# Patient Record
Sex: Female | Born: 1950 | Race: White | Hispanic: No | Smoking: Former smoker
Health system: Southern US, Community
[De-identification: ages and names within clinical notes are randomized; demographics above are authoritative.]

## PROBLEM LIST (undated history)

## (undated) DIAGNOSIS — R51 Headache: Secondary | ICD-10-CM

## (undated) DIAGNOSIS — I10 Essential (primary) hypertension: Secondary | ICD-10-CM

## (undated) DIAGNOSIS — B958 Unspecified staphylococcus as the cause of diseases classified elsewhere: Secondary | ICD-10-CM

## (undated) DIAGNOSIS — G2581 Restless legs syndrome: Secondary | ICD-10-CM

## (undated) DIAGNOSIS — R011 Cardiac murmur, unspecified: Secondary | ICD-10-CM

## (undated) DIAGNOSIS — E1142 Type 2 diabetes mellitus with diabetic polyneuropathy: Secondary | ICD-10-CM

## (undated) DIAGNOSIS — E119 Type 2 diabetes mellitus without complications: Secondary | ICD-10-CM

## (undated) DIAGNOSIS — J45909 Unspecified asthma, uncomplicated: Secondary | ICD-10-CM

## (undated) DIAGNOSIS — E785 Hyperlipidemia, unspecified: Secondary | ICD-10-CM

## (undated) DIAGNOSIS — R519 Headache, unspecified: Secondary | ICD-10-CM

## (undated) DIAGNOSIS — I2510 Atherosclerotic heart disease of native coronary artery without angina pectoris: Secondary | ICD-10-CM

## (undated) DIAGNOSIS — M545 Low back pain, unspecified: Secondary | ICD-10-CM

## (undated) DIAGNOSIS — M1990 Unspecified osteoarthritis, unspecified site: Secondary | ICD-10-CM

## (undated) DIAGNOSIS — D649 Anemia, unspecified: Secondary | ICD-10-CM

## (undated) DIAGNOSIS — G8929 Other chronic pain: Secondary | ICD-10-CM

## (undated) DIAGNOSIS — K219 Gastro-esophageal reflux disease without esophagitis: Secondary | ICD-10-CM

## (undated) DIAGNOSIS — K551 Chronic vascular disorders of intestine: Secondary | ICD-10-CM

## (undated) HISTORY — DX: Essential (primary) hypertension: I10

## (undated) HISTORY — PX: LAPAROSCOPIC CHOLECYSTECTOMY: SUR755

## (undated) HISTORY — DX: Hyperlipidemia, unspecified: E78.5

## (undated) HISTORY — PX: TONSILLECTOMY: SUR1361

## (undated) HISTORY — PX: CORONARY ANGIOPLASTY: SHX604

## (undated) HISTORY — PX: FRACTURE SURGERY: SHX138

## (undated) HISTORY — PX: ABDOMINAL HYSTERECTOMY: SHX81

---

## 2003-11-08 ENCOUNTER — Emergency Department (HOSPITAL_COMMUNITY): Admission: EM | Admit: 2003-11-08 | Discharge: 2003-11-08 | Payer: Self-pay

## 2004-11-04 ENCOUNTER — Emergency Department (HOSPITAL_COMMUNITY): Admission: EM | Admit: 2004-11-04 | Discharge: 2004-11-04 | Payer: Self-pay | Admitting: Emergency Medicine

## 2006-12-06 ENCOUNTER — Emergency Department (HOSPITAL_COMMUNITY): Admission: EM | Admit: 2006-12-06 | Discharge: 2006-12-06 | Payer: Self-pay | Admitting: Emergency Medicine

## 2008-01-24 ENCOUNTER — Ambulatory Visit: Payer: Self-pay | Admitting: *Deleted

## 2008-01-24 ENCOUNTER — Ambulatory Visit: Payer: Self-pay | Admitting: Internal Medicine

## 2008-01-30 ENCOUNTER — Ambulatory Visit (HOSPITAL_COMMUNITY): Admission: RE | Admit: 2008-01-30 | Discharge: 2008-01-30 | Payer: Self-pay | Admitting: Family Medicine

## 2008-04-25 ENCOUNTER — Ambulatory Visit: Payer: Self-pay | Admitting: Internal Medicine

## 2008-04-25 LAB — CONVERTED CEMR LAB
AST: 19 units/L (ref 0–37)
Albumin: 4.4 g/dL (ref 3.5–5.2)
Alkaline Phosphatase: 99 units/L (ref 39–117)
BUN: 13 mg/dL (ref 6–23)
Calcium: 9.2 mg/dL (ref 8.4–10.5)
Chloride: 106 meq/L (ref 96–112)
Glucose, Bld: 60 mg/dL — ABNORMAL LOW (ref 70–99)
HDL: 38 mg/dL — ABNORMAL LOW (ref >39)
LDL Cholesterol: 91 mg/dL (ref 0–99)
Potassium: 4.1 meq/L (ref 3.5–5.3)
Sodium: 141 meq/L (ref 135–145)
Total Protein: 6.5 g/dL (ref 6.0–8.3)
Triglycerides: 96 mg/dL (ref <150)

## 2008-05-03 ENCOUNTER — Ambulatory Visit: Payer: Self-pay | Admitting: Internal Medicine

## 2008-07-25 ENCOUNTER — Ambulatory Visit: Payer: Self-pay | Admitting: Internal Medicine

## 2008-09-12 ENCOUNTER — Ambulatory Visit: Payer: Self-pay | Admitting: Internal Medicine

## 2008-10-16 ENCOUNTER — Ambulatory Visit: Payer: Self-pay | Admitting: Internal Medicine

## 2009-01-15 ENCOUNTER — Ambulatory Visit: Payer: Self-pay | Admitting: Internal Medicine

## 2009-02-03 ENCOUNTER — Ambulatory Visit (HOSPITAL_COMMUNITY): Admission: RE | Admit: 2009-02-03 | Discharge: 2009-02-03 | Payer: Self-pay | Admitting: Internal Medicine

## 2009-02-13 ENCOUNTER — Ambulatory Visit: Payer: Self-pay | Admitting: Internal Medicine

## 2009-02-13 LAB — CONVERTED CEMR LAB
AST: 13 units/L (ref 0–37)
Albumin: 4.4 g/dL (ref 3.5–5.2)
Alkaline Phosphatase: 103 units/L (ref 39–117)
Indirect Bilirubin: 0.4 mg/dL (ref 0.0–0.9)
Total Bilirubin: 0.5 mg/dL (ref 0.3–1.2)

## 2009-02-20 ENCOUNTER — Ambulatory Visit (HOSPITAL_COMMUNITY): Admission: RE | Admit: 2009-02-20 | Discharge: 2009-02-20 | Payer: Self-pay | Admitting: Internal Medicine

## 2009-02-20 ENCOUNTER — Encounter: Payer: Self-pay | Admitting: Internal Medicine

## 2009-04-03 ENCOUNTER — Ambulatory Visit: Payer: Self-pay | Admitting: Internal Medicine

## 2009-04-04 ENCOUNTER — Emergency Department (HOSPITAL_COMMUNITY): Admission: EM | Admit: 2009-04-04 | Discharge: 2009-04-04 | Payer: Self-pay | Admitting: Emergency Medicine

## 2009-04-15 ENCOUNTER — Ambulatory Visit: Payer: Self-pay | Admitting: Internal Medicine

## 2009-04-29 ENCOUNTER — Ambulatory Visit: Payer: Self-pay | Admitting: Internal Medicine

## 2009-08-07 ENCOUNTER — Ambulatory Visit: Payer: Self-pay | Admitting: Internal Medicine

## 2009-08-07 LAB — CONVERTED CEMR LAB
BUN: 12 mg/dL (ref 6–23)
CO2: 21 meq/L (ref 19–32)
Chloride: 100 meq/L (ref 96–112)
Creatinine, Ser: 0.67 mg/dL (ref 0.40–1.20)
Glucose, Bld: 472 mg/dL — ABNORMAL HIGH (ref 70–99)
LDL Cholesterol: 102 mg/dL — ABNORMAL HIGH (ref 0–99)
Microalb, Ur: 0.5 mg/dL (ref 0.00–1.89)
Potassium: 4.1 meq/L (ref 3.5–5.3)
VLDL: 24 mg/dL (ref 0–40)

## 2009-08-11 ENCOUNTER — Ambulatory Visit: Payer: Self-pay | Admitting: Internal Medicine

## 2009-10-28 ENCOUNTER — Ambulatory Visit: Payer: Self-pay | Admitting: Internal Medicine

## 2009-11-20 ENCOUNTER — Inpatient Hospital Stay (HOSPITAL_COMMUNITY): Admission: EM | Admit: 2009-11-20 | Discharge: 2009-11-24 | Payer: Self-pay | Admitting: Emergency Medicine

## 2009-11-20 ENCOUNTER — Ambulatory Visit: Payer: Self-pay | Admitting: Pulmonary Disease

## 2009-12-04 ENCOUNTER — Ambulatory Visit: Payer: Self-pay | Admitting: Internal Medicine

## 2009-12-17 ENCOUNTER — Ambulatory Visit: Payer: Self-pay | Admitting: Internal Medicine

## 2010-01-27 ENCOUNTER — Ambulatory Visit: Payer: Self-pay | Admitting: Internal Medicine

## 2010-05-04 ENCOUNTER — Ambulatory Visit (HOSPITAL_COMMUNITY): Admission: RE | Admit: 2010-05-04 | Discharge: 2010-05-04 | Payer: Self-pay | Admitting: Internal Medicine

## 2010-05-04 ENCOUNTER — Ambulatory Visit: Payer: Self-pay | Admitting: Internal Medicine

## 2010-09-28 LAB — CBC
MCHC: 33.8 g/dL (ref 30.0–36.0)
MCV: 97.6 fL (ref 78.0–100.0)
Platelets: 221 10*3/uL (ref 150–400)
RBC: 3.67 MIL/uL — ABNORMAL LOW (ref 3.87–5.11)
RDW: 12.6 % (ref 11.5–15.5)

## 2010-09-28 LAB — GLUCOSE, CAPILLARY
Glucose-Capillary: 160 mg/dL — ABNORMAL HIGH (ref 70–99)
Glucose-Capillary: 172 mg/dL — ABNORMAL HIGH (ref 70–99)
Glucose-Capillary: 189 mg/dL — ABNORMAL HIGH (ref 70–99)
Glucose-Capillary: 265 mg/dL — ABNORMAL HIGH (ref 70–99)
Glucose-Capillary: 77 mg/dL (ref 70–99)

## 2010-09-29 LAB — POCT I-STAT 3, ART BLOOD GAS (G3+)
Acid-base deficit: 5 mmol/L — ABNORMAL HIGH (ref 0.0–2.0)
O2 Saturation: 93 %
TCO2: 20 mmol/L (ref 0–100)
pCO2 arterial: 32.8 mmHg — ABNORMAL LOW (ref 35.0–45.0)

## 2010-09-29 LAB — URINALYSIS, ROUTINE W REFLEX MICROSCOPIC
Glucose, UA: >1000 mg/dL — AB
Ketones, ur: 40 mg/dL — AB
Protein, ur: NEGATIVE mg/dL
pH: 5 (ref 5.0–8.0)

## 2010-09-29 LAB — LACTIC ACID, PLASMA: Lactic Acid, Venous: 1.7 mmol/L (ref 0.5–2.2)

## 2010-09-29 LAB — GLUCOSE, CAPILLARY
Glucose-Capillary: 128 mg/dL — ABNORMAL HIGH (ref 70–99)
Glucose-Capillary: 143 mg/dL — ABNORMAL HIGH (ref 70–99)
Glucose-Capillary: 228 mg/dL — ABNORMAL HIGH (ref 70–99)
Glucose-Capillary: 262 mg/dL — ABNORMAL HIGH (ref 70–99)
Glucose-Capillary: 279 mg/dL — ABNORMAL HIGH (ref 70–99)
Glucose-Capillary: 280 mg/dL — ABNORMAL HIGH (ref 70–99)
Glucose-Capillary: 302 mg/dL — ABNORMAL HIGH (ref 70–99)
Glucose-Capillary: 303 mg/dL — ABNORMAL HIGH (ref 70–99)
Glucose-Capillary: 340 mg/dL — ABNORMAL HIGH (ref 70–99)
Glucose-Capillary: 387 mg/dL — ABNORMAL HIGH (ref 70–99)
Glucose-Capillary: 464 mg/dL — ABNORMAL HIGH (ref 70–99)

## 2010-09-29 LAB — DIFFERENTIAL
Basophils Absolute: 0 10*3/uL (ref 0.0–0.1)
Basophils Absolute: 0 10*3/uL (ref 0.0–0.1)
Basophils Relative: 0 % (ref 0–1)
Eosinophils Absolute: 0 10*3/uL (ref 0.0–0.7)
Eosinophils Absolute: 0.1 10*3/uL (ref 0.0–0.7)
Eosinophils Relative: 1 % (ref 0–5)
Lymphocytes Relative: 13 % (ref 12–46)
Lymphocytes Relative: 6 % — ABNORMAL LOW (ref 12–46)
Lymphocytes Relative: 9 % — ABNORMAL LOW (ref 12–46)
Lymphs Abs: 1 10*3/uL (ref 0.7–4.0)
Lymphs Abs: 1.2 10*3/uL (ref 0.7–4.0)
Monocytes Absolute: 0.7 10*3/uL (ref 0.1–1.0)
Monocytes Relative: 4 % (ref 3–12)
Neutro Abs: 10.3 10*3/uL — ABNORMAL HIGH (ref 1.7–7.7)
Neutro Abs: 18.7 10*3/uL — ABNORMAL HIGH (ref 1.7–7.7)
Neutrophils Relative %: 88 % — ABNORMAL HIGH (ref 43–77)

## 2010-09-29 LAB — BASIC METABOLIC PANEL
BUN: 10 mg/dL (ref 6–23)
BUN: 14 mg/dL (ref 6–23)
BUN: 9 mg/dL (ref 6–23)
CO2: 18 mEq/L — ABNORMAL LOW (ref 19–32)
CO2: 22 mEq/L (ref 19–32)
Calcium: 8.4 mg/dL (ref 8.4–10.5)
Calcium: 8.8 mg/dL (ref 8.4–10.5)
Chloride: 110 mEq/L (ref 96–112)
Chloride: 111 mEq/L (ref 96–112)
Creatinine, Ser: 0.43 mg/dL (ref 0.4–1.2)
Creatinine, Ser: 0.59 mg/dL (ref 0.4–1.2)
GFR calc Af Amer: >60 mL/min (ref >60)
GFR calc Af Amer: >60 mL/min (ref >60)
GFR calc non Af Amer: >60 mL/min (ref >60)
GFR calc non Af Amer: >60 mL/min (ref >60)
Glucose, Bld: 105 mg/dL — ABNORMAL HIGH (ref 70–99)
Glucose, Bld: 440 mg/dL — ABNORMAL HIGH (ref 70–99)
Potassium: 3.8 mEq/L (ref 3.5–5.1)
Sodium: 128 mEq/L — ABNORMAL LOW (ref 135–145)
Sodium: 136 mEq/L (ref 135–145)
Sodium: 136 mEq/L (ref 135–145)

## 2010-09-29 LAB — CBC
HCT: 33.8 % — ABNORMAL LOW (ref 36.0–46.0)
Hemoglobin: 11.8 g/dL — ABNORMAL LOW (ref 12.0–15.0)
Hemoglobin: 14.3 g/dL (ref 12.0–15.0)
MCHC: 34.3 g/dL (ref 30.0–36.0)
MCHC: 35 g/dL (ref 30.0–36.0)
MCV: 96.2 fL (ref 78.0–100.0)
Platelets: 160 10*3/uL (ref 150–400)
Platelets: 202 10*3/uL (ref 150–400)
RBC: 3.81 MIL/uL — ABNORMAL LOW (ref 3.87–5.11)
RDW: 12.5 % (ref 11.5–15.5)
RDW: 12.6 % (ref 11.5–15.5)
WBC: 11.8 10*3/uL — ABNORMAL HIGH (ref 4.0–10.5)

## 2010-09-29 LAB — URINE CULTURE

## 2010-09-29 LAB — URINE MICROSCOPIC-ADD ON

## 2010-09-29 LAB — KETONES, QUALITATIVE

## 2010-09-29 LAB — POCT I-STAT 3, VENOUS BLOOD GAS (G3P V)
Acid-base deficit: 6 mmol/L — ABNORMAL HIGH (ref 0.0–2.0)
Bicarbonate: 21 mEq/L (ref 20.0–24.0)
pH, Ven: 7.282 (ref 7.250–7.300)
pO2, Ven: 20 mmHg — CL (ref 30.0–45.0)

## 2010-09-29 LAB — CULTURE, BLOOD (ROUTINE X 2)
Culture: NO GROWTH
Culture: NO GROWTH

## 2010-09-29 LAB — HEMOGLOBIN A1C: Hgb A1c MFr Bld: 13.9 % — ABNORMAL HIGH (ref <5.7)

## 2010-09-29 LAB — MRSA PCR SCREENING: MRSA by PCR: POSITIVE — AB

## 2010-09-29 LAB — CULTURE, ROUTINE-ABSCESS

## 2010-10-16 LAB — DIFFERENTIAL
Basophils Absolute: 0 10*3/uL (ref 0.0–0.1)
Eosinophils Relative: 1 % (ref 0–5)
Lymphocytes Relative: 24 % (ref 12–46)
Monocytes Absolute: 0.3 10*3/uL (ref 0.1–1.0)
Monocytes Relative: 6 % (ref 3–12)

## 2010-10-16 LAB — POCT I-STAT, CHEM 8
BUN: 11 mg/dL (ref 6–23)
Calcium, Ion: 1.19 mmol/L (ref 1.12–1.32)
Chloride: 98 mEq/L (ref 96–112)
Creatinine, Ser: 0.5 mg/dL (ref 0.4–1.2)
Glucose, Bld: 494 mg/dL — ABNORMAL HIGH (ref 70–99)
TCO2: 23 mmol/L (ref 0–100)

## 2010-10-16 LAB — CBC
HCT: 45.3 % (ref 36.0–46.0)
Hemoglobin: 15.8 g/dL — ABNORMAL HIGH (ref 12.0–15.0)
RBC: 4.78 MIL/uL (ref 3.87–5.11)
RDW: 12.2 % (ref 11.5–15.5)

## 2010-10-16 LAB — GLUCOSE, CAPILLARY: Glucose-Capillary: 299 mg/dL — ABNORMAL HIGH (ref 70–99)

## 2011-11-01 ENCOUNTER — Emergency Department (HOSPITAL_COMMUNITY): Payer: Self-pay

## 2011-11-01 ENCOUNTER — Emergency Department (HOSPITAL_COMMUNITY)
Admission: EM | Admit: 2011-11-01 | Discharge: 2011-11-01 | Disposition: A | Discharge status: Home / Self Care | Payer: Self-pay | Attending: Emergency Medicine | Admitting: Emergency Medicine

## 2011-11-01 ENCOUNTER — Encounter (HOSPITAL_COMMUNITY): Payer: Self-pay

## 2011-11-01 DIAGNOSIS — Z794 Long term (current) use of insulin: Secondary | ICD-10-CM | POA: Insufficient documentation

## 2011-11-01 DIAGNOSIS — E119 Type 2 diabetes mellitus without complications: Secondary | ICD-10-CM | POA: Insufficient documentation

## 2011-11-01 DIAGNOSIS — M7989 Other specified soft tissue disorders: Secondary | ICD-10-CM | POA: Insufficient documentation

## 2011-11-01 DIAGNOSIS — M25476 Effusion, unspecified foot: Secondary | ICD-10-CM | POA: Insufficient documentation

## 2011-11-01 DIAGNOSIS — M109 Gout, unspecified: Secondary | ICD-10-CM | POA: Insufficient documentation

## 2011-11-01 DIAGNOSIS — M25473 Effusion, unspecified ankle: Secondary | ICD-10-CM | POA: Insufficient documentation

## 2011-11-01 DIAGNOSIS — M79609 Pain in unspecified limb: Secondary | ICD-10-CM | POA: Insufficient documentation

## 2011-11-01 DIAGNOSIS — M25579 Pain in unspecified ankle and joints of unspecified foot: Secondary | ICD-10-CM | POA: Insufficient documentation

## 2011-11-01 DIAGNOSIS — Z79899 Other long term (current) drug therapy: Secondary | ICD-10-CM | POA: Insufficient documentation

## 2011-11-01 DIAGNOSIS — F17200 Nicotine dependence, unspecified, uncomplicated: Secondary | ICD-10-CM | POA: Insufficient documentation

## 2011-11-01 HISTORY — DX: Unspecified staphylococcus as the cause of diseases classified elsewhere: B95.8

## 2011-11-01 MED ORDER — COLCHICINE 0.6 MG PO TABS: ORAL_TABLET | ORAL | Status: DC

## 2011-11-01 MED ORDER — OXYCODONE-ACETAMINOPHEN 5-325 MG PO TABS: 1.0000 | ORAL_TABLET | ORAL | Status: AC | PRN

## 2011-11-01 NOTE — ED Provider Notes (Signed)
Medical screening examination/treatment/procedure(s) were performed by non-physician practitioner and as supervising physician I was immediately available for consultation/collaboration.   Celene Kras, MD 11/01/11 [phone]

## 2011-11-01 NOTE — Discharge Instructions (Signed)
Your x-ray is normal. I think your pain is due to a gout. Make sure to read and follow the information below about gout.Take colchicine as prescribed for pain. Take percocet as prescribed as needed for severe pain. Follow up with your doctor.   Gout Gout is an inflammatory condition (arthritis) caused by a buildup of uric acid crystals in the joints. Uric acid is a chemical that is normally present in the blood. Under some circumstances, uric acid can form into crystals in your joints. This causes joint redness, soreness, and swelling (inflammation). Repeat attacks are common. Over time, uric acid crystals can form into masses (tophi) near a joint, causing disfigurement. Gout is treatable and often preventable. CAUSES  The disease begins with elevated levels of uric acid in the blood. Uric acid is produced by your body when it breaks down a naturally found substance called purines. This also happens when you eat certain foods such as meats and fish. Causes of an elevated uric acid level include:  Being passed down from parent to child (heredity).   Diseases that cause increased uric acid production (obesity, psoriasis, some cancers).   Excessive alcohol use.   Diet, especially diets rich in meat and seafood.   Medicines, including certain cancer-fighting drugs (chemotherapy), diuretics, and aspirin.   Chronic kidney disease. The kidneys are no longer able to remove uric acid well.   Problems with metabolism.  Conditions strongly associated with gout include:  Obesity.   High blood pressure.   High cholesterol.   Diabetes.  Not everyone with elevated uric acid levels gets gout. It is not understood why some people get gout and others do not. Surgery, joint injury, and eating too much of certain foods are some of the factors that can lead to gout. SYMPTOMS   An attack of gout comes on quickly. It causes intense pain with redness, swelling, and warmth in a joint.   Fever can occur.    Often, only one joint is involved. Certain joints are more commonly involved:   Base of the big toe.   Knee.   Ankle.   Wrist.   Finger.  Without treatment, an attack usually goes away in a few days to weeks. Between attacks, you usually will not have symptoms, which is different from many other forms of arthritis. DIAGNOSIS  Your caregiver will suspect gout based on your symptoms and exam. Removal of fluid from the joint (arthrocentesis) is done to check for uric acid crystals. Your caregiver will give you a medicine that numbs the area (local anesthetic) and use a needle to remove joint fluid for exam. Gout is confirmed when uric acid crystals are seen in joint fluid, using a special microscope. Sometimes, blood, urine, and X-ray tests are also used. TREATMENT  There are 2 phases to gout treatment: treating the sudden onset (acute) attack and preventing attacks (prophylaxis). Treatment of an Acute Attack  Medicines are used. These include anti-inflammatory medicines or steroid medicines.   An injection of steroid medicine into the affected joint is sometimes necessary.   The painful joint is rested. Movement can worsen the arthritis.   You may use warm or cold treatments on painful joints, depending which works best for you.   Discuss the use of coffee, vitamin C, or cherries with your caregiver. These may be helpful treatment options.  Treatment to Prevent Attacks After the acute attack subsides, your caregiver may advise prophylactic medicine. These medicines either help your kidneys eliminate uric acid from your body  or decrease your uric acid production. You may need to stay on these medicines for a very long time. The early phase of treatment with prophylactic medicine can be associated with an increase in acute gout attacks. For this reason, during the first few months of treatment, your caregiver may also advise you to take medicines usually used for acute gout treatment. Be  sure you understand your caregiver's directions. You should also discuss dietary treatment with your caregiver. Certain foods such as meats and fish can increase uric acid levels. Other foods such as dairy can decrease levels. Your caregiver can give you a list of foods to avoid. HOME CARE INSTRUCTIONS   Do not take aspirin to relieve pain. This raises uric acid levels.   Only take over-the-counter or prescription medicines for pain, discomfort, or fever as directed by your caregiver.   Rest the joint as much as possible. When in bed, keep sheets and blankets off painful areas.   Keep the affected joint raised (elevated).   Use crutches if the painful joint is in your leg.   Drink enough water and fluids to keep your urine clear or pale yellow. This helps your body get rid of uric acid. Do not drink alcoholic beverages. They slow the passage of uric acid.   Follow your caregiver's dietary instructions. Pay careful attention to the amount of protein you eat. Your daily diet should emphasize fruits, vegetables, whole grains, and fat-free or low-fat milk products.   Maintain a healthy body weight.  SEEK MEDICAL CARE IF:   You have an oral temperature above 102 F (38.9 C).   You develop diarrhea, vomiting, or any side effects from medicines.   You do not feel better in 24 hours, or you are getting worse.  SEEK IMMEDIATE MEDICAL CARE IF:   Your joint becomes suddenly more tender and you have:   Chills.   An oral temperature above 102 F (38.9 C), not controlled by medicine.  MAKE SURE YOU:   Understand these instructions.   Will watch your condition.   Will get help right away if you are not doing well or get worse.  Document Released: 06/25/2000 Document Revised: 06/17/2011 Document Reviewed: 10/06/2009 [facility] Patient Information 2012 [place], [place].

## 2011-11-01 NOTE — ED Notes (Signed)
Patient reports that she has right toe pain, redness, and swelling.  Patient denies any injury.

## 2011-11-01 NOTE — Progress Notes (Signed)
ED Cm spoke with pt about low cost insulin and [email or web address] for [employer] to assist with lantus cost. Pt on lantus and novolog Seen by [facility] community liaison and accepted [facility] package [facility]11 at 3 or 4 pm and will be attending the appt.  Pt reports lost of weight to 160-70 lbs with diet and exercise PMH Dm Pt voiced understanding and appreciation of services and resources rendered

## 2011-11-01 NOTE — ED Provider Notes (Signed)
History     CSN: [id number]  Arrival date & time 11/01/11  1155   First MD Initiated Contact with Patient 11/01/11 1346      Chief Complaint  Patient presents with  . Toe Pain    (Consider location/radiation/quality/duration/timing/severity/associated sxs/prior treatment) Patient is a 61 y.o. female presenting with toe pain. The history is provided by the patient.  Toe Pain This is a new problem. The current episode started yesterday. The problem occurs constantly. The problem has been unchanged. Associated symptoms include arthralgias and joint swelling. Pertinent negatives include no chills, fever, nausea, numbness, rash, vomiting or weakness. The symptoms are aggravated by bending, twisting and walking. She has tried ice for the symptoms. The treatment provided no relief.  Pt noted that her right great toe got swollen yesterday, it is red, tender to the touch, and pain with movement. No injuries. No fever, chills, malaise. No hx of the same. Did not take any medications.   Past Medical History  Diagnosis Date  . Diabetes mellitus   . Staph infection     History reviewed. No pertinent past surgical history.  Family History  Problem Relation Age of Onset  . Diabetes Mother   . Cancer Father   . Diabetes Sister   . Hypertension Sister   . Cancer Brother     History  Substance Use Topics  . Smoking status: Current Everyday Smoker -- 1.0 packs/day  . Smokeless tobacco: Never Used  . Alcohol Use: No    OB History    Grav Para Term Preterm Abortions TAB SAB Ect Mult Living                  Review of Systems  Constitutional: Negative for fever and chills.  HENT: Negative.   Respiratory: Negative.   Cardiovascular: Negative.   Gastrointestinal: Negative for nausea and vomiting.  Musculoskeletal: Positive for joint swelling and arthralgias.  Skin: Negative.  Negative for rash.  Neurological: Negative for weakness and numbness.  Psychiatric/Behavioral: Negative.      Allergies  Review of patient's allergies indicates no known allergies.  Home Medications   Current Outpatient Rx  Name Route Sig Dispense Refill  . ACETAMINOPHEN 500 MG PO TABS Oral Take 500 mg by mouth every 6 (six) hours as needed. pain    . ATORVASTATIN CALCIUM 20 MG PO TABS Oral Take 20 mg by mouth daily.    . INSULIN ASPART 100 UNIT/ML [place] SOLN Subcutaneous Inject 5 Units into the skin 3 (three) times daily before meals.    . INSULIN GLARGINE 100 UNIT/ML [place] SOLN Subcutaneous Inject 40 Units into the skin 2 (two) times daily.    Marland Kitchen METFORMIN HCL 500 MG PO TABS Oral Take 500 mg by mouth 2 (two) times daily with a meal.    . OVER THE COUNTER MEDICATION Oral Take 1 tablet by mouth at bedtime.      BP 103/68  Pulse 107  Temp(Src) 99 F (37.2 C) (Oral)  Resp 18  SpO2 97%  Physical Exam  Nursing note and vitals reviewed. Constitutional: She is oriented to person, place, and time. She appears well-developed and well-nourished. No distress.  HENT:  Head: Normocephalic and atraumatic.  Eyes: Conjunctivae are normal.  Neck: Neck supple.  Cardiovascular: Normal rate, regular rhythm and normal heart sounds.   Pulmonary/Chest: Effort normal and breath sounds normal. No respiratory distress. She has no wheezes.  Musculoskeletal: Normal range of motion.       Swelling over the MTP joint  of the right foot. Tender to palpation over that joint. Pain with flexion, extension of the joint. Tender with even light touch.   Neurological: She is alert and oriented to person, place, and time.  Skin: Skin is warm and dry. There is erythema.  Psychiatric: She has a normal mood and affect.    ED Course  Procedures (including critical care time)  Pt with right great toe pain, acute onset, yesterday. No injuries. Will get an x-ray.   Dg Foot Complete Right  11/01/2011  *RADIOLOGY REPORT*  Clinical Data: Pain and swelling of the foot.  RIGHT FOOT COMPLETE - 3+ VIEW  Comparison: None.  Findings:  No evidence for an acute fracture.  No subluxation or dislocation.  No worrisome lytic or sclerotic osseous abnormality. Hallux valgus deformity is noted and there are degenerative changes at the first MTP joint.  IMPRESSION: No acute bony findings.  Original Report Authenticated By: ERIC A. MANSELL, M.D.   X-ray negative. Pt afebrile, no signs of infection, pain with even light touch, and movement, over right MTP joint of great toe. Suspect possible gout. Will treat. Will follow up with pcp for further evaluation and treatment. Return if worsening or develop fever.   1. Gout of big toe       MDM          Lottie Mussel, PA 11/01/11 1551

## 2012-06-05 ENCOUNTER — Encounter (HOSPITAL_COMMUNITY): Payer: Self-pay

## 2012-06-05 ENCOUNTER — Emergency Department (HOSPITAL_COMMUNITY)
Admission: EM | Admit: 2012-06-05 | Discharge: 2012-06-05 | Disposition: A | Discharge status: Home / Self Care | Payer: Self-pay | Source: Home / Self Care

## 2012-06-05 DIAGNOSIS — L02439 Carbuncle of limb, unspecified: Secondary | ICD-10-CM

## 2012-06-05 DIAGNOSIS — L02429 Furuncle of limb, unspecified: Secondary | ICD-10-CM

## 2012-06-05 DIAGNOSIS — E119 Type 2 diabetes mellitus without complications: Secondary | ICD-10-CM

## 2012-06-05 DIAGNOSIS — I10 Essential (primary) hypertension: Secondary | ICD-10-CM

## 2012-06-05 DIAGNOSIS — Z23 Encounter for immunization: Secondary | ICD-10-CM

## 2012-06-05 DIAGNOSIS — E785 Hyperlipidemia, unspecified: Secondary | ICD-10-CM

## 2012-06-05 MED ORDER — INSULIN ASPART 100 UNIT/ML ~~LOC~~ SOLN
SUBCUTANEOUS | Status: AC
  Filled 2012-06-05: qty 1

## 2012-06-05 MED ORDER — INFLUENZA VIRUS VACC SPLIT PF IM SUSP
0.5000 mL | Freq: Once | INTRAMUSCULAR | Status: AC
  Administered 2012-06-05: 0.5 mL via INTRAMUSCULAR

## 2012-06-05 MED ORDER — BENAZEPRIL HCL 10 MG PO TABS: 10.0000 mg | ORAL_TABLET | Freq: Every day | ORAL | Status: DC

## 2012-06-05 MED ORDER — QUETIAPINE FUMARATE 50 MG PO TABS: 50.0000 mg | ORAL_TABLET | Freq: Every day | ORAL | Status: DC

## 2012-06-05 MED ORDER — METFORMIN HCL ER 500 MG PO TB24: 500.0000 mg | ORAL_TABLET | Freq: Two times a day (BID) | ORAL | Status: DC

## 2012-06-05 MED ORDER — INSULIN ASPART 100 UNIT/ML ~~LOC~~ SOLN
12.0000 [IU] | Freq: Once | SUBCUTANEOUS | Status: AC
  Administered 2012-06-05: 12 [IU] via SUBCUTANEOUS

## 2012-06-05 MED ORDER — INSULIN ASPART 100 UNIT/ML ~~LOC~~ SOLN: 5.0000 [IU] | Freq: Three times a day (TID) | SUBCUTANEOUS | Status: DC

## 2012-06-05 MED ORDER — ATORVASTATIN CALCIUM 20 MG PO TABS: 20.0000 mg | ORAL_TABLET | Freq: Every day | ORAL | Status: DC

## 2012-06-05 MED ORDER — SULFAMETHOXAZOLE-TMP DS 800-160 MG PO TABS: 1.0000 | ORAL_TABLET | Freq: Two times a day (BID) | ORAL | Status: DC

## 2012-06-05 MED ORDER — INSULIN GLARGINE 100 UNIT/ML ~~LOC~~ SOLN: 60.0000 [IU] | Freq: Two times a day (BID) | SUBCUTANEOUS | Status: DC

## 2012-06-05 NOTE — ED Notes (Signed)
Patient states has not taken any of her medications except insulin since health care closed.

## 2012-06-05 NOTE — ED Provider Notes (Signed)
History     CSN: [id number]  Arrival date & time 06/05/12  1547    Chief Complaint  Patient presents with  . Abdominal Pain    pain for 3 months  . Recurrent Skin Infections    bilateral armpits  . Weight Loss    was 230 lbs now down to 154 in the last 6 months    HPI Pt says she is out of all of her medications.  She is almost out of insulin and needs refills of everything.  She is reporting an abscess under her left axilla that is swollen and tender.  She denies fever and chills. She has a history of MRSA infection that was severe and had to have surgery and ICU hospitalization.  She is trying to get medicaid benefits.   She has not been able to test her BS because of no insurance and no money for test strips.   Past Medical History  Diagnosis Date  . Diabetes mellitus   . Staph infection     History reviewed. No pertinent past surgical history.  Family History  Problem Relation Age of Onset  . Diabetes Mother   . Cancer Father   . Diabetes Sister   . Hypertension Sister   . Cancer Brother     History  Substance Use Topics  . Smoking status: Current Every Day Smoker -- 1.0 packs/day  . Smokeless tobacco: Never Used  . Alcohol Use: No    OB History    Grav Para Term Preterm Abortions TAB SAB Ect Mult Living                  Review of Systems  Constitutional: Negative.   HENT: Negative.   Eyes: Negative.   Respiratory: Negative.   Cardiovascular: Negative.   Gastrointestinal: Negative.   Genitourinary: Positive for urgency and frequency.  Musculoskeletal:       Axillary abscesses bilateral  Neurological: Negative.   Psychiatric/Behavioral: Negative.     Allergies  Review of patient's allergies indicates no known allergies.  Home Medications   Current Outpatient Rx  Name  Route  Sig  Dispense  Refill  . ACETAMINOPHEN 500 MG PO TABS   Oral   Take 500 mg by mouth every 6 (six) hours as needed. pain         . INSULIN ASPART 100 UNIT/ML [place]  SOLN   Subcutaneous   Inject 5 Units into the skin 3 (three) times daily before meals.         . INSULIN GLARGINE 100 UNIT/ML [place] SOLN   Subcutaneous   Inject 40 Units into the skin 2 (two) times daily.         . ATORVASTATIN CALCIUM 20 MG PO TABS   Oral   Take 20 mg by mouth daily.         Marland Kitchen BENAZEPRIL HCL 10 MG PO TABS   Oral   Take 10 mg by mouth daily.         . COLCHICINE 0.6 MG PO TABS      Take two tab PO, then take 1 tab PO 1 hour later   6 tablet   0   . METFORMIN HCL 500 MG PO TABS   Oral   Take 500 mg by mouth 2 (two) times daily with a meal.         . OVER THE COUNTER MEDICATION   Oral   Take 1 tablet by mouth at bedtime.         Marland Kitchen  QUETIAPINE FUMARATE 50 MG PO TABS   Oral   Take 50 mg by mouth at bedtime. XR           BP 143/82  Pulse 82  Temp 98.1 F (36.7 C) (Oral)  Resp 20  SpO2 96%  Physical Exam  Constitutional: She is oriented to person, place, and time. She appears well-developed and well-nourished. She appears distressed.  HENT:  Head: Normocephalic and atraumatic.  Nose: Nose normal.  Mouth/Throat: No oropharyngeal exudate.  Eyes: Pupils are equal, round, and reactive to light. Right eye exhibits no discharge.  Neck: Normal range of motion. Neck supple. No JVD present. No thyromegaly present.  Cardiovascular: Normal rate, regular rhythm and normal heart sounds.   Pulmonary/Chest: Effort normal and breath sounds normal.  Abdominal: Soft. Bowel sounds are normal.  Musculoskeletal: Normal range of motion.       Large left axillary abscess fluctuant, multiple small abscesses under left and right axilla   Lymphadenopathy:    She has no cervical adenopathy.  Neurological: She is alert and oriented to person, place, and time. No cranial nerve deficit.  Skin: Skin is warm and dry.  Psychiatric: She has a normal mood and affect. Her behavior is normal. Judgment and thought content normal.    ED Course  INCISION AND  DRAINAGE Date/Time: 06/05/2012 6:12 PM Performed by: Cleora Fleet Authorized by: Cleora Fleet Consent: Verbal consent obtained. Risks and benefits: risks, benefits and alternatives were discussed Consent given by: patient Patient understanding: patient states understanding of the procedure being performed Patient consent: the patient's understanding of the procedure matches consent given Relevant documents: relevant documents present and verified Site marked: the operative site was marked Required items: required blood products, implants, devices, and special equipment available Patient identity confirmed: verbally with patient Type: abscess Location: left axilla. Anesthesia: local infiltration Local anesthetic: lidocaine 2% without epinephrine Anesthetic total: 3 ml Patient sedated: no Scalpel size: 10 Needle gauge: 22 Incision type: single straight Complexity: simple Drainage: purulent Drainage amount: copious Wound treatment: drain placed Packing material: 1/4 in iodoform gauze Patient tolerance: Patient tolerated the procedure well with no immediate complications.   (including critical care time)  Labs Reviewed - No data to display No results found.   No diagnosis found.    MDM  IMPRESSION  Type 2 dm  MRSA abscesses  Left axillary abscess  Hypertension  RECOMMENDATIONS / PLAN  I&D of abscess as documented Novolog 12 units sub cut Refilled diabetes medications and all regular medications Influenza Vaccine  FOLLOW UP 1 day for wound check  The patient was given clear instructions to go to ER or return to medical center if symptoms don't improve, worsen or new problems develop.  The patient verbalized understanding.  The patient was told to call to get lab results if they haven't heard anything in the next week.            Cleora Fleet, MD 06/05/12 2117

## 2012-06-05 NOTE — ED Notes (Signed)
Patient states here for abd pain times 3months located under navel area. C/o Boils under bilateral armpits.Patient has also complained of large weight loss in the past 6 months.  States 6 months ago weighed 230 pounds now down to 154 pounds as of this am.  Boil under L arm is actively draining at this time.Patient states pain scale today is #6 on NS scale

## 2012-06-06 ENCOUNTER — Emergency Department (INDEPENDENT_AMBULATORY_CARE_PROVIDER_SITE_OTHER)
Admission: EM | Admit: 2012-06-06 | Discharge: 2012-06-06 | Disposition: A | Discharge status: Home / Self Care | Payer: Self-pay | Source: Home / Self Care

## 2012-06-06 DIAGNOSIS — Z22322 Carrier or suspected carrier of Methicillin resistant Staphylococcus aureus: Secondary | ICD-10-CM

## 2012-06-06 DIAGNOSIS — L0291 Cutaneous abscess, unspecified: Secondary | ICD-10-CM

## 2012-06-06 DIAGNOSIS — Z4800 Encounter for change or removal of nonsurgical wound dressing: Secondary | ICD-10-CM

## 2012-06-06 NOTE — ED Notes (Signed)
Patient here for follow up-  Boil under left arm

## 2012-06-06 NOTE — ED Provider Notes (Signed)
History     CSN: [id number]  Arrival date & time 06/06/12  1520   None     Chief Complaint  Patient presents with  . Follow-up    remove packing from boil under left arm    HPI The patient reports that she is doing much better.  She has some tenderness in the area and some mild drainage from the area where the wound is packed but otherwise has been doing good.  She started her antibiotics and she reports that she's tolerating them very well.  The patient also reports she's having chronic abdominal pain associated with eating.  She reports that she's not having reflux symptoms.  She denies constipation diarrhea.  She reports that she has never had a colonoscopy test.  She's never had further GI workup at this point.   Past Medical History  Diagnosis Date  . Diabetes mellitus   . Staph infection     No past surgical history on file.  Family History  Problem Relation Age of Onset  . Diabetes Mother   . Cancer Father   . Diabetes Sister   . Hypertension Sister   . Cancer Brother     History  Substance Use Topics  . Smoking status: Current Every Day Smoker -- 1.0 packs/day  . Smokeless tobacco: Never Used  . Alcohol Use: No    OB History    Grav Para Term Preterm Abortions TAB SAB Ect Mult Living                  Review of Systems  Constitutional: Negative.   HENT: Negative.   Eyes: Negative.   Respiratory: Negative.   Cardiovascular: Negative.   Gastrointestinal: Negative for abdominal distention.       Abdominal pain associated with eating  Genitourinary: Negative.   Musculoskeletal: Negative.   Neurological: Negative.   Psychiatric/Behavioral: Negative.     Allergies  Review of patient's allergies indicates no known allergies.  Home Medications   Current Outpatient Rx  Name  Route  Sig  Dispense  Refill  . ATORVASTATIN CALCIUM 20 MG PO TABS   Oral   Take 1 tablet (20 mg total) by mouth daily.   30 tablet   5   . BENAZEPRIL HCL 10 MG PO TABS  Oral   Take 1 tablet (10 mg total) by mouth daily.   30 tablet   5   . INSULIN ASPART 100 UNIT/ML [place] SOLN   Subcutaneous   Inject 5 Units into the skin 3 (three) times daily before meals.   1 vial   5   . INSULIN GLARGINE 100 UNIT/ML  SOLN   Subcutaneous   Inject 60 Units into the skin 2 (two) times daily.   10 mL   5   . METFORMIN HCL ER 500 MG PO TB24   Oral   Take 1 tablet (500 mg total) by mouth 2 (two) times daily with a meal.   60 tablet   5   . QUETIAPINE FUMARATE 50 MG PO TABS   Oral   Take 1 tablet (50 mg total) by mouth at bedtime. XR   30 tablet   5   . SULFAMETHOXAZOLE-TMP DS 800-160 MG PO TABS   Oral   Take 1 tablet by mouth 2 (two) times daily.   20 tablet   0     BP 117/90  Pulse 99  Temp 98.6 F (37 C) (Oral)  Resp 19  SpO2 99%  Physical  Exam  Constitutional: She is oriented to person, place, and time. She appears well-developed and well-nourished. No distress.  Pulmonary/Chest: Effort normal and breath sounds normal.  Abdominal: Soft. Bowel sounds are normal. She exhibits no distension and no mass. There is no tenderness. There is no rebound and no guarding.  Musculoskeletal:       Well-healing incision and drainage area under the left axilla, much less redness and irritation, the wound was repacked today  Neurological: She is alert and oriented to person, place, and time.  Skin: Skin is warm.  Psychiatric: She has a normal mood and affect. Her behavior is normal. Judgment and thought content normal.    ED Course  Procedures (including critical care time)  Labs Reviewed - No data to display No results found.  No diagnosis found.  MDM  MRSA status post I&D of left axillary abscess on 11/25 Abdominal Pain-chronic GI consult and Colonoscopy recommended  MRSA Abscess - left axilla Healing well, packed again today Continue Bactrim double strength by mouth twice a day until completed The patient was given instructions to remove the  packing tomorrow but no further repacking after that The patient's never had a colonoscopy your GI workup ensued she is continuing to have this abdominal pain I am going to request a GI consult for the patient. Followup in one month for diabetes and hypertension followup  The patient was given clear instructions to go to ER or return to medical center if symptoms don't improve, worsen or new problems develop.  The patient verbalized understanding.  The patient was told to call to get lab results if they haven't heard anything in the next week.        Cleora Fleet, MD 06/06/12 1620

## 2012-06-08 LAB — CULTURE, ROUTINE-ABSCESS

## 2012-06-12 ENCOUNTER — Telehealth (HOSPITAL_COMMUNITY): Payer: Self-pay

## 2012-06-12 NOTE — Telephone Encounter (Signed)
Message copied by Lestine Mount on Mon Jun 12, 2012 12:26 PM ------      Message from: Cleora Fleet      Created: Thu Jun 08, 2012 12:28 PM       Please notify.  Results consistent with Staph infection but no MRSA was found.  Please finish meds as prescribed.              Rodney Langton, MD, CDE, FAAFP      [facility]      [facility]      [place], [place]

## 2012-07-27 NOTE — ED Notes (Signed)
Referral to [facility] gi referral-no specialist

## 2013-06-08 ENCOUNTER — Emergency Department (HOSPITAL_COMMUNITY)
Admission: EM | Admit: 2013-06-08 | Discharge: 2013-06-09 | Disposition: A | Discharge status: Home / Self Care | Payer: Medicaid Other | Attending: Emergency Medicine | Admitting: Emergency Medicine

## 2013-06-08 ENCOUNTER — Encounter (HOSPITAL_COMMUNITY): Payer: Self-pay | Admitting: Emergency Medicine

## 2013-06-08 DIAGNOSIS — Z9089 Acquired absence of other organs: Secondary | ICD-10-CM | POA: Insufficient documentation

## 2013-06-08 DIAGNOSIS — Z90710 Acquired absence of both cervix and uterus: Secondary | ICD-10-CM | POA: Insufficient documentation

## 2013-06-08 DIAGNOSIS — R197 Diarrhea, unspecified: Secondary | ICD-10-CM | POA: Insufficient documentation

## 2013-06-08 DIAGNOSIS — M549 Dorsalgia, unspecified: Secondary | ICD-10-CM | POA: Insufficient documentation

## 2013-06-08 DIAGNOSIS — R739 Hyperglycemia, unspecified: Secondary | ICD-10-CM

## 2013-06-08 DIAGNOSIS — E119 Type 2 diabetes mellitus without complications: Secondary | ICD-10-CM | POA: Insufficient documentation

## 2013-06-08 DIAGNOSIS — Z8619 Personal history of other infectious and parasitic diseases: Secondary | ICD-10-CM | POA: Insufficient documentation

## 2013-06-08 DIAGNOSIS — Z79899 Other long term (current) drug therapy: Secondary | ICD-10-CM | POA: Insufficient documentation

## 2013-06-08 DIAGNOSIS — N390 Urinary tract infection, site not specified: Secondary | ICD-10-CM | POA: Insufficient documentation

## 2013-06-08 DIAGNOSIS — Z794 Long term (current) use of insulin: Secondary | ICD-10-CM | POA: Insufficient documentation

## 2013-06-08 DIAGNOSIS — F17200 Nicotine dependence, unspecified, uncomplicated: Secondary | ICD-10-CM | POA: Insufficient documentation

## 2013-06-08 LAB — COMPREHENSIVE METABOLIC PANEL WITH GFR
ALT: 8 U/L (ref 0–35)
Alkaline Phosphatase: 107 U/L (ref 39–117)
BUN: 13 mg/dL (ref 6–23)
CO2: 25 meq/L (ref 19–32)
Calcium: 9.3 mg/dL (ref 8.4–10.5)
GFR calc Af Amer: >90 mL/min (ref >90)
GFR calc non Af Amer: >90 mL/min (ref >90)
Glucose, Bld: 261 mg/dL — ABNORMAL HIGH (ref 70–99)
Potassium: 3.9 meq/L (ref 3.5–5.1)
Sodium: 135 meq/L (ref 135–145)

## 2013-06-08 LAB — CBC WITH DIFFERENTIAL/PLATELET
Basophils Absolute: 0 10*3/uL (ref 0.0–0.1)
Basophils Relative: 0 % (ref 0–1)
Eosinophils Absolute: 0.1 K/uL (ref 0.0–0.7)
Eosinophils Relative: 1 % (ref 0–5)
HCT: 38.9 % (ref 36.0–46.0)
Hemoglobin: 13.9 g/dL (ref 12.0–15.0)
Lymphocytes Relative: 31 % (ref 12–46)
Lymphs Abs: 3.9 K/uL (ref 0.7–4.0)
MCH: 32.6 pg (ref 26.0–34.0)
MCHC: 35.7 g/dL (ref 30.0–36.0)
MCV: 91.3 fL (ref 78.0–100.0)
Monocytes Absolute: 0.7 10*3/uL (ref 0.1–1.0)
Monocytes Relative: 5 % (ref 3–12)
Neutro Abs: 7.6 10*3/uL (ref 1.7–7.7)
Neutrophils Relative %: 62 % (ref 43–77)
Platelets: 278 K/uL (ref 150–400)
RBC: 4.26 MIL/uL (ref 3.87–5.11)
RDW: 13.2 % (ref 11.5–15.5)
WBC: 12.3 K/uL — ABNORMAL HIGH (ref 4.0–10.5)

## 2013-06-08 LAB — URINALYSIS, ROUTINE W REFLEX MICROSCOPIC
Glucose, UA: >1000 mg/dL — AB
Hgb urine dipstick: NEGATIVE
Ketones, ur: 15 mg/dL — AB
Nitrite: POSITIVE — AB
Protein, ur: 30 mg/dL — AB
Specific Gravity, Urine: 1.029 (ref 1.005–1.030)
Urobilinogen, UA: 1 mg/dL (ref 0.0–1.0)
pH: 5 (ref 5.0–8.0)

## 2013-06-08 LAB — COMPREHENSIVE METABOLIC PANEL
AST: 11 U/L (ref 0–37)
Albumin: 3.5 g/dL (ref 3.5–5.2)
Chloride: 98 mEq/L (ref 96–112)
Creatinine, Ser: 0.41 mg/dL — ABNORMAL LOW (ref 0.50–1.10)
Total Bilirubin: 0.2 mg/dL — ABNORMAL LOW (ref 0.3–1.2)
Total Protein: 6.6 g/dL (ref 6.0–8.3)

## 2013-06-08 LAB — URINE MICROSCOPIC-ADD ON

## 2013-06-08 MED ORDER — MORPHINE SULFATE 4 MG/ML IJ SOLN
4.0000 mg | Freq: Once | INTRAMUSCULAR | Status: AC
  Administered 2013-06-08: 4 mg via INTRAVENOUS
  Filled 2013-06-08: qty 1

## 2013-06-08 MED ORDER — DEXTROSE 5 % IV SOLN
1.0000 g | Freq: Once | INTRAVENOUS | Status: AC
  Administered 2013-06-08: 1 g via INTRAVENOUS
  Filled 2013-06-08: qty 10

## 2013-06-08 MED ORDER — METOCLOPRAMIDE HCL 5 MG/ML IJ SOLN
10.0000 mg | Freq: Once | INTRAMUSCULAR | Status: AC
  Administered 2013-06-08: 10 mg via INTRAVENOUS
  Filled 2013-06-08: qty 2

## 2013-06-08 MED ORDER — SODIUM CHLORIDE 0.9 % IV BOLUS (SEPSIS)
1000.0000 mL | Freq: Once | INTRAVENOUS | Status: AC
  Administered 2013-06-08: 1000 mL via INTRAVENOUS

## 2013-06-08 NOTE — ED Notes (Addendum)
C/o mid lower back and low mid abd pain, intermitent, comes and goes, onset > 1 month ago, worse with eating. (denies: nv, fever, bleeding, urinary or vaginal sx). Took to AZO-like meds w/o relief. Urine is dark yellow. Also mentions diarrhea, last BM yesterday (watery).

## 2013-06-08 NOTE — ED Notes (Signed)
Pt reports lower abdominal and mid back pain x 1 month. States that she thinks she has a UTI and has been taking Azo tablets for urinary urgency and pain but it has not helped the pain.

## 2013-06-08 NOTE — ED Provider Notes (Signed)
CSN: [id number]     Arrival date & time 06/08/13  2043 History   First MD Initiated Contact with Patient 06/08/13 2144     Chief Complaint  Patient presents with  . Abdominal Pain   HPI  History provided by the patient. Patient is a 62 year old woman with history of diabetes, cholecystectomy and total hysterectomy who presents with complaints of worsening lower abdomen pain. Patient states that she has been having occasional lower abdominal pain and cramps for the past 2 weeks or more. She does state that the symptoms seem worse after eating. Over the past few days her pain has been significantly worse and episodes are lasting much longer period yesterday while trying to cook Thanksgiving meal she reports that she had to stop frequently due to discomfort. The pain also radiated into the low back area. Patient also reports having up to 6 watery diarrhea BMs yesterday without blood or mucus. She reports some decreased appetite but denies nausea or vomiting. She denies any fever, chills or sweats. Patient was unsure what might be causing her symptoms and did take 2 over-the-counter medications for urinary symptoms prior to arrival. She denies any change in symptoms. She denies any dysuria, hematuria or urinary frequency. No other aggravating or alleviating factors. No other associated symptoms.    Past Medical History  Diagnosis Date  . Diabetes mellitus   . Staph infection    Past Surgical History  Procedure Laterality Date  . Cholecystectomy    . Abdominal hysterectomy     Family History  Problem Relation Age of Onset  . Diabetes Mother   . Cancer Father   . Diabetes Sister   . Hypertension Sister   . Cancer Brother    History  Substance Use Topics  . Smoking status: Current Every Day Smoker -- 1.00 packs/day  . Smokeless tobacco: Never Used  . Alcohol Use: No   OB History   Grav Para Term Preterm Abortions TAB SAB Ect Mult Living                 Review of Systems   Constitutional: Positive for appetite change. Negative for fever, chills, diaphoresis and fatigue.  Respiratory: Negative for shortness of breath.   Cardiovascular: Negative for chest pain.  Gastrointestinal: Positive for abdominal pain and diarrhea. Negative for nausea, vomiting, constipation and blood in stool.  Genitourinary: Negative for dysuria, frequency, hematuria, flank pain, vaginal bleeding and vaginal discharge.  Musculoskeletal: Positive for back pain.  All other systems reviewed and are negative.    Allergies  Review of patient's allergies indicates no known allergies.  Home Medications   Current Outpatient Rx  Name  Route  Sig  Dispense  Refill  . atorvastatin (LIPITOR) 20 MG tablet   Oral   Take 1 tablet (20 mg total) by mouth daily.   30 tablet   5   . benazepril (LOTENSIN) 10 MG tablet   Oral   Take 1 tablet (10 mg total) by mouth daily.   30 tablet   5   . insulin aspart (NOVOLOG) 100 UNIT/ML injection   Subcutaneous   Inject 5 Units into the skin 3 (three) times daily before meals.   1 vial   5   . insulin glargine (LANTUS) 100 UNIT/ML injection   Subcutaneous   Inject 60 Units into the skin 2 (two) times daily.   10 mL   5   . metFORMIN (GLUCOPHAGE XR) 500 MG 24 hr tablet   Oral  Take 1 tablet (500 mg total) by mouth 2 (two) times daily with a meal.   60 tablet   5   . QUEtiapine (SEROQUEL) 50 MG tablet   Oral   Take 1 tablet (50 mg total) by mouth at bedtime. XR   30 tablet   5    BP 159/104  Pulse 102  Temp(Src) 98.2 F (36.8 C) (Oral)  Resp 18  SpO2 98% Physical Exam  Nursing note and vitals reviewed. Constitutional: She is oriented to person, place, and time. She appears well-developed and well-nourished. No distress.  HENT:  Head: Normocephalic and atraumatic.  Eyes: Conjunctivae are normal.  Cardiovascular: Normal rate and regular rhythm.   Pulmonary/Chest: Effort normal and breath sounds normal. No respiratory distress.  She has no wheezes. She has no rales.  Abdominal: Soft. There is tenderness in the right lower quadrant, suprapubic area and left lower quadrant. There is no rigidity, no rebound, no guarding, no CVA tenderness, no tenderness at McBurney's point and negative Murphy's sign.  Musculoskeletal: Normal range of motion.  Neurological: She is alert and oriented to person, place, and time.  Skin: Skin is warm and dry. No rash noted.  Psychiatric: She has a normal mood and affect. Her behavior is normal.    ED Course  Procedures   DIAGNOSTIC STUDIES: Oxygen Saturation is 98% on room air.    COORDINATION OF CARE:  Nursing notes reviewed. Vital signs reviewed. Initial pt interview and examination performed.   9:56 PM-patient seen and evaluated. Patient appears in discomfort but no acute distress. Denies any recent travel or known sick contacts. Discussed work up plan with pt at bedside, which includes lab testing and UA. Pt agrees with plan.  UA shows signs of a significant UTI. We'll give dose of antibiotics here. Patient already reports significant improvement after medications. We discussed continued outpatient treatment plan and she agrees.   Results for orders placed during the hospital encounter of 06/08/13  URINALYSIS, ROUTINE W REFLEX MICROSCOPIC      Result Value Range   Color, Urine RED (*) YELLOW   APPearance CLOUDY (*) CLEAR   Specific Gravity, Urine 1.029  1.005 - 1.030   pH 5.0  5.0 - 8.0   Glucose, UA >1000 (*) NEGATIVE mg/dL   Hgb urine dipstick NEGATIVE  NEGATIVE   Bilirubin Urine SMALL (*) NEGATIVE   Ketones, ur 15 (*) NEGATIVE mg/dL   Protein, ur 30 (*) NEGATIVE mg/dL   Urobilinogen, UA 1.0  0.0 - 1.0 mg/dL   Nitrite POSITIVE (*) NEGATIVE   Leukocytes, UA LARGE (*) NEGATIVE  CBC WITH DIFFERENTIAL      Result Value Range   WBC 12.3 (*) 4.0 - 10.5 K/uL   RBC 4.26  3.87 - 5.11 MIL/uL   Hemoglobin 13.9  12.0 - 15.0 g/dL   HCT 09.8  11.9 - 14.7 %   MCV 91.3  78.0 -  100.0 fL   MCH 32.6  26.0 - 34.0 pg   MCHC 35.7  30.0 - 36.0 g/dL   RDW 82.9  56.2 - 13.0 %   Platelets 278  150 - 400 K/uL   Neutrophils Relative % 62  43 - 77 %   Neutro Abs 7.6  1.7 - 7.7 K/uL   Lymphocytes Relative 31  12 - 46 %   Lymphs Abs 3.9  0.7 - 4.0 K/uL   Monocytes Relative 5  3 - 12 %   Monocytes Absolute 0.7  0.1 - 1.0 K/uL  Eosinophils Relative 1  0 - 5 %   Eosinophils Absolute 0.1  0.0 - 0.7 K/uL   Basophils Relative 0  0 - 1 %   Basophils Absolute 0.0  0.0 - 0.1 K/uL  COMPREHENSIVE METABOLIC PANEL      Result Value Range   Sodium 135  135 - 145 mEq/L   Potassium 3.9  3.5 - 5.1 mEq/L   Chloride 98  96 - 112 mEq/L   CO2 25  19 - 32 mEq/L   Glucose, Bld 261 (*) 70 - 99 mg/dL   BUN 13  6 - 23 mg/dL   Creatinine, Ser 1.61 (*) 0.50 - 1.10 mg/dL   Calcium 9.3  8.4 - 09.6 mg/dL   Total Protein 6.6  6.0 - 8.3 g/dL   Albumin 3.5  3.5 - 5.2 g/dL   AST 11  0 - 37 U/L   ALT 8  0 - 35 U/L   Alkaline Phosphatase 107  39 - 117 U/L   Total Bilirubin 0.2 (*) 0.3 - 1.2 mg/dL   GFR calc non Af Amer >90  >90 mL/min   GFR calc Af Amer >90  >90 mL/min  URINE MICROSCOPIC-ADD ON      Result Value Range   Squamous Epithelial / LPF MANY (*) RARE   WBC, UA TOO NUMEROUS TO COUNT  <3 WBC/hpf   Bacteria, UA MANY (*) RARE       MDM   1. UTI (lower urinary tract infection)   2. Hyperglycemia       Angus Seller, PA-C 06/10/13 [phone]

## 2013-06-09 MED ORDER — METFORMIN HCL ER 500 MG PO TB24: 500.0000 mg | ORAL_TABLET | Freq: Two times a day (BID) | ORAL | Status: DC

## 2013-06-09 MED ORDER — CEPHALEXIN 500 MG PO CAPS: 500.0000 mg | ORAL_CAPSULE | Freq: Four times a day (QID) | ORAL | Status: DC

## 2013-06-09 MED ORDER — ONDANSETRON 8 MG PO TBDP: ORAL_TABLET | ORAL | Status: DC

## 2013-06-09 MED ORDER — TRAMADOL HCL 50 MG PO TABS: 50.0000 mg | ORAL_TABLET | Freq: Four times a day (QID) | ORAL | Status: DC | PRN

## 2013-06-10 NOTE — ED Provider Notes (Signed)
Medical screening examination/treatment/procedure(s) were performed by non-physician practitioner and as supervising physician I was immediately available for consultation/collaboration.  EKG Interpretation   None         Aster Screws Y. Alyanna Stoermer, MD 06/10/13 0419 

## 2013-06-11 LAB — URINE CULTURE: Colony Count: >=100000

## 2013-06-12 ENCOUNTER — Encounter: Payer: Self-pay | Admitting: Internal Medicine

## 2013-06-12 ENCOUNTER — Other Ambulatory Visit: Payer: Self-pay | Admitting: Internal Medicine

## 2013-06-12 ENCOUNTER — Ambulatory Visit: Payer: Medicaid Other | Attending: Internal Medicine | Admitting: Internal Medicine

## 2013-06-12 ENCOUNTER — Telehealth (HOSPITAL_COMMUNITY): Payer: Self-pay | Admitting: Emergency Medicine

## 2013-06-12 VITALS — BP 160/70 | HR 94 | Temp 98.4°F | Resp 17 | Ht 62.0 in | Wt 140.4 lb

## 2013-06-12 DIAGNOSIS — Z23 Encounter for immunization: Secondary | ICD-10-CM

## 2013-06-12 DIAGNOSIS — E1151 Type 2 diabetes mellitus with diabetic peripheral angiopathy without gangrene: Secondary | ICD-10-CM | POA: Insufficient documentation

## 2013-06-12 DIAGNOSIS — Z139 Encounter for screening, unspecified: Secondary | ICD-10-CM

## 2013-06-12 DIAGNOSIS — I10 Essential (primary) hypertension: Secondary | ICD-10-CM | POA: Insufficient documentation

## 2013-06-12 DIAGNOSIS — E785 Hyperlipidemia, unspecified: Secondary | ICD-10-CM

## 2013-06-12 DIAGNOSIS — E119 Type 2 diabetes mellitus without complications: Secondary | ICD-10-CM

## 2013-06-12 DIAGNOSIS — R195 Other fecal abnormalities: Secondary | ICD-10-CM

## 2013-06-12 DIAGNOSIS — Z87440 Personal history of urinary (tract) infections: Secondary | ICD-10-CM

## 2013-06-12 DIAGNOSIS — IMO0001 Reserved for inherently not codable concepts without codable children: Secondary | ICD-10-CM | POA: Insufficient documentation

## 2013-06-12 DIAGNOSIS — R197 Diarrhea, unspecified: Secondary | ICD-10-CM

## 2013-06-12 DIAGNOSIS — F17200 Nicotine dependence, unspecified, uncomplicated: Secondary | ICD-10-CM

## 2013-06-12 MED ORDER — ATORVASTATIN CALCIUM 20 MG PO TABS: 20.0000 mg | ORAL_TABLET | Freq: Every day | ORAL | Status: DC

## 2013-06-12 MED ORDER — BENAZEPRIL HCL 10 MG PO TABS: 10.0000 mg | ORAL_TABLET | Freq: Every day | ORAL | Status: DC

## 2013-06-12 MED ORDER — INSULIN NPH ISOPHANE & REGULAR (70-30) 100 UNIT/ML ~~LOC~~ SUSP: 30.0000 [IU] | Freq: Two times a day (BID) | SUBCUTANEOUS | Status: DC

## 2013-06-12 MED ORDER — INSULIN GLARGINE 100 UNIT/ML ~~LOC~~ SOLN: 60.0000 [IU] | Freq: Every day | SUBCUTANEOUS | Status: DC

## 2013-06-12 MED ORDER — GLIPIZIDE 5 MG PO TABS: 5.0000 mg | ORAL_TABLET | Freq: Two times a day (BID) | ORAL | Status: DC

## 2013-06-12 MED ORDER — NICOTINE 21 MG/24HR TD PT24: 21.0000 mg | MEDICATED_PATCH | Freq: Every day | TRANSDERMAL | Status: DC

## 2013-06-12 NOTE — Progress Notes (Signed)
MRN: [id number] Name: Sarah Alvarez  Sex: female Age: 62 y.o. DOB: [birth year]  Allergies: Review of patient's allergies indicates no known allergies.  Chief Complaint  Patient presents with  . Diabetes  . new patient    HPI: Patient is 63 y.o. female who comes for the past establish medical care, she has long-standing history of diabetes hypertension hyperlipidemia ? MI in the past, she could not afford the medications and has not been taking her medications regularly, recently she went to the emergency room with symptoms of UTI, EMR reviewed UA consistent with urine infection, urine culture grew Escherichia coli, patient is on Keflex reports improvement in the symptoms, she always has problem with metformin she develops diarrhea last dose she took was last night, she is requesting to change the medication count denies any nausea vomiting fever or chills. Patient does smokes cigarettes everyday advised to quit smoking she is agreeable to try nicotine patch.  Past Medical History  Diagnosis Date  . Diabetes mellitus   . Staph infection     Past Surgical History  Procedure Laterality Date  . Cholecystectomy    . Abdominal hysterectomy        Medication List       This list is accurate as of: 06/12/13  9:51 AM.  Always use your most recent med list.               atorvastatin 20 MG tablet  Commonly known as:  LIPITOR  Take 1 tablet (20 mg total) by mouth daily.     benazepril 10 MG tablet  Commonly known as:  LOTENSIN  Take 1 tablet (10 mg total) by mouth daily.     cephALEXin 500 MG capsule  Commonly known as:  KEFLEX  Take 1 capsule (500 mg total) by mouth 4 (four) times daily.     glipiZIDE 5 MG tablet  Commonly known as:  GLUCOTROL  Take 1 tablet (5 mg total) by mouth 2 (two) times daily before a meal.     insulin aspart 100 UNIT/ML injection  Commonly known as:  novoLOG  Inject 5 Units into the skin 3 (three) times daily before meals.     insulin glargine 100  UNIT/ML injection  Commonly known as:  LANTUS  Inject 0.6 mLs (60 Units total) into the skin at bedtime.     nicotine 21 mg/24hr patch  Commonly known as:  NICODERM CQ  Place 1 patch (21 mg total) onto the skin daily.     ondansetron 8 MG disintegrating tablet  Commonly known as:  ZOFRAN ODT  8mg  ODT q4 hours prn nausea     QUEtiapine 50 MG tablet  Commonly known as:  SEROQUEL  Take 1 tablet (50 mg total) by mouth at bedtime. XR     traMADol 50 MG tablet  Commonly known as:  ULTRAM  Take 1 tablet (50 mg total) by mouth every 6 (six) hours as needed.        Meds ordered this encounter  Medications  . benazepril (LOTENSIN) 10 MG tablet    Sig: Take 1 tablet (10 mg total) by mouth daily.    Dispense:  30 tablet    Refill:  5  . atorvastatin (LIPITOR) 20 MG tablet    Sig: Take 1 tablet (20 mg total) by mouth daily.    Dispense:  30 tablet    Refill:  5  . insulin glargine (LANTUS) 100 UNIT/ML injection    Sig: Inject 0.6 mLs (60 Units  total) into the skin at bedtime.    Dispense:  10 mL    Refill:  5  . glipiZIDE (GLUCOTROL) 5 MG tablet    Sig: Take 1 tablet (5 mg total) by mouth 2 (two) times daily before a meal.    Dispense:  60 tablet    Refill:  3  . nicotine (NICODERM CQ) 21 mg/24hr patch    Sig: Place 1 patch (21 mg total) onto the skin daily.    Dispense:  28 patch    Refill:  0    Immunization History  Administered Date(s) Administered  . Influenza Split 06/05/2012  . Influenza,inj,Quad PF,36+ Mos 06/12/2013    History  Substance Use Topics  . Smoking status: Current Every Day Smoker -- 1.00 packs/day for 20 years  . Smokeless tobacco: Never Used  . Alcohol Use: No    Review of Systems  As noted in HPI  Filed Vitals:   06/12/13 0950  BP: 160/70  Pulse:   Temp:   Resp:     Physical Exam  Physical Exam  Constitutional: She is oriented to person, place, and time.  Eyes: EOM are normal. Pupils are equal, round, and reactive to light.   Cardiovascular: Normal rate and regular rhythm.   Pulmonary/Chest: Breath sounds normal. No respiratory distress. She has no wheezes. She has no rales.  Abdominal:  Suprapubic tenderness no rebound or guarding bowel sounds hyperactive  Musculoskeletal: She exhibits no edema.  Neurological: She is alert and oriented to person, place, and time.    CBC    Component Value Date/Time   WBC 12.3* 06/08/2013 2100   RBC 4.26 06/08/2013 2100   HGB 13.9 06/08/2013 2100   HCT 38.9 06/08/2013 2100   PLT 278 06/08/2013 2100   MCV 91.3 06/08/2013 2100   LYMPHSABS 3.9 06/08/2013 2100   MONOABS 0.7 06/08/2013 2100   EOSABS 0.1 06/08/2013 2100   BASOSABS 0.0 06/08/2013 2100    CMP     Component Value Date/Time   NA 135 06/08/2013 2100   K 3.9 06/08/2013 2100   CL 98 06/08/2013 2100   CO2 25 06/08/2013 2100   GLUCOSE 261* 06/08/2013 2100   BUN 13 06/08/2013 2100   CREATININE 0.41* 06/08/2013 2100   CALCIUM 9.3 06/08/2013 2100   PROT 6.6 06/08/2013 2100   ALBUMIN 3.5 06/08/2013 2100   AST 11 06/08/2013 2100   ALT 8 06/08/2013 2100   ALKPHOS 107 06/08/2013 2100   BILITOT 0.2* 06/08/2013 2100   GFRNONAA >90 06/08/2013 2100   GFRAA >90 06/08/2013 2100    Lab Results  Component Value Date/Time   CHOL 164 08/07/2009  9:33 PM    No components found with this basename: hga1c    Lab Results  Component Value Date/Time   AST 11 06/08/2013  9:00 PM    Assessment and Plan  DM (diabetes mellitus) - uncontrolled Plan: Glucose (CBG), HgB A1c 9.0%, resumed at insulin patient can not afford glargine (LANTUS) 100 UNIT/MLwill start her on NPH-Regular 70/30 bid , could not tolerate metformin discontinued switched to glipiZIDE (GLUCOTROL) 5 MG tablet twice a day.  Essential hypertension, benign - Plan: Resume back on benazepril (LOTENSIN) 10 MG tablet, low salt diet  Other and unspecified hyperlipidemia - Plan: atorvastatin (LIPITOR) 20 MG tablet,   History of UTI.... continue with  Keflex  Smoking - Plan: nicotine (NICODERM CQ) 21 mg/24hr patch  Loose bowel movement.... advised for BRAT diet   Screening - Plan: CBC with Differential, COMPLETE METABOLIC  PANEL WITH GFR, TSH, Lipid panel, Vit D  25 hydroxy (rtn osteoporosis monitoring)   Need for prophylactic vaccination and inoculation against influenza    Return in about 2 months (around 08/13/2013).  Doris Cheadle, MD

## 2013-06-12 NOTE — Progress Notes (Signed)
Patient here to establish care Was seen in the ED over the weekend for UTI History of DM\ Not taking metformin because she states it gives Her diarrhea

## 2013-06-12 NOTE — Patient Instructions (Signed)
Diet for Diarrhea, Adult  Frequent, runny stools (diarrhea) may be caused or worsened by food or drink. Diarrhea may be relieved by changing your diet. Since diarrhea can last up to 7 days, it is easy for you to lose too much fluid from the body and become dehydrated. Fluids that are lost need to be replaced. Along with a modified diet, make sure you drink enough fluids to keep your urine clear or pale yellow.  DIET INSTRUCTIONS  · Ensure adequate fluid intake (hydration): have 1 cup (8 oz) of fluid for each diarrhea episode. Avoid fluids that contain simple sugars or sports drinks, fruit juices, whole milk products, and sodas. Your urine should be clear or pale yellow if you are drinking enough fluids. Hydrate with an oral rehydration solution that you can purchase at pharmacies, retail stores, and online. You can prepare an oral rehydration solution at home by mixing the following ingredients together:  ·   tsp table salt.  · ¾ tsp baking soda.  ·  tsp salt substitute containing potassium chloride.  · 1  tablespoons sugar.  · 1 L (34 oz) of water.  · Certain foods and beverages may increase the speed at which food moves through the gastrointestinal (GI) tract. These foods and beverages should be avoided and include:  · Caffeinated and alcoholic beverages.  · High-fiber foods, such as raw fruits and vegetables, nuts, seeds, and whole grain breads and cereals.  · Foods and beverages sweetened with sugar alcohols, such as xylitol, sorbitol, and mannitol.  · Some foods may be well tolerated and may help thicken stool including:  · Starchy foods, such as rice, toast, pasta, low-sugar cereal, oatmeal, grits, baked potatoes, crackers, and bagels.    · Bananas.    · Applesauce.  · Add probiotic-rich foods to help increase healthy bacteria in the GI tract, such as yogurt and fermented milk products.  RECOMMENDED FOODS AND BEVERAGES  Starches  Choose foods with less than 2 g of fiber per serving.  · Recommended:  White,  French, and pita breads, plain rolls, buns, bagels. Plain muffins, matzo. Soda, saltine, or graham crackers. Pretzels, melba toast, zwieback. Cooked cereals made with water: cornmeal, farina, cream cereals. Dry cereals: refined corn, wheat, rice. Potatoes prepared any way without skins, refined macaroni, spaghetti, noodles, refined rice.  · Avoid:  Bread, rolls, or crackers made with whole wheat, multi-grains, rye, bran seeds, nuts, or coconut. Corn tortillas or taco shells. Cereals containing whole grains, multi-grains, bran, coconut, nuts, raisins. Cooked or dry oatmeal. Coarse wheat cereals, granola. Cereals advertised as "high-fiber." Potato skins. Whole grain pasta, wild or brown rice. Popcorn. Sweet potatoes, yams. Sweet rolls, doughnuts, waffles, pancakes, sweet breads.  Vegetables  · Recommended: Strained tomato and vegetable juices. Most well-cooked and canned vegetables without seeds. Fresh: Tender lettuce, cucumber without the skin, cabbage, spinach, bean sprouts.  · Avoid: Fresh, cooked, or canned: Artichokes, baked beans, beet greens, broccoli, Brussels sprouts, corn, kale, legumes, peas, sweet potatoes. Cooked: Green or red cabbage, spinach. Avoid large servings of any vegetables because vegetables shrink when cooked, and they contain more fiber per serving than fresh vegetables.  Fruit  · Recommended: Cooked or canned: Apricots, applesauce, cantaloupe, cherries, fruit cocktail, grapefruit, grapes, kiwi, mandarin oranges, peaches, pears, plums, watermelon. Fresh: Apples without skin, ripe banana, grapes, cantaloupe, cherries, grapefruit, peaches, oranges, plums. Keep servings limited to ½ cup or 1 piece.  · Avoid: Fresh: Apples with skin, apricots, mangoes, pears, raspberries, strawberries. Prune juice, stewed or dried prunes. Dried   fruits, raisins, dates. Large servings of all fresh fruits.  Protein  · Recommended: Ground or well-cooked tender beef, ham, veal, lamb, pork, or poultry. Eggs. Fish,  oysters, shrimp, lobster, other seafoods. Liver, organ meats.  · Avoid: Tough, fibrous meats with gristle. Peanut butter, smooth or chunky. Cheese, nuts, seeds, legumes, dried peas, beans, lentils.  Dairy  · Recommended: Yogurt, lactose-free milk, kefir, drinkable yogurt, buttermilk, soy milk, or plain hard cheese.  · Avoid: Milk, chocolate milk, beverages made with milk, such as milkshakes.  Soups  · Recommended: Bouillon, broth, or soups made from allowed foods. Any strained soup.  · Avoid: Soups made from vegetables that are not allowed, cream or milk-based soups.  Desserts and Sweets  · Recommended: Sugar-free gelatin, sugar-free frozen ice pops made without sugar alcohol.  · Avoid: Plain cakes and cookies, pie made with fruit, pudding, custard, cream pie. Gelatin, fruit, ice, sherbet, frozen ice pops. Ice cream, ice milk without nuts. Plain hard candy, honey, jelly, molasses, syrup, sugar, chocolate syrup, gumdrops, marshmallows.  Fats and Oils  · Recommended: Limit fats to less than 8 tsp per day.  · Avoid: Seeds, nuts, olives, avocados. Margarine, butter, cream, mayonnaise, salad oils, plain salad dressings. Plain gravy, crisp bacon without rind.  Beverages  · Recommended: Water, decaffeinated teas, oral rehydration solutions, sugar-free beverages not sweetened with sugar alcohols.  · Avoid: Fruit juices, caffeinated beverages (coffee, tea, soda), alcohol, sports drinks, or lemon-lime soda.  Condiments  · Recommended: Ketchup, mustard, horseradish, vinegar, cocoa powder. Spices in moderation: allspice, basil, bay leaves, celery powder or leaves, cinnamon, cumin powder, curry powder, ginger, mace, marjoram, onion or garlic powder, oregano, paprika, parsley flakes, ground pepper, rosemary, sage, savory, tarragon, thyme, turmeric.  · Avoid: Coconut, honey.  Document Released: 09/18/2003 Document Revised: 03/22/2012 Document Reviewed: 11/12/2011  [employer]® Patient Information ©2014 [employer].

## 2013-06-12 NOTE — ED Notes (Signed)
Post ED Visit - Positive Culture Follow-up  Culture report reviewed by antimicrobial stewardship pharmacist: []  Wes Dulaney, Pharm.D., BCPS [x]  Celedonio Miyamoto, [address].D., BCPS []  Georgina Pillion, Pharm.D., BCPS []  [place], [address].D., BCPS, AAHIVP []  Estella Husk, Pharm.D., BCPS, AAHIVP  Positive urine culture Treated with Keflex, organism sensitive to the same and no further patient follow-up is required at this time.  Kylie A Holland 06/12/2013, 1:43 PM

## 2013-06-14 ENCOUNTER — Emergency Department (HOSPITAL_COMMUNITY)
Admission: EM | Admit: 2013-06-14 | Discharge: 2013-06-14 | Disposition: A | Discharge status: Home / Self Care | Payer: Medicaid Other | Attending: Emergency Medicine | Admitting: Emergency Medicine

## 2013-06-14 ENCOUNTER — Encounter (HOSPITAL_COMMUNITY): Payer: Self-pay | Admitting: Emergency Medicine

## 2013-06-14 ENCOUNTER — Emergency Department (HOSPITAL_COMMUNITY): Payer: Medicaid Other

## 2013-06-14 DIAGNOSIS — Z792 Long term (current) use of antibiotics: Secondary | ICD-10-CM | POA: Insufficient documentation

## 2013-06-14 DIAGNOSIS — E119 Type 2 diabetes mellitus without complications: Secondary | ICD-10-CM | POA: Insufficient documentation

## 2013-06-14 DIAGNOSIS — Z79899 Other long term (current) drug therapy: Secondary | ICD-10-CM | POA: Insufficient documentation

## 2013-06-14 DIAGNOSIS — F17200 Nicotine dependence, unspecified, uncomplicated: Secondary | ICD-10-CM | POA: Insufficient documentation

## 2013-06-14 DIAGNOSIS — R109 Unspecified abdominal pain: Secondary | ICD-10-CM

## 2013-06-14 DIAGNOSIS — Z794 Long term (current) use of insulin: Secondary | ICD-10-CM | POA: Insufficient documentation

## 2013-06-14 DIAGNOSIS — Z9089 Acquired absence of other organs: Secondary | ICD-10-CM | POA: Insufficient documentation

## 2013-06-14 DIAGNOSIS — Z90710 Acquired absence of both cervix and uterus: Secondary | ICD-10-CM | POA: Insufficient documentation

## 2013-06-14 DIAGNOSIS — Z8619 Personal history of other infectious and parasitic diseases: Secondary | ICD-10-CM | POA: Insufficient documentation

## 2013-06-14 DIAGNOSIS — I10 Essential (primary) hypertension: Secondary | ICD-10-CM | POA: Insufficient documentation

## 2013-06-14 DIAGNOSIS — N390 Urinary tract infection, site not specified: Secondary | ICD-10-CM | POA: Insufficient documentation

## 2013-06-14 LAB — COMPREHENSIVE METABOLIC PANEL
ALT: 6 U/L (ref 0–35)
AST: 12 U/L (ref 0–37)
CO2: 24 mEq/L (ref 19–32)
Calcium: 9.6 mg/dL (ref 8.4–10.5)
Creatinine, Ser: 0.51 mg/dL (ref 0.50–1.10)
GFR calc Af Amer: >90 mL/min (ref >90)
GFR calc non Af Amer: >90 mL/min (ref >90)
Sodium: 140 mEq/L (ref 135–145)
Total Protein: 6.6 g/dL (ref 6.0–8.3)

## 2013-06-14 LAB — URINALYSIS, ROUTINE W REFLEX MICROSCOPIC
Glucose, UA: 250 mg/dL — AB
Protein, ur: 30 mg/dL — AB
Specific Gravity, Urine: 1.024 (ref 1.005–1.030)
Urobilinogen, UA: 1 mg/dL (ref 0.0–1.0)

## 2013-06-14 LAB — URINE MICROSCOPIC-ADD ON

## 2013-06-14 LAB — CBC WITH DIFFERENTIAL/PLATELET
Eosinophils Relative: 1 % (ref 0–5)
HCT: 39.5 % (ref 36.0–46.0)
Hemoglobin: 13.7 g/dL (ref 12.0–15.0)
Lymphocytes Relative: 19 % (ref 12–46)
Lymphs Abs: 2.8 10*3/uL (ref 0.7–4.0)
MCV: 92.7 fL (ref 78.0–100.0)
Monocytes Absolute: 0.9 10*3/uL (ref 0.1–1.0)
Monocytes Relative: 6 % (ref 3–12)
Neutro Abs: 11.2 10*3/uL — ABNORMAL HIGH (ref 1.7–7.7)
RBC: 4.26 MIL/uL (ref 3.87–5.11)
WBC: 15 10*3/uL — ABNORMAL HIGH (ref 4.0–10.5)

## 2013-06-14 LAB — LIPASE, BLOOD: Lipase: 29 U/L (ref 11–59)

## 2013-06-14 LAB — GLUCOSE, CAPILLARY: Glucose-Capillary: 71 mg/dL (ref 70–99)

## 2013-06-14 MED ORDER — HYDROMORPHONE HCL PF 1 MG/ML IJ SOLN
1.0000 mg | Freq: Once | INTRAMUSCULAR | Status: AC
  Administered 2013-06-14: 1 mg via INTRAVENOUS
  Filled 2013-06-14: qty 1

## 2013-06-14 MED ORDER — IOHEXOL 300 MG/ML  SOLN
100.0000 mL | Freq: Once | INTRAMUSCULAR | Status: AC | PRN
  Administered 2013-06-14: 100 mL via INTRAVENOUS

## 2013-06-14 MED ORDER — MORPHINE SULFATE 4 MG/ML IJ SOLN
4.0000 mg | Freq: Once | INTRAMUSCULAR | Status: AC
  Administered 2013-06-14: 4 mg via INTRAVENOUS
  Filled 2013-06-14: qty 1

## 2013-06-14 MED ORDER — ONDANSETRON HCL 4 MG/2ML IJ SOLN
4.0000 mg | Freq: Once | INTRAMUSCULAR | Status: AC
  Administered 2013-06-14: 4 mg via INTRAVENOUS
  Filled 2013-06-14: qty 2

## 2013-06-14 MED ORDER — PROMETHAZINE HCL 25 MG PO TABS: 25.0000 mg | ORAL_TABLET | Freq: Four times a day (QID) | ORAL | Status: DC | PRN

## 2013-06-14 MED ORDER — HYDROCODONE-ACETAMINOPHEN 5-325 MG PO TABS: 2.0000 | ORAL_TABLET | ORAL | Status: DC | PRN

## 2013-06-14 MED ORDER — SULFAMETHOXAZOLE-TRIMETHOPRIM 800-160 MG PO TABS: 1.0000 | ORAL_TABLET | Freq: Two times a day (BID) | ORAL | Status: DC

## 2013-06-14 MED ORDER — SODIUM CHLORIDE 0.9 % IV BOLUS (SEPSIS)
1000.0000 mL | Freq: Once | INTRAVENOUS | Status: AC
  Administered 2013-06-14: 1000 mL via INTRAVENOUS

## 2013-06-14 MED ORDER — IOHEXOL 300 MG/ML  SOLN
50.0000 mL | Freq: Once | INTRAMUSCULAR | Status: AC | PRN
  Administered 2013-06-14: 50 mL via ORAL

## 2013-06-14 NOTE — ED Provider Notes (Signed)
CSN: [id number]     Arrival date & time 06/14/13  1729 History   First MD Initiated Contact with Patient 06/14/13 1751     Chief Complaint  Patient presents with  . Abdominal Pain   (Consider location/radiation/quality/duration/timing/severity/associated sxs/prior Treatment) HPI Comments: Patient is a 62 year old female with a past medical history of diabetes and hypertension who presents with abdominal pain that started today. The pain is located in her generalized abdomen and does not radiate. The pain is described as aching and severe. The pain started gradually and progressively worsened since the onset. Patient reports she was diagnosed with a UTI 5 days ago at [facility]. She has been taking her antibiotics as directed. No alleviating/aggravating factors. The patient has tried her prescription pain medication for symptoms without relief. Associated symptoms include nausea. Patient denies fever, headache, vomiting, diarrhea, chest pain, SOB, dysuria, constipation.    Past Medical History  Diagnosis Date  . Diabetes mellitus   . Staph infection    Past Surgical History  Procedure Laterality Date  . Cholecystectomy    . Abdominal hysterectomy     Family History  Problem Relation Age of Onset  . Diabetes Mother   . Cancer Father   . Diabetes Sister   . Hypertension Sister   . Cancer Brother   . Cancer Maternal Aunt     breast cancer   . Diabetes Maternal Grandmother   . Diabetes Paternal Grandmother    History  Substance Use Topics  . Smoking status: Current Every Day Smoker -- 1.00 packs/day for 20 years  . Smokeless tobacco: Never Used  . Alcohol Use: No   OB History   Grav Para Term Preterm Abortions TAB SAB Ect Mult Living                 Review of Systems  Constitutional: Negative for fever, chills and fatigue.  HENT: Negative for trouble swallowing.   Eyes: Negative for visual disturbance.  Respiratory: Negative for shortness of breath.    Cardiovascular: Negative for chest pain and palpitations.  Gastrointestinal: Positive for nausea and abdominal pain. Negative for vomiting and diarrhea.  Genitourinary: Negative for dysuria and difficulty urinating.  Musculoskeletal: Negative for arthralgias and neck pain.  Skin: Negative for color change.  Neurological: Negative for dizziness and weakness.  Psychiatric/Behavioral: Negative for dysphoric mood.    Allergies  Review of patient's allergies indicates no known allergies.  Home Medications   Current Outpatient Rx  Name  Route  Sig  Dispense  Refill  . acetaminophen (TYLENOL) 650 MG CR tablet   Oral   Take 1,950 mg by mouth at bedtime as needed for pain.         Marland Kitchen atorvastatin (LIPITOR) 20 MG tablet   Oral   Take 1 tablet (20 mg total) by mouth daily.   30 tablet   5   . benazepril (LOTENSIN) 10 MG tablet   Oral   Take 1 tablet (10 mg total) by mouth daily.   30 tablet   5   . cephALEXin (KEFLEX) 500 MG capsule   Oral   Take 1 capsule (500 mg total) by mouth 4 (four) times daily.   28 capsule   0   . glipiZIDE (GLUCOTROL) 5 MG tablet   Oral   Take 1 tablet (5 mg total) by mouth 2 (two) times daily before a meal.   60 tablet   3   . insulin NPH-regular (NOVOLIN 70/30) (70-30) 100 UNIT/ML  injection   Subcutaneous   Inject 30 Units into the skin 2 (two) times daily with a meal.   10 mL   12   . Liniments (SALONPAS ARTHRITIS PAIN RELIEF) PADS   Apply externally   Apply 1 each topically at bedtime as needed (pain on hand).         . ondansetron (ZOFRAN ODT) 8 MG disintegrating tablet      8mg  ODT q4 hours prn nausea   20 tablet   0   . traMADol (ULTRAM) 50 MG tablet   Oral   Take 1 tablet (50 mg total) by mouth every 6 (six) hours as needed.   15 tablet   0   . insulin glargine (LANTUS) 100 UNIT/ML injection   Subcutaneous   Inject 0.6 mLs (60 Units total) into the skin at bedtime.   60 mL   1 year   . nicotine (NICODERM CQ) 21  mg/24hr patch   Transdermal   Place 1 patch (21 mg total) onto the skin daily.   28 patch   0    BP 106/57  Pulse 93  Temp(Src) 97.5 F (36.4 C) (Oral)  Resp 18  SpO2 97% Physical Exam  Nursing note and vitals reviewed. Constitutional: She is oriented to person, place, and time. She appears well-developed and well-nourished. No distress.  HENT:  Head: Normocephalic and atraumatic.  Eyes: Conjunctivae and EOM are normal. No scleral icterus.  Neck: Normal range of motion.  Cardiovascular: Normal rate and regular rhythm.  Exam reveals no gallop and no friction rub.   No murmur heard. Pulmonary/Chest: Effort normal and breath sounds normal. She has no wheezes. She has no rales. She exhibits no tenderness.  Abdominal: Soft. She exhibits no distension. There is tenderness. There is no rebound and no guarding.  Generalized tenderness to palpation. No focal tenderness or peritoneal signs.   Musculoskeletal: Normal range of motion.  Neurological: She is alert and oriented to person, place, and time. Coordination normal.  Speech is goal-oriented. Moves limbs without ataxia.   Skin: Skin is warm and dry.  Psychiatric: She has a normal mood and affect. Her behavior is normal.    ED Course  Procedures (including critical care time) Labs Review Labs Reviewed  CBC WITH DIFFERENTIAL - Abnormal; Notable for the following:    WBC 15.0 (*)    Neutro Abs 11.2 (*)    All other components within normal limits  COMPREHENSIVE METABOLIC PANEL - Abnormal; Notable for the following:    Potassium 3.2 (*)    Glucose, Bld 56 (*)    Total Bilirubin 0.2 (*)    All other components within normal limits  URINALYSIS, ROUTINE W REFLEX MICROSCOPIC - Abnormal; Notable for the following:    Color, Urine ORANGE (*)    APPearance CLOUDY (*)    Glucose, UA 250 (*)    Protein, ur 30 (*)    Nitrite POSITIVE (*)    Leukocytes, UA SMALL (*)    All other components within normal limits  URINE MICROSCOPIC-ADD  ON - Abnormal; Notable for the following:    Squamous Epithelial / LPF MANY (*)    Bacteria, UA MANY (*)    Crystals CA OXALATE CRYSTALS (*)    All other components within normal limits  URINE CULTURE  LIPASE, BLOOD  GLUCOSE, CAPILLARY   Imaging Review Ct Abdomen Pelvis W Contrast  06/14/2013   CLINICAL DATA:  Abdominal pain and diarrhea, history diabetes  EXAM: CT ABDOMEN AND PELVIS WITH  CONTRAST  TECHNIQUE: Multidetector CT imaging of the abdomen and pelvis was performed using the standard protocol following bolus administration of intravenous contrast. Sagittal and coronal MPR images reconstructed from axial data set.  CONTRAST:  50mL OMNIPAQUE IOHEXOL 300 MG/ML SOLN orally, OMNIPAQUE IOHEXOL 300 MG/ML SOLN IV  COMPARISON:  11/20/2009  FINDINGS: Lung bases clear.  Extensive atherosclerotic calcifications including coronary arteries, proximal celiac artery, proximal SMA, and renal arteries.  Gallbladder in uterus surgically absent.  Liver, spleen, pancreas, kidneys, and adrenal glands normal appearance.  Unremarkable left ovary with nonvisualization of right ovary.  Normal appendix.  Suboptimal GI contrast with large and small bowel loops unopacified and under distended.  No definite focal gastric or bowel abnormality identified.  No mass, adenopathy, free fluid, free air, hernia, or inflammatory process.  Bones appear demineralized with note of a diffusely bulging disc at L5-S1.  IMPRESSION: No acute intra-abdominal or intrapelvic abnormalities.  Extensive atherosclerotic calcifications including coronary arteries, proximal celiac artery, proximal SMA and renal arteries.   Electronically Signed   By: Ulyses Southward M.D.   On: 06/14/2013 20:33    EKG Interpretation   None       MDM   1. Abdominal pain   2. UTI (urinary tract infection)     7:20 PM Labs show elevated WBC. Urinalysis shows UTI and calcium oxalate crystals. Remaining labs unremarkable. Patient will have CT abdomen pelvis  with contrast. Patient will have fluids, morphine and zofran for symptoms.   10:02 PM CT shows no acute changes. I will prescribe bactrim for UTI. Patient will have Vicodin for pain and phenergan for nausea. Patient instructed to follow up with PCP. Vitals stable and patient afebrile. Patient instructed to return to the ED with worsening or concerning symptoms.   Emilia Beck, PA-C 06/14/13 2206

## 2013-06-14 NOTE — ED Notes (Signed)
Bed: WA02 Expected date:  Expected time:  Means of arrival:  Comments: EMS-abdominal pain-hypertensive/tachy

## 2013-06-14 NOTE — ED Notes (Signed)
Per EMS-was diagnosed with UTI last Friday-states she is out of pain meds-has been taking antibiotics and zofan

## 2013-06-15 LAB — URINE CULTURE: Colony Count: NO GROWTH

## 2013-06-15 NOTE — ED Provider Notes (Signed)
Medical screening examination/treatment/procedure(s) were performed by non-physician practitioner and as supervising physician I was immediately available for consultation/collaboration.  EKG Interpretation   None         Junius Argyle, MD 06/15/13 0004

## 2013-06-19 ENCOUNTER — Ambulatory Visit: Payer: Medicaid Other | Attending: Internal Medicine

## 2013-06-19 ENCOUNTER — Telehealth: Payer: Self-pay | Admitting: Internal Medicine

## 2013-06-19 ENCOUNTER — Encounter (HOSPITAL_COMMUNITY): Payer: Self-pay | Admitting: Emergency Medicine

## 2013-06-19 ENCOUNTER — Inpatient Hospital Stay (HOSPITAL_COMMUNITY)
Admission: EM | Admit: 2013-06-19 | Discharge: 2013-06-25 | DRG: 357 | Disposition: A | Discharge status: Home / Self Care | Payer: Medicaid Other | Attending: Internal Medicine | Admitting: Internal Medicine

## 2013-06-19 VITALS — BP 73/52 | HR 110 | Temp 98.4°F | Resp 22

## 2013-06-19 DIAGNOSIS — R112 Nausea with vomiting, unspecified: Secondary | ICD-10-CM

## 2013-06-19 DIAGNOSIS — Z9861 Coronary angioplasty status: Secondary | ICD-10-CM

## 2013-06-19 DIAGNOSIS — Z794 Long term (current) use of insulin: Secondary | ICD-10-CM

## 2013-06-19 DIAGNOSIS — I959 Hypotension, unspecified: Secondary | ICD-10-CM | POA: Diagnosis present

## 2013-06-19 DIAGNOSIS — I10 Essential (primary) hypertension: Secondary | ICD-10-CM

## 2013-06-19 DIAGNOSIS — E785 Hyperlipidemia, unspecified: Secondary | ICD-10-CM | POA: Diagnosis present

## 2013-06-19 DIAGNOSIS — B3781 Candidal esophagitis: Secondary | ICD-10-CM | POA: Diagnosis present

## 2013-06-19 DIAGNOSIS — Z79899 Other long term (current) drug therapy: Secondary | ICD-10-CM

## 2013-06-19 DIAGNOSIS — Z7982 Long term (current) use of aspirin: Secondary | ICD-10-CM

## 2013-06-19 DIAGNOSIS — R0989 Other specified symptoms and signs involving the circulatory and respiratory systems: Secondary | ICD-10-CM

## 2013-06-19 DIAGNOSIS — R109 Unspecified abdominal pain: Secondary | ICD-10-CM

## 2013-06-19 DIAGNOSIS — I951 Orthostatic hypotension: Secondary | ICD-10-CM

## 2013-06-19 DIAGNOSIS — E119 Type 2 diabetes mellitus without complications: Secondary | ICD-10-CM

## 2013-06-19 DIAGNOSIS — Z87440 Personal history of urinary (tract) infections: Secondary | ICD-10-CM

## 2013-06-19 DIAGNOSIS — F17200 Nicotine dependence, unspecified, uncomplicated: Secondary | ICD-10-CM | POA: Diagnosis present

## 2013-06-19 DIAGNOSIS — Z803 Family history of malignant neoplasm of breast: Secondary | ICD-10-CM

## 2013-06-19 DIAGNOSIS — N390 Urinary tract infection, site not specified: Secondary | ICD-10-CM | POA: Diagnosis present

## 2013-06-19 DIAGNOSIS — I6529 Occlusion and stenosis of unspecified carotid artery: Secondary | ICD-10-CM | POA: Diagnosis present

## 2013-06-19 DIAGNOSIS — I2510 Atherosclerotic heart disease of native coronary artery without angina pectoris: Secondary | ICD-10-CM | POA: Diagnosis present

## 2013-06-19 DIAGNOSIS — E860 Dehydration: Secondary | ICD-10-CM

## 2013-06-19 DIAGNOSIS — R634 Abnormal weight loss: Secondary | ICD-10-CM | POA: Diagnosis present

## 2013-06-19 DIAGNOSIS — Z809 Family history of malignant neoplasm, unspecified: Secondary | ICD-10-CM

## 2013-06-19 DIAGNOSIS — K3184 Gastroparesis: Secondary | ICD-10-CM

## 2013-06-19 DIAGNOSIS — E871 Hypo-osmolality and hyponatremia: Secondary | ICD-10-CM | POA: Diagnosis present

## 2013-06-19 DIAGNOSIS — K551 Chronic vascular disorders of intestine: Secondary | ICD-10-CM

## 2013-06-19 DIAGNOSIS — I6509 Occlusion and stenosis of unspecified vertebral artery: Secondary | ICD-10-CM | POA: Diagnosis present

## 2013-06-19 DIAGNOSIS — E1151 Type 2 diabetes mellitus with diabetic peripheral angiopathy without gangrene: Secondary | ICD-10-CM | POA: Diagnosis present

## 2013-06-19 DIAGNOSIS — Z833 Family history of diabetes mellitus: Secondary | ICD-10-CM

## 2013-06-19 DIAGNOSIS — Z8249 Family history of ischemic heart disease and other diseases of the circulatory system: Secondary | ICD-10-CM

## 2013-06-19 DIAGNOSIS — Z23 Encounter for immunization: Secondary | ICD-10-CM

## 2013-06-19 DIAGNOSIS — Z7902 Long term (current) use of antithrombotics/antiplatelets: Secondary | ICD-10-CM

## 2013-06-19 LAB — URINALYSIS, ROUTINE W REFLEX MICROSCOPIC
Hgb urine dipstick: NEGATIVE
Protein, ur: NEGATIVE mg/dL
Specific Gravity, Urine: 1.02 (ref 1.005–1.030)
Urobilinogen, UA: 0.2 mg/dL (ref 0.0–1.0)
pH: 6 (ref 5.0–8.0)

## 2013-06-19 LAB — CBC WITH DIFFERENTIAL/PLATELET
Basophils Absolute: 0 10*3/uL (ref 0.0–0.1)
Basophils Relative: 0 % (ref 0–1)
Eosinophils Absolute: 0.1 10*3/uL (ref 0.0–0.7)
Eosinophils Relative: 0 % (ref 0–5)
Hemoglobin: 14.5 g/dL (ref 12.0–15.0)
MCH: 33 pg (ref 26.0–34.0)
MCHC: 35.7 g/dL (ref 30.0–36.0)
MCV: 92.3 fL (ref 78.0–100.0)
Monocytes Relative: 7 % (ref 3–12)
Platelets: 293 10*3/uL (ref 150–400)
RDW: 13 % (ref 11.5–15.5)
WBC: 16.5 10*3/uL — ABNORMAL HIGH (ref 4.0–10.5)

## 2013-06-19 LAB — URINE MICROSCOPIC-ADD ON

## 2013-06-19 LAB — BASIC METABOLIC PANEL
Calcium: 9.3 mg/dL (ref 8.4–10.5)
Chloride: 97 mEq/L (ref 96–112)
GFR calc Af Amer: >90 mL/min (ref >90)
GFR calc non Af Amer: >90 mL/min (ref >90)
Glucose, Bld: 164 mg/dL — ABNORMAL HIGH (ref 70–99)
Sodium: 131 mEq/L — ABNORMAL LOW (ref 135–145)

## 2013-06-19 LAB — GLUCOSE, POCT (MANUAL RESULT ENTRY): POC Glucose: 193 mg/dl — AB (ref 70–99)

## 2013-06-19 LAB — MAGNESIUM: Magnesium: 1.7 mg/dL (ref 1.5–2.5)

## 2013-06-19 LAB — PHOSPHORUS: Phosphorus: 3.5 mg/dL (ref 2.3–4.6)

## 2013-06-19 MED ORDER — ENOXAPARIN SODIUM 40 MG/0.4ML ~~LOC~~ SOLN
40.0000 mg | SUBCUTANEOUS | Status: DC
  Administered 2013-06-19 – 2013-06-20 (×2): 40 mg via SUBCUTANEOUS
  Filled 2013-06-19 (×3): qty 0.4

## 2013-06-19 MED ORDER — PANTOPRAZOLE SODIUM 40 MG IV SOLR
40.0000 mg | Freq: Two times a day (BID) | INTRAVENOUS | Status: DC
  Administered 2013-06-19 – 2013-06-24 (×9): 40 mg via INTRAVENOUS
  Filled 2013-06-19 (×13): qty 40

## 2013-06-19 MED ORDER — SODIUM CHLORIDE 0.9 % IV SOLN
INTRAVENOUS | Status: DC
  Administered 2013-06-19 – 2013-06-21 (×2): via INTRAVENOUS

## 2013-06-19 MED ORDER — ONDANSETRON HCL 4 MG/2ML IJ SOLN: 4.0000 mg | Freq: Four times a day (QID) | INTRAMUSCULAR | Status: DC | PRN

## 2013-06-19 MED ORDER — ACETAMINOPHEN 325 MG PO TABS
650.0000 mg | ORAL_TABLET | Freq: Four times a day (QID) | ORAL | Status: DC | PRN
  Administered 2013-06-24: 650 mg via ORAL
  Filled 2013-06-19: qty 2

## 2013-06-19 MED ORDER — INSULIN ASPART 100 UNIT/ML ~~LOC~~ SOLN
0.0000 [IU] | SUBCUTANEOUS | Status: DC
  Administered 2013-06-20: 3 [IU] via SUBCUTANEOUS
  Administered 2013-06-21: 2 [IU] via SUBCUTANEOUS
  Administered 2013-06-21 (×2): 3 [IU] via SUBCUTANEOUS
  Administered 2013-06-21 – 2013-06-22 (×2): 2 [IU] via SUBCUTANEOUS
  Administered 2013-06-22: 8 [IU] via SUBCUTANEOUS
  Administered 2013-06-23: 2 [IU] via SUBCUTANEOUS
  Administered 2013-06-23: 5 [IU] via SUBCUTANEOUS

## 2013-06-19 MED ORDER — ONDANSETRON HCL 4 MG PO TABS: 4.0000 mg | ORAL_TABLET | Freq: Four times a day (QID) | ORAL | Status: DC | PRN

## 2013-06-19 MED ORDER — ACETAMINOPHEN 650 MG RE SUPP: 650.0000 mg | Freq: Four times a day (QID) | RECTAL | Status: DC | PRN

## 2013-06-19 MED ORDER — SODIUM CHLORIDE 0.9 % IV SOLN
Freq: Once | INTRAVENOUS | Status: AC
  Administered 2013-06-19: 15:00:00 via INTRAVENOUS

## 2013-06-19 MED ORDER — SODIUM CHLORIDE 0.9 % IV BOLUS (SEPSIS)
20.0000 mL | Freq: Once | INTRAVENOUS | Status: AC
  Administered 2013-06-19: 20 mL via INTRAVENOUS

## 2013-06-19 MED ORDER — NICOTINE 21 MG/24HR TD PT24
21.0000 mg | MEDICATED_PATCH | Freq: Every day | TRANSDERMAL | Status: DC
  Administered 2013-06-19 – 2013-06-25 (×7): 21 mg via TRANSDERMAL
  Filled 2013-06-19 (×7): qty 1

## 2013-06-19 MED ORDER — ONDANSETRON HCL 4 MG/2ML IJ SOLN
4.0000 mg | Freq: Once | INTRAMUSCULAR | Status: AC
  Administered 2013-06-19: 4 mg via INTRAVENOUS

## 2013-06-19 MED ORDER — HYDROCODONE-ACETAMINOPHEN 5-325 MG PO TABS
1.0000 | ORAL_TABLET | ORAL | Status: DC | PRN
  Administered 2013-06-22 – 2013-06-24 (×9): 1 via ORAL
  Filled 2013-06-19 (×9): qty 1

## 2013-06-19 MED ORDER — MORPHINE SULFATE 2 MG/ML IJ SOLN
2.0000 mg | INTRAMUSCULAR | Status: DC | PRN
  Administered 2013-06-19 – 2013-06-22 (×11): 2 mg via INTRAVENOUS
  Filled 2013-06-19 (×11): qty 1

## 2013-06-19 NOTE — Consult Note (Signed)
Reason for Consult: ABM pain, N/V, and diarrhea. Referring Physician: [facility].  Sarah Alvarez HPI: This is a 62 year old female with a PMH of poorly controlled DM, s/p cholecystectomy, and tobacco abuse admitted for persistent mid to lower abdominal pain.  The patient reports an acute onset of her pain this past Thanksgiving.  She denies any prior history of this type of pain.  She initially presented to [facility] ER on Thanksgiving and she was diagnosed with an UTI.  Antibiotics and pain medications were provided, but this did not improve her situation.  She represented to the ER and during this second evaluation she underwent a CT scan.  The scan was significant for severe atherosclerotic disease.  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  PO intake will worsen her symptoms of nausea, vomiting, and abdominal pain.  She denies any issues with hematochezia, melena, or constipation, but diarrhea has been an issue for her.  No complaints of any fever.  Her most recent A1C is at 9 and in 2011 it was at 13.  Past Medical History  Diagnosis Date  . Diabetes mellitus   . Staph infection     Past Surgical History  Procedure Laterality Date  . Cholecystectomy    . Abdominal hysterectomy      Family History  Problem Relation Age of Onset  . Diabetes Mother   . Cancer Father   . Diabetes Sister   . Hypertension Sister   . Cancer Brother   . Cancer Maternal Aunt     breast cancer   . Diabetes Maternal Grandmother   . Diabetes Paternal Grandmother     Social History:  reports that she has been smoking.  She has never used smokeless tobacco. She reports that she does not drink alcohol or use illicit drugs.  Allergies: No Known Allergies  Medications: Scheduled:  Continuous:   Results for orders placed during the hospital encounter of 06/19/13 (from the past 24 hour(s))  CBC WITH DIFFERENTIAL     Status: Abnormal   Collection Time    06/19/13  2:12  PM      Result Value Range   WBC 16.5 (*) 4.0 - 10.5 K/uL   RBC 4.40  3.87 - 5.11 MIL/uL   Hemoglobin 14.5  12.0 - 15.0 g/dL   HCT 52.8  41.3 - 24.4 %   MCV 92.3  78.0 - 100.0 fL   MCH 33.0  26.0 - 34.0 pg   MCHC 35.7  30.0 - 36.0 g/dL   RDW 01.0  27.2 - 53.6 %   Platelets 293  150 - 400 K/uL   Neutrophils Relative % 72  43 - 77 %   Neutro Abs 11.9 (*) 1.7 - 7.7 K/uL   Lymphocytes Relative 21  12 - 46 %   Lymphs Abs 3.4  0.7 - 4.0 K/uL   Monocytes Relative 7  3 - 12 %   Monocytes Absolute 1.1 (*) 0.1 - 1.0 K/uL   Eosinophils Relative 0  0 - 5 %   Eosinophils Absolute 0.1  0.0 - 0.7 K/uL   Basophils Relative 0  0 - 1 %   Basophils Absolute 0.0  0.0 - 0.1 K/uL  BASIC METABOLIC PANEL     Status: Abnormal   Collection Time    06/19/13  2:12 PM      Result Value Range   Sodium 131 (*) 135 - 145 mEq/L   Potassium 4.6  3.5 - 5.1 mEq/L   Chloride 97  96 - 112 mEq/L   CO2 22  19 - 32 mEq/L   Glucose, Bld 164 (*) 70 - 99 mg/dL   BUN 18  6 - 23 mg/dL   Creatinine, Ser 1.61  0.50 - 1.10 mg/dL   Calcium 9.3  8.4 - 09.6 mg/dL   GFR calc non Af Amer >90  >90 mL/min   GFR calc Af Amer >90  >90 mL/min  MAGNESIUM     Status: None   Collection Time    06/19/13  2:12 PM      Result Value Range   Magnesium 1.7  1.5 - 2.5 mg/dL  PHOSPHORUS     Status: None   Collection Time    06/19/13  2:12 PM      Result Value Range   Phosphorus 3.5  2.3 - 4.6 mg/dL     No results found.  ROS:  As stated above in the HPI otherwise negative.  Blood pressure 132/84, pulse 90, temperature 98.7 F (37.1 C), temperature source Oral, resp. rate 18, SpO2 98.00%.    PE: Gen: NAD, Alert and Oriented HEENT:  [place]/AT, EOMI Neck: Supple, no LAD Lungs: CTA Bilaterally CV: RRR without M/G/R ABM: Soft, tender in the mid and lower abdomen to mild palpation, +BS Ext: No C/C/E  Assessment/Plan: 1) ABM pain. 2) Poorly controlled DM. 3) Severe atherosclerotic disease.   The etiology of the patient's  symptoms is not clear as there are may possibilities in light of the objective findings.  I am concerned about intestinal angina, however, the presentation is atypical as she has persistent abdominal pain.  On the other hand, her pain does worsen with PO intake.  I am unable to discern if there is any significant weight loss, but her PO intake is certainly down.  In the past I have noted severe abdominal pain in the setting of an UTI.  During her initial ER presentation she had back pain along with her abdominal pain.  No issues with odynophagia or dysphagia.  Gastroparesis can be a consideration, but the pain is far lower down than I would anticipate.  Also, I have noted unusual presentations with a Candidal esophagitis.  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 2) Tentative EGD/Colonoscopy Thursday. 3) Symptom control with pain medications and anti-emetics.  Dyshawn Cangelosi D 06/19/2013, 6:03 PM

## 2013-06-19 NOTE — H&P (Signed)
[facility] History and Physical  Sarah Alvarez ZOX:[id number] DOB: 15-Jan-1951 DOA: 06/19/2013  Referring physician:  PCP: Sarah Cheadle, MD  Specialists:   Chief Complaint: Nausea/Vomiting  HPI: Sarah Alvarez is a 62 y.o. female with a past medical history of tobacco abuse, type 2 diabetes mellitus, hypertension and dyslipidemia who presents to the emergency department this evening with complaints of recurrent episodes of nausea/vomiting as well as epigastric pain. She was recently seen in the emergency Department at [facility] at which time she presented with complaints of abdominal pain, undergoing a CT scan of abdomen and pelvis which showed no acute intra-abdominal or intrapelvic abnormality. She was also recently diagnosed with a urinary tract infection and discharged on Keflex. She reports having abdominal pain located in the epigastric and lower abdominal region since Thanksgiving characterized as sharp, stabbing, worsened by by mouth intake particularly carbonated beverages. Over the last several days she has had multiple episodes of nonbloody emesis, unable to keep by mouth down including her medications. She reports accompanying symptoms of dizziness and lightheadedness particularly going from sitting to standing. She denies black tarry stools, bright red blood per rectum or hematemesis. She denies alcohol use. Initial lab work in the emergency room showed a sodium level of 131, normal kidney function. Her white count was elevated at 16,500. She denies recent travel or sick contacts.                                                                                     Review of Systems: The patient denies anorexia, fever, weight loss,, vision loss, decreased hearing, hoarseness, chest pain, syncope, dyspnea on exertion, peripheral edema, balance deficits, hemoptysis, abdominal pain, melena, hematochezia, severe indigestion/heartburn, hematuria, incontinence, genital sores, muscle  weakness, suspicious skin lesions, transient blindness, difficulty walking, depression, unusual weight change, abnormal bleeding, enlarged lymph nodes, angioedema, and breast masses.    Past Medical History  Diagnosis Date  . Diabetes mellitus   . Staph infection    Past Surgical History  Procedure Laterality Date  . Cholecystectomy    . Abdominal hysterectomy     Social History:  reports that she has been smoking.  She has never used smokeless tobacco. She reports that she does not drink alcohol or use illicit drugs.  No Known Allergies  Family History  Problem Relation Age of Onset  . Diabetes Mother   . Cancer Father   . Diabetes Sister   . Hypertension Sister   . Cancer Brother   . Cancer Maternal Aunt     breast cancer   . Diabetes Maternal Grandmother   . Diabetes Paternal Grandmother     Prior to Admission medications   Medication Sig Start Date End Date Taking? Authorizing Provider  atorvastatin (LIPITOR) 20 MG tablet Take 1 tablet (20 mg total) by mouth daily. 06/12/13  Yes Deepak Advani, MD  benazepril (LOTENSIN) 10 MG tablet Take 1 tablet (10 mg total) by mouth daily. 06/12/13  Yes Sarah Cheadle, MD  glipiZIDE (GLUCOTROL) 5 MG tablet Take 1 tablet (5 mg total) by mouth 2 (two) times daily before a meal. 06/12/13  Yes Sarah Cheadle, MD  HYDROcodone-acetaminophen (NORCO/VICODIN) 5-325 MG per  tablet Take 2 tablets by mouth every 4 (four) hours as needed. 06/14/13  Yes Kaitlyn Szekalski, PA-C  insulin NPH-regular (NOVOLIN 70/30) (70-30) 100 UNIT/ML injection Inject 30 Units into the skin 2 (two) times daily with a meal. 06/12/13  Yes Deepak Advani, MD  Liniments (SALONPAS ARTHRITIS PAIN RELIEF) PADS Apply 1 each topically at bedtime as needed (pain on hand).   Yes Historical Provider, MD  metFORMIN (GLUMETZA) 500 MG (MOD) 24 hr tablet Take 500 mg by mouth 2 (two) times daily with a meal.   Yes Historical Provider, MD  promethazine (PHENERGAN) 25 MG tablet Take 1 tablet (25 mg  total) by mouth every 6 (six) hours as needed for nausea or vomiting. 06/14/13  Yes Kaitlyn Szekalski, PA-C  sulfamethoxazole-trimethoprim (SEPTRA DS) 800-160 MG per tablet Take 1 tablet by mouth every 12 (twelve) hours. 06/14/13  Yes Kaitlyn Szekalski, PA-C  insulin glargine (LANTUS) 100 UNIT/ML injection Inject 0.6 mLs (60 Units total) into the skin at bedtime. 06/12/13   Sarah Lewandowsky, MD  nicotine (NICODERM CQ) 21 mg/24hr patch Place 1 patch (21 mg total) onto the skin daily. 06/12/13   Sarah Cheadle, MD   Physical Exam: Filed Vitals:   06/19/13 1458  BP: 81/46  Pulse: 98  Temp:   Resp:      General:  Ill-appearing, in mild to moderate distress, reports abdominal pain.  Eyes: Pupils are equal round and reactive to light extraocular movement   Neck: Supple symmetrical no jugular venous distention or carotid bruits  Cardiovascular: Regular rate and rhythm normal S1-S2 she has a 2/6 systolic ejection murmur  Respiratory: Lungs are clear to auscultation bilaterally no wheezing rhonchi or rales  Abdomen: Patient having pain to palpation over the epigastric and periumbilical region, no rebound tenderness, guarding or peritoneal signs.  Skin: No rashes or lesions  Musculoskeletal: Present range of motion to all extremities, no edema  Psychiatric: Patient is awake alert and oriented x3  Neurologic: Cranial nerves II through XII grossly intact no alteration to sensation global 5 of 5 muscle strength  Labs on Admission:  Basic Metabolic Panel:  Recent Labs Lab 06/14/13 1827 06/19/13 1412  NA 140 131*  K 3.2* 4.6  CL 103 97  CO2 24 22  GLUCOSE 56* 164*  BUN 15 18  CREATININE 0.51 0.50  CALCIUM 9.6 9.3  MG  --  1.7  PHOS  --  3.5   Liver Function Tests:  Recent Labs Lab 06/14/13 1827  AST 12  ALT 6  ALKPHOS 97  BILITOT 0.2*  PROT 6.6  ALBUMIN 3.6    Recent Labs Lab 06/14/13 1827  LIPASE 29   No results found for this basename: AMMONIA,  in the last 168  hours CBC:  Recent Labs Lab 06/14/13 1827 06/19/13 1412  WBC 15.0* 16.5*  NEUTROABS 11.2* 11.9*  HGB 13.7 14.5  HCT 39.5 40.6  MCV 92.7 92.3  PLT 399 293   Cardiac Enzymes: No results found for this basename: CKTOTAL, CKMB, CKMBINDEX, TROPONINI,  in the last 168 hours  BNP (last 3 results) No results found for this basename: PROBNP,  in the last 8760 hours CBG:  Recent Labs Lab 06/14/13 2158  GLUCAP 71    Radiological Exams on Admission: No results found.    Assessment/Plan Active Problems:   Nausea & vomiting   Gastroparesis   DM (diabetes mellitus)   Essential hypertension, benign   Other and unspecified hyperlipidemia   History of UTI   Dehydration   1.  Intractable nausea/vomiting. Patient presenting with multiple episodes of nausea and vomiting, unable to keep by mouth down, becoming dehydrated. With her history of diabetes mellitus, gastroparesis as a possibility. Other possibilities include peptic ulcer disease/gastritis given patient reports of symptoms worsening with by mouth intake as well as smoking history. Infectious gastroenteritis is also possible as she presents with an elevated white count of 16,500. I discussed case with Dr.Hung of gastroenterology for consideration of upper endoscopy. Meanwhile will provide supportive care, IV Protonix 40 mg twice a day, IV fluid resuscitation, IV anti-emetic therapy as needed, await further recommendations from gastroenterology. Will order a gastric emptying study.  2. Dehydration. Patient clinically dehydrated, presenting with hypotension that responded to IV fluid resuscitation. Will continue IV fluid resuscitation with normal saline at 150 mL per hour. 3. Hyponatremia. Patient's lab work showing a sodium of 131 which could be secondary to hypotonic hypovolemic hyponatremia. Providing IV fluid resuscitation with normal saline. 4. History of hypertension. Given profound dehydration, hypotension, will discontinue  antihypertensive agents for now 5. Type 2 diabetes mellitus. Will discontinue hypoglycemics as patient has not taken in by mouth, place her on Accu-Cheks every 4 hours with sliding scale coverage. 6. Nutrition. N.p.o. 7. DVT prophylaxis. Lovenox    Code Status: Full Code Disposition Plan: Will place patient in observation, provide IV fluids, do not anticipate her requiring greater than 2 nights hospitalization.   Time spent: 60  Jeralyn Bennett [facility] Pager [phone]  If 7PM-7AM, please contact night-coverage [email or web address] Password [email or web address] 06/19/2013, 4:45 PM

## 2013-06-19 NOTE — Progress Notes (Signed)
Pt comes in as walk in with c/o nausea/vomiting x 4 and diarrhea s/p ER [facility] d/c 06/14/13.  CT scan negative. Pt given ATB for UTI and pain medication States unable to keep food/fluid down Diabetic type 1- cbg 193 Hypotensive lying 92/63/ 109 Standing 73/52 110 Dehydrated,dry mouth Started nss 500 cc bolus via 18 g left hand Intact,fluid infusing EMS called

## 2013-06-19 NOTE — ED Provider Notes (Signed)
CSN: [id number]     Arrival date & time 06/19/13  1321 History   First MD Initiated Contact with Patient 06/19/13 1352     Chief Complaint  Patient presents with  . Emesis   (Consider location/radiation/quality/duration/timing/severity/associated sxs/prior Treatment) HPI Comments: 62 y.o. female sent to the ER from [facility] for dehydration. Pt has hx of DM, HTN and has been having abd pain x 10 days. The pain is lower quadrants bilaterally, and is constant, sharp pain, with associated nausea and emesis and diarrhea. Pt has had 5+ emesis in the last 12 hours with no blood. Pt is dizzy. She is on keflex and bactrim for uti, and taking phenergan, with no relief and unable to keep her meds down.  The history is provided by the patient and a relative.    Past Medical History  Diagnosis Date  . Diabetes mellitus   . Staph infection    Past Surgical History  Procedure Laterality Date  . Cholecystectomy    . Abdominal hysterectomy     Family History  Problem Relation Age of Onset  . Diabetes Mother   . Cancer Father   . Diabetes Sister   . Hypertension Sister   . Cancer Brother   . Cancer Maternal Aunt     breast cancer   . Diabetes Maternal Grandmother   . Diabetes Paternal Grandmother    History  Substance Use Topics  . Smoking status: Current Every Day Smoker -- 1.00 packs/day for 20 years  . Smokeless tobacco: Never Used  . Alcohol Use: No   OB History   Grav Para Term Preterm Abortions TAB SAB Ect Mult Living                 Review of Systems  Constitutional: Positive for activity change. Negative for fatigue.  Respiratory: Negative for shortness of breath.   Cardiovascular: Negative for chest pain.  Gastrointestinal: Positive for nausea, vomiting and diarrhea. Negative for abdominal pain.  Genitourinary: Negative for dysuria.  Musculoskeletal: Negative for neck pain.  Neurological: Positive for dizziness and weakness. Negative for headaches.    Allergies   Review of patient's allergies indicates no known allergies.  Home Medications   Current Outpatient Rx  Name  Route  Sig  Dispense  Refill  . atorvastatin (LIPITOR) 20 MG tablet   Oral   Take 1 tablet (20 mg total) by mouth daily.   30 tablet   5   . benazepril (LOTENSIN) 10 MG tablet   Oral   Take 1 tablet (10 mg total) by mouth daily.   30 tablet   5   . glipiZIDE (GLUCOTROL) 5 MG tablet   Oral   Take 1 tablet (5 mg total) by mouth 2 (two) times daily before a meal.   60 tablet   3   . HYDROcodone-acetaminophen (NORCO/VICODIN) 5-325 MG per tablet   Oral   Take 2 tablets by mouth every 4 (four) hours as needed.   20 tablet   0   . insulin NPH-regular (NOVOLIN 70/30) (70-30) 100 UNIT/ML injection   Subcutaneous   Inject 30 Units into the skin 2 (two) times daily with a meal.   10 mL   12   . Liniments (SALONPAS ARTHRITIS PAIN RELIEF) PADS   Apply externally   Apply 1 each topically at bedtime as needed (pain on hand).         . metFORMIN (GLUMETZA) 500 MG (MOD) 24 hr tablet   Oral   Take  500 mg by mouth 2 (two) times daily with a meal.         . promethazine (PHENERGAN) 25 MG tablet   Oral   Take 1 tablet (25 mg total) by mouth every 6 (six) hours as needed for nausea or vomiting.   12 tablet   0   . sulfamethoxazole-trimethoprim (SEPTRA DS) 800-160 MG per tablet   Oral   Take 1 tablet by mouth every 12 (twelve) hours.   14 tablet   0   . insulin glargine (LANTUS) 100 UNIT/ML injection   Subcutaneous   Inject 0.6 mLs (60 Units total) into the skin at bedtime.   60 mL   1 year   . nicotine (NICODERM CQ) 21 mg/24hr patch   Transdermal   Place 1 patch (21 mg total) onto the skin daily.   28 patch   0    BP 81/46  Pulse 98  Temp(Src) 98.7 F (37.1 C) (Oral)  Resp 16  SpO2 98% Physical Exam  Nursing note and vitals reviewed. Constitutional: She is oriented to person, place, and time. She appears well-developed and well-nourished.  HENT:   Head: Normocephalic and atraumatic.  Eyes: EOM are normal. Pupils are equal, round, and reactive to light.  Neck: Neck supple.  Cardiovascular: Normal rate, regular rhythm and normal heart sounds.   No murmur heard. Pulmonary/Chest: Effort normal. No respiratory distress.  Abdominal: Soft. She exhibits no distension. There is tenderness in the suprapubic area and left lower quadrant. There is no rebound, no guarding and no CVA tenderness.  Neurological: She is alert and oriented to person, place, and time.  Skin: Skin is warm and dry.    ED Course  Procedures (including critical care time) Labs Review Labs Reviewed  CBC WITH DIFFERENTIAL - Abnormal; Notable for the following:    WBC 16.5 (*)    Neutro Abs 11.9 (*)    Monocytes Absolute 1.1 (*)    All other components within normal limits  BASIC METABOLIC PANEL - Abnormal; Notable for the following:    Sodium 131 (*)    Glucose, Bld 164 (*)    All other components within normal limits  MAGNESIUM  PHOSPHORUS  URINALYSIS, ROUTINE W REFLEX MICROSCOPIC   Imaging Review No results found.  EKG Interpretation   None       MDM  No diagnosis found.  Pt comes in with cc of n/v/abd pain. Pt is dry on exam, slightly tachycardic. Orthostatics are +. Abd pain x 10 days, CT abd was neg on 12/5. Possibly related to her UTI. Will admit for optimization and hydration.  Derwood Kaplan, MD 06/19/13 1620

## 2013-06-19 NOTE — Telephone Encounter (Signed)
Pt called in today because she is having severe stomach problems; pt states that she is unable to keep food down and is in pain; pt says that she received a Cat scan recently and that nothing was found; pt is type II diabetes and is experiencing diarrhea also; please follow up w/ pt to let her know if she needs a visit

## 2013-06-19 NOTE — ED Notes (Addendum)
Patient presents to ED via EMS from [facility] for n/v intermittent since the day after thanksgiving. Clinic  gave 4mg  IV zofran, clinic gave 250cc NS.

## 2013-06-19 NOTE — Patient Instructions (Signed)
Pt transferred to [facility] via EMS Stable condition- BP rechecked 115/69

## 2013-06-20 ENCOUNTER — Inpatient Hospital Stay (HOSPITAL_COMMUNITY): Payer: Medicaid Other

## 2013-06-20 DIAGNOSIS — R112 Nausea with vomiting, unspecified: Secondary | ICD-10-CM | POA: Diagnosis present

## 2013-06-20 LAB — COMPREHENSIVE METABOLIC PANEL
ALT: 6 U/L (ref 0–35)
AST: 14 U/L (ref 0–37)
Alkaline Phosphatase: 92 U/L (ref 39–117)
BUN: 13 mg/dL (ref 6–23)
CO2: 20 mEq/L (ref 19–32)
Calcium: 8.7 mg/dL (ref 8.4–10.5)
Chloride: 103 mEq/L (ref 96–112)
Creatinine, Ser: 0.34 mg/dL — ABNORMAL LOW (ref 0.50–1.10)
GFR calc Af Amer: >90 mL/min (ref >90)
GFR calc non Af Amer: >90 mL/min (ref >90)
Glucose, Bld: 75 mg/dL (ref 70–99)
Potassium: 4 mEq/L (ref 3.5–5.1)
Sodium: 137 mEq/L (ref 135–145)
Total Bilirubin: 0.5 mg/dL (ref 0.3–1.2)

## 2013-06-20 LAB — SURGICAL PCR SCREEN: Staphylococcus aureus: NEGATIVE

## 2013-06-20 LAB — GLUCOSE, CAPILLARY
Glucose-Capillary: 164 mg/dL — ABNORMAL HIGH (ref 70–99)
Glucose-Capillary: 89 mg/dL (ref 70–99)
Glucose-Capillary: 90 mg/dL (ref 70–99)
Glucose-Capillary: 94 mg/dL (ref 70–99)

## 2013-06-20 LAB — URINE CULTURE: Colony Count: NO GROWTH

## 2013-06-20 LAB — TSH: TSH: 0.877 u[IU]/mL (ref 0.350–4.500)

## 2013-06-20 LAB — CBC
HCT: 37.6 % (ref 36.0–46.0)
Hemoglobin: 12.9 g/dL (ref 12.0–15.0)
MCH: 32.2 pg (ref 26.0–34.0)
MCHC: 34.3 g/dL (ref 30.0–36.0)
RDW: 13.1 % (ref 11.5–15.5)
WBC: 10.9 10*3/uL — ABNORMAL HIGH (ref 4.0–10.5)

## 2013-06-20 MED ORDER — PNEUMOCOCCAL VAC POLYVALENT 25 MCG/0.5ML IJ INJ
0.5000 mL | INJECTION | INTRAMUSCULAR | Status: AC
  Administered 2013-06-21: 0.5 mL via INTRAMUSCULAR
  Filled 2013-06-20: qty 0.5

## 2013-06-20 MED ORDER — TECHNETIUM TC 99M SULFUR COLLOID: 2.0000 | Freq: Once | INTRAVENOUS | Status: AC | PRN

## 2013-06-20 MED ORDER — TECHNETIUM TC 99M SULFUR COLLOID
2.0000 | Freq: Once | INTRAVENOUS | Status: AC | PRN
  Administered 2013-06-20: 2 via ORAL

## 2013-06-20 MED ORDER — INFLUENZA VAC SPLIT QUAD 0.5 ML IM SUSP
0.5000 mL | INTRAMUSCULAR | Status: AC
  Administered 2013-06-21: 0.5 mL via INTRAMUSCULAR
  Filled 2013-06-20: qty 0.5

## 2013-06-20 NOTE — Progress Notes (Signed)
Utilization review completed.  

## 2013-06-20 NOTE — Progress Notes (Signed)
Subjective: Feeling better with pain medication.  No vomiting, but she is nauseous.  Objective: Vital signs in last 24 hours: Temp:  [98 F (36.7 C)-98.7 F (37.1 C)] 98 F (36.7 C) (12/10 0519) Pulse Rate:  [77-110] 77 (12/10 0519) Resp:  [16-22] 18 (12/10 0519) BP: (73-148)/(46-92) 122/58 mmHg (12/10 0519) SpO2:  [97 %-100 %] 98 % (12/10 0519) Last BM Date: 06/18/13  Intake/Output from previous day: 12/09 0701 - 12/10 0700 In: 1350 [I.V.:1350] Out: 6 [Urine:6] Intake/Output this shift:    General appearance: alert and no distress Resp: +wheezing, but improved Cardio: regular rate and rhythm GI: decreased tenderness with palpation, +bruit  Lab Results:  Recent Labs  06/19/13 1412 06/20/13 0435  WBC 16.5* 10.9*  HGB 14.5 12.9  HCT 40.6 37.6  PLT 293 273   BMET  Recent Labs  06/19/13 1412 06/20/13 0435  NA 131* 137  K 4.6 4.0  CL 97 103  CO2 22 20  GLUCOSE 164* 75  BUN 18 13  CREATININE 0.50 0.34*  CALCIUM 9.3 8.7   LFT  Recent Labs  06/20/13 0435  PROT 6.0  ALBUMIN 3.1*  AST 14  ALT 6  ALKPHOS 92  BILITOT 0.5   PT/INR No results found for this basename: LABPROT, INR,  in the last 72 hours Hepatitis Panel No results found for this basename: HEPBSAG, HCVAB, HEPAIGM, HEPBIGM,  in the last 72 hours C-Diff No results found for this basename: CDIFFTOX,  in the last 72 hours Fecal Lactopherrin No results found for this basename: FECLLACTOFRN,  in the last 72 hours  Studies/Results: No results found.  Medications:  Scheduled: . enoxaparin (LOVENOX) injection  40 mg Subcutaneous Q24H  . insulin aspart  0-15 Units Subcutaneous Q4H  . nicotine  21 mg Transdermal Daily  . pantoprazole (PROTONIX) IV  40 mg Intravenous Q12H   Continuous: . sodium chloride 150 mL/hr at 06/19/13 2130    Assessment/Plan: 1) ? Mesenteric ischemia.  Severe atherosclerosis noted on the CT scan. 2) ABM pain. 3) Weight loss. 4) Nausea/Vomiting.   I was able to  obtain more history now that the patient's pain is under control.  She reports losing 100 lbs over the past year.  Most of that weight loss was intentional when she started to walk and watch her diet, however, she does not know how much weight she's lost since Thanksgiving as a result of her symptoms.   There is a prominent bruit in her abdomen with ascultation.  Plan: 1) Vascular Surgery consultation.   2) If vascular does not feel that her symptoms can be explained by her atherosclerosis and EGD+/- colonoscopy will be pursued.  I am not sure if she can tolerate the prep with such a significant history of nausea/vomiting.  LOS: 1 day   Kerryann Allaire D 06/20/2013, 7:56 AM

## 2013-06-20 NOTE — Progress Notes (Signed)
[facility] PROGRESS NOTE  Sarah Alvarez ZOX:[id number] DOB: 1950-10-03 DOA: 06/19/2013 PCP: Doris Cheadle, MD  Assessment/Plan: 1. Intractable nausea/vomiting. Patient presenting with multiple episodes of nausea and vomiting, unable to keep by mouth down, becoming dehydrated. Discussed with GI. Concerns for possible mesenteric ischemia with recs for Vascular Surgery consult. 2. Dehydration. Patient clinically dehydrated, presenting with hypotension that responded to IV fluid resuscitation. Will continue IV fluid resuscitation as tolerated. 3. Hyponatremia. Patient's lab work showing a sodium of 131 which could be secondary to hypotonic hypovolemic hyponatremia. Resolved 4. History of hypertension. Hypotension resolved 5. Type 2 diabetes mellitus. Will discontinue hypoglycemics as patient has not taken in by mouth, place her on Accu-Cheks every 4 hours with sliding scale coverage. 6. Nutrition. N.p.o. 7. DVT prophylaxis. Lovenox  Code Status: Full Family Communication: Pt in room (indicate person spoken with, relationship, and if by phone, the number) Disposition Plan: Pending  Consultants:  GI  HPI/Subjective: No acute events noted overnight  Objective: Filed Vitals:   06/19/13 2000 06/19/13 2035 06/19/13 2056 06/20/13 0519  BP: 129/92  130/75 122/58  Pulse: 99  90 77  Temp:  98.4 F (36.9 C) 98.1 F (36.7 C) 98 F (36.7 C)  TempSrc:  Oral    Resp:  18 18 18   SpO2: 98%  98% 98%    Intake/Output Summary (Last 24 hours) at 06/20/13 8119 Last data filed at 06/20/13 0640  Gross per 24 hour  Intake   1350 ml  Output      6 ml  Net   1344 ml   There were no vitals filed for this visit.  Exam:   General:  Awake, in nad  Cardiovascular: regular, s1, s2  Respiratory: normal resp effort, no wheezing  Abdomen: soft, pos BS  Musculoskeletal: perfused, no clubbing   Data Reviewed: Basic Metabolic Panel:  Recent Labs Lab 06/14/13 1827 06/19/13 1412  06/20/13 0435  NA 140 131* 137  K 3.2* 4.6 4.0  CL 103 97 103  CO2 24 22 20   GLUCOSE 56* 164* 75  BUN 15 18 13   CREATININE 0.51 0.50 0.34*  CALCIUM 9.6 9.3 8.7  MG  --  1.7  --   PHOS  --  3.5  --    Liver Function Tests:  Recent Labs Lab 06/14/13 1827 06/20/13 0435  AST 12 14  ALT 6 6  ALKPHOS 97 92  BILITOT 0.2* 0.5  PROT 6.6 6.0  ALBUMIN 3.6 3.1*    Recent Labs Lab 06/14/13 1827  LIPASE 29   No results found for this basename: AMMONIA,  in the last 168 hours CBC:  Recent Labs Lab 06/14/13 1827 06/19/13 1412 06/20/13 0435  WBC 15.0* 16.5* 10.9*  NEUTROABS 11.2* 11.9*  --   HGB 13.7 14.5 12.9  HCT 39.5 40.6 37.6  MCV 92.7 92.3 93.8  PLT 399 293 273   Cardiac Enzymes: No results found for this basename: CKTOTAL, CKMB, CKMBINDEX, TROPONINI,  in the last 168 hours BNP (last 3 results) No results found for this basename: PROBNP,  in the last 8760 hours CBG:  Recent Labs Lab 06/14/13 2158 06/19/13 2133 06/19/13 2358 06/20/13 0353 06/20/13 0805  GLUCAP 71 189* 164* 89 94    Recent Results (from the past 240 hour(s))  URINE CULTURE     Status: None   Collection Time    06/14/13  6:07 PM      Result Value Range Status   Specimen Description URINE, CLEAN CATCH   Final  Special Requests Normal   Final   Culture  Setup Time     Final   Value: 06/14/2013 21:18     Performed at [facility] Count     Final   Value: NO GROWTH     Performed at [facility]   Culture     Final   Value: NO GROWTH     Performed at [facility]   Report Status 06/15/2013 FINAL   Final  SURGICAL PCR SCREEN     Status: None   Collection Time    06/20/13 12:41 AM      Result Value Range Status   MRSA, PCR NEGATIVE  NEGATIVE Final   Staphylococcus aureus NEGATIVE  NEGATIVE Final   Comment:            The Xpert SA Assay (FDA     approved for NASAL specimens     in patients over 9 years of age),     is one component of     a  comprehensive surveillance     program.  Test performance has     been validated by [employer] for patients greater     than or equal to 90 year old.     It is not intended     to diagnose infection nor to     guide or monitor treatment.     Studies: No results found.  Scheduled Meds: . enoxaparin (LOVENOX) injection  40 mg Subcutaneous Q24H  . insulin aspart  0-15 Units Subcutaneous Q4H  . nicotine  21 mg Transdermal Daily  . pantoprazole (PROTONIX) IV  40 mg Intravenous Q12H   Continuous Infusions: . sodium chloride 150 mL/hr at 06/19/13 2130    Active Problems:   DM (diabetes mellitus)   Essential hypertension, benign   Other and unspecified hyperlipidemia   History of UTI   Dehydration   Nausea & vomiting   Gastroparesis  Time spent:  Jaecob Lowden K  [facility] Pager [phone]. If 7PM-7AM, please contact night-coverage at [email or web address], password [facility] 06/20/2013, 8:12 AM  LOS: 1 day

## 2013-06-21 ENCOUNTER — Encounter (HOSPITAL_COMMUNITY): Payer: Self-pay | Admitting: Radiology

## 2013-06-21 ENCOUNTER — Ambulatory Visit: Payer: Self-pay

## 2013-06-21 DIAGNOSIS — R109 Unspecified abdominal pain: Secondary | ICD-10-CM

## 2013-06-21 DIAGNOSIS — K551 Chronic vascular disorders of intestine: Secondary | ICD-10-CM

## 2013-06-21 DIAGNOSIS — R0989 Other specified symptoms and signs involving the circulatory and respiratory systems: Secondary | ICD-10-CM

## 2013-06-21 LAB — GLUCOSE, CAPILLARY
Glucose-Capillary: 126 mg/dL — ABNORMAL HIGH (ref 70–99)
Glucose-Capillary: 127 mg/dL — ABNORMAL HIGH (ref 70–99)
Glucose-Capillary: 162 mg/dL — ABNORMAL HIGH (ref 70–99)
Glucose-Capillary: 172 mg/dL — ABNORMAL HIGH (ref 70–99)
Glucose-Capillary: 97 mg/dL (ref 70–99)

## 2013-06-21 LAB — PROTIME-INR: Prothrombin Time: 12.1 seconds (ref 11.6–15.2)

## 2013-06-21 MED ORDER — BENAZEPRIL HCL 10 MG PO TABS
10.0000 mg | ORAL_TABLET | Freq: Every day | ORAL | Status: DC
  Administered 2013-06-21 – 2013-06-25 (×5): 10 mg via ORAL
  Filled 2013-06-21 (×5): qty 1

## 2013-06-21 MED ORDER — ENOXAPARIN SODIUM 40 MG/0.4ML ~~LOC~~ SOLN
40.0000 mg | SUBCUTANEOUS | Status: DC
  Filled 2013-06-21: qty 0.4

## 2013-06-21 MED ORDER — ENOXAPARIN SODIUM 40 MG/0.4ML ~~LOC~~ SOLN
40.0000 mg | SUBCUTANEOUS | Status: DC
  Administered 2013-06-22 – 2013-06-24 (×3): 40 mg via SUBCUTANEOUS
  Filled 2013-06-21 (×4): qty 0.4

## 2013-06-21 MED ORDER — ASPIRIN 325 MG PO TABS
325.0000 mg | ORAL_TABLET | Freq: Every day | ORAL | Status: DC
  Administered 2013-06-21 – 2013-06-25 (×5): 325 mg via ORAL
  Filled 2013-06-21 (×5): qty 1

## 2013-06-21 NOTE — Consult Note (Signed)
[facility]  Patient name: Sarah Alvarez MRN: [id number] DOB: [birth year] Sex: female  REASON FOR CONSULT: Possible mesenteric ischemia. Consult is from [facility].  HPI: Sarah Alvarez is a 62 y.o. female who was admitted on 06/19/2013 with nausea and vomiting. According to the patient, her symptoms began Thanksgiving initially when she had nausea and vomiting. This is now the second episode with similar symptoms. She describes some upper abdominal pain associated with this and her pain is worse after eating. On careful questioning she does note the gradual onset of postprandial abdominal pain over the last 2 months. She experiences pain essentially after eating every time. She's had gradual weight loss over the last year. Her associated symptoms with her current presentation include some diarrhea which was not bloody.  She has multiple risk factors for peripheral vascular disease including diabetes, hypertension, hypercholesterolemia, and tobacco use. She smokes 2 packs per day and has been smoking since she was 62 years old.  Past Medical History  Diagnosis Date  . Diabetes mellitus   . Staph infection    Family History  Problem Relation Age of Onset  . Diabetes Mother   . Cancer Father   . Diabetes Sister   . Hypertension Sister   . Cancer Brother   . Cancer Maternal Aunt     breast cancer   . Diabetes Maternal Grandmother   . Diabetes Paternal Grandmother    SOCIAL HISTORY: History  Substance Use Topics  . Smoking status: Current Every Day Smoker -- 1.00 packs/day for 20 years  . Smokeless tobacco: Never Used  . Alcohol Use: No   No Known Allergies  Current Facility-Administered Medications  Medication Dose Route Frequency Provider Last Rate Last Dose  . 0.9 %  sodium chloride infusion   Intravenous Continuous Jerald Kief, MD 100 mL/hr at 06/20/13 0905 100 mL/hr at 06/20/13 0905  . acetaminophen (TYLENOL) tablet 650 mg  650 mg Oral  Q6H PRN Jeralyn Bennett, MD       Or  . acetaminophen (TYLENOL) suppository 650 mg  650 mg Rectal Q6H PRN Jeralyn Bennett, MD      . benazepril (LOTENSIN) tablet 10 mg  10 mg Oral Daily Jerald Kief, MD      . enoxaparin (LOVENOX) injection 40 mg  40 mg Subcutaneous Q24H Jeralyn Bennett, MD   40 mg at 06/20/13 2203  . HYDROcodone-acetaminophen (NORCO/VICODIN) 5-325 MG per tablet 1 tablet  1 tablet Oral Q4H PRN Jeralyn Bennett, MD      . influenza vac split quadrivalent PF (FLUARIX) injection 0.5 mL  0.5 mL Intramuscular Tomorrow-1000 Doren Custard, RN      . insulin aspart (novoLOG) injection 0-15 Units  0-15 Units Subcutaneous Q4H Jeralyn Bennett, MD   2 Units at 06/21/13 [phone]  . morphine 2 MG/ML injection 2 mg  2 mg Intravenous Q4H PRN Jeralyn Bennett, MD   2 mg at 06/21/13 0858  . nicotine (NICODERM CQ - dosed in mg/24 hours) patch 21 mg  21 mg Transdermal Daily Jeralyn Bennett, MD   21 mg at 06/20/13 1128  . ondansetron (ZOFRAN) tablet 4 mg  4 mg Oral Q6H PRN Jeralyn Bennett, MD       Or  . ondansetron (ZOFRAN) injection 4 mg  4 mg Intravenous Q6H PRN Jeralyn Bennett, MD      . pantoprazole (PROTONIX) injection 40 mg  40 mg Intravenous Q12H Jeralyn Bennett, MD   40 mg at 06/20/13 2204  .  pneumococcal 23 valent vaccine (PNU-IMMUNE) injection 0.5 mL  0.5 mL Intramuscular Tomorrow-1000 Doren Custard, RN       REVIEW OF SYSTEMS: Arly.Keller ] denotes positive finding; [  ] denotes negative finding CARDIOVASCULAR:  [ ]  chest pain   [ ]  chest pressure   [ ]  palpitations   [ ]  orthopnea   Arly.Keller ] dyspnea on exertion   [ ]  claudication   [ ]  rest pain   [ ]  DVT   [ ]  phlebitis PULMONARY:   [ ]  productive cough   [ ]  asthma   [ ]  wheezing NEUROLOGIC:   [ ]  weakness  [ ]  paresthesias  [ ]  aphasia  [ ]  amaurosis  [ ]  dizziness HEMATOLOGIC:   [ ]  bleeding problems   [ ]  clotting disorders MUSCULOSKELETAL:  Arly.Keller ] joint pain- hands   [ ]  joint swelling [ ]  leg swelling GASTROINTESTINAL: [ ]   blood in stool   [ ]   hematemesis GENITOURINARY:  [ ]   dysuria  [ ]   hematuria PSYCHIATRIC:  [ ]  history of major depression INTEGUMENTARY:  [ ]  rashes  [ ]  ulcers CONSTITUTIONAL:  [ ]  fever   [ ]  chills  PHYSICAL EXAM: Filed Vitals:   06/20/13 0519 06/20/13 1400 06/20/13 2041 06/21/13 0426  BP: 122/58 154/67 153/68 158/66  Pulse: 77 82 80 70  Temp: 98 F (36.7 C) 98.3 F (36.8 C) 97.3 F (36.3 C) 97.6 F (36.4 C)  TempSrc:   Oral Oral  Resp: 18 16 16 16   SpO2: 98% 100% 100% 100%   There is no weight on file to calculate BMI. GENERAL: The patient is a well-nourished female, in no acute distress. The vital signs are documented above. CARDIOVASCULAR: There is a regular rate and rhythm. She has bilateral carotid bruits. She has palpable femoral, popliteal, dorsalis pedis, and posterior tibial pulses bilaterally. She has palpable radial pulses bilaterally. PULMONARY: There is good air exchange bilaterally without wheezing or rales. ABDOMEN: Soft and non-tender with normal pitched bowel sounds. She has an abdominal bruit. I do not palpate an abdominal aortic aneurysm. MUSCULOSKELETAL: There are no major deformities or cyanosis. NEUROLOGIC: No focal weakness or paresthesias are detected. SKIN: There are no ulcers or rashes noted. PSYCHIATRIC: The patient has a normal affect.  DATA:  Lab Results  Component Value Date   WBC 10.9* 06/20/2013   HGB 12.9 06/20/2013   HCT 37.6 06/20/2013   MCV 93.8 06/20/2013   PLT 273 06/20/2013   Lab Results  Component Value Date   NA 137 06/20/2013   K 4.0 06/20/2013   CL 103 06/20/2013   CO2 20 06/20/2013   Lab Results  Component Value Date   CREATININE 0.34* 06/20/2013   Lab Results  Component Value Date   HGBA1C 9.0 06/12/2013   CBG (last 3)   Recent Labs  06/21/13 0037 06/21/13 0428 06/21/13 0810  GLUCAP 126* 97 127*   CT OF THE ABDOMEN AND PELVIS: A CT of the abdomen and pelvis with IV contrast on 06/14/2013, shows extensive atherosclerotic  calcification involving the proximal celiac artery, proximal superior mesenteric artery, and renal arteries. I do not appreciate a significant celiac stenosis on the CT scan. 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 It appears that the IMA is patent. 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 The hypogastric arteries are patent.  Of note she is also noted to have calcifications of her coronary arteries.  She also has a bulging disc at L5-S1.   MEDICAL ISSUES:  POSSIBLE CHRONIC MESENTERIC ISCHEMIA: This patient has postprandial abdominal pain and significant weight loss. Given her multiple risk factors for vascular disease, certainly she could have chronic mesenteric ischemia. 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 I would recommend that she undergo mesenteric arteriography in order to further assess her mesenteric circulation. She is being hydrated as she presented initially significantly dehydrated. Currently her creatinine is normal. If she does have significant mesenteric artery occlusive disease which could explain her symptoms, she really does not have any good surgical options for revascularization. Her aorta is significantly calcified and her iliac arteries are diffusely diseased also. Even her supraceliac aorta has significant calcium. I will recommend that she undergo mesenteric arteriography by interventional radiology, if she is a candidate for mesenteric artery PTA and stenting this could potentially be done at the same time. I personally did not have experience with mesenteric artery PTA and stenting. If there were not any options from an endovascular standpoint, and she needs to be considered for surgery, she would need preoperative cardiac evaluation given her previous cardiac stents which were done in  and significant calcific disease of her  coronary artery seen on CT scan.  BILATERAL CAROTID BRUITS: The patient is asymptomatic. I have ordered a carotid duplex scan to rule out any significant carotid disease which would put her at increased risk for stroke. I do not see that the patient is on aspirin. I will order aspirin. I also do not see that she is on a statin. I would recommend starting a statin.   Onyx Schirmer S [facility] [place] [facility]: [phone]

## 2013-06-21 NOTE — Progress Notes (Signed)
[facility] PROGRESS NOTE  Sarah Alvarez ZOX:[id number] DOB: 1951-02-15 DOA: 06/19/2013 PCP: Doris Cheadle, MD  Assessment/Plan: 1. Intractable nausea/vomiting. Patient presenting with multiple episodes of nausea and vomiting, unable to keep by mouth down, becoming dehydrated. Discussed with GI. Concerns for possible mesenteric ischemia with recs for Vascular Surgery consult. Vasc surg consulted for recs. 2. Dehydration. Patient clinically dehydrated, presenting with hypotension that responded to IV fluid resuscitation. 3. Hyponatremia. Patient's lab work showing a sodium of 131 which could be secondary to hypotonic hypovolemic hyponatremia. Resolved 4. History of hypertension. Hypotension resolved. Will resume ACEI. 5. Type 2 diabetes mellitus. Will discontinue hypoglycemics as patient has not taken in by mouth, place her on Accu-Cheks every 4 hours with sliding scale coverage. 6. Nutrition. N.p.o. 7. DVT prophylaxis. Lovenox  Code Status: Full Family Communication: Pt in room (indicate person spoken with, relationship, and if by phone, the number) Disposition Plan: Pending  Consultants:  GI  Vascular surgery  HPI/Subjective: Cont with discomfort after eating  Objective: Filed Vitals:   06/20/13 0519 06/20/13 1400 06/20/13 2041 06/21/13 0426  BP: 122/58 154/67 153/68 158/66  Pulse: 77 82 80 70  Temp: 98 F (36.7 C) 98.3 F (36.8 C) 97.3 F (36.3 C) 97.6 F (36.4 C)  TempSrc:   Oral Oral  Resp: 18 16 16 16   SpO2: 98% 100% 100% 100%    Intake/Output Summary (Last 24 hours) at 06/21/13 0806 Last data filed at 06/21/13 0428  Gross per 24 hour  Intake   1040 ml  Output      4 ml  Net   1036 ml   There were no vitals filed for this visit.  Exam:   General:  Awake, in nad  Cardiovascular: regular, s1, s2  Respiratory: normal resp effort, no wheezing  Abdomen: soft, pos BS  Musculoskeletal: perfused, no clubbing   Data Reviewed: Basic Metabolic  Panel:  Recent Labs Lab 06/14/13 1827 06/19/13 1412 06/20/13 0435  NA 140 131* 137  K 3.2* 4.6 4.0  CL 103 97 103  CO2 24 22 20   GLUCOSE 56* 164* 75  BUN 15 18 13   CREATININE 0.51 0.50 0.34*  CALCIUM 9.6 9.3 8.7  MG  --  1.7  --   PHOS  --  3.5  --    Liver Function Tests:  Recent Labs Lab 06/14/13 1827 06/20/13 0435  AST 12 14  ALT 6 6  ALKPHOS 97 92  BILITOT 0.2* 0.5  PROT 6.6 6.0  ALBUMIN 3.6 3.1*    Recent Labs Lab 06/14/13 1827  LIPASE 29   No results found for this basename: AMMONIA,  in the last 168 hours CBC:  Recent Labs Lab 06/14/13 1827 06/19/13 1412 06/20/13 0435  WBC 15.0* 16.5* 10.9*  NEUTROABS 11.2* 11.9*  --   HGB 13.7 14.5 12.9  HCT 39.5 40.6 37.6  MCV 92.7 92.3 93.8  PLT 399 293 273   Cardiac Enzymes: No results found for this basename: CKTOTAL, CKMB, CKMBINDEX, TROPONINI,  in the last 168 hours BNP (last 3 results) No results found for this basename: PROBNP,  in the last 8760 hours CBG:  Recent Labs Lab 06/20/13 1204 06/20/13 1857 06/20/13 2043 06/21/13 0037 06/21/13 0428  GLUCAP 90 71 60* 126* 97    Recent Results (from the past 240 hour(s))  URINE CULTURE     Status: None   Collection Time    06/14/13  6:07 PM      Result Value Range Status   Specimen Description  URINE, CLEAN CATCH   Final   Special Requests Normal   Final   Culture  Setup Time     Final   Value: 06/14/2013 21:18     Performed at [facility] Count     Final   Value: NO GROWTH     Performed at [facility]   Culture     Final   Value: NO GROWTH     Performed at [facility]   Report Status 06/15/2013 FINAL   Final  URINE CULTURE     Status: None   Collection Time    06/19/13  5:51 PM      Result Value Range Status   Specimen Description URINE, CLEAN CATCH   Final   Special Requests NONE   Final   Culture  Setup Time     Final   Value: 06/19/2013 22:26     Performed at [facility] Count      Final   Value: NO GROWTH     Performed at [facility]   Culture     Final   Value: NO GROWTH     Performed at [facility]   Report Status 06/20/2013 FINAL   Final  SURGICAL PCR SCREEN     Status: None   Collection Time    06/20/13 12:41 AM      Result Value Range Status   MRSA, PCR NEGATIVE  NEGATIVE Final   Staphylococcus aureus NEGATIVE  NEGATIVE Final   Comment:            The Xpert SA Assay (FDA     approved for NASAL specimens     in patients over 76 years of age),     is one component of     a comprehensive surveillance     program.  Test performance has     been validated by [facility] for patients greater     than or equal to 20 year old.     It is not intended     to diagnose infection nor to     guide or monitor treatment.     Studies: Nm Gastric Emptying  06/20/2013   CLINICAL DATA:  Abdominal pain. Nausea, vomiting, reflux. History of diabetes.  EXAM: NUCLEAR MEDICINE GASTRIC EMPTYING SCAN  TECHNIQUE: After oral ingestion of radiolabeled meal, sequential abdominal images were obtained for 120 minutes. Residual percentage of activity remaining within the stomach was calculated at 60 and 120 minutes.  COMPARISON:  CT of the abdomen and pelvis on 06/14/2013  RADIOPHARMACEUTICALS:  2.0Technetium 99-M labeled sulfur colloid cooked with scrambled egg  FINDINGS: Expected location of the stomach in the left upper quadrant. Ingested meal empties the stomach gradually over the course of the study with 42 % retention at 120 min (normal retention less than 30% at a 120 min).  IMPRESSION: Delayed gastric emptying.   Electronically Signed   By: Rosalie Gums M.D.   On: 06/20/2013 17:53    Scheduled Meds: . enoxaparin (LOVENOX) injection  40 mg Subcutaneous Q24H  . influenza vac split quadrivalent PF  0.5 mL Intramuscular Tomorrow-1000  . insulin aspart  0-15 Units Subcutaneous Q4H  . nicotine  21 mg Transdermal Daily  . pantoprazole (PROTONIX) IV  40 mg  Intravenous Q12H  . pneumococcal 23 valent vaccine  0.5 mL Intramuscular Tomorrow-1000   Continuous Infusions: . sodium chloride 100 mL/hr (06/20/13 0905)  Active Problems:   DM (diabetes mellitus)   Essential hypertension, benign   Other and unspecified hyperlipidemia   History of UTI   Dehydration   Nausea & vomiting   Gastroparesis   Nausea and vomiting  Time spent:  Sarah Alvarez K  [facility] Pager [phone]. If 7PM-7AM, please contact night-coverage at [email or web address], password [facility] 06/21/2013, 8:06 AM  LOS: 2 days

## 2013-06-21 NOTE — H&P (Signed)
Chief Complaint: "Abdominal pain and weight loss." Referring Physician: Dr. Rhona Leavens HPI: Sarah Alvarez is an 62 y.o. female with PMHx of current tobacco abuse, she states she is quitting starting today. She also has a history of CAD s/p stents and has been on aspirin and plavix in the past with no bleeding complications. She states since march of this year she has been losing weight with decreased appetite, she states she has lost 100 lbs since march. She states since thanksgiving she has been having post prandial sharp abdominal pains associated with N/V, and diarrhea. She denies any active blood in her stool or urine. She presented to the ED 12/4 and treated for a UTI, a CT abd/pelvis was performed at that time. She presented again to the ED 12/9 for abdominal pain N/V, vascular surgery has evaluated the patient and IR has been consulted for a mesenteric arteriogram study with possible intervention. She denies any active chest pain or shortness of breath. She denies any fever or chills. She denies any allergy to iodinated contrast or any difficulty with previous sedation. She denies any history of sleep apnea.   Past Medical History:  Past Medical History  Diagnosis Date  . Diabetes mellitus   . Staph infection     Past Surgical History:  Past Surgical History  Procedure Laterality Date  . Cholecystectomy    . Abdominal hysterectomy      Family History:  Family History  Problem Relation Age of Onset  . Diabetes Mother   . Cancer Father   . Diabetes Sister   . Hypertension Sister   . Cancer Brother   . Cancer Maternal Aunt     breast cancer   . Diabetes Maternal Grandmother   . Diabetes Paternal Grandmother     Social History:  reports that she has been smoking.  She has never used smokeless tobacco. She reports that she does not drink alcohol or use illicit drugs.  Allergies: No Known Allergies    Medication List    ASK your doctor about these medications       atorvastatin 20  MG tablet  Commonly known as:  LIPITOR  Take 1 tablet (20 mg total) by mouth daily.     benazepril 10 MG tablet  Commonly known as:  LOTENSIN  Take 1 tablet (10 mg total) by mouth daily.     glipiZIDE 5 MG tablet  Commonly known as:  GLUCOTROL  Take 1 tablet (5 mg total) by mouth 2 (two) times daily before a meal.     HYDROcodone-acetaminophen 5-325 MG per tablet  Commonly known as:  NORCO/VICODIN  Take 2 tablets by mouth every 4 (four) hours as needed.     insulin glargine 100 UNIT/ML injection  Commonly known as:  LANTUS  Inject 0.6 mLs (60 Units total) into the skin at bedtime.     insulin NPH-regular (70-30) 100 UNIT/ML injection  Commonly known as:  NOVOLIN 70/30  Inject 30 Units into the skin 2 (two) times daily with a meal.     metFORMIN 500 MG (MOD) 24 hr tablet  Commonly known as:  GLUMETZA  Take 500 mg by mouth 2 (two) times daily with a meal.     nicotine 21 mg/24hr patch  Commonly known as:  NICODERM CQ  Place 1 patch (21 mg total) onto the skin daily.     promethazine 25 MG tablet  Commonly known as:  PHENERGAN  Take 1 tablet (25 mg total) by mouth every 6 (six)  hours as needed for nausea or vomiting.     SALONPAS ARTHRITIS PAIN RELIEF Pads  Apply 1 each topically at bedtime as needed (pain on hand).     sulfamethoxazole-trimethoprim 800-160 MG per tablet  Commonly known as:  SEPTRA DS  Take 1 tablet by mouth every 12 (twelve) hours.       Please HPI for pertinent positives, otherwise complete 10 system ROS negative.  Physical Exam: BP 144/70  Pulse 75  Temp(Src) 98.6 F (37 C) (Oral)  Resp 15  SpO2 97% There is no weight on file to calculate BMI.  General Appearance:  Alert, cooperative, no distress  Head:  Normocephalic, without obvious abnormality, atraumatic  Neck: Supple, symmetrical, trachea midline  Lungs:   Clear to auscultation bilaterally, no w/r/r, respirations unlabored without use of accessory muscles.  Chest Wall:  No tenderness or  deformity  Heart:  Regular rate and rhythm, S1, S2 normal, no murmur, rub or gallop.  Abdomen:   Soft, lower abdominal region tenderness bilaterally, non distended, (+) BS  Extremities: Extremities normal, atraumatic, no cyanosis or edema  Pulses: 2+ and symmetric  Neurologic: Normal affect, no gross deficits.   Results for orders placed during the hospital encounter of 06/19/13 (from the past 48 hour(s))  URINALYSIS, ROUTINE W REFLEX MICROSCOPIC     Status: Abnormal   Collection Time    06/19/13  5:51 PM      Result Value Range   Color, Urine YELLOW  YELLOW   APPearance HAZY (*) CLEAR   Specific Gravity, Urine 1.020  1.005 - 1.030   pH 6.0  5.0 - 8.0   Glucose, UA NEGATIVE  NEGATIVE mg/dL   Hgb urine dipstick NEGATIVE  NEGATIVE   Bilirubin Urine SMALL (*) NEGATIVE   Ketones, ur 15 (*) NEGATIVE mg/dL   Protein, ur NEGATIVE  NEGATIVE mg/dL   Urobilinogen, UA 0.2  0.0 - 1.0 mg/dL   Nitrite NEGATIVE  NEGATIVE   Leukocytes, UA SMALL (*) NEGATIVE  URINE MICROSCOPIC-ADD ON     Status: Abnormal   Collection Time    06/19/13  5:51 PM      Result Value Range   Squamous Epithelial / LPF MANY (*) RARE   WBC, UA 3-6  <3 WBC/hpf   Bacteria, UA FEW (*) RARE   Urine-Other MUCOUS PRESENT    URINE CULTURE     Status: None   Collection Time    06/19/13  5:51 PM      Result Value Range   Specimen Description URINE, CLEAN CATCH     Special Requests NONE     Culture  Setup Time       Value: 06/19/2013 22:26     Performed at [facility] Count       Value: NO GROWTH     Performed at [facility]   Culture       Value: NO GROWTH     Performed at [facility]   Report Status 06/20/2013 FINAL    GLUCOSE, CAPILLARY     Status: Abnormal   Collection Time    06/19/13  9:33 PM      Result Value Range   Glucose-Capillary 189 (*) 70 - 99 mg/dL  GLUCOSE, CAPILLARY     Status: Abnormal   Collection Time    06/19/13 11:58 PM      Result Value Range    Glucose-Capillary 164 (*) 70 - 99 mg/dL  SURGICAL PCR SCREEN  Status: None   Collection Time    06/20/13 12:41 AM      Result Value Range   MRSA, PCR NEGATIVE  NEGATIVE   Staphylococcus aureus NEGATIVE  NEGATIVE   Comment:            The Xpert SA Assay (FDA     approved for NASAL specimens     in patients over 58 years of age),     is one component of     a comprehensive surveillance     program.  Test performance has     been validated by [facility] for patients greater     than or equal to 100 year old.     It is not intended     to diagnose infection nor to     guide or monitor treatment.  GLUCOSE, CAPILLARY     Status: None   Collection Time    06/20/13  3:53 AM      Result Value Range   Glucose-Capillary 89  70 - 99 mg/dL  COMPREHENSIVE METABOLIC PANEL     Status: Abnormal   Collection Time    06/20/13  4:35 AM      Result Value Range   Sodium 137  135 - 145 mEq/L   Potassium 4.0  3.5 - 5.1 mEq/L   Chloride 103  96 - 112 mEq/L   CO2 20  19 - 32 mEq/L   Glucose, Bld 75  70 - 99 mg/dL   BUN 13  6 - 23 mg/dL   Creatinine, Ser 1.61 (*) 0.50 - 1.10 mg/dL   Calcium 8.7  8.4 - 09.6 mg/dL   Total Protein 6.0  6.0 - 8.3 g/dL   Albumin 3.1 (*) 3.5 - 5.2 g/dL   AST 14  0 - 37 U/L   ALT 6  0 - 35 U/L   Alkaline Phosphatase 92  39 - 117 U/L   Total Bilirubin 0.5  0.3 - 1.2 mg/dL   GFR calc non Af Amer >90  >90 mL/min   GFR calc Af Amer >90  >90 mL/min   Comment: (NOTE)     The eGFR has been calculated using the CKD EPI equation.     This calculation has not been validated in all clinical situations.     eGFR's persistently <90 mL/min signify possible Chronic Kidney     Disease.  CBC     Status: Abnormal   Collection Time    06/20/13  4:35 AM      Result Value Range   WBC 10.9 (*) 4.0 - 10.5 K/uL   RBC 4.01  3.87 - 5.11 MIL/uL   Hemoglobin 12.9  12.0 - 15.0 g/dL   HCT 04.5  40.9 - 81.1 %   MCV 93.8  78.0 - 100.0 fL   MCH 32.2  26.0 - 34.0 pg   MCHC 34.3  30.0  - 36.0 g/dL   RDW 91.4  78.2 - 95.6 %   Platelets 273  150 - 400 K/uL  TSH     Status: None   Collection Time    06/20/13  4:35 AM      Result Value Range   TSH 0.877  0.350 - 4.500 uIU/mL   Comment: Performed at [facility]  GLUCOSE, CAPILLARY     Status: None   Collection Time    06/20/13  8:05 AM      Result Value Range   Glucose-Capillary 94  70 - 99 mg/dL   Comment 1 Notify RN     Comment 2 Documented in Chart    GLUCOSE, CAPILLARY     Status: None   Collection Time    06/20/13 12:04 PM      Result Value Range   Glucose-Capillary 90  70 - 99 mg/dL   Comment 1 Notify RN     Comment 2 Documented in Chart    GLUCOSE, CAPILLARY     Status: None   Collection Time    06/20/13  6:57 PM      Result Value Range   Glucose-Capillary 71  70 - 99 mg/dL   Comment 1 Notify RN     Comment 2 Documented in Chart    GLUCOSE, CAPILLARY     Status: Abnormal   Collection Time    06/20/13  8:43 PM      Result Value Range   Glucose-Capillary 60 (*) 70 - 99 mg/dL  GLUCOSE, CAPILLARY     Status: Abnormal   Collection Time    06/21/13 12:37 AM      Result Value Range   Glucose-Capillary 126 (*) 70 - 99 mg/dL  GLUCOSE, CAPILLARY     Status: None   Collection Time    06/21/13  4:28 AM      Result Value Range   Glucose-Capillary 97  70 - 99 mg/dL  GLUCOSE, CAPILLARY     Status: Abnormal   Collection Time    06/21/13  8:10 AM      Result Value Range   Glucose-Capillary 127 (*) 70 - 99 mg/dL   Comment 1 Notify RN     Comment 2 Documented in Chart    GLUCOSE, CAPILLARY     Status: Abnormal   Collection Time    06/21/13 11:42 AM      Result Value Range   Glucose-Capillary 172 (*) 70 - 99 mg/dL   Comment 1 Notify RN     Comment 2 Documented in Chart     Nm Gastric Emptying  06/20/2013   CLINICAL DATA:  Abdominal pain. Nausea, vomiting, reflux. History of diabetes.  EXAM: NUCLEAR MEDICINE GASTRIC EMPTYING SCAN  TECHNIQUE: After oral ingestion of radiolabeled meal, sequential  abdominal images were obtained for 120 minutes. Residual percentage of activity remaining within the stomach was calculated at 60 and 120 minutes.  COMPARISON:  CT of the abdomen and pelvis on 06/14/2013  RADIOPHARMACEUTICALS:  2.0Technetium 99-M labeled sulfur colloid cooked with scrambled egg  FINDINGS: Expected location of the stomach in the left upper quadrant. Ingested meal empties the stomach gradually over the course of the study with 42 % retention at 120 min (normal retention less than 30% at a 120 min).  IMPRESSION: Delayed gastric emptying.   Electronically Signed   By: Rosalie Gums M.D.   On: 06/20/2013 17:53    Assessment/Plan Abdominal pain. Weight Loss. Possible mesenteric ischemia, CT 06/14/13 History of CAD s/p stents Dyslipidemia. DM. Request for image guided mesenteric arteriogram with possible balloon/stenting.  Tobacco abuse, education and encouragement of smoking cessation discussed today. Patient will be NPO after midnight, lovenox held. Risks and Benefits discussed with the patient. All of the patient's questions were answered, patient is agreeable to proceed. Consent signed and in chart.   Pattricia Boss D PA-C 06/21/2013, 3:45 PM

## 2013-06-21 NOTE — Progress Notes (Signed)
Subjective: Feeling well at this time.  The pain medication is helping with her abdominal pain.  Objective: Vital signs in last 24 hours: Temp:  [97.3 F (36.3 C)-98.6 F (37 C)] 98.6 F (37 C) (12/11 1400) Pulse Rate:  [70-80] 75 (12/11 1400) Resp:  [15-16] 15 (12/11 1400) BP: (144-158)/(66-70) 144/70 mmHg (12/11 1400) SpO2:  [97 %-100 %] 97 % (12/11 1400) Last BM Date: 06/18/13  Intake/Output from previous day: 12/10 0701 - 12/11 0700 In: 1040 [P.O.:240; I.V.:800] Out: 4 [Urine:4] Intake/Output this shift: Total I/O In: -  Out: 150 [Urine:150]  General appearance: alert and no distress GI: soft, non-tender; bowel sounds normal; no masses,  no organomegaly  Lab Results:  Recent Labs  06/19/13 1412 06/20/13 0435  WBC 16.5* 10.9*  HGB 14.5 12.9  HCT 40.6 37.6  PLT 293 273   BMET  Recent Labs  06/19/13 1412 06/20/13 0435  NA 131* 137  K 4.6 4.0  CL 97 103  CO2 22 20  GLUCOSE 164* 75  BUN 18 13  CREATININE 0.50 0.34*  CALCIUM 9.3 8.7   LFT  Recent Labs  06/20/13 0435  PROT 6.0  ALBUMIN 3.1*  AST 14  ALT 6  ALKPHOS 92  BILITOT 0.5   PT/INR No results found for this basename: LABPROT, INR,  in the last 72 hours Hepatitis Panel No results found for this basename: HEPBSAG, HCVAB, HEPAIGM, HEPBIGM,  in the last 72 hours C-Diff No results found for this basename: CDIFFTOX,  in the last 72 hours Fecal Lactopherrin No results found for this basename: FECLLACTOFRN,  in the last 72 hours  Studies/Results: Nm Gastric Emptying  06/20/2013   CLINICAL DATA:  Abdominal pain. Nausea, vomiting, reflux. History of diabetes.  EXAM: NUCLEAR MEDICINE GASTRIC EMPTYING SCAN  TECHNIQUE: After oral ingestion of radiolabeled meal, sequential abdominal images were obtained for 120 minutes. Residual percentage of activity remaining within the stomach was calculated at 60 and 120 minutes.  COMPARISON:  CT of the abdomen and pelvis on 06/14/2013  RADIOPHARMACEUTICALS:   2.0Technetium 99-M labeled sulfur colloid cooked with scrambled egg  FINDINGS: Expected location of the stomach in the left upper quadrant. Ingested meal empties the stomach gradually over the course of the study with 42 % retention at 120 min (normal retention less than 30% at a 120 min).  IMPRESSION: Delayed gastric emptying.   Electronically Signed   By: Rosalie Gums M.D.   On: 06/20/2013 17:53    Medications:  Scheduled: . aspirin  325 mg Oral Daily  . benazepril  10 mg Oral Daily  . [START ON 06/22/2013] enoxaparin (LOVENOX) injection  40 mg Subcutaneous Q24H  . insulin aspart  0-15 Units Subcutaneous Q4H  . nicotine  21 mg Transdermal Daily  . pantoprazole (PROTONIX) IV  40 mg Intravenous Q12H   Continuous: . sodium chloride 100 mL/hr (06/20/13 0905)    Assessment/Plan: 1) Probable chronic mesenteric ischemia. 2) Delayed gastric emptying.   Vascular Surgery consultation much appreciated.  She is to undergo revascularization with IR tomorrow.  From the GI standpoint I will hold on any intervention.  I hope the procedure will help to resolve her issues.  As for her delayed gastric emptying, it is rather mild.  I would not pursue any treatment for this issue until the results of the revacularization can be determined.  Plan: 1) IR revascularization. 2) I will follow along.  LOS: 2 days   Immanuel Fedak D 06/21/2013, 4:50 PM

## 2013-06-22 ENCOUNTER — Inpatient Hospital Stay (HOSPITAL_COMMUNITY): Payer: Medicaid Other

## 2013-06-22 ENCOUNTER — Other Ambulatory Visit: Payer: Self-pay | Admitting: Radiology

## 2013-06-22 DIAGNOSIS — R0989 Other specified symptoms and signs involving the circulatory and respiratory systems: Secondary | ICD-10-CM

## 2013-06-22 DIAGNOSIS — K559 Vascular disorder of intestine, unspecified: Secondary | ICD-10-CM

## 2013-06-22 LAB — GLUCOSE, CAPILLARY
Glucose-Capillary: 140 mg/dL — ABNORMAL HIGH (ref 70–99)
Glucose-Capillary: 91 mg/dL (ref 70–99)
Glucose-Capillary: 92 mg/dL (ref 70–99)

## 2013-06-22 MED ORDER — HYDRALAZINE HCL 20 MG/ML IJ SOLN
5.0000 mg | INTRAMUSCULAR | Status: DC | PRN
  Administered 2013-06-22: 5 mg via INTRAVENOUS

## 2013-06-22 MED ORDER — FENTANYL CITRATE 0.05 MG/ML IJ SOLN
INTRAMUSCULAR | Status: AC | PRN
  Administered 2013-06-22: 25 ug via INTRAVENOUS
  Administered 2013-06-22: 50 ug via INTRAVENOUS

## 2013-06-22 MED ORDER — IOHEXOL 300 MG/ML  SOLN
150.0000 mL | Freq: Once | INTRAMUSCULAR | Status: AC | PRN
  Administered 2013-06-22: 50 mL via INTRA_ARTERIAL

## 2013-06-22 MED ORDER — HEPARIN SODIUM (PORCINE) 1000 UNIT/ML IJ SOLN
INTRAMUSCULAR | Status: AC
  Filled 2013-06-22: qty 1

## 2013-06-22 MED ORDER — MIDAZOLAM HCL 2 MG/2ML IJ SOLN
INTRAMUSCULAR | Status: AC
  Filled 2013-06-22: qty 4

## 2013-06-22 MED ORDER — HEPARIN SODIUM (PORCINE) 1000 UNIT/ML IJ SOLN
INTRAMUSCULAR | Status: AC | PRN
  Administered 2013-06-22: 3000 [IU] via INTRAVENOUS

## 2013-06-22 MED ORDER — CLOPIDOGREL BISULFATE 75 MG PO TABS
75.0000 mg | ORAL_TABLET | Freq: Every day | ORAL | Status: DC
  Administered 2013-06-23 – 2013-06-25 (×3): 75 mg via ORAL
  Filled 2013-06-22 (×4): qty 1

## 2013-06-22 MED ORDER — MIDAZOLAM HCL 2 MG/2ML IJ SOLN
INTRAMUSCULAR | Status: AC | PRN
  Administered 2013-06-22: 1 mg via INTRAVENOUS

## 2013-06-22 MED ORDER — FENTANYL CITRATE 0.05 MG/ML IJ SOLN
INTRAMUSCULAR | Status: AC
  Administered 2013-06-22: 11:00:00
  Filled 2013-06-22: qty 4

## 2013-06-22 MED ORDER — CLOPIDOGREL BISULFATE 75 MG PO TABS
150.0000 mg | ORAL_TABLET | Freq: Once | ORAL | Status: AC
  Administered 2013-06-22: 150 mg via ORAL
  Filled 2013-06-22: qty 2

## 2013-06-22 MED ORDER — HYDRALAZINE HCL 20 MG/ML IJ SOLN
INTRAMUSCULAR | Status: AC
  Filled 2013-06-22: qty 1

## 2013-06-22 NOTE — Progress Notes (Signed)
[facility] PROGRESS NOTE  Sarah Alvarez ZOX:[id number] DOB: 29-Mar-1951 DOA: 06/19/2013 PCP: Doris Cheadle, MD  Assessment/Plan: 1. Intractable nausea/vomiting. Patient presenting with multiple episodes of nausea and vomiting, unable to keep by mouth down, becoming dehydrated. Discussed with GI and Vasc Surgery. Concerns for mesenteric ischemia. For IR guided angiography today 2. Dehydration. Patient clinically dehydrated, presenting with hypotension that responded to IV fluid resuscitation. 3. Hyponatremia. Patient's lab work showing a sodium of 131 which could be secondary to hypotonic hypovolemic hyponatremia. Resolved 4. History of hypertension. Hypotension resolved. Will resume ACEI. 5. Type 2 diabetes mellitus. Will discontinue hypoglycemics as patient has not taken in by mouth, place her on Accu-Cheks every 4 hours with sliding scale coverage. 6. Nutrition. N.p.o. 7. DVT prophylaxis. Lovenox  Code Status: Full Family Communication: Pt in room (indicate person spoken with, relationship, and if by phone, the number) Disposition Plan: Pending  Consultants:  GI  Vascular surgery  IR  HPI/Subjective: No acute events noted overnight  Objective: Filed Vitals:   06/22/13 1140 06/22/13 1145 06/22/13 1150 06/22/13 1155  BP: 157/68 150/66 162/70 145/70  Pulse: 73 72 73 76  Temp:      TempSrc:      Resp: 23 10 13 10   Height:      Weight:      SpO2: 100% 100% 100% 100%   No intake or output data in the 24 hours ending 06/22/13 1203 Filed Weights   06/22/13 0700  Weight: 61.6 kg (135 lb 12.9 oz)    Exam:   General:  Awake, in nad  Cardiovascular: regular, s1, s2  Respiratory: normal resp effort, no wheezing  Abdomen: soft, pos BS  Musculoskeletal: perfused, no clubbing   Data Reviewed: Basic Metabolic Panel:  Recent Labs Lab 06/19/13 1412 06/20/13 0435  NA 131* 137  K 4.6 4.0  CL 97 103  CO2 22 20  GLUCOSE 164* 75  BUN 18 13  CREATININE 0.50  0.34*  CALCIUM 9.3 8.7  MG 1.7  --   PHOS 3.5  --    Liver Function Tests:  Recent Labs Lab 06/20/13 0435  AST 14  ALT 6  ALKPHOS 92  BILITOT 0.5  PROT 6.0  ALBUMIN 3.1*   No results found for this basename: LIPASE, AMYLASE,  in the last 168 hours No results found for this basename: AMMONIA,  in the last 168 hours CBC:  Recent Labs Lab 06/19/13 1412 06/20/13 0435  WBC 16.5* 10.9*  NEUTROABS 11.9*  --   HGB 14.5 12.9  HCT 40.6 37.6  MCV 92.3 93.8  PLT 293 273   Cardiac Enzymes: No results found for this basename: CKTOTAL, CKMB, CKMBINDEX, TROPONINI,  in the last 168 hours BNP (last 3 results) No results found for this basename: PROBNP,  in the last 8760 hours CBG:  Recent Labs Lab 06/21/13 1600 06/21/13 2049 06/22/13 0050 06/22/13 0437 06/22/13 0810  GLUCAP 136* 162* 140* 91 91    Recent Results (from the past 240 hour(s))  URINE CULTURE     Status: None   Collection Time    06/14/13  6:07 PM      Result Value Range Status   Specimen Description URINE, CLEAN CATCH   Final   Special Requests Normal   Final   Culture  Setup Time     Final   Value: 06/14/2013 21:18     Performed at [facility] Count     Final   Value: NO GROWTH  Performed at [facility]     Final   Value: NO GROWTH     Performed at [facility]   Report Status 06/15/2013 FINAL   Final  URINE CULTURE     Status: None   Collection Time    06/19/13  5:51 PM      Result Value Range Status   Specimen Description URINE, CLEAN CATCH   Final   Special Requests NONE   Final   Culture  Setup Time     Final   Value: 06/19/2013 22:26     Performed at [facility] Count     Final   Value: NO GROWTH     Performed at [facility]   Culture     Final   Value: NO GROWTH     Performed at [facility]   Report Status 06/20/2013 FINAL   Final  SURGICAL PCR SCREEN     Status: None   Collection Time    06/20/13  12:41 AM      Result Value Range Status   MRSA, PCR NEGATIVE  NEGATIVE Final   Staphylococcus aureus NEGATIVE  NEGATIVE Final   Comment:            The Xpert SA Assay (FDA     approved for NASAL specimens     in patients over 90 years of age),     is one component of     a comprehensive surveillance     program.  Test performance has     been validated by [facility] for patients greater     than or equal to 24 year old.     It is not intended     to diagnose infection nor to     guide or monitor treatment.     Studies: Nm Gastric Emptying  06/20/2013   CLINICAL DATA:  Abdominal pain. Nausea, vomiting, reflux. History of diabetes.  EXAM: NUCLEAR MEDICINE GASTRIC EMPTYING SCAN  TECHNIQUE: After oral ingestion of radiolabeled meal, sequential abdominal images were obtained for 120 minutes. Residual percentage of activity remaining within the stomach was calculated at 60 and 120 minutes.  COMPARISON:  CT of the abdomen and pelvis on 06/14/2013  RADIOPHARMACEUTICALS:  2.0Technetium 99-M labeled sulfur colloid cooked with scrambled egg  FINDINGS: Expected location of the stomach in the left upper quadrant. Ingested meal empties the stomach gradually over the course of the study with 42 % retention at 120 min (normal retention less than 30% at a 120 min).  IMPRESSION: Delayed gastric emptying.   Electronically Signed   By: Rosalie Gums M.D.   On: 06/20/2013 17:53    Scheduled Meds: . aspirin  325 mg Oral Daily  . benazepril  10 mg Oral Daily  . enoxaparin (LOVENOX) injection  40 mg Subcutaneous Q24H  . fentaNYL      . heparin      . insulin aspart  0-15 Units Subcutaneous Q4H  . midazolam      . nicotine  21 mg Transdermal Daily  . pantoprazole (PROTONIX) IV  40 mg Intravenous Q12H   Continuous Infusions: . sodium chloride 100 mL/hr at 06/21/13 2233    Active Problems:   DM (diabetes mellitus)   Essential hypertension, benign   Other and unspecified hyperlipidemia   History  of UTI   Dehydration   Nausea & vomiting   Gastroparesis   Nausea and vomiting  Carotid bruit   Chronic mesenteric ischemia  Time spent:  CHIU, STEPHEN K  [facility] Pager [phone]. If 7PM-7AM, please contact night-coverage at [email or web address], password [facility] 06/22/2013, 12:03 PM  LOS: 3 days

## 2013-06-22 NOTE — Procedures (Signed)
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

## 2013-06-22 NOTE — Progress Notes (Signed)
Bilateral carotid artery duplex completed.  Right:  Greater than 80% internal carotid artery stenosis.  Left:  1-39% ICA stenosis.  Bilateral:  Vertebral artery flow is antegrade.

## 2013-06-22 NOTE — Progress Notes (Signed)
Subjective: Pain is controlled with pain medications.  Feeling well at this time.  Objective: Vital signs in last 24 hours: Temp:  [97.7 F (36.5 C)-98.6 F (37 C)] 97.7 F (36.5 C) (12/12 0605) Pulse Rate:  [71-75] 71 (12/12 0605) Resp:  [15-16] 16 (12/12 0605) BP: (132-163)/(58-72) 150/68 mmHg (12/12 0613) SpO2:  [97 %-100 %] 98 % (12/12 0605) Last BM Date: 06/18/13  Intake/Output from previous day: 12/11 0701 - 12/12 0700 In: 480 [P.O.:480] Out: 151 [Urine:151] Intake/Output this shift:    General appearance: alert and no distress GI: mild abdominal pain  Lab Results:  Recent Labs  06/19/13 1412 06/20/13 0435  WBC 16.5* 10.9*  HGB 14.5 12.9  HCT 40.6 37.6  PLT 293 273   BMET  Recent Labs  06/19/13 1412 06/20/13 0435  NA 131* 137  K 4.6 4.0  CL 97 103  CO2 22 20  GLUCOSE 164* 75  BUN 18 13  CREATININE 0.50 0.34*  CALCIUM 9.3 8.7   LFT  Recent Labs  06/20/13 0435  PROT 6.0  ALBUMIN 3.1*  AST 14  ALT 6  ALKPHOS 92  BILITOT 0.5   PT/INR  Recent Labs  06/21/13 1700  LABPROT 12.1  INR 0.91   Hepatitis Panel No results found for this basename: HEPBSAG, HCVAB, HEPAIGM, HEPBIGM,  in the last 72 hours C-Diff No results found for this basename: CDIFFTOX,  in the last 72 hours Fecal Lactopherrin No results found for this basename: FECLLACTOFRN,  in the last 72 hours  Studies/Results: Nm Gastric Emptying  06/20/2013   CLINICAL DATA:  Abdominal pain. Nausea, vomiting, reflux. History of diabetes.  EXAM: NUCLEAR MEDICINE GASTRIC EMPTYING SCAN  TECHNIQUE: After oral ingestion of radiolabeled meal, sequential abdominal images were obtained for 120 minutes. Residual percentage of activity remaining within the stomach was calculated at 60 and 120 minutes.  COMPARISON:  CT of the abdomen and pelvis on 06/14/2013  RADIOPHARMACEUTICALS:  2.0Technetium 99-M labeled sulfur colloid cooked with scrambled egg  FINDINGS: Expected location of the stomach in the  left upper quadrant. Ingested meal empties the stomach gradually over the course of the study with 42 % retention at 120 min (normal retention less than 30% at a 120 min).  IMPRESSION: Delayed gastric emptying.   Electronically Signed   By: Rosalie Gums M.D.   On: 06/20/2013 17:53    Medications:  Scheduled: . aspirin  325 mg Oral Daily  . benazepril  10 mg Oral Daily  . enoxaparin (LOVENOX) injection  40 mg Subcutaneous Q24H  . insulin aspart  0-15 Units Subcutaneous Q4H  . nicotine  21 mg Transdermal Daily  . pantoprazole (PROTONIX) IV  40 mg Intravenous Q12H   Continuous: . sodium chloride 100 mL/hr at 06/21/13 2233    Assessment/Plan: 1) Probable chronic mesenteric ischemia. 2) Mildly delayed gastric emptying. 3) DM.   She is stable.  Again, no intervention at this time.  I will await the results of the revascularization of her SMA.  Plan: 1) IR intervention today.   LOS: 3 days   Kingdom Vanzanten D 06/22/2013, 8:25 AM

## 2013-06-22 NOTE — Progress Notes (Signed)
INITIAL NUTRITION ASSESSMENT  DOCUMENTATION CODES Per approved criteria  -Not Applicable   INTERVENTION:  Diet advancement as able post procedure today.  Recommend CHO-modified diet with Glucerna Shake TID.  NUTRITION DIAGNOSIS: Unintentional weight loss related to altered GI function as evidenced by 100 lb weight loss reported in the past 9 months.   Goal: Intake to meet >90% of estimated nutrition needs.  Monitor:  Diet advancement, PO intake, labs, weight trend.  Reason for Assessment: MST  62 y.o. female  Admitting Dx: Abdominal pain and weight loss.  ASSESSMENT: Patient presented to the ED on 12/4 and treated for a UTI. She presented again to the ED on 12/9 for abdominal pain and N/V, vascular surgery has evaluated the patient and IR was consulted for a mesenteric arteriogram study with possible intervention. Since March of this year she has been losing weight with decreased appetite, she has lost 100 lbs since march. Since thanksgiving she has been having post prandial sharp abdominal pains associated with N/V, and diarrhea.   Patient is currently in Interventional Radiology to undergo mesenteric arteriogram for suspected chronic mesenteric ischemia. Delayed gastric emptying is also suspected. Vascular surgery and GI are following. Unable to speak with patient or perform nutrition focused physical exam at this time. Patient is at nutrition risk, given reported severe 100 lb weight loss in the past 9 months.  Height: Ht Readings from Last 1 Encounters:  06/22/13 5\' 2"  (1.575 m)    Weight: Wt Readings from Last 1 Encounters:  06/22/13 135 lb 12.9 oz (61.6 kg)    Ideal Body Weight: 50 kg  % Ideal Body Weight: 123%  Wt Readings from Last 10 Encounters:  06/22/13 135 lb 12.9 oz (61.6 kg)  06/12/13 140 lb 6.4 oz (63.685 kg)    Usual Body Weight: ~235 lb ?  % Usual Body Weight: 57%  BMI:  Body mass index is 24.83 kg/(m^2). normal weight  Estimated Nutritional  Needs: Kcal: 1550-1750 Protein: 80-95 gm Fluid: 1.6-1.8 L  Skin: no wounds  Diet Order: NPO  EDUCATION NEEDS: -Education not appropriate at this time   Intake/Output Summary (Last 24 hours) at 06/22/13 1034 Last data filed at 06/21/13 1200  Gross per 24 hour  Intake    480 ml  Output      0 ml  Net    480 ml    Last BM: 12/8   Labs:   Recent Labs Lab 06/19/13 1412 06/20/13 0435  NA 131* 137  K 4.6 4.0  CL 97 103  CO2 22 20  BUN 18 13  CREATININE 0.50 0.34*  CALCIUM 9.3 8.7  MG 1.7  --   PHOS 3.5  --   GLUCOSE 164* 75    CBG (last 3)   Recent Labs  06/22/13 0050 06/22/13 0437 06/22/13 0810  GLUCAP 140* 91 91    Scheduled Meds: . aspirin  325 mg Oral Daily  . benazepril  10 mg Oral Daily  . enoxaparin (LOVENOX) injection  40 mg Subcutaneous Q24H  . insulin aspart  0-15 Units Subcutaneous Q4H  . nicotine  21 mg Transdermal Daily  . pantoprazole (PROTONIX) IV  40 mg Intravenous Q12H    Continuous Infusions: . sodium chloride 100 mL/hr at 06/21/13 2233    Past Medical History  Diagnosis Date  . Diabetes mellitus   . Staph infection     Past Surgical History  Procedure Laterality Date  . Cholecystectomy    . Abdominal hysterectomy  Joaquin Courts, RD, LDN, CNSC Pager [phone] After Hours Pager [phone]

## 2013-06-22 NOTE — Progress Notes (Signed)
VASCULAR SURGERY:  For Mesenteric arteriogram today. If she has a significant SMA stenosis, hopefully she is a candidate for PTA/Stent, as she does not have any good surgical options.   Waverly Ferrari, MD, FACS Beeper [phone] 06/22/2013

## 2013-06-23 DIAGNOSIS — I6529 Occlusion and stenosis of unspecified carotid artery: Secondary | ICD-10-CM

## 2013-06-23 LAB — GLUCOSE, CAPILLARY
Glucose-Capillary: 128 mg/dL — ABNORMAL HIGH (ref 70–99)
Glucose-Capillary: 208 mg/dL — ABNORMAL HIGH (ref 70–99)

## 2013-06-23 MED ORDER — INSULIN ASPART 100 UNIT/ML ~~LOC~~ SOLN
0.0000 [IU] | Freq: Three times a day (TID) | SUBCUTANEOUS | Status: DC
  Administered 2013-06-23: 8 [IU] via SUBCUTANEOUS
  Administered 2013-06-24: 5 [IU] via SUBCUTANEOUS
  Administered 2013-06-24: 3 [IU] via SUBCUTANEOUS
  Administered 2013-06-24: 2 [IU] via SUBCUTANEOUS
  Administered 2013-06-25: 3 [IU] via SUBCUTANEOUS
  Administered 2013-06-25: 2 [IU] via SUBCUTANEOUS

## 2013-06-23 MED ORDER — INSULIN ASPART 100 UNIT/ML ~~LOC~~ SOLN
0.0000 [IU] | Freq: Every day | SUBCUTANEOUS | Status: DC
  Administered 2013-06-23: 2 [IU] via SUBCUTANEOUS

## 2013-06-23 NOTE — Progress Notes (Addendum)
Cross cover for Dr. Elnoria Howard Subjective: Patient seems to be doing much better after SMA stenting. Actually, has noticed an improvement in her appetite after the stenting and did not have much abdominal pain after her meal.  Objective: Vital signs in last 24 hours: Temp:  [97.5 F (36.4 C)-98.1 F (36.7 C)] 98.1 F (36.7 C) (12/13 1444) Pulse Rate:  [76-83] 83 (12/13 1444) Resp:  [18] 18 (12/13 1444) BP: (111-156)/(54-74) 145/63 mmHg (12/13 1444) SpO2:  [99 %-100 %] 100 % (12/13 1444) Last BM Date: 06/18/13  Intake/Output from previous day:   Intake/Output this shift:    General appearance: alert, cooperative, appears stated age, no distress and pale Resp: clear to auscultation bilaterally Cardio: regular rate and rhythm, S1, S2 normal, no murmur, click, rub or gallop GI: soft, non-tender; bowel sounds normal; no masses,  no organomegaly Extremities: extremities normal, atraumatic, no cyanosis or edema  PT/INR  Recent Labs  06/21/13 1700  LABPROT 12.1  INR 0.91   Studies/Results: Ir Angiogram Visceral Selective  06/22/2013   CLINICAL DATA:  Diabetes, mesenteric vascular disease, chronic mesenteric ischemia, nausea, vomiting, postprandial angina  EXAM: Ultrasound guidance for vascular access  Selective SMA catheterization and angiogram including a flush aortogram  Proximal SMA intravascular stent insertion (7 mm x 39 mmPalmaz genesis  Date:  06/22/2013 12:15 PM12/06/2013 12:15 PM  Radiologist:  Judie Petit. Ruel Favors, MD  Guidance:  Ultrasound and fluoroscopic  MEDICATIONS AND MEDICAL HISTORY: 1 mg Versed, 75 mcg fentanyl, 3000 units heparin  ANESTHESIA/SEDATION: 30 min  CONTRAST:  50mL OMNIPAQUE IOHEXOL 300 MG/ML  SOLN  FLUOROSCOPY TIME:  10 min 6 seconds minutes  PROCEDURE: Informed consent was obtained from the patient following explanation of the procedure, risks, benefits and alternatives. The patient understands, agrees and consents for the procedure. All questions were addressed. A  time out was performed.  Maximal barrier sterile technique utilized including caps, mask, sterile gowns, sterile gloves, large sterile drape, hand hygiene, and ChloraPrep.  Previous imaging reviewed.  Under sterile conditions and local anesthesia, right common femoral artery micropuncture access performed with ultrasound. Guidewire advanced easily followed by a dilator set. Initially a 5 French sheath was inserted. Five French pigtail catheter was advanced into the abdominal aorta. Flush aortogram performed in the oblique and lateral projections.  Aortogram: Celiac origin is mildly narrow with plaque formation but no significant stenosis. Non ostial proximal SMA focal severe stenosis noted with poststenotic dilatation of the main SMA trunk distal to the stenosis. This correlates with the CT. IMA origin is also moderately stenotic however the IMA main trunk is hypertrophied and has extensive collateral communication into the SMA branches, indicative of chronic mesenteric ischemia.  In the lateral projection, the pigtail catheter was removed. A C2 catheter was utilized to select the SMA origin. Selective SMA angiogram performed. The non ostial proximal SMA focal severe stenosis is re- demonstrated. Measurements were obtained for the appropriate length and diameter. 035 Bentson guidewire was advanced distal to the stenosis. Repeat injection demonstrates occlusion of the SMA stenosis by the wire. 3000 units heparin instilled. Sheath exchanged for a 6 French hockey-stick delivery sheath. This was positioned at the SMA origin. Over the guidewire, a 7 mm x 39 mm stent was deployed centered on the non ostial proximal SMA stenosis. This was inflated to 10 atmospheres. During inflation the patient experienced moderate pain. The balloon was immediately deflated. The abdominal pain quickly resolved. Repeat injection demonstrates wide patency of the stented SMA. No significant residual stenosis. Minor residual narrowing  of the  SMA ostium, unchanged. There is brisk SMA flow.  Guidewire and sheath removed. Hemostasis obtained with a 6 [place] Exoseal device. No immediate complication. Patient tolerated the procedure well. Final ACT prior to sheath removal was 115.  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 Stenosis was occlusive by wire access. Following stent placement, no significant residual SMA stenosis with brisk flow demonstrated.   Electronically Signed   By: Ruel Favors M.D.   On: 06/22/2013 12:34   Ir US Guide Vasc Access Right  06/22/2013   CLINICAL DATA:  Diabetes, mesenteric vascular disease, chronic mesenteric ischemia, nausea, vomiting, postprandial angina  EXAM: Ultrasound guidance for vascular access  Selective SMA catheterization and angiogram including a flush aortogram  Proximal SMA intravascular stent insertion (7 mm x 39 mmPalmaz genesis  Date:  06/22/2013 12:15 PM12/06/2013 12:15 PM  Radiologist:  Judie Petit. Ruel Favors, MD  Guidance:  Ultrasound and fluoroscopic  MEDICATIONS AND MEDICAL HISTORY: 1 mg Versed, 75 mcg fentanyl, 3000 units heparin  ANESTHESIA/SEDATION: 30 min  CONTRAST:  50mL OMNIPAQUE IOHEXOL 300 MG/ML  SOLN  FLUOROSCOPY TIME:  10 min 6 seconds minutes  PROCEDURE: Informed consent was obtained from the patient following explanation of the procedure, risks, benefits and alternatives. The patient understands, agrees and consents for the procedure. All questions were addressed. A time out was performed.  Maximal barrier sterile technique utilized including caps, mask, sterile gowns, sterile gloves, large sterile drape, hand hygiene, and ChloraPrep.  Previous imaging reviewed.  Under sterile conditions and local anesthesia, right common femoral artery micropuncture access performed with ultrasound. Guidewire advanced easily followed by a dilator set. Initially a 5 French  sheath was inserted. Five French pigtail catheter was advanced into the abdominal aorta. Flush aortogram performed in the oblique and lateral projections.  Aortogram: Celiac origin is mildly narrow with plaque formation but no significant stenosis. Non ostial proximal SMA focal severe stenosis noted with poststenotic dilatation of the main SMA trunk distal to the stenosis. This correlates with the CT. IMA origin is also moderately stenotic however the IMA main trunk is hypertrophied and has extensive collateral communication into the SMA branches, indicative of chronic mesenteric ischemia.  In the lateral projection, the pigtail catheter was removed. A C2 catheter was utilized to select the SMA origin. Selective SMA angiogram performed. The non ostial proximal SMA focal severe stenosis is re- demonstrated. Measurements were obtained for the appropriate length and diameter. 035 Bentson guidewire was advanced distal to the stenosis. Repeat injection demonstrates occlusion of the SMA stenosis by the wire. 3000 units heparin instilled. Sheath exchanged for a 6 French hockey-stick delivery sheath. This was positioned at the SMA origin. Over the guidewire, a 7 mm x 39 mm stent was deployed centered on the non ostial proximal SMA stenosis. This was inflated to 10 atmospheres. During inflation the patient experienced moderate pain. The balloon was immediately deflated. The abdominal pain quickly resolved. Repeat injection demonstrates wide patency of the stented SMA. No significant residual stenosis. Minor residual narrowing of the SMA ostium, unchanged. There is brisk SMA flow.  Guidewire and sheath removed. Hemostasis obtained with a 6 [place] Exoseal device. No immediate complication. Patient tolerated the procedure well. Final ACT prior to sheath removal was 115.  COMPLICATIONS: No immediate  IMPRESSION: Successful balloon expandable stent insertion to treat a critical non ostial stenosis of the proximal SMA, estimated  at greater than  90%. Stenosis was occlusive by wire access. Following stent placement, no significant residual SMA stenosis with brisk flow demonstrated.   Electronically Signed   By: Ruel Favors M.D.   On: 06/22/2013 12:34   Ir [employer] Stent 1st Art Not Lillette Boxer Car Vert Car  06/22/2013   CLINICAL DATA:  Diabetes, mesenteric vascular disease, chronic mesenteric ischemia, nausea, vomiting, postprandial angina  EXAM: Ultrasound guidance for vascular access  Selective SMA catheterization and angiogram including a flush aortogram  Proximal SMA intravascular stent insertion (7 mm x 39 mmPalmaz genesis  Date:  06/22/2013 12:15 PM12/06/2013 12:15 PM  Radiologist:  Judie Petit. Ruel Favors, MD  Guidance:  Ultrasound and fluoroscopic  MEDICATIONS AND MEDICAL HISTORY: 1 mg Versed, 75 mcg fentanyl, 3000 units heparin  ANESTHESIA/SEDATION: 30 min  CONTRAST:  50mL OMNIPAQUE IOHEXOL 300 MG/ML  SOLN  FLUOROSCOPY TIME:  10 min 6 seconds minutes  PROCEDURE: Informed consent was obtained from the patient following explanation of the procedure, risks, benefits and alternatives. The patient understands, agrees and consents for the procedure. All questions were addressed. A time out was performed.  Maximal barrier sterile technique utilized including caps, mask, sterile gowns, sterile gloves, large sterile drape, hand hygiene, and ChloraPrep.  Previous imaging reviewed.  Under sterile conditions and local anesthesia, right common femoral artery micropuncture access performed with ultrasound. Guidewire advanced easily followed by a dilator set. Initially a 5 French sheath was inserted. Five French pigtail catheter was advanced into the abdominal aorta. Flush aortogram performed in the oblique and lateral projections.  Aortogram: Celiac origin is mildly narrow with plaque formation but no significant stenosis. Non ostial proximal SMA focal severe stenosis noted with poststenotic dilatation of the main SMA trunk distal to the stenosis.  This correlates with the CT. IMA origin is also moderately stenotic however the IMA main trunk is hypertrophied and has extensive collateral communication into the SMA branches, indicative of chronic mesenteric ischemia.  In the lateral projection, the pigtail catheter was removed. A C2 catheter was utilized to select the SMA origin. Selective SMA angiogram performed. The non ostial proximal SMA focal severe stenosis is re- demonstrated. Measurements were obtained for the appropriate length and diameter. 035 Bentson guidewire was advanced distal to the stenosis. Repeat injection demonstrates occlusion of the SMA stenosis by the wire. 3000 units heparin instilled. Sheath exchanged for a 6 French hockey-stick delivery sheath. This was positioned at the SMA origin. Over the guidewire, a 7 mm x 39 mm stent was deployed centered on the non ostial proximal SMA stenosis. This was inflated to 10 atmospheres. During inflation the patient experienced moderate pain. The balloon was immediately deflated. The abdominal pain quickly resolved. Repeat injection demonstrates wide patency of the stented SMA. No significant residual stenosis. Minor residual narrowing of the SMA ostium, unchanged. There is brisk SMA flow.  Guidewire and sheath removed. Hemostasis obtained with a 6 [place] Exoseal device. No immediate complication. Patient tolerated the procedure well. Final ACT prior to sheath removal was 115.  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 Stenosis was occlusive by wire access. Following stent placement, no significant residual SMA stenosis with brisk flow demonstrated.   Electronically Signed   By: Ruel Favors M.D.   On: 06/22/2013 12:34   Medications: I have reviewed the patient's current medications.  Assessment/Plan: 1) Chronic mesenteric ischemia: seems to have improved after  SMA stenting. Continue present  care. 2) Gastroparesis. Nausea and vomiting has resolved.  LOS: 4 days   Jobe Mutch 06/23/2013, 8:04 PM

## 2013-06-23 NOTE — Progress Notes (Signed)
Subjective: The patient states she is doing much better today, she denies any right groin pain or swelling. She states her abdominal pain is now an "ache" across her lower abd region she rates 4/10 compared to severe 10/10 pain. She states she is hungry today which is a positive change and tolerated liquids and crackers last evening without N/V. She has (+) flatus with no BM yet today. She denies any fever or chills.   Objective: Physical Exam: BP 156/74  Pulse 76  Temp(Src) 97.5 F (36.4 C) (Oral)  Resp 18  Ht 5\' 2"  (1.575 m)  Wt 135 lb 12.9 oz (61.6 kg)  BMI 24.83 kg/m2  SpO2 99%  General: A&Ox3, NAD, sitting up in bed. Abd: Soft, NT, ND, (+) BS Ext: Right femoral groin site dressing C/D/I, no signs of bleeding, hematoma, or infection, DP intact bilaterally.    Labs: CBC No results found for this basename: WBC, HGB, HCT, PLT,  in the last 72 hours BMET No results found for this basename: NA, K, CL, CO2, GLUCOSE, BUN, CREATININE, CALCIUM,  in the last 72 hours LFT No results found for this basename: PROT, ALBUMIN, AST, ALT, ALKPHOS, BILITOT, BILIDIR, IBILI, LIPASE,  in the last 72 hours PT/INR  Recent Labs  06/21/13 1700  LABPROT 12.1  INR 0.91     Studies/Results: Ir Angiogram Visceral Selective  06/22/2013   CLINICAL DATA:  Diabetes, mesenteric vascular disease, chronic mesenteric ischemia, nausea, vomiting, postprandial angina  EXAM: Ultrasound guidance for vascular access  Selective SMA catheterization and angiogram including a flush aortogram  Proximal SMA intravascular stent insertion (7 mm x 39 mmPalmaz genesis  Date:  06/22/2013 12:15 PM12/06/2013 12:15 PM  Radiologist:  Judie Petit. Ruel Favors, MD  Guidance:  Ultrasound and fluoroscopic  MEDICATIONS AND MEDICAL HISTORY: 1 mg Versed, 75 mcg fentanyl, 3000 units heparin  ANESTHESIA/SEDATION: 30 min  CONTRAST:  50mL OMNIPAQUE IOHEXOL 300 MG/ML  SOLN  FLUOROSCOPY TIME:  10 min 6 seconds minutes  PROCEDURE: Informed consent was  obtained from the patient following explanation of the procedure, risks, benefits and alternatives. The patient understands, agrees and consents for the procedure. All questions were addressed. A time out was performed.  Maximal barrier sterile technique utilized including caps, mask, sterile gowns, sterile gloves, large sterile drape, hand hygiene, and ChloraPrep.  Previous imaging reviewed.  Under sterile conditions and local anesthesia, right common femoral artery micropuncture access performed with ultrasound. Guidewire advanced easily followed by a dilator set. Initially a 5 French sheath was inserted. Five French pigtail catheter was advanced into the abdominal aorta. Flush aortogram performed in the oblique and lateral projections.  Aortogram: Celiac origin is mildly narrow with plaque formation but no significant stenosis. Non ostial proximal SMA focal severe stenosis noted with poststenotic dilatation of the main SMA trunk distal to the stenosis. This correlates with the CT. IMA origin is also moderately stenotic however the IMA main trunk is hypertrophied and has extensive collateral communication into the SMA branches, indicative of chronic mesenteric ischemia.  In the lateral projection, the pigtail catheter was removed. A C2 catheter was utilized to select the SMA origin. Selective SMA angiogram performed. The non ostial proximal SMA focal severe stenosis is re- demonstrated. Measurements were obtained for the appropriate length and diameter. 035 Bentson guidewire was advanced distal to the stenosis. Repeat injection demonstrates occlusion of the SMA stenosis by the wire. 3000 units heparin instilled. Sheath exchanged for a 6 French hockey-stick delivery sheath. This was positioned at the SMA origin. Over  the guidewire, a 7 mm x 39 mm stent was deployed centered on the non ostial proximal SMA stenosis. This was inflated to 10 atmospheres. During inflation the patient experienced moderate pain. The  balloon was immediately deflated. The abdominal pain quickly resolved. Repeat injection demonstrates wide patency of the stented SMA. No significant residual stenosis. Minor residual narrowing of the SMA ostium, unchanged. There is brisk SMA flow.  Guidewire and sheath removed. Hemostasis obtained with a 6 [place] Exoseal device. No immediate complication. Patient tolerated the procedure well. Final ACT prior to sheath removal was 115.  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 Stenosis was occlusive by wire access. Following stent placement, no significant residual SMA stenosis with brisk flow demonstrated.   Electronically Signed   By: Ruel Favors M.D.   On: 06/22/2013 12:34   Ir US Guide Vasc Access Right  06/22/2013   CLINICAL DATA:  Diabetes, mesenteric vascular disease, chronic mesenteric ischemia, nausea, vomiting, postprandial angina  EXAM: Ultrasound guidance for vascular access  Selective SMA catheterization and angiogram including a flush aortogram  Proximal SMA intravascular stent insertion (7 mm x 39 mmPalmaz genesis  Date:  06/22/2013 12:15 PM12/06/2013 12:15 PM  Radiologist:  Judie Petit. Ruel Favors, MD  Guidance:  Ultrasound and fluoroscopic  MEDICATIONS AND MEDICAL HISTORY: 1 mg Versed, 75 mcg fentanyl, 3000 units heparin  ANESTHESIA/SEDATION: 30 min  CONTRAST:  50mL OMNIPAQUE IOHEXOL 300 MG/ML  SOLN  FLUOROSCOPY TIME:  10 min 6 seconds minutes  PROCEDURE: Informed consent was obtained from the patient following explanation of the procedure, risks, benefits and alternatives. The patient understands, agrees and consents for the procedure. All questions were addressed. A time out was performed.  Maximal barrier sterile technique utilized including caps, mask, sterile gowns, sterile gloves, large sterile drape, hand hygiene, and ChloraPrep.  Previous imaging reviewed.   Under sterile conditions and local anesthesia, right common femoral artery micropuncture access performed with ultrasound. Guidewire advanced easily followed by a dilator set. Initially a 5 French sheath was inserted. Five French pigtail catheter was advanced into the abdominal aorta. Flush aortogram performed in the oblique and lateral projections.  Aortogram: Celiac origin is mildly narrow with plaque formation but no significant stenosis. Non ostial proximal SMA focal severe stenosis noted with poststenotic dilatation of the main SMA trunk distal to the stenosis. This correlates with the CT. IMA origin is also moderately stenotic however the IMA main trunk is hypertrophied and has extensive collateral communication into the SMA branches, indicative of chronic mesenteric ischemia.  In the lateral projection, the pigtail catheter was removed. A C2 catheter was utilized to select the SMA origin. Selective SMA angiogram performed. The non ostial proximal SMA focal severe stenosis is re- demonstrated. Measurements were obtained for the appropriate length and diameter. 035 Bentson guidewire was advanced distal to the stenosis. Repeat injection demonstrates occlusion of the SMA stenosis by the wire. 3000 units heparin instilled. Sheath exchanged for a 6 French hockey-stick delivery sheath. This was positioned at the SMA origin. Over the guidewire, a 7 mm x 39 mm stent was deployed centered on the non ostial proximal SMA stenosis. This was inflated to 10 atmospheres. During inflation the patient experienced moderate pain. The balloon was immediately deflated. The abdominal pain quickly resolved. Repeat injection demonstrates wide patency of the stented SMA. No significant residual stenosis. Minor residual narrowing of the SMA ostium, unchanged. There is brisk  SMA flow.  Guidewire and sheath removed. Hemostasis obtained with a 6 [place] Exoseal device. No immediate complication. Patient tolerated the procedure well. Final  ACT prior to sheath removal was 115.  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 Stenosis was occlusive by wire access. Following stent placement, no significant residual SMA stenosis with brisk flow demonstrated.   Electronically Signed   By: Ruel Favors M.D.   On: 06/22/2013 12:34   Ir [employer] Stent 1st Art Not Lillette Boxer Car Vert Car  06/22/2013   CLINICAL DATA:  Diabetes, mesenteric vascular disease, chronic mesenteric ischemia, nausea, vomiting, postprandial angina  EXAM: Ultrasound guidance for vascular access  Selective SMA catheterization and angiogram including a flush aortogram  Proximal SMA intravascular stent insertion (7 mm x 39 mmPalmaz genesis  Date:  06/22/2013 12:15 PM12/06/2013 12:15 PM  Radiologist:  Judie Petit. Ruel Favors, MD  Guidance:  Ultrasound and fluoroscopic  MEDICATIONS AND MEDICAL HISTORY: 1 mg Versed, 75 mcg fentanyl, 3000 units heparin  ANESTHESIA/SEDATION: 30 min  CONTRAST:  50mL OMNIPAQUE IOHEXOL 300 MG/ML  SOLN  FLUOROSCOPY TIME:  10 min 6 seconds minutes  PROCEDURE: Informed consent was obtained from the patient following explanation of the procedure, risks, benefits and alternatives. The patient understands, agrees and consents for the procedure. All questions were addressed. A time out was performed.  Maximal barrier sterile technique utilized including caps, mask, sterile gowns, sterile gloves, large sterile drape, hand hygiene, and ChloraPrep.  Previous imaging reviewed.  Under sterile conditions and local anesthesia, right common femoral artery micropuncture access performed with ultrasound. Guidewire advanced easily followed by a dilator set. Initially a 5 French sheath was inserted. Five French pigtail catheter was advanced into the abdominal aorta. Flush aortogram performed in the oblique and lateral projections.  Aortogram: Celiac origin  is mildly narrow with plaque formation but no significant stenosis. Non ostial proximal SMA focal severe stenosis noted with poststenotic dilatation of the main SMA trunk distal to the stenosis. This correlates with the CT. IMA origin is also moderately stenotic however the IMA main trunk is hypertrophied and has extensive collateral communication into the SMA branches, indicative of chronic mesenteric ischemia.  In the lateral projection, the pigtail catheter was removed. A C2 catheter was utilized to select the SMA origin. Selective SMA angiogram performed. The non ostial proximal SMA focal severe stenosis is re- demonstrated. Measurements were obtained for the appropriate length and diameter. 035 Bentson guidewire was advanced distal to the stenosis. Repeat injection demonstrates occlusion of the SMA stenosis by the wire. 3000 units heparin instilled. Sheath exchanged for a 6 French hockey-stick delivery sheath. This was positioned at the SMA origin. Over the guidewire, a 7 mm x 39 mm stent was deployed centered on the non ostial proximal SMA stenosis. This was inflated to 10 atmospheres. During inflation the patient experienced moderate pain. The balloon was immediately deflated. The abdominal pain quickly resolved. Repeat injection demonstrates wide patency of the stented SMA. No significant residual stenosis. Minor residual narrowing of the SMA ostium, unchanged. There is brisk SMA flow.  Guidewire and sheath removed. Hemostasis obtained with a 6 [place] Exoseal device. No immediate complication. Patient tolerated the procedure well. Final ACT prior to sheath removal was 115.  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 Stenosis was  occlusive by wire access. Following stent placement, no significant residual SMA stenosis with brisk flow demonstrated.   Electronically Signed    By: Ruel Favors M.D.   On: 06/22/2013 12:34    Assessment/Plan: Abdominal pain, improving.  Weight Loss.  Mesenteric ischemia s/p SMA stent plavix Rx given for 3 months and patient instructed to take aspirin daily with this medication. Education on importance of these medications with restenosis rates discussed as well as any signs of bleeding. The patient was instructed to contact our office asap or present to ED is severe bleeding, bruising occurs.  History of CAD s/p stents  Dyslipidemia.  DM. Possible R ICA stenosis >80% on duplex, vascular following.     LOS: 4 days    Berneta Levins PA-C 06/23/2013 11:04 AM

## 2013-06-23 NOTE — Progress Notes (Signed)
[facility] PROGRESS NOTE  Rayan Dyal FAO:[id number] DOB: 1951/01/22 DOA: 06/19/2013 PCP: Doris Cheadle, MD  Assessment/Plan: 1. Intractable nausea/vomiting. Patient presenting with multiple episodes of nausea and vomiting, unable to keep by mouth down, becoming dehydrated. Appreciate GI and Vasc Surgery input. Pt is s/p IR guided angiography 06/22/13 with stent placement. Reported feeling better. 2. Dehydration. Patient clinically dehydrated, presenting with hypotension that responded to IV fluid resuscitation. 3. Hyponatremia. Patient's lab work showing a sodium of 131 which could be secondary to hypotonic hypovolemic hyponatremia. Resolved 4. History of hypertension. Hypotension resolved. Resumed ACEI. 5. Type 2 diabetes mellitus. Will discontinue hypoglycemics as patient has not taken in by mouth, place her on Accu-Cheks every 4 hours with sliding scale coverage. 6. ICA stenosis 1. 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 Asymptomatic 3. ?i if ntervention recommended - Vasc surgery recommended f/u CTA neck 7. Nutrition. N.p.o. 8. DVT prophylaxis. Lovenox  Code Status: Full Family Communication: Pt in room (indicate person spoken with, relationship, and if by phone, the number) Disposition Plan: Pending  Consultants:  GI  Vascular surgery  IR  Procedures - Mesenteric angiography with stent placement 06/22/13 by IR - B carotid dopplers - >80% RICA stenosis  HPI/Subjective: No acute events noted overnight  Objective: Filed Vitals:   06/22/13 1418 06/22/13 1458 06/22/13 2013 06/23/13 0406  BP: 187/76 160/69 111/54 156/74  Pulse:  84 83 76  Temp:   98.1 F (36.7 C) 97.5 F (36.4 C)  TempSrc:   Oral Oral  Resp:   18 18  Height:      Weight:      SpO2:   100% 99%   No intake or output data in the 24 hours ending 06/23/13 0800 Filed Weights   06/22/13 0700  Weight: 61.6 kg (135 lb 12.9 oz)    Exam:   General:  Awake, in  nad  Cardiovascular: regular, s1, s2  Respiratory: normal resp effort, no wheezing  Abdomen: soft, pos BS  Musculoskeletal: perfused, no clubbing   Data Reviewed: Basic Metabolic Panel:  Recent Labs Lab 06/19/13 1412 06/20/13 0435  NA 131* 137  K 4.6 4.0  CL 97 103  CO2 22 20  GLUCOSE 164* 75  BUN 18 13  CREATININE 0.50 0.34*  CALCIUM 9.3 8.7  MG 1.7  --   PHOS 3.5  --    Liver Function Tests:  Recent Labs Lab 06/20/13 0435  AST 14  ALT 6  ALKPHOS 92  BILITOT 0.5  PROT 6.0  ALBUMIN 3.1*   No results found for this basename: LIPASE, AMYLASE,  in the last 168 hours No results found for this basename: AMMONIA,  in the last 168 hours CBC:  Recent Labs Lab 06/19/13 1412 06/20/13 0435  WBC 16.5* 10.9*  NEUTROABS 11.9*  --   HGB 14.5 12.9  HCT 40.6 37.6  MCV 92.3 93.8  PLT 293 273   Cardiac Enzymes: No results found for this basename: CKTOTAL, CKMB, CKMBINDEX, TROPONINI,  in the last 168 hours BNP (last 3 results) No results found for this basename: PROBNP,  in the last 8760 hours CBG:  Recent Labs Lab 06/22/13 0810 06/22/13 1616 06/22/13 2019 06/22/13 2344 06/23/13 0406  GLUCAP 91 92 270* 106* 89    Recent Results (from the past 240 hour(s))  URINE CULTURE     Status: None   Collection Time    06/14/13  6:07 PM      Result Value Range  Status   Specimen Description URINE, CLEAN CATCH   Final   Special Requests Normal   Final   Culture  Setup Time     Final   Value: 06/14/2013 21:18     Performed at [facility] Count     Final   Value: NO GROWTH     Performed at [facility]   Culture     Final   Value: NO GROWTH     Performed at [facility]   Report Status 06/15/2013 FINAL   Final  URINE CULTURE     Status: None   Collection Time    06/19/13  5:51 PM      Result Value Range Status   Specimen Description URINE, CLEAN CATCH   Final   Special Requests NONE   Final   Culture  Setup Time     Final    Value: 06/19/2013 22:26     Performed at [facility] Count     Final   Value: NO GROWTH     Performed at [facility]   Culture     Final   Value: NO GROWTH     Performed at [facility]   Report Status 06/20/2013 FINAL   Final  SURGICAL PCR SCREEN     Status: None   Collection Time    06/20/13 12:41 AM      Result Value Range Status   MRSA, PCR NEGATIVE  NEGATIVE Final   Staphylococcus aureus NEGATIVE  NEGATIVE Final   Comment:            The Xpert SA Assay (FDA     approved for NASAL specimens     in patients over 63 years of age),     is one component of     a comprehensive surveillance     program.  Test performance has     been validated by [employer] for patients greater     than or equal to 36 year old.     It is not intended     to diagnose infection nor to     guide or monitor treatment.     Studies: Ir Angiogram Visceral Selective  06/22/2013   CLINICAL DATA:  Diabetes, mesenteric vascular disease, chronic mesenteric ischemia, nausea, vomiting, postprandial angina  EXAM: Ultrasound guidance for vascular access  Selective SMA catheterization and angiogram including a flush aortogram  Proximal SMA intravascular stent insertion (7 mm x 39 mmPalmaz genesis  Date:  06/22/2013 12:15 PM12/06/2013 12:15 PM  Radiologist:  Judie Petit. Ruel Favors, MD  Guidance:  Ultrasound and fluoroscopic  MEDICATIONS AND MEDICAL HISTORY: 1 mg Versed, 75 mcg fentanyl, 3000 units heparin  ANESTHESIA/SEDATION: 30 min  CONTRAST:  50mL OMNIPAQUE IOHEXOL 300 MG/ML  SOLN  FLUOROSCOPY TIME:  10 min 6 seconds minutes  PROCEDURE: Informed consent was obtained from the patient following explanation of the procedure, risks, benefits and alternatives. The patient understands, agrees and consents for the procedure. All questions were addressed. A time out was performed.  Maximal barrier sterile technique utilized including caps, mask, sterile gowns, sterile gloves, large sterile  drape, hand hygiene, and ChloraPrep.  Previous imaging reviewed.  Under sterile conditions and local anesthesia, right common femoral artery micropuncture access performed with ultrasound. Guidewire advanced easily followed by a dilator set. Initially a 5 French sheath was inserted. Five French pigtail catheter was advanced into the abdominal aorta.  Flush aortogram performed in the oblique and lateral projections.  Aortogram: Celiac origin is mildly narrow with plaque formation but no significant stenosis. Non ostial proximal SMA focal severe stenosis noted with poststenotic dilatation of the main SMA trunk distal to the stenosis. This correlates with the CT. IMA origin is also moderately stenotic however the IMA main trunk is hypertrophied and has extensive collateral communication into the SMA branches, indicative of chronic mesenteric ischemia.  In the lateral projection, the pigtail catheter was removed. A C2 catheter was utilized to select the SMA origin. Selective SMA angiogram performed. The non ostial proximal SMA focal severe stenosis is re- demonstrated. Measurements were obtained for the appropriate length and diameter. 035 Bentson guidewire was advanced distal to the stenosis. Repeat injection demonstrates occlusion of the SMA stenosis by the wire. 3000 units heparin instilled. Sheath exchanged for a 6 French hockey-stick delivery sheath. This was positioned at the SMA origin. Over the guidewire, a 7 mm x 39 mm stent was deployed centered on the non ostial proximal SMA stenosis. This was inflated to 10 atmospheres. During inflation the patient experienced moderate pain. The balloon was immediately deflated. The abdominal pain quickly resolved. Repeat injection demonstrates wide patency of the stented SMA. No significant residual stenosis. Minor residual narrowing of the SMA ostium, unchanged. There is brisk SMA flow.  Guidewire and sheath removed. Hemostasis obtained with a 6 [place] Exoseal device. No  immediate complication. Patient tolerated the procedure well. Final ACT prior to sheath removal was 115.  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 Stenosis was occlusive by wire access. Following stent placement, no significant residual SMA stenosis with brisk flow demonstrated.   Electronically Signed   By: Ruel Favors M.D.   On: 06/22/2013 12:34   Ir US Guide Vasc Access Right  06/22/2013   CLINICAL DATA:  Diabetes, mesenteric vascular disease, chronic mesenteric ischemia, nausea, vomiting, postprandial angina  EXAM: Ultrasound guidance for vascular access  Selective SMA catheterization and angiogram including a flush aortogram  Proximal SMA intravascular stent insertion (7 mm x 39 mmPalmaz genesis  Date:  06/22/2013 12:15 PM12/06/2013 12:15 PM  Radiologist:  Judie Petit. Ruel Favors, MD  Guidance:  Ultrasound and fluoroscopic  MEDICATIONS AND MEDICAL HISTORY: 1 mg Versed, 75 mcg fentanyl, 3000 units heparin  ANESTHESIA/SEDATION: 30 min  CONTRAST:  50mL OMNIPAQUE IOHEXOL 300 MG/ML  SOLN  FLUOROSCOPY TIME:  10 min 6 seconds minutes  PROCEDURE: Informed consent was obtained from the patient following explanation of the procedure, risks, benefits and alternatives. The patient understands, agrees and consents for the procedure. All questions were addressed. A time out was performed.  Maximal barrier sterile technique utilized including caps, mask, sterile gowns, sterile gloves, large sterile drape, hand hygiene, and ChloraPrep.  Previous imaging reviewed.  Under sterile conditions and local anesthesia, right common femoral artery micropuncture access performed with ultrasound. Guidewire advanced easily followed by a dilator set. Initially a 5 French sheath was inserted. Five French pigtail catheter was advanced into the abdominal aorta. Flush aortogram performed in the oblique and  lateral projections.  Aortogram: Celiac origin is mildly narrow with plaque formation but no significant stenosis. Non ostial proximal SMA focal severe stenosis noted with poststenotic dilatation of the main SMA trunk distal to the stenosis. This correlates with the CT. IMA origin is also moderately stenotic however the IMA main trunk is hypertrophied and has extensive collateral communication  into the SMA branches, indicative of chronic mesenteric ischemia.  In the lateral projection, the pigtail catheter was removed. A C2 catheter was utilized to select the SMA origin. Selective SMA angiogram performed. The non ostial proximal SMA focal severe stenosis is re- demonstrated. Measurements were obtained for the appropriate length and diameter. 035 Bentson guidewire was advanced distal to the stenosis. Repeat injection demonstrates occlusion of the SMA stenosis by the wire. 3000 units heparin instilled. Sheath exchanged for a 6 French hockey-stick delivery sheath. This was positioned at the SMA origin. Over the guidewire, a 7 mm x 39 mm stent was deployed centered on the non ostial proximal SMA stenosis. This was inflated to 10 atmospheres. During inflation the patient experienced moderate pain. The balloon was immediately deflated. The abdominal pain quickly resolved. Repeat injection demonstrates wide patency of the stented SMA. No significant residual stenosis. Minor residual narrowing of the SMA ostium, unchanged. There is brisk SMA flow.  Guidewire and sheath removed. Hemostasis obtained with a 6 [place] Exoseal device. No immediate complication. Patient tolerated the procedure well. Final ACT prior to sheath removal was 115.  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 Stenosis was occlusive by wire access. Following stent placement, no significant residual SMA stenosis with brisk  flow demonstrated.   Electronically Signed   By: Ruel Favors M.D.   On: 06/22/2013 12:34   Ir [employer] Stent 1st Art Not Lillette Boxer Car Vert Car  06/22/2013   CLINICAL DATA:  Diabetes, mesenteric vascular disease, chronic mesenteric ischemia, nausea, vomiting, postprandial angina  EXAM: Ultrasound guidance for vascular access  Selective SMA catheterization and angiogram including a flush aortogram  Proximal SMA intravascular stent insertion (7 mm x 39 mmPalmaz genesis  Date:  06/22/2013 12:15 PM12/06/2013 12:15 PM  Radiologist:  Judie Petit. Ruel Favors, MD  Guidance:  Ultrasound and fluoroscopic  MEDICATIONS AND MEDICAL HISTORY: 1 mg Versed, 75 mcg fentanyl, 3000 units heparin  ANESTHESIA/SEDATION: 30 min  CONTRAST:  50mL OMNIPAQUE IOHEXOL 300 MG/ML  SOLN  FLUOROSCOPY TIME:  10 min 6 seconds minutes  PROCEDURE: Informed consent was obtained from the patient following explanation of the procedure, risks, benefits and alternatives. The patient understands, agrees and consents for the procedure. All questions were addressed. A time out was performed.  Maximal barrier sterile technique utilized including caps, mask, sterile gowns, sterile gloves, large sterile drape, hand hygiene, and ChloraPrep.  Previous imaging reviewed.  Under sterile conditions and local anesthesia, right common femoral artery micropuncture access performed with ultrasound. Guidewire advanced easily followed by a dilator set. Initially a 5 French sheath was inserted. Five French pigtail catheter was advanced into the abdominal aorta. Flush aortogram performed in the oblique and lateral projections.  Aortogram: Celiac origin is mildly narrow with plaque formation but no significant stenosis. Non ostial proximal SMA focal severe stenosis noted with poststenotic dilatation of the main SMA trunk distal to the stenosis. This correlates with the CT. IMA origin is also moderately stenotic however the IMA main trunk is hypertrophied and has extensive  collateral communication into the SMA branches, indicative of chronic mesenteric ischemia.  In the lateral projection, the pigtail catheter was removed. A C2 catheter was utilized to select the SMA origin. Selective SMA angiogram performed. The non ostial proximal SMA focal severe stenosis is re- demonstrated. Measurements were obtained for the appropriate length and diameter. 035 Bentson guidewire was advanced distal to  the stenosis. Repeat injection demonstrates occlusion of the SMA stenosis by the wire. 3000 units heparin instilled. Sheath exchanged for a 6 French hockey-stick delivery sheath. This was positioned at the SMA origin. Over the guidewire, a 7 mm x 39 mm stent was deployed centered on the non ostial proximal SMA stenosis. This was inflated to 10 atmospheres. During inflation the patient experienced moderate pain. The balloon was immediately deflated. The abdominal pain quickly resolved. Repeat injection demonstrates wide patency of the stented SMA. No significant residual stenosis. Minor residual narrowing of the SMA ostium, unchanged. There is brisk SMA flow.  Guidewire and sheath removed. Hemostasis obtained with a 6 [place] Exoseal device. No immediate complication. Patient tolerated the procedure well. Final ACT prior to sheath removal was 115.  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 Stenosis was occlusive by wire access. Following stent placement, no significant residual SMA stenosis with brisk flow demonstrated.   Electronically Signed   By: Ruel Favors M.D.   On: 06/22/2013 12:34    Scheduled Meds: . aspirin  325 mg Oral Daily  . benazepril  10 mg Oral Daily  . clopidogrel  75 mg Oral Q breakfast  . enoxaparin (LOVENOX) injection  40 mg Subcutaneous Q24H  . insulin aspart  0-15 Units Subcutaneous Q4H  . nicotine  21 mg Transdermal Daily  .  pantoprazole (PROTONIX) IV  40 mg Intravenous Q12H   Continuous Infusions: . sodium chloride 100 mL/hr at 06/21/13 2233    Active Problems:   DM (diabetes mellitus)   Essential hypertension, benign   Other and unspecified hyperlipidemia   History of UTI   Dehydration   Nausea & vomiting   Gastroparesis   Nausea and vomiting   Carotid bruit   Chronic mesenteric ischemia  Time spent:  CHIU, STEPHEN K  [facility] Pager [phone]. If 7PM-7AM, please contact night-coverage at [email or web address], password [facility] 06/23/2013, 8:00 AM  LOS: 4 days

## 2013-06-23 NOTE — Progress Notes (Addendum)
   Daily Progress Note  Assessment/Planning: POD #1 s/p SMA stenting, possible R ICA stenosis >80%   Sx improved after SMA stenting  Would verify carotid findings with a CTA Neck in a day or two to allow avoid contrast induced nepropathy given the recent contrast exposure for SMA stenting   Dr. Edilia Bo will be by on Monday  Subjective    Pain improved  Objective Filed Vitals:   06/22/13 1418 06/22/13 1458 06/22/13 2013 06/23/13 0406  BP: 187/76 160/69 111/54 156/74  Pulse:  84 83 76  Temp:   98.1 F (36.7 C) 97.5 F (36.4 C)  TempSrc:   Oral Oral  Resp:   18 18  Height:      Weight:      SpO2:   100% 99%   No intake or output data in the 24 hours ending 06/23/13 0853  GI soft, NTND  Laboratory CBC    Component Value Date/Time   WBC 10.9* 06/20/2013 0435   HGB 12.9 06/20/2013 0435   HCT 37.6 06/20/2013 0435   PLT 273 06/20/2013 0435    BMET    Component Value Date/Time   NA 137 06/20/2013 0435   K 4.0 06/20/2013 0435   CL 103 06/20/2013 0435   CO2 20 06/20/2013 0435   GLUCOSE 75 06/20/2013 0435   BUN 13 06/20/2013 0435   CREATININE 0.34* 06/20/2013 0435   CALCIUM 8.7 06/20/2013 0435   GFRNONAA >90 06/20/2013 0435   GFRAA >90 06/20/2013 0435    Leonides Sake, MD [facility] Office: [phone] Pager: [phone]  06/23/2013, 8:53 AM

## 2013-06-24 ENCOUNTER — Inpatient Hospital Stay (HOSPITAL_COMMUNITY): Payer: Medicaid Other

## 2013-06-24 DIAGNOSIS — K551 Chronic vascular disorders of intestine: Principal | ICD-10-CM

## 2013-06-24 LAB — BASIC METABOLIC PANEL
BUN: 6 mg/dL (ref 6–23)
CO2: 26 mEq/L (ref 19–32)
Calcium: 8.5 mg/dL (ref 8.4–10.5)
Chloride: 101 mEq/L (ref 96–112)
Creatinine, Ser: 0.45 mg/dL — ABNORMAL LOW (ref 0.50–1.10)
GFR calc Af Amer: >90 mL/min (ref >90)
GFR calc non Af Amer: >90 mL/min (ref >90)

## 2013-06-24 LAB — GLUCOSE, CAPILLARY
Glucose-Capillary: 215 mg/dL — ABNORMAL HIGH (ref 70–99)
Glucose-Capillary: 138 mg/dL — ABNORMAL HIGH (ref 70–99)

## 2013-06-24 MED ORDER — IOHEXOL 350 MG/ML SOLN
50.0000 mL | Freq: Once | INTRAVENOUS | Status: AC | PRN
  Administered 2013-06-24: 50 mL via INTRAVENOUS

## 2013-06-24 MED ORDER — PANTOPRAZOLE SODIUM 40 MG PO TBEC
40.0000 mg | DELAYED_RELEASE_TABLET | Freq: Two times a day (BID) | ORAL | Status: DC
  Administered 2013-06-24 – 2013-06-25 (×2): 40 mg via ORAL
  Filled 2013-06-24 (×2): qty 1

## 2013-06-24 NOTE — Progress Notes (Signed)
[facility] PROGRESS NOTE  Sarah Alvarez NWG:[id number] DOB: 04-02-1951 DOA: 06/19/2013 PCP: Doris Cheadle, MD  Assessment/Plan: 1. Intractable nausea/vomiting. Patient presenting with multiple episodes of nausea and vomiting, unable to keep by mouth down, becoming dehydrated. Appreciate GI and Vasc Surgery input. Pt is s/p IR guided angiography 06/22/13 with stent placement. Reported feeling better. 2. Dehydration. Patient clinically dehydrated, presenting with hypotension that responded to IV fluid resuscitation. 3. Hyponatremia. Patient's lab work showing a sodium of 131 which could be secondary to hypotonic hypovolemic hyponatremia. Resolved 4. History of hypertension. Hypotension resolved. Resumed ACEI. 5. Type 2 diabetes mellitus. Will discontinue hypoglycemics as patient has not taken in by mouth, place her on Accu-Cheks every 4 hours with sliding scale coverage. 6. ICA stenosis 1. 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 Asymptomatic 3. ?i if ntervention recommended - Vasc surgery recommended f/u CTA neck. Will order if renal fx this AM is appropriate 7. Nutrition. N.p.o. 8. DVT prophylaxis. Lovenox  Code Status: Full Family Communication: Pt in room (indicate person spoken with, relationship, and if by phone, the number) Disposition Plan: Pending  Consultants:  GI  Vascular surgery  IR  Procedures - Mesenteric angiography with stent placement 06/22/13 by IR - B carotid dopplers - >80% RICA stenosis  HPI/Subjective: No acute events noted overnight. Reports feeling better.  Objective: Filed Vitals:   06/23/13 0406 06/23/13 1444 06/23/13 2300 06/24/13 0558  BP: 156/74 145/63 142/79 151/65  Pulse: 76 83 84 70  Temp: 97.5 F (36.4 C) 98.1 F (36.7 C) 98.2 F (36.8 C) 98.6 F (37 C)  TempSrc: Oral Oral    Resp: 18 18 18 17   Height:      Weight:      SpO2: 99% 100% 99% 100%    Intake/Output Summary (Last 24 hours) at  06/24/13 0819 Last data filed at 06/23/13 2300  Gross per 24 hour  Intake   2385 ml  Output      0 ml  Net   2385 ml   Filed Weights   06/22/13 0700  Weight: 61.6 kg (135 lb 12.9 oz)    Exam:   General:  Awake, in nad  Cardiovascular: regular, s1, s2  Respiratory: normal resp effort, no wheezing  Abdomen: soft, pos BS  Musculoskeletal: perfused, no clubbing   Data Reviewed: Basic Metabolic Panel:  Recent Labs Lab 06/19/13 1412 06/20/13 0435  NA 131* 137  K 4.6 4.0  CL 97 103  CO2 22 20  GLUCOSE 164* 75  BUN 18 13  CREATININE 0.50 0.34*  CALCIUM 9.3 8.7  MG 1.7  --   PHOS 3.5  --    Liver Function Tests:  Recent Labs Lab 06/20/13 0435  AST 14  ALT 6  ALKPHOS 92  BILITOT 0.5  PROT 6.0  ALBUMIN 3.1*   No results found for this basename: LIPASE, AMYLASE,  in the last 168 hours No results found for this basename: AMMONIA,  in the last 168 hours CBC:  Recent Labs Lab 06/19/13 1412 06/20/13 0435  WBC 16.5* 10.9*  NEUTROABS 11.9*  --   HGB 14.5 12.9  HCT 40.6 37.6  MCV 92.3 93.8  PLT 293 273   Cardiac Enzymes: No results found for this basename: CKTOTAL, CKMB, CKMBINDEX, TROPONINI,  in the last 168 hours BNP (last 3 results) No results found for this basename: PROBNP,  in the last 8760 hours CBG:  Recent Labs Lab 06/23/13 0808 06/23/13 1152 06/23/13 1615  06/23/13 2227 06/24/13 0629  GLUCAP 128* 225* 257* 208* 158*    Recent Results (from the past 240 hour(s))  URINE CULTURE     Status: None   Collection Time    06/14/13  6:07 PM      Result Value Range Status   Specimen Description URINE, CLEAN CATCH   Final   Special Requests Normal   Final   Culture  Setup Time     Final   Value: 06/14/2013 21:18     Performed at [facility] Count     Final   Value: NO GROWTH     Performed at [facility]   Culture     Final   Value: NO GROWTH     Performed at [facility]   Report Status 06/15/2013 FINAL    Final  URINE CULTURE     Status: None   Collection Time    06/19/13  5:51 PM      Result Value Range Status   Specimen Description URINE, CLEAN CATCH   Final   Special Requests NONE   Final   Culture  Setup Time     Final   Value: 06/19/2013 22:26     Performed at [facility] Count     Final   Value: NO GROWTH     Performed at [facility]   Culture     Final   Value: NO GROWTH     Performed at [facility]   Report Status 06/20/2013 FINAL   Final  SURGICAL PCR SCREEN     Status: None   Collection Time    06/20/13 12:41 AM      Result Value Range Status   MRSA, PCR NEGATIVE  NEGATIVE Final   Staphylococcus aureus NEGATIVE  NEGATIVE Final   Comment:            The Xpert SA Assay (FDA     approved for NASAL specimens     in patients over 27 years of age),     is one component of     a comprehensive surveillance     program.  Test performance has     been validated by [employer] for patients greater     than or equal to 59 year old.     It is not intended     to diagnose infection nor to     guide or monitor treatment.     Studies: Ir Angiogram Visceral Selective  06/22/2013   CLINICAL DATA:  Diabetes, mesenteric vascular disease, chronic mesenteric ischemia, nausea, vomiting, postprandial angina  EXAM: Ultrasound guidance for vascular access  Selective SMA catheterization and angiogram including a flush aortogram  Proximal SMA intravascular stent insertion (7 mm x 39 mmPalmaz genesis  Date:  06/22/2013 12:15 PM12/06/2013 12:15 PM  Radiologist:  Judie Petit. Ruel Favors, MD  Guidance:  Ultrasound and fluoroscopic  MEDICATIONS AND MEDICAL HISTORY: 1 mg Versed, 75 mcg fentanyl, 3000 units heparin  ANESTHESIA/SEDATION: 30 min  CONTRAST:  50mL OMNIPAQUE IOHEXOL 300 MG/ML  SOLN  FLUOROSCOPY TIME:  10 min 6 seconds minutes  PROCEDURE: Informed consent was obtained from the patient following explanation of the procedure, risks, benefits and alternatives.  The patient understands, agrees and consents for the procedure. All questions were addressed. A time out was performed.  Maximal barrier sterile technique utilized including caps, mask, sterile gowns, sterile gloves, large sterile drape,  hand hygiene, and ChloraPrep.  Previous imaging reviewed.  Under sterile conditions and local anesthesia, right common femoral artery micropuncture access performed with ultrasound. Guidewire advanced easily followed by a dilator set. Initially a 5 French sheath was inserted. Five French pigtail catheter was advanced into the abdominal aorta. Flush aortogram performed in the oblique and lateral projections.  Aortogram: Celiac origin is mildly narrow with plaque formation but no significant stenosis. Non ostial proximal SMA focal severe stenosis noted with poststenotic dilatation of the main SMA trunk distal to the stenosis. This correlates with the CT. IMA origin is also moderately stenotic however the IMA main trunk is hypertrophied and has extensive collateral communication into the SMA branches, indicative of chronic mesenteric ischemia.  In the lateral projection, the pigtail catheter was removed. A C2 catheter was utilized to select the SMA origin. Selective SMA angiogram performed. The non ostial proximal SMA focal severe stenosis is re- demonstrated. Measurements were obtained for the appropriate length and diameter. 035 Bentson guidewire was advanced distal to the stenosis. Repeat injection demonstrates occlusion of the SMA stenosis by the wire. 3000 units heparin instilled. Sheath exchanged for a 6 French hockey-stick delivery sheath. This was positioned at the SMA origin. Over the guidewire, a 7 mm x 39 mm stent was deployed centered on the non ostial proximal SMA stenosis. This was inflated to 10 atmospheres. During inflation the patient experienced moderate pain. The balloon was immediately deflated. The abdominal pain quickly resolved. Repeat injection demonstrates wide  patency of the stented SMA. No significant residual stenosis. Minor residual narrowing of the SMA ostium, unchanged. There is brisk SMA flow.  Guidewire and sheath removed. Hemostasis obtained with a 6 [place] Exoseal device. No immediate complication. Patient tolerated the procedure well. Final ACT prior to sheath removal was 115.  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 Stenosis was occlusive by wire access. Following stent placement, no significant residual SMA stenosis with brisk flow demonstrated.   Electronically Signed   By: Ruel Favors M.D.   On: 06/22/2013 12:34   Ir US Guide Vasc Access Right  06/22/2013   CLINICAL DATA:  Diabetes, mesenteric vascular disease, chronic mesenteric ischemia, nausea, vomiting, postprandial angina  EXAM: Ultrasound guidance for vascular access  Selective SMA catheterization and angiogram including a flush aortogram  Proximal SMA intravascular stent insertion (7 mm x 39 mmPalmaz genesis  Date:  06/22/2013 12:15 PM12/06/2013 12:15 PM  Radiologist:  Judie Petit. Ruel Favors, MD  Guidance:  Ultrasound and fluoroscopic  MEDICATIONS AND MEDICAL HISTORY: 1 mg Versed, 75 mcg fentanyl, 3000 units heparin  ANESTHESIA/SEDATION: 30 min  CONTRAST:  50mL OMNIPAQUE IOHEXOL 300 MG/ML  SOLN  FLUOROSCOPY TIME:  10 min 6 seconds minutes  PROCEDURE: Informed consent was obtained from the patient following explanation of the procedure, risks, benefits and alternatives. The patient understands, agrees and consents for the procedure. All questions were addressed. A time out was performed.  Maximal barrier sterile technique utilized including caps, mask, sterile gowns, sterile gloves, large sterile drape, hand hygiene, and ChloraPrep.  Previous imaging reviewed.  Under sterile conditions and local anesthesia, right common femoral artery micropuncture access performed  with ultrasound. Guidewire advanced easily followed by a dilator set. Initially a 5 French sheath was inserted. Five French pigtail catheter was advanced into the abdominal aorta. Flush aortogram performed in the oblique and lateral projections.  Aortogram: Celiac origin is mildly narrow with  plaque formation but no significant stenosis. Non ostial proximal SMA focal severe stenosis noted with poststenotic dilatation of the main SMA trunk distal to the stenosis. This correlates with the CT. IMA origin is also moderately stenotic however the IMA main trunk is hypertrophied and has extensive collateral communication into the SMA branches, indicative of chronic mesenteric ischemia.  In the lateral projection, the pigtail catheter was removed. A C2 catheter was utilized to select the SMA origin. Selective SMA angiogram performed. The non ostial proximal SMA focal severe stenosis is re- demonstrated. Measurements were obtained for the appropriate length and diameter. 035 Bentson guidewire was advanced distal to the stenosis. Repeat injection demonstrates occlusion of the SMA stenosis by the wire. 3000 units heparin instilled. Sheath exchanged for a 6 French hockey-stick delivery sheath. This was positioned at the SMA origin. Over the guidewire, a 7 mm x 39 mm stent was deployed centered on the non ostial proximal SMA stenosis. This was inflated to 10 atmospheres. During inflation the patient experienced moderate pain. The balloon was immediately deflated. The abdominal pain quickly resolved. Repeat injection demonstrates wide patency of the stented SMA. No significant residual stenosis. Minor residual narrowing of the SMA ostium, unchanged. There is brisk SMA flow.  Guidewire and sheath removed. Hemostasis obtained with a 6 [place] Exoseal device. No immediate complication. Patient tolerated the procedure well. Final ACT prior to sheath removal was 115.  COMPLICATIONS: No immediate  IMPRESSION: Successful balloon  expandable stent insertion to treat a critical non ostial stenosis of the proximal SMA, estimated at greater than 90%. Stenosis was occlusive by wire access. Following stent placement, no significant residual SMA stenosis with brisk flow demonstrated.   Electronically Signed   By: Ruel Favors M.D.   On: 06/22/2013 12:34   Ir [employer] Stent 1st Art Not Lillette Boxer Car Vert Car  06/22/2013   CLINICAL DATA:  Diabetes, mesenteric vascular disease, chronic mesenteric ischemia, nausea, vomiting, postprandial angina  EXAM: Ultrasound guidance for vascular access  Selective SMA catheterization and angiogram including a flush aortogram  Proximal SMA intravascular stent insertion (7 mm x 39 mmPalmaz genesis  Date:  06/22/2013 12:15 PM12/06/2013 12:15 PM  Radiologist:  Judie Petit. Ruel Favors, MD  Guidance:  Ultrasound and fluoroscopic  MEDICATIONS AND MEDICAL HISTORY: 1 mg Versed, 75 mcg fentanyl, 3000 units heparin  ANESTHESIA/SEDATION: 30 min  CONTRAST:  50mL OMNIPAQUE IOHEXOL 300 MG/ML  SOLN  FLUOROSCOPY TIME:  10 min 6 seconds minutes  PROCEDURE: Informed consent was obtained from the patient following explanation of the procedure, risks, benefits and alternatives. The patient understands, agrees and consents for the procedure. All questions were addressed. A time out was performed.  Maximal barrier sterile technique utilized including caps, mask, sterile gowns, sterile gloves, large sterile drape, hand hygiene, and ChloraPrep.  Previous imaging reviewed.  Under sterile conditions and local anesthesia, right common femoral artery micropuncture access performed with ultrasound. Guidewire advanced easily followed by a dilator set. Initially a 5 French sheath was inserted. Five French pigtail catheter was advanced into the abdominal aorta. Flush aortogram performed in the oblique and lateral projections.  Aortogram: Celiac origin is mildly narrow with plaque formation but no significant stenosis. Non ostial proximal SMA focal  severe stenosis noted with poststenotic dilatation of the main SMA trunk distal to the stenosis. This correlates with the CT. IMA origin is also moderately stenotic however the IMA main trunk is hypertrophied and has extensive collateral communication into the SMA branches, indicative of chronic mesenteric ischemia.  In  the lateral projection, the pigtail catheter was removed. A C2 catheter was utilized to select the SMA origin. Selective SMA angiogram performed. The non ostial proximal SMA focal severe stenosis is re- demonstrated. Measurements were obtained for the appropriate length and diameter. 035 Bentson guidewire was advanced distal to the stenosis. Repeat injection demonstrates occlusion of the SMA stenosis by the wire. 3000 units heparin instilled. Sheath exchanged for a 6 French hockey-stick delivery sheath. This was positioned at the SMA origin. Over the guidewire, a 7 mm x 39 mm stent was deployed centered on the non ostial proximal SMA stenosis. This was inflated to 10 atmospheres. During inflation the patient experienced moderate pain. The balloon was immediately deflated. The abdominal pain quickly resolved. Repeat injection demonstrates wide patency of the stented SMA. No significant residual stenosis. Minor residual narrowing of the SMA ostium, unchanged. There is brisk SMA flow.  Guidewire and sheath removed. Hemostasis obtained with a 6 [place] Exoseal device. No immediate complication. Patient tolerated the procedure well. Final ACT prior to sheath removal was 115.  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 Stenosis was occlusive by wire access. Following stent placement, no significant residual SMA stenosis with brisk flow demonstrated.   Electronically Signed   By: Ruel Favors M.D.   On: 06/22/2013 12:34    Scheduled Meds: . aspirin  325 mg Oral Daily   . benazepril  10 mg Oral Daily  . clopidogrel  75 mg Oral Q breakfast  . enoxaparin (LOVENOX) injection  40 mg Subcutaneous Q24H  . insulin aspart  0-15 Units Subcutaneous TID WC  . insulin aspart  0-5 Units Subcutaneous QHS  . nicotine  21 mg Transdermal Daily  . pantoprazole (PROTONIX) IV  40 mg Intravenous Q12H   Continuous Infusions: . sodium chloride Stopped (06/23/13 1900)    Active Problems:   DM (diabetes mellitus)   Essential hypertension, benign   Other and unspecified hyperlipidemia   History of UTI   Dehydration   Nausea & vomiting   Gastroparesis   Nausea and vomiting   Carotid bruit   Chronic mesenteric ischemia  Time spent:  CHIU, STEPHEN K  [facility] Pager [phone]. If 7PM-7AM, please contact night-coverage at [email or web address], password [facility] 06/24/2013, 8:19 AM  LOS: 5 days

## 2013-06-25 DIAGNOSIS — R0989 Other specified symptoms and signs involving the circulatory and respiratory systems: Secondary | ICD-10-CM

## 2013-06-25 LAB — GLUCOSE, CAPILLARY
Glucose-Capillary: 97 mg/dL (ref 70–99)
Glucose-Capillary: 199 mg/dL — ABNORMAL HIGH (ref 70–99)

## 2013-06-25 LAB — POCT ACTIVATED CLOTTING TIME: Activated Clotting Time: 116 seconds

## 2013-06-25 MED ORDER — CLOPIDOGREL BISULFATE 75 MG PO TABS: 75.0000 mg | ORAL_TABLET | Freq: Every day | ORAL | Status: DC

## 2013-06-25 MED ORDER — POLYETHYLENE GLYCOL 3350 17 G PO PACK
17.0000 g | PACK | Freq: Every day | ORAL | Status: DC
  Administered 2013-06-25: 17 g via ORAL
  Filled 2013-06-25 (×2): qty 1

## 2013-06-25 MED ORDER — BISACODYL 10 MG RE SUPP: 10.0000 mg | Freq: Every day | RECTAL | Status: DC | PRN

## 2013-06-25 MED ORDER — ASPIRIN 325 MG PO TABS: 325.0000 mg | ORAL_TABLET | Freq: Every day | ORAL | Status: DC

## 2013-06-25 NOTE — Progress Notes (Signed)
Patient d/c to home, prescriptions given, instructions reviewed.  IVs removed.

## 2013-06-25 NOTE — Discharge Summary (Signed)
Physician Discharge Summary  Sarah Alvarez ZOX:[id number] DOB: 1950-08-31 DOA: 06/19/2013  PCP: Doris Cheadle, MD  Admit date: 06/19/2013 Discharge date: 06/25/2013  Time spent: 35 minutes  Recommendations for Outpatient Follow-up:  1. Follow up with PCP in 1-2 weeks 2. Follow up with Vascular Surgery as scheduled  Discharge Diagnoses:  Active Problems:   DM (diabetes mellitus)   Essential hypertension, benign   Other and unspecified hyperlipidemia   History of UTI   Dehydration   Nausea & vomiting   Gastroparesis   Nausea and vomiting   Carotid bruit   Chronic mesenteric ischemia   Discharge Condition: Improved  Diet recommendation: Diabetic  Filed Weights   06/22/13 0700  Weight: 61.6 kg (135 lb 12.9 oz)    History of present illness:  Sarah Alvarez is a 62 y.o. female with a past medical history of tobacco abuse, type 2 diabetes mellitus, hypertension and dyslipidemia who presents to the emergency department this evening with complaints of recurrent episodes of nausea/vomiting as well as epigastric pain. She was recently seen in the emergency Department at [facility] at which time she presented with complaints of abdominal pain, undergoing a CT scan of abdomen and pelvis which showed no acute intra-abdominal or intrapelvic abnormality. She was also recently diagnosed with a urinary tract infection and discharged on Keflex. She reports having abdominal pain located in the epigastric and lower abdominal region since Thanksgiving characterized as sharp, stabbing, worsened by by mouth intake particularly carbonated beverages. Over the last several days she has had multiple episodes of nonbloody emesis, unable to keep by mouth down including her medications. She reports accompanying symptoms of dizziness and lightheadedness particularly going from sitting to standing. She denies black tarry stools, bright red blood per rectum or hematemesis. She denies alcohol use. Initial  lab work in the emergency room showed a sodium level of 131, normal kidney function. Her white count was elevated at 16,500. She denies recent travel or sick contacts.   Hospital Course:  Intractable nausea/vomiting. Patient presenting with multiple episodes of nausea and vomiting, unable to keep by mouth down, becoming dehydrated. GI was consulted initially. The patient was felt to have likely mesenteric ischemia given CT findings of diffuse stenosis, including at the SMA. Vasc Surgery was then consulted, who recommended IR guided angiography with stent placement on 06/22/13. The patient has since felt much improved and is tolerating PO well.   Dehydration. Patient clinically dehydrated, presenting with hypotension that responded to IV fluid resuscitation.   Hyponatremia. Patient's lab work showing a sodium of 131 which could be secondary to hypotonic hypovolemic hyponatremia. Resolved   History of hypertension. Hypotension resolved. Resumed ACEI.   Type 2 diabetes mellitus. Will discontinue hypoglycemics as patient has not taken in by mouth, place her on Accu-Cheks every 4 hours with sliding scale coverage.   ICA stenosis  1. 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 Asymptomatic 3. Vascular surgery to follow up in a few weeks for elective endarterectomy in January 4. Pt will be discharged on aspirin and plavix  Nutrition. Diabetic diet   DVT prophylaxis. Lovenox while in the hospital  Consultations:  GI  Vascular Surgery  Discharge Exam: Filed Vitals:   06/23/13 2300 06/24/13 0558 06/24/13 1449 06/24/13 2125  BP: 142/79 151/65 152/71 160/72  Pulse: 84 70 76 72  Temp: 98.2 F (36.8 C) 98.6 F (37 C) 97.5 F (36.4 C) 97.7 F (36.5 C)  TempSrc:   Oral   Resp: 18 17 20 20   Height:      Weight:      SpO2: 99% 100% 100% 100%    General: Awake,in nad Cardiovascular: regular, s1, s2 Respiratory: normal resp effort,  no wheezing  Discharge Instructions       Future Appointments Provider Department Dept Phone   07/09/2013 4:15 PM Doris Cheadle, MD [facility] [phone]   08/13/2013 9:00 AM [facility] [phone]       Medication List    STOP taking these medications       sulfamethoxazole-trimethoprim 800-160 MG per tablet  Commonly known as:  SEPTRA DS      TAKE these medications       aspirin 325 MG tablet  Take 1 tablet (325 mg total) by mouth daily.     atorvastatin 20 MG tablet  Commonly known as:  LIPITOR  Take 1 tablet (20 mg total) by mouth daily.     benazepril 10 MG tablet  Commonly known as:  LOTENSIN  Take 1 tablet (10 mg total) by mouth daily.     clopidogrel 75 MG tablet  Commonly known as:  PLAVIX  Take 1 tablet (75 mg total) by mouth daily with breakfast.     glipiZIDE 5 MG tablet  Commonly known as:  GLUCOTROL  Take 1 tablet (5 mg total) by mouth 2 (two) times daily before a meal.     HYDROcodone-acetaminophen 5-325 MG per tablet  Commonly known as:  NORCO/VICODIN  Take 2 tablets by mouth every 4 (four) hours as needed.     insulin glargine 100 UNIT/ML injection  Commonly known as:  LANTUS  Inject 0.6 mLs (60 Units total) into the skin at bedtime.     insulin NPH-regular (70-30) 100 UNIT/ML injection  Commonly known as:  NOVOLIN 70/30  Inject 30 Units into the skin 2 (two) times daily with a meal.     metFORMIN 500 MG (MOD) 24 hr tablet  Commonly known as:  GLUMETZA  Take 500 mg by mouth 2 (two) times daily with a meal.     nicotine 21 mg/24hr patch  Commonly known as:  NICODERM CQ  Place 1 patch (21 mg total) onto the skin daily.     promethazine 25 MG tablet  Commonly known as:  PHENERGAN  Take 1 tablet (25 mg total) by mouth every 6 (six) hours as needed for nausea or vomiting.     SALONPAS ARTHRITIS PAIN RELIEF Pads  Apply 1 each topically at bedtime as needed (pain on  hand).       No Known Allergies Follow-up Information   Follow up with [facility]. (Appointment scheduled 07/09/13 @ 4:15pm)    Contact information:   [address] [place] [address] [phone]      Follow up with DICKSON,CHRISTOPHER S, MD. (follow up as scheduled)    Specialty:  Vascular Surgery   Contact information:   [address] [place] [address] [phone]        The results of significant diagnostics from this hospitalization (including imaging, microbiology, ancillary and laboratory) are listed below for reference.    Significant Diagnostic Studies: Ct Angio Neck W/cm &/or Wo/cm  06/24/2013   CLINICAL DATA:  Right carotid stenosis.  Abnormal carotid Doppler.  EXAM: CT ANGIOGRAPHY NECK  TECHNIQUE: Multidetector CT imaging of the neck was performed using the standard protocol during  bolus administration of intravenous contrast. Multiplanar CT image reconstructions including MIPs were obtained to evaluate the vascular anatomy. Carotid stenosis measurements (when applicable) are obtained utilizing NASCET criteria, using the distal internal carotid diameter as the denominator.  CONTRAST:  50mL OMNIPAQUE IOHEXOL 350 MG/ML SOLN  COMPARISON:  None  FINDINGS: Severe diffuse atherosclerotic disease. There is atherosclerotic disease in the aortic arch. Three vessel aortic arch. The innominate artery has atherosclerotic disease without significant stenosis. There is a severe stenosis at the origin of the left common carotid artery narrowed by approximately 90% diameter stenosis. There is diffuse disease throughout the left common carotid artery. Atherosclerotic disease in the left proximal subclavian artery with calcified plaque. This narrows the lumen by approximately 75% diameter stenosis.  Right carotid artery: Common carotid artery is patent on the right. There is complex calcified plaque at the right carotid bifurcation extending into the  right internal and external carotid arteries. Multiple areas of stenosis in the internal carotid artery measuring up to 80% diameter stenosis. 50% diameter stenosis proximal right external carotid artery. Right internal carotid artery is patent to the skullbase.  Left carotid artery: 90% stenosis at the origin of the left common carotid artery. Diffuse disease in the common carotid artery which is small in caliber due to low flow. Calcified and noncalcified plaque at the left carotid bifurcation narrowing the lumen by approximately 40% diameter stenosis. Small caliber left internal carotid artery due to proximal flow limiting stenosis. This causes thickening of the internal carotid artery wall. There is diffuse atherosclerotic disease in the cervical internal carotid artery.  Right vertebral: Dominant right vertebral artery which is widely patent without significant stenosis.  Left vertebral: Severely disease left vertebral artery with multiple areas of critical stenosis and occlusion and reconstitution. Distal left vertebral artery is patent and contributes to the basilar.  Review of the MIP images confirms the above findings.  IMPRESSION: Severe diffuse atherosclerotic disease.  80% diameter stenosis of the right internal carotid artery.  90% diameter stenosis at the origin of the left common carotid artery just above the aortic arch. There is diffuse disease in the left common carotid artery and left internal carotid artery. Calcified and noncalcified plaque of the carotid bifurcation with 40% diameter stenosis of the proximal left internal carotid artery.  Right vertebral artery widely patent  Small left vertebral artery which is diffusely diseased with multiple segmental areas of occlusion and reconstitution.   Electronically Signed   By: Marlan Palau M.D.   On: 06/24/2013 13:01   Nm Gastric Emptying  06/20/2013   CLINICAL DATA:  Abdominal pain. Nausea, vomiting, reflux. History of diabetes.  EXAM:  NUCLEAR MEDICINE GASTRIC EMPTYING SCAN  TECHNIQUE: After oral ingestion of radiolabeled meal, sequential abdominal images were obtained for 120 minutes. Residual percentage of activity remaining within the stomach was calculated at 60 and 120 minutes.  COMPARISON:  CT of the abdomen and pelvis on 06/14/2013  RADIOPHARMACEUTICALS:  2.0Technetium 99-M labeled sulfur colloid cooked with scrambled egg  FINDINGS: Expected location of the stomach in the left upper quadrant. Ingested meal empties the stomach gradually over the course of the study with 42 % retention at 120 min (normal retention less than 30% at a 120 min).  IMPRESSION: Delayed gastric emptying.   Electronically Signed   By: Rosalie Gums M.D.   On: 06/20/2013 17:53   Ct Abdomen Pelvis W Contrast  06/14/2013   CLINICAL DATA:  Abdominal pain and diarrhea, history diabetes  EXAM: CT ABDOMEN AND PELVIS  WITH CONTRAST  TECHNIQUE: Multidetector CT imaging of the abdomen and pelvis was performed using the standard protocol following bolus administration of intravenous contrast. Sagittal and coronal MPR images reconstructed from axial data set.  CONTRAST:  50mL OMNIPAQUE IOHEXOL 300 MG/ML SOLN orally, OMNIPAQUE IOHEXOL 300 MG/ML SOLN IV  COMPARISON:  11/20/2009  FINDINGS: Lung bases clear.  Extensive atherosclerotic calcifications including coronary arteries, proximal celiac artery, proximal SMA, and renal arteries.  Gallbladder in uterus surgically absent.  Liver, spleen, pancreas, kidneys, and adrenal glands normal appearance.  Unremarkable left ovary with nonvisualization of right ovary.  Normal appendix.  Suboptimal GI contrast with large and small bowel loops unopacified and under distended.  No definite focal gastric or bowel abnormality identified.  No mass, adenopathy, free fluid, free air, hernia, or inflammatory process.  Bones appear demineralized with note of a diffusely bulging disc at L5-S1.  IMPRESSION: No acute intra-abdominal or intrapelvic  abnormalities.  Extensive atherosclerotic calcifications including coronary arteries, proximal celiac artery, proximal SMA and renal arteries.   Electronically Signed   By: Ulyses Southward M.D.   On: 06/14/2013 20:33   Ir Angiogram Visceral Selective  06/22/2013   CLINICAL DATA:  Diabetes, mesenteric vascular disease, chronic mesenteric ischemia, nausea, vomiting, postprandial angina  EXAM: Ultrasound guidance for vascular access  Selective SMA catheterization and angiogram including a flush aortogram  Proximal SMA intravascular stent insertion (7 mm x 39 mmPalmaz genesis  Date:  06/22/2013 12:15 PM12/06/2013 12:15 PM  Radiologist:  Judie Petit. Ruel Favors, MD  Guidance:  Ultrasound and fluoroscopic  MEDICATIONS AND MEDICAL HISTORY: 1 mg Versed, 75 mcg fentanyl, 3000 units heparin  ANESTHESIA/SEDATION: 30 min  CONTRAST:  50mL OMNIPAQUE IOHEXOL 300 MG/ML  SOLN  FLUOROSCOPY TIME:  10 min 6 seconds minutes  PROCEDURE: Informed consent was obtained from the patient following explanation of the procedure, risks, benefits and alternatives. The patient understands, agrees and consents for the procedure. All questions were addressed. A time out was performed.  Maximal barrier sterile technique utilized including caps, mask, sterile gowns, sterile gloves, large sterile drape, hand hygiene, and ChloraPrep.  Previous imaging reviewed.  Under sterile conditions and local anesthesia, right common femoral artery micropuncture access performed with ultrasound. Guidewire advanced easily followed by a dilator set. Initially a 5 French sheath was inserted. Five French pigtail catheter was advanced into the abdominal aorta. Flush aortogram performed in the oblique and lateral projections.  Aortogram: Celiac origin is mildly narrow with plaque formation but no significant stenosis. Non ostial proximal SMA focal severe stenosis noted with poststenotic dilatation of the main SMA trunk distal to the stenosis. This correlates with the CT. IMA  origin is also moderately stenotic however the IMA main trunk is hypertrophied and has extensive collateral communication into the SMA branches, indicative of chronic mesenteric ischemia.  In the lateral projection, the pigtail catheter was removed. A C2 catheter was utilized to select the SMA origin. Selective SMA angiogram performed. The non ostial proximal SMA focal severe stenosis is re- demonstrated. Measurements were obtained for the appropriate length and diameter. 035 Bentson guidewire was advanced distal to the stenosis. Repeat injection demonstrates occlusion of the SMA stenosis by the wire. 3000 units heparin instilled. Sheath exchanged for a 6 French hockey-stick delivery sheath. This was positioned at the SMA origin. Over the guidewire, a 7 mm x 39 mm stent was deployed centered on the non ostial proximal SMA stenosis. This was inflated to 10 atmospheres. During inflation the patient experienced moderate pain. The balloon was immediately  deflated. The abdominal pain quickly resolved. Repeat injection demonstrates wide patency of the stented SMA. No significant residual stenosis. Minor residual narrowing of the SMA ostium, unchanged. There is brisk SMA flow.  Guidewire and sheath removed. Hemostasis obtained with a 6 [place] Exoseal device. No immediate complication. Patient tolerated the procedure well. Final ACT prior to sheath removal was 115.  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 Stenosis was occlusive by wire access. Following stent placement, no significant residual SMA stenosis with brisk flow demonstrated.   Electronically Signed   By: Ruel Favors M.D.   On: 06/22/2013 12:34   Ir US Guide Vasc Access Right  06/22/2013   CLINICAL DATA:  Diabetes, mesenteric vascular disease, chronic mesenteric ischemia, nausea, vomiting, postprandial angina  EXAM:  Ultrasound guidance for vascular access  Selective SMA catheterization and angiogram including a flush aortogram  Proximal SMA intravascular stent insertion (7 mm x 39 mmPalmaz genesis  Date:  06/22/2013 12:15 PM12/06/2013 12:15 PM  Radiologist:  Judie Petit. Ruel Favors, MD  Guidance:  Ultrasound and fluoroscopic  MEDICATIONS AND MEDICAL HISTORY: 1 mg Versed, 75 mcg fentanyl, 3000 units heparin  ANESTHESIA/SEDATION: 30 min  CONTRAST:  50mL OMNIPAQUE IOHEXOL 300 MG/ML  SOLN  FLUOROSCOPY TIME:  10 min 6 seconds minutes  PROCEDURE: Informed consent was obtained from the patient following explanation of the procedure, risks, benefits and alternatives. The patient understands, agrees and consents for the procedure. All questions were addressed. A time out was performed.  Maximal barrier sterile technique utilized including caps, mask, sterile gowns, sterile gloves, large sterile drape, hand hygiene, and ChloraPrep.  Previous imaging reviewed.  Under sterile conditions and local anesthesia, right common femoral artery micropuncture access performed with ultrasound. Guidewire advanced easily followed by a dilator set. Initially a 5 French sheath was inserted. Five French pigtail catheter was advanced into the abdominal aorta. Flush aortogram performed in the oblique and lateral projections.  Aortogram: Celiac origin is mildly narrow with plaque formation but no significant stenosis. Non ostial proximal SMA focal severe stenosis noted with poststenotic dilatation of the main SMA trunk distal to the stenosis. This correlates with the CT. IMA origin is also moderately stenotic however the IMA main trunk is hypertrophied and has extensive collateral communication into the SMA branches, indicative of chronic mesenteric ischemia.  In the lateral projection, the pigtail catheter was removed. A C2 catheter was utilized to select the SMA origin. Selective SMA angiogram performed. The non ostial proximal SMA focal severe stenosis is re-  demonstrated. Measurements were obtained for the appropriate length and diameter. 035 Bentson guidewire was advanced distal to the stenosis. Repeat injection demonstrates occlusion of the SMA stenosis by the wire. 3000 units heparin instilled. Sheath exchanged for a 6 French hockey-stick delivery sheath. This was positioned at the SMA origin. Over the guidewire, a 7 mm x 39 mm stent was deployed centered on the non ostial proximal SMA stenosis. This was inflated to 10 atmospheres. During inflation the patient experienced moderate pain. The balloon was immediately deflated. The abdominal pain quickly resolved. Repeat injection demonstrates wide patency of the stented SMA. No significant residual stenosis. Minor residual narrowing of the SMA ostium, unchanged. There is brisk SMA flow.  Guidewire and sheath removed. Hemostasis obtained with a 6 [place] Exoseal device. No immediate complication. Patient tolerated the procedure well. Final ACT prior to sheath removal was 115.  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 Stenosis was occlusive by wire access. Following stent placement, no significant residual SMA stenosis with brisk flow demonstrated.   Electronically Signed   By: Ruel Favors M.D.   On: 06/22/2013 12:34   Ir [employer] Stent 1st Art Not Lillette Boxer Car Vert Car  06/22/2013   CLINICAL DATA:  Diabetes, mesenteric vascular disease, chronic mesenteric ischemia, nausea, vomiting, postprandial angina  EXAM: Ultrasound guidance for vascular access  Selective SMA catheterization and angiogram including a flush aortogram  Proximal SMA intravascular stent insertion (7 mm x 39 mmPalmaz genesis  Date:  06/22/2013 12:15 PM12/06/2013 12:15 PM  Radiologist:  Judie Petit. Ruel Favors, MD  Guidance:  Ultrasound and fluoroscopic  MEDICATIONS AND MEDICAL HISTORY: 1 mg Versed, 75 mcg fentanyl, 3000  units heparin  ANESTHESIA/SEDATION: 30 min  CONTRAST:  50mL OMNIPAQUE IOHEXOL 300 MG/ML  SOLN  FLUOROSCOPY TIME:  10 min 6 seconds minutes  PROCEDURE: Informed consent was obtained from the patient following explanation of the procedure, risks, benefits and alternatives. The patient understands, agrees and consents for the procedure. All questions were addressed. A time out was performed.  Maximal barrier sterile technique utilized including caps, mask, sterile gowns, sterile gloves, large sterile drape, hand hygiene, and ChloraPrep.  Previous imaging reviewed.  Under sterile conditions and local anesthesia, right common femoral artery micropuncture access performed with ultrasound. Guidewire advanced easily followed by a dilator set. Initially a 5 French sheath was inserted. Five French pigtail catheter was advanced into the abdominal aorta. Flush aortogram performed in the oblique and lateral projections.  Aortogram: Celiac origin is mildly narrow with plaque formation but no significant stenosis. Non ostial proximal SMA focal severe stenosis noted with poststenotic dilatation of the main SMA trunk distal to the stenosis. This correlates with the CT. IMA origin is also moderately stenotic however the IMA main trunk is hypertrophied and has extensive collateral communication into the SMA branches, indicative of chronic mesenteric ischemia.  In the lateral projection, the pigtail catheter was removed. A C2 catheter was utilized to select the SMA origin. Selective SMA angiogram performed. The non ostial proximal SMA focal severe stenosis is re- demonstrated. Measurements were obtained for the appropriate length and diameter. 035 Bentson guidewire was advanced distal to the stenosis. Repeat injection demonstrates occlusion of the SMA stenosis by the wire. 3000 units heparin instilled. Sheath exchanged for a 6 French hockey-stick delivery sheath. This was positioned at the SMA origin. Over the guidewire, a 7 mm x 39 mm  stent was deployed centered on the non ostial proximal SMA stenosis. This was inflated to 10 atmospheres. During inflation the patient experienced moderate pain. The balloon was immediately deflated. The abdominal pain quickly resolved. Repeat injection demonstrates wide patency of the stented SMA. No significant residual stenosis. Minor residual narrowing of the SMA ostium, unchanged. There is brisk SMA flow.  Guidewire and sheath removed. Hemostasis obtained with a 6 [place] Exoseal device. No immediate complication. Patient tolerated the procedure well. Final ACT prior to sheath removal was 115.  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 Stenosis was occlusive by wire access. Following stent placement, no significant residual SMA stenosis with brisk flow demonstrated.   Electronically Signed   By: Ruel Favors M.D.   On: 06/22/2013 12:34    Microbiology:  Recent Results (from the past 240 hour(s))  URINE CULTURE     Status: None   Collection Time    06/19/13  5:51 PM      Result Value Range Status   Specimen Description URINE, CLEAN CATCH   Final   Special Requests NONE   Final   Culture  Setup Time     Final   Value: 06/19/2013 22:26     Performed at [facility] Count     Final   Value: NO GROWTH     Performed at [facility]   Culture     Final   Value: NO GROWTH     Performed at [facility]   Report Status 06/20/2013 FINAL   Final  SURGICAL PCR SCREEN     Status: None   Collection Time    06/20/13 12:41 AM      Result Value Range Status   MRSA, PCR NEGATIVE  NEGATIVE Final   Staphylococcus aureus NEGATIVE  NEGATIVE Final   Comment:            The Xpert SA Assay (FDA     approved for NASAL specimens     in patients over 34 years of age),     is one component of     a comprehensive surveillance     program.   Test performance has     been validated by [employer] for patients greater     than or equal to 39 year old.     It is not intended     to diagnose infection nor to     guide or monitor treatment.     Labs: Basic Metabolic Panel:  Recent Labs Lab 06/19/13 1412 06/20/13 0435 06/24/13 0915  NA 131* 137 136  K 4.6 4.0 3.7  CL 97 103 101  CO2 22 20 26   GLUCOSE 164* 75 224*  BUN 18 13 6   CREATININE 0.50 0.34* 0.45*  CALCIUM 9.3 8.7 8.5  MG 1.7  --   --   PHOS 3.5  --   --    Liver Function Tests:  Recent Labs Lab 06/20/13 0435  AST 14  ALT 6  ALKPHOS 92  BILITOT 0.5  PROT 6.0  ALBUMIN 3.1*   No results found for this basename: LIPASE, AMYLASE,  in the last 168 hours No results found for this basename: AMMONIA,  in the last 168 hours CBC:  Recent Labs Lab 06/19/13 1412 06/20/13 0435  WBC 16.5* 10.9*  NEUTROABS 11.9*  --   HGB 14.5 12.9  HCT 40.6 37.6  MCV 92.3 93.8  PLT 293 273   Cardiac Enzymes: No results found for this basename: CKTOTAL, CKMB, CKMBINDEX, TROPONINI,  in the last 168 hours BNP: BNP (last 3 results) No results found for this basename: PROBNP,  in the last 8760 hours CBG:  Recent Labs Lab 06/24/13 1213 06/24/13 1606 06/24/13 2230 06/25/13 0701 06/25/13 1108  GLUCAP 215* 138* 173* 138* 199*   Signed:  CHIU, STEPHEN K  [facility] 06/25/2013, 11:48 AM

## 2013-06-25 NOTE — Progress Notes (Signed)
Subjective: No acute events.  She feels much better after the SMA stenting.  No further abdominal pain.  Objective: Vital signs in last 24 hours: Temp:  [97.5 F (36.4 C)-97.7 F (36.5 C)] 97.7 F (36.5 C) (12/14 2125) Pulse Rate:  [72-76] 72 (12/14 2125) Resp:  [20] 20 (12/14 2125) BP: (152-160)/(71-72) 160/72 mmHg (12/14 2125) SpO2:  [100 %] 100 % (12/14 2125) Last BM Date: 06/18/13  Intake/Output from previous day: 12/14 0701 - 12/15 0700 In: 800 [P.O.:800] Out: -  Intake/Output this shift:    General appearance: alert and no distress GI: soft, non-tender; bowel sounds normal; no masses,  no organomegaly  Lab Results: No results found for this basename: WBC, HGB, HCT, PLT,  in the last 72 hours BMET  Recent Labs  06/24/13 0915  NA 136  K 3.7  CL 101  CO2 26  GLUCOSE 224*  BUN 6  CREATININE 0.45*  CALCIUM 8.5   LFT No results found for this basename: PROT, ALBUMIN, AST, ALT, ALKPHOS, BILITOT, BILIDIR, IBILI,  in the last 72 hours PT/INR No results found for this basename: LABPROT, INR,  in the last 72 hours Hepatitis Panel No results found for this basename: HEPBSAG, HCVAB, HEPAIGM, HEPBIGM,  in the last 72 hours C-Diff No results found for this basename: CDIFFTOX,  in the last 72 hours Fecal Lactopherrin No results found for this basename: FECLLACTOFRN,  in the last 72 hours  Studies/Results: Ct Angio Neck W/cm &/or Wo/cm  06/24/2013   CLINICAL DATA:  Right carotid stenosis.  Abnormal carotid Doppler.  EXAM: CT ANGIOGRAPHY NECK  TECHNIQUE: Multidetector CT imaging of the neck was performed using the standard protocol during bolus administration of intravenous contrast. Multiplanar CT image reconstructions including MIPs were obtained to evaluate the vascular anatomy. Carotid stenosis measurements (when applicable) are obtained utilizing NASCET criteria, using the distal internal carotid diameter as the denominator.  CONTRAST:  50mL OMNIPAQUE IOHEXOL 350 MG/ML  SOLN  COMPARISON:  None  FINDINGS: Severe diffuse atherosclerotic disease. There is atherosclerotic disease in the aortic arch. Three vessel aortic arch. The innominate artery has atherosclerotic disease without significant stenosis. There is a severe stenosis at the origin of the left common carotid artery narrowed by approximately 90% diameter stenosis. There is diffuse disease throughout the left common carotid artery. Atherosclerotic disease in the left proximal subclavian artery with calcified plaque. This narrows the lumen by approximately 75% diameter stenosis.  Right carotid artery: Common carotid artery is patent on the right. There is complex calcified plaque at the right carotid bifurcation extending into the right internal and external carotid arteries. Multiple areas of stenosis in the internal carotid artery measuring up to 80% diameter stenosis. 50% diameter stenosis proximal right external carotid artery. Right internal carotid artery is patent to the skullbase.  Left carotid artery: 90% stenosis at the origin of the left common carotid artery. Diffuse disease in the common carotid artery which is small in caliber due to low flow. Calcified and noncalcified plaque at the left carotid bifurcation narrowing the lumen by approximately 40% diameter stenosis. Small caliber left internal carotid artery due to proximal flow limiting stenosis. This causes thickening of the internal carotid artery wall. There is diffuse atherosclerotic disease in the cervical internal carotid artery.  Right vertebral: Dominant right vertebral artery which is widely patent without significant stenosis.  Left vertebral: Severely disease left vertebral artery with multiple areas of critical stenosis and occlusion and reconstitution. Distal left vertebral artery is patent and contributes to  the basilar.  Review of the MIP images confirms the above findings.  IMPRESSION: Severe diffuse atherosclerotic disease.  80% diameter  stenosis of the right internal carotid artery.  90% diameter stenosis at the origin of the left common carotid artery just above the aortic arch. There is diffuse disease in the left common carotid artery and left internal carotid artery. Calcified and noncalcified plaque of the carotid bifurcation with 40% diameter stenosis of the proximal left internal carotid artery.  Right vertebral artery widely patent  Small left vertebral artery which is diffusely diseased with multiple segmental areas of occlusion and reconstitution.   Electronically Signed   By: Marlan Palau M.D.   On: 06/24/2013 13:01    Medications:  Scheduled: . aspirin  325 mg Oral Daily  . benazepril  10 mg Oral Daily  . clopidogrel  75 mg Oral Q breakfast  . enoxaparin (LOVENOX) injection  40 mg Subcutaneous Q24H  . insulin aspart  0-15 Units Subcutaneous TID WC  . insulin aspart  0-5 Units Subcutaneous QHS  . nicotine  21 mg Transdermal Daily  . pantoprazole  40 mg Oral BID   Continuous: . sodium chloride Stopped (06/23/13 1900)    Assessment/Plan: 1) Chronic mesenteric ischemia. 2) Severe atherosclerosis. 3) DM. 4) Mildly delayed gastric emptying.   The patient is well at this time.  Her GI symptoms have resolved with the SMA stenting.  No further GI evaluation required.  Plan: 1) Management per Hospitalist. 2) Signing off.    LOS: 6 days   Sarah Alvarez D 06/25/2013, 8:06 AM

## 2013-06-25 NOTE — Care Management Note (Signed)
CARE MANAGEMENT NOTE 06/25/2013  Patient:  Sarah Alvarez, Sarah Alvarez   Account Number:  [id number]  Date Initiated:  06/25/2013  Documentation initiated by:  Vance Peper  Subjective/Objective Assessment:   62 yr old female admitted with dehydration, N/V     Action/Plan:   CM contacted [employer] [facility] [employer].   Anticipated DC Date:  06/25/2013   Anticipated DC Plan:  HOME/SELF CARE      DC Planning Services  CM consult  Follow-up appt scheduled      Choice offered to / List presented to:             Status of service:  Completed, signed off Discharge Disposition:  HOME/SELF CARE      Comments:  06/25/13 10:47AM Vance Peper, RN BSN Case Manager contacted [facility] concerning appointment for followup. Patient is already active with them. Appointment is for December 29,2014 @ 4:15pm. Will provide patient with this information.

## 2013-06-25 NOTE — Progress Notes (Signed)
  VASCULAR PROGRESS NOTE  SUBJECTIVE: Abdominal pain is improved.  PHYSICAL EXAM: Filed Vitals:   06/23/13 2300 06/24/13 0558 06/24/13 1449 06/24/13 2125  BP: 142/79 151/65 152/71 160/72  Pulse: 84 70 76 72  Temp: 98.2 F (36.8 C) 98.6 F (37 C) 97.5 F (36.4 C) 97.7 F (36.5 C)  TempSrc:   Oral   Resp: 18 17 20 20  Height:      Weight:      SpO2: 99% 100% 100% 100%   Bilat carotid bruits. Neuro intact  LABS: Lab Results  Component Value Date   WBC 10.9* 06/20/2013   HGB 12.9 06/20/2013   HCT 37.6 06/20/2013   MCV 93.8 06/20/2013   PLT 273 06/20/2013   Lab Results  Component Value Date   CREATININE 0.45* 06/24/2013   Lab Results  Component Value Date   INR 0.91 06/21/2013   CBG (last 3)   Recent Labs  06/24/13 1606 06/24/13 2230 06/25/13 0701  GLUCAP 138* 173* 138*   CAROTID DUPLEX: There is a greater than 80% right carotid stenosis.  There is also a severe external carotid artery stenosis on the right. There is no significant stenosis on the left internal carotid artery.  CT ANGIOGRAM OF THE NECK: Confirms the greater than 80% right internal carotid artery stenosis. There is also a significant stenosis at the origin of the left common carotid artery just above the aortic arch. Is a 40% stenosis of the proximal left internal carotid artery.  Active Problems:   DM (diabetes mellitus)   Essential hypertension, benign   Other and unspecified hyperlipidemia   History of UTI   Dehydration   Nausea & vomiting   Gastroparesis   Nausea and vomiting   Carotid bruit   Chronic mesenteric ischemia  ASSESSMENT AND PLAN:  *  S/P PTA stent of SMA. Doing well. On ASA and Plavix  *  > 80% right carotid stenosis:  I will arrange elective right carotid endarterectomy after she has had a chance to regain her strength after this recent admission. I have reviewed the indications for carotid endarterectomy, that is to lower the risk of future stroke. I have also reviewed  the potential complications of surgery, including but not limited to: bleeding, stroke (perioperative risk 1-2%), MI, nerve injury of other unpredictable medical problems. All of the patients questions were answered and they are agreeable to proceed with surgery. We will schedule her surgery in early January. She is okay for discharge from our standpoint. She will remain on her aspirin and Plavix.   Chris Dickson Beeper: [phone] 06/25/2013    

## 2013-06-25 NOTE — Progress Notes (Signed)
[facility] PROGRESS NOTE  Sarah Alvarez ZOX:[id number] DOB: 09/14/1950 DOA: 06/19/2013 PCP: Doris Cheadle, MD  Assessment/Plan: 1. Intractable nausea/vomiting. Patient presenting with multiple episodes of nausea and vomiting, unable to keep by mouth down, becoming dehydrated. Appreciate GI and Vasc Surgery input. Pt is s/p IR guided angiography 06/22/13 with stent placement. Feeling much better. 2. Dehydration. Patient clinically dehydrated, presenting with hypotension that responded to IV fluid resuscitation. 3. Hyponatremia. Patient's lab work showing a sodium of 131 which could be secondary to hypotonic hypovolemic hyponatremia. Resolved 4. History of hypertension. Hypotension resolved. Resumed ACEI. 5. Type 2 diabetes mellitus. Will discontinue hypoglycemics as patient has not taken in by mouth, place her on Accu-Cheks every 4 hours with sliding scale coverage. 6. ICA stenosis 1. 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 Asymptomatic 3. ?i if ntervention recommended - will await Vasc Surgery recs 7. Nutrition. Diabetic diet 8. DVT prophylaxis. Lovenox  Code Status: Full Family Communication: Pt in room (indicate person spoken with, relationship, and if by phone, the number) Disposition Plan: Pending  Consultants:  GI  Vascular surgery  IR  Procedures - Mesenteric angiography with stent placement 06/22/13 by IR - B carotid dopplers - >80% RICA stenosis - CTA neck 06/24/13 - >80% RICA stenosis, 40% stenosis LICA  HPI/Subjective: No acute events noted overnight. Reports feeling better.  Objective: Filed Vitals:   06/23/13 2300 06/24/13 0558 06/24/13 1449 06/24/13 2125  BP: 142/79 151/65 152/71 160/72  Pulse: 84 70 76 72  Temp: 98.2 F (36.8 C) 98.6 F (37 C) 97.5 F (36.4 C) 97.7 F (36.5 C)  TempSrc:   Oral   Resp: 18 17 20 20   Height:      Weight:      SpO2: 99% 100% 100% 100%    Intake/Output  Summary (Last 24 hours) at 06/25/13 0825 Last data filed at 06/24/13 1725  Gross per 24 hour  Intake    480 ml  Output      0 ml  Net    480 ml   Filed Weights   06/22/13 0700  Weight: 61.6 kg (135 lb 12.9 oz)    Exam:   General:  Awake, in nad  Cardiovascular: regular, s1, s2  Respiratory: normal resp effort, no wheezing  Abdomen: soft, pos BS  Musculoskeletal: perfused, no clubbing   Data Reviewed: Basic Metabolic Panel:  Recent Labs Lab 06/19/13 1412 06/20/13 0435 06/24/13 0915  NA 131* 137 136  K 4.6 4.0 3.7  CL 97 103 101  CO2 22 20 26   GLUCOSE 164* 75 224*  BUN 18 13 6   CREATININE 0.50 0.34* 0.45*  CALCIUM 9.3 8.7 8.5  MG 1.7  --   --   PHOS 3.5  --   --    Liver Function Tests:  Recent Labs Lab 06/20/13 0435  AST 14  ALT 6  ALKPHOS 92  BILITOT 0.5  PROT 6.0  ALBUMIN 3.1*   No results found for this basename: LIPASE, AMYLASE,  in the last 168 hours No results found for this basename: AMMONIA,  in the last 168 hours CBC:  Recent Labs Lab 06/19/13 1412 06/20/13 0435  WBC 16.5* 10.9*  NEUTROABS 11.9*  --   HGB 14.5 12.9  HCT 40.6 37.6  MCV 92.3 93.8  PLT 293 273   Cardiac Enzymes: No results found for this basename: CKTOTAL, CKMB, CKMBINDEX, TROPONINI,  in the last 168 hours BNP (last 3  results) No results found for this basename: PROBNP,  in the last 8760 hours CBG:  Recent Labs Lab 06/24/13 0629 06/24/13 1213 06/24/13 1606 06/24/13 2230 06/25/13 0701  GLUCAP 158* 215* 138* 173* 138*    Recent Results (from the past 240 hour(s))  URINE CULTURE     Status: None   Collection Time    06/19/13  5:51 PM      Result Value Range Status   Specimen Description URINE, CLEAN CATCH   Final   Special Requests NONE   Final   Culture  Setup Time     Final   Value: 06/19/2013 22:26     Performed at [facility] Count     Final   Value: NO GROWTH     Performed at [facility]   Culture     Final   Value:  NO GROWTH     Performed at [facility]   Report Status 06/20/2013 FINAL   Final  SURGICAL PCR SCREEN     Status: None   Collection Time    06/20/13 12:41 AM      Result Value Range Status   MRSA, PCR NEGATIVE  NEGATIVE Final   Staphylococcus aureus NEGATIVE  NEGATIVE Final   Comment:            The Xpert SA Assay (FDA     approved for NASAL specimens     in patients over 54 years of age),     is one component of     a comprehensive surveillance     program.  Test performance has     been validated by [employer] for patients greater     than or equal to 74 year old.     It is not intended     to diagnose infection nor to     guide or monitor treatment.     Studies: Ct Angio Neck W/cm &/or Wo/cm  06/24/2013   CLINICAL DATA:  Right carotid stenosis.  Abnormal carotid Doppler.  EXAM: CT ANGIOGRAPHY NECK  TECHNIQUE: Multidetector CT imaging of the neck was performed using the standard protocol during bolus administration of intravenous contrast. Multiplanar CT image reconstructions including MIPs were obtained to evaluate the vascular anatomy. Carotid stenosis measurements (when applicable) are obtained utilizing NASCET criteria, using the distal internal carotid diameter as the denominator.  CONTRAST:  50mL OMNIPAQUE IOHEXOL 350 MG/ML SOLN  COMPARISON:  None  FINDINGS: Severe diffuse atherosclerotic disease. There is atherosclerotic disease in the aortic arch. Three vessel aortic arch. The innominate artery has atherosclerotic disease without significant stenosis. There is a severe stenosis at the origin of the left common carotid artery narrowed by approximately 90% diameter stenosis. There is diffuse disease throughout the left common carotid artery. Atherosclerotic disease in the left proximal subclavian artery with calcified plaque. This narrows the lumen by approximately 75% diameter stenosis.  Right carotid artery: Common carotid artery is patent on the right. There is  complex calcified plaque at the right carotid bifurcation extending into the right internal and external carotid arteries. Multiple areas of stenosis in the internal carotid artery measuring up to 80% diameter stenosis. 50% diameter stenosis proximal right external carotid artery. Right internal carotid artery is patent to the skullbase.  Left carotid artery: 90% stenosis at the origin of the left common carotid artery. Diffuse disease in the common carotid artery which is small in caliber due to low flow. Calcified  and noncalcified plaque at the left carotid bifurcation narrowing the lumen by approximately 40% diameter stenosis. Small caliber left internal carotid artery due to proximal flow limiting stenosis. This causes thickening of the internal carotid artery wall. There is diffuse atherosclerotic disease in the cervical internal carotid artery.  Right vertebral: Dominant right vertebral artery which is widely patent without significant stenosis.  Left vertebral: Severely disease left vertebral artery with multiple areas of critical stenosis and occlusion and reconstitution. Distal left vertebral artery is patent and contributes to the basilar.  Review of the MIP images confirms the above findings.  IMPRESSION: Severe diffuse atherosclerotic disease.  80% diameter stenosis of the right internal carotid artery.  90% diameter stenosis at the origin of the left common carotid artery just above the aortic arch. There is diffuse disease in the left common carotid artery and left internal carotid artery. Calcified and noncalcified plaque of the carotid bifurcation with 40% diameter stenosis of the proximal left internal carotid artery.  Right vertebral artery widely patent  Small left vertebral artery which is diffusely diseased with multiple segmental areas of occlusion and reconstitution.   Electronically Signed   By: Marlan Palau M.D.   On: 06/24/2013 13:01    Scheduled Meds: . aspirin  325 mg Oral Daily  .  benazepril  10 mg Oral Daily  . clopidogrel  75 mg Oral Q breakfast  . enoxaparin (LOVENOX) injection  40 mg Subcutaneous Q24H  . insulin aspart  0-15 Units Subcutaneous TID WC  . insulin aspart  0-5 Units Subcutaneous QHS  . nicotine  21 mg Transdermal Daily  . pantoprazole  40 mg Oral BID   Continuous Infusions: . sodium chloride Stopped (06/23/13 1900)    Active Problems:   DM (diabetes mellitus)   Essential hypertension, benign   Other and unspecified hyperlipidemia   History of UTI   Dehydration   Nausea & vomiting   Gastroparesis   Nausea and vomiting   Carotid bruit   Chronic mesenteric ischemia  Time spent:  Sarah Alvarez K  [facility] Pager [phone]. If 7PM-7AM, please contact night-coverage at [email or web address], password [facility] 06/25/2013, 8:25 AM  LOS: 6 days

## 2013-06-29 ENCOUNTER — Other Ambulatory Visit: Payer: Self-pay

## 2013-07-06 ENCOUNTER — Telehealth: Payer: Self-pay | Admitting: *Deleted

## 2013-07-06 NOTE — Telephone Encounter (Signed)
Patient was told to stop her Plavix on 07-15-13 per Dr. Adele Dan message. ( Surgery is planned for 07-19-13).  Sarah Alvarez voiced understanding of this plan.

## 2013-07-09 ENCOUNTER — Encounter: Payer: Self-pay | Admitting: Internal Medicine

## 2013-07-09 ENCOUNTER — Ambulatory Visit: Payer: Medicaid Other | Attending: Internal Medicine | Admitting: Internal Medicine

## 2013-07-09 VITALS — BP 140/70 | HR 105 | Temp 98.6°F | Resp 16 | Ht 63.0 in | Wt 148.0 lb

## 2013-07-09 DIAGNOSIS — K551 Chronic vascular disorders of intestine: Secondary | ICD-10-CM

## 2013-07-09 DIAGNOSIS — R112 Nausea with vomiting, unspecified: Secondary | ICD-10-CM

## 2013-07-09 DIAGNOSIS — E860 Dehydration: Secondary | ICD-10-CM

## 2013-07-09 DIAGNOSIS — I10 Essential (primary) hypertension: Secondary | ICD-10-CM

## 2013-07-09 DIAGNOSIS — Z09 Encounter for follow-up examination after completed treatment for conditions other than malignant neoplasm: Secondary | ICD-10-CM

## 2013-07-09 DIAGNOSIS — E119 Type 2 diabetes mellitus without complications: Secondary | ICD-10-CM

## 2013-07-09 LAB — GLUCOSE, POCT (MANUAL RESULT ENTRY): POC Glucose: 150 mg/dl — AB (ref 70–99)

## 2013-07-09 NOTE — Patient Instructions (Signed)
2 Gram Low Sodium Diet A 2 gram sodium diet restricts the amount of sodium in the diet to no more than 2 g or 2000 mg daily. Limiting the amount of sodium is often used to help lower blood pressure. It is important if you have heart, liver, or kidney problems. Many foods contain sodium for flavor and sometimes as a preservative. When the amount of sodium in a diet needs to be low, it is important to know what to look for when choosing foods and drinks. The following includes some information and guidelines to help make it easier for you to adapt to a low sodium diet. QUICK TIPS  Do not add salt to food.  Avoid convenience items and fast food.  Choose unsalted snack foods.  Buy lower sodium products, often labeled as "lower sodium" or "no salt added."  Check food labels to learn how much sodium is in 1 serving.  When eating at a restaurant, ask that your food be prepared with less salt or none, if possible. READING FOOD LABELS FOR SODIUM INFORMATION The nutrition facts label is a good place to find how much sodium is in foods. Look for products with no more than 500 to 600 mg of sodium per meal and no more than 150 mg per serving. Remember that 2 g = 2000 mg. The food label may also list foods as:  Sodium-free: Less than 5 mg in a serving.  Very low sodium: 35 mg or less in a serving.  Low-sodium: 140 mg or less in a serving.  Light in sodium: 50% less sodium in a serving. For example, if a food that usually has 300 mg of sodium is changed to become light in sodium, it will have 150 mg of sodium.  Reduced sodium: 25% less sodium in a serving. For example, if a food that usually has 400 mg of sodium is changed to reduced sodium, it will have 300 mg of sodium. CHOOSING FOODS Grains  Avoid: Salted crackers and snack items. Some cereals, including instant hot cereals. Bread stuffing and biscuit mixes. Seasoned rice or pasta mixes.  Choose: Unsalted snack items. Low-sodium cereals, oats,  puffed wheat and rice, shredded wheat. English muffins and bread. Pasta. Meats  Avoid: Salted, canned, smoked, spiced, pickled meats, including fish and poultry. Bacon, ham, sausage, cold cuts, hot dogs, anchovies.  Choose: Low-sodium canned tuna and salmon. Fresh or frozen meat, poultry, and fish. Dairy  Avoid: Processed cheese and spreads. Cottage cheese. Buttermilk and condensed milk. Regular cheese.  Choose: Milk. Low-sodium cottage cheese. Yogurt. Sour cream. Low-sodium cheese. Fruits and Vegetables  Avoid: Regular canned vegetables. Regular canned tomato sauce and paste. Frozen vegetables in sauces. Olives. Pickles. Relishes. Sauerkraut.  Choose: Low-sodium canned vegetables. Low-sodium tomato sauce and paste. Frozen or fresh vegetables. Fresh and frozen fruit. Condiments  Avoid: Canned and packaged gravies. Worcestershire sauce. Tartar sauce. Barbecue sauce. Soy sauce. Steak sauce. Ketchup. Onion, garlic, and table salt. Meat flavorings and tenderizers.  Choose: Fresh and dried herbs and spices. Low-sodium varieties of mustard and ketchup. Lemon juice. Tabasco sauce. Horseradish. SAMPLE 2 GRAM SODIUM MEAL PLAN Breakfast / Sodium (mg)  1 cup low-fat milk / 143 mg  2 slices whole-wheat toast / 270 mg  1 tbs heart-healthy margarine / 153 mg  1 hard-boiled egg / 139 mg  1 small orange / 0 mg Lunch / Sodium (mg)  1 cup raw carrots / 76 mg   cup hummus / 298 mg  1 cup low-fat milk /   143 mg   cup red grapes / 2 mg  1 whole-wheat pita bread / 356 mg Dinner / Sodium (mg)  1 cup whole-wheat pasta / 2 mg  1 cup low-sodium tomato sauce / 73 mg  3 oz lean ground beef / 57 mg  1 small side salad (1 cup raw spinach leaves,  cup cucumber,  cup yellow bell pepper) with 1 tsp olive oil and 1 tsp red wine vinegar / 25 mg Snack / Sodium (mg)  1 container low-fat vanilla yogurt / 107 mg  3 graham cracker squares / 127 mg Nutrient Analysis  Calories: 2033  Protein:  77 g  Carbohydrate: 282 g  Fat: 72 g  Sodium: 1971 mg Document Released: 06/28/2005 Document Revised: 09/20/2011 Document Reviewed: 09/29/2009 [employer] Patient Information 2014 [employer].  

## 2013-07-09 NOTE — Progress Notes (Signed)
MRN: [id number] Name: Sarah Alvarez  Sex: female Age: 62 y.o. DOB: [birth year]  Allergies: Review of patient's allergies indicates no known allergies.  Chief Complaint  Patient presents with  . Hospitalization Follow-up    HPI: Patient is 62 y.o. female who  comes today for followup , she has history of diabetes hypertension hyperlipidemia, she was recently hospitalized with the symptoms of nausea vomiting and epigastric pain, EMR reviewed, patient was dehydrated, GI was on board, she had CT findings of diffuse stenosis including SMA in the symptoms thought to be secondary to mesenteric ischemia then vascular surgery was consulted patient had angiographic done and a stent placed, patient symptomatically improved she was also treated with IV fluids had hyponatremia which improved, she was started on aspirin and Plavix as well and is going to follow with vascular surgery since found to have right ICA stenosis 80% with neck Doppler. Patient has reason reported to have hypoglycemic episode last night, reviewed her medications patient is taking Lantus 60 units, previously she could not tolerate metformin that is why she was prescribed Glucotrol, patient reported she is taking both the medication now as metformin does not bother her much, I have advised patient to continue with Lantus and metformin discontinued Glucotrol. Patient denies any more nausea vomiting or any abdominal pain.  Past Medical History  Diagnosis Date  . Diabetes mellitus   . Staph infection     Past Surgical History  Procedure Laterality Date  . Cholecystectomy    . Abdominal hysterectomy        Medication List       This list is accurate as of: 07/09/13  5:20 PM.  Always use your most recent med list.               aspirin 325 MG tablet  Take 1 tablet (325 mg total) by mouth daily.     atorvastatin 20 MG tablet  Commonly known as:  LIPITOR  Take 1 tablet (20 mg total) by mouth daily.     benazepril 10 MG tablet   Commonly known as:  LOTENSIN  Take 1 tablet (10 mg total) by mouth daily.     clopidogrel 75 MG tablet  Commonly known as:  PLAVIX  Take 1 tablet (75 mg total) by mouth daily with breakfast.     HYDROcodone-acetaminophen 5-325 MG per tablet  Commonly known as:  NORCO/VICODIN  Take 2 tablets by mouth every 4 (four) hours as needed.     insulin glargine 100 UNIT/ML injection  Commonly known as:  LANTUS  Inject 0.6 mLs (60 Units total) into the skin at bedtime.     metFORMIN 500 MG (MOD) 24 hr tablet  Commonly known as:  GLUMETZA  Take 500 mg by mouth 2 (two) times daily with a meal.     nicotine 21 mg/24hr patch  Commonly known as:  NICODERM CQ  Place 1 patch (21 mg total) onto the skin daily.     promethazine 25 MG tablet  Commonly known as:  PHENERGAN  Take 1 tablet (25 mg total) by mouth every 6 (six) hours as needed for nausea or vomiting.     SALONPAS ARTHRITIS PAIN RELIEF Pads  Apply 1 each topically at bedtime as needed (pain on hand).        No orders of the defined types were placed in this encounter.    Immunization History  Administered Date(s) Administered  . Influenza Split 06/05/2012  . Influenza,inj,Quad PF,36+ Mos 06/12/2013, 06/21/2013  .  Pneumococcal Polysaccharide-23 06/21/2013    Family History  Problem Relation Age of Onset  . Diabetes Mother   . Cancer Father   . Diabetes Sister   . Hypertension Sister   . Cancer Brother   . Cancer Maternal Aunt     breast cancer   . Diabetes Maternal Grandmother   . Diabetes Paternal Grandmother     History  Substance Use Topics  . Smoking status: Former Smoker -- 1.00 packs/day for 20 years    Quit date: 06/21/2013  . Smokeless tobacco: Never Used  . Alcohol Use: No    Review of Systems  As noted in HPI  Filed Vitals:   07/09/13 1715  BP: 140/70  Pulse:   Temp:   Resp:     Physical Exam  Physical Exam  Constitutional: No distress.  Eyes: EOM are normal. Pupils are equal, round,  and reactive to light.  Neck:  Right carotid bruit  Cardiovascular: Normal rate and regular rhythm.   Pulmonary/Chest: No respiratory distress. She has no wheezes. She has no rales.  Abdominal: Soft. There is no tenderness. There is no rebound.  Musculoskeletal: She exhibits no edema.    CBC    Component Value Date/Time   WBC 10.9* 06/20/2013 0435   RBC 4.01 06/20/2013 0435   HGB 12.9 06/20/2013 0435   HCT 37.6 06/20/2013 0435   PLT 273 06/20/2013 0435   MCV 93.8 06/20/2013 0435   LYMPHSABS 3.4 06/19/2013 1412   MONOABS 1.1* 06/19/2013 1412   EOSABS 0.1 06/19/2013 1412   BASOSABS 0.0 06/19/2013 1412    CMP     Component Value Date/Time   NA 136 06/24/2013 0915   K 3.7 06/24/2013 0915   CL 101 06/24/2013 0915   CO2 26 06/24/2013 0915   GLUCOSE 224* 06/24/2013 0915   BUN 6 06/24/2013 0915   CREATININE 0.45* 06/24/2013 0915   CALCIUM 8.5 06/24/2013 0915   PROT 6.0 06/20/2013 0435   ALBUMIN 3.1* 06/20/2013 0435   AST 14 06/20/2013 0435   ALT 6 06/20/2013 0435   ALKPHOS 92 06/20/2013 0435   BILITOT 0.5 06/20/2013 0435   GFRNONAA >90 06/24/2013 0915   GFRAA >90 06/24/2013 0915    Lab Results  Component Value Date/Time   CHOL 164 08/07/2009  9:33 PM    No components found with this basename: hga1c    Lab Results  Component Value Date/Time   AST 14 06/20/2013  4:35 AM    Assessment and Plan  Follow up  Essential hypertension, benign The manual is 140/70 continue with Lotensin 10 mg, also advise for low salt diet  Nausea & vomiting/Dehydration Now improved.  Chronic mesenteric ischemia Status post stenting on aspirin and Plavix continue to follow with vascular  Diabetes - Plan: Glucose (CBG) Patient will continued Lantus 60 units each bedtime and metformin 500 mg twice a day discontinue Glucotrol.  Patient already quit smoking.  Return in about 3 months (around 10/07/2013).  Doris Cheadle, MD

## 2013-07-09 NOTE — Progress Notes (Signed)
HFU Pt was admitted to the hospital with diarrhea and dehydration. Today pt is feeling much better with no symptoms.

## 2013-07-10 ENCOUNTER — Encounter (HOSPITAL_COMMUNITY): Payer: Self-pay

## 2013-07-12 HISTORY — PX: FINGER FRACTURE SURGERY: SHX638

## 2013-07-16 ENCOUNTER — Other Ambulatory Visit: Payer: Self-pay

## 2013-07-16 MED ORDER — METFORMIN HCL ER (MOD) 500 MG PO TB24: 500.0000 mg | ORAL_TABLET | Freq: Two times a day (BID) | ORAL | Status: DC

## 2013-07-17 ENCOUNTER — Encounter (HOSPITAL_COMMUNITY)
Admission: RE | Admit: 2013-07-17 | Discharge: 2013-07-17 | Disposition: A | Discharge status: Home / Self Care | Payer: No Typology Code available for payment source | Source: Ambulatory Visit | Attending: Vascular Surgery | Admitting: Vascular Surgery

## 2013-07-17 ENCOUNTER — Encounter (HOSPITAL_COMMUNITY): Payer: Self-pay

## 2013-07-17 DIAGNOSIS — Z01810 Encounter for preprocedural cardiovascular examination: Secondary | ICD-10-CM | POA: Insufficient documentation

## 2013-07-17 DIAGNOSIS — Z01818 Encounter for other preprocedural examination: Secondary | ICD-10-CM | POA: Insufficient documentation

## 2013-07-17 DIAGNOSIS — Z01811 Encounter for preprocedural respiratory examination: Secondary | ICD-10-CM | POA: Insufficient documentation

## 2013-07-17 DIAGNOSIS — Z01812 Encounter for preprocedural laboratory examination: Secondary | ICD-10-CM | POA: Insufficient documentation

## 2013-07-17 HISTORY — DX: Atherosclerotic heart disease of native coronary artery without angina pectoris: I25.10

## 2013-07-17 HISTORY — DX: Restless legs syndrome: G25.81

## 2013-07-17 HISTORY — DX: Chronic vascular disorders of intestine: K55.1

## 2013-07-17 LAB — COMPREHENSIVE METABOLIC PANEL
ALT: 11 U/L (ref 0–35)
AST: 14 U/L (ref 0–37)
Albumin: 3.7 g/dL (ref 3.5–5.2)
Alkaline Phosphatase: 85 U/L (ref 39–117)
BUN: 16 mg/dL (ref 6–23)
CALCIUM: 9.2 mg/dL (ref 8.4–10.5)
CO2: 28 meq/L (ref 19–32)
CREATININE: 0.55 mg/dL (ref 0.50–1.10)
Chloride: 104 mEq/L (ref 96–112)
GFR calc Af Amer: >90 mL/min (ref >90)
GLUCOSE: 164 mg/dL — AB (ref 70–99)
Potassium: 4.6 mEq/L (ref 3.7–5.3)
Sodium: 143 mEq/L (ref 137–147)
Total Protein: 6.6 g/dL (ref 6.0–8.3)

## 2013-07-17 LAB — CBC
HEMATOCRIT: 35.8 % — AB (ref 36.0–46.0)
HEMOGLOBIN: 12.2 g/dL (ref 12.0–15.0)
MCH: 32.7 pg (ref 26.0–34.0)
MCHC: 34.1 g/dL (ref 30.0–36.0)
MCV: 96 fL (ref 78.0–100.0)
Platelets: 269 10*3/uL (ref 150–400)
RBC: 3.73 MIL/uL — ABNORMAL LOW (ref 3.87–5.11)
RDW: 13.9 % (ref 11.5–15.5)
WBC: 8.7 10*3/uL (ref 4.0–10.5)

## 2013-07-17 LAB — URINALYSIS, ROUTINE W REFLEX MICROSCOPIC
Bilirubin Urine: NEGATIVE
GLUCOSE, UA: 250 mg/dL — AB
Hgb urine dipstick: NEGATIVE
Ketones, ur: NEGATIVE mg/dL
LEUKOCYTES UA: NEGATIVE
Nitrite: NEGATIVE
Protein, ur: NEGATIVE mg/dL
Specific Gravity, Urine: 1.018 (ref 1.005–1.030)
Urobilinogen, UA: 0.2 mg/dL (ref 0.0–1.0)
pH: 5 (ref 5.0–8.0)

## 2013-07-17 LAB — TYPE AND SCREEN
ABO/RH(D): O POS
Antibody Screen: NEGATIVE

## 2013-07-17 LAB — ABO/RH: ABO/RH(D): O POS

## 2013-07-17 LAB — PROTIME-INR
INR: 0.96 (ref 0.00–1.49)
Prothrombin Time: 12.6 seconds (ref 11.6–15.2)

## 2013-07-17 LAB — SURGICAL PCR SCREEN
MRSA, PCR: NEGATIVE
Staphylococcus aureus: NEGATIVE

## 2013-07-17 LAB — APTT: aPTT: 28 seconds (ref 24–37)

## 2013-07-17 NOTE — Progress Notes (Addendum)
SPOKE WITH CAROL AT DR. DICKSON'S OFFICE WHO STATED PATIENT TO CONTINUE ASPIRIN .  PATIENT ALSO STATED SHE WAS TOLD BY OFFICE TO STOP PLAVIX.  PATIENT HAS GEN BRUISED ON ARMS ANS COMPLAINS OF ITCHING X 2 DAYS. SUGGESTED PATIENT CALL MEDICAL MD, PATIENT DENIES ANY RASH.  PATIENT STATES SHE DOES NOT SEE CARDIOLOGIST  ONLY HAS PCP. ([place])

## 2013-07-17 NOTE — Pre-Procedure Instructions (Signed)
Sarah Alvarez  07/17/2013   Your procedure is scheduled on:    Thursday  07/19/13  Report to [facility]  2 * 3 at 530 AM.  Call this number if you have problems the morning of surgery: [phone]   Remember:   Do not eat food or drink liquids after midnight.   Take these medicines the morning of surgery with A SIP OF WATER:   TYLENOL IF NEEDED   Do not wear jewelry, make-up or nail polish.  Do not wear lotions, powders, or perfumes. You may wear deodorant.  Do not shave 48 hours prior to surgery. Men may shave face and neck.  Do not bring valuables to the hospital.  [facility] is not responsible                  for any belongings or valuables.               Contacts, dentures or bridgework may not be worn into surgery.  Leave suitcase in the car. After surgery it may be brought to your room.  For patients admitted to the hospital, discharge time is determined by your                treatment team.               Patients discharged the day of surgery will not be allowed to drive  home.  Name and phone number of your driver:  Special Instructions: Shower using CHG 2 nights before surgery and the night before surgery.  If you shower the day of surgery use CHG.  Use special wash - you have one bottle of CHG for all showers.  You should use approximately 1/3 of the bottle for each shower.   Please read over the following fact sheets that you were given: Pain Booklet, Coughing and Deep Breathing, Blood Transfusion Information, MRSA Information and Surgical Site Infection Prevention

## 2013-07-18 ENCOUNTER — Encounter (HOSPITAL_COMMUNITY): Payer: Self-pay

## 2013-07-18 ENCOUNTER — Other Ambulatory Visit: Payer: Self-pay

## 2013-07-18 MED ORDER — CEFUROXIME SODIUM 1.5 G IJ SOLR
1.5000 g | INTRAMUSCULAR | Status: AC
  Administered 2013-07-19: 1.5 g via INTRAVENOUS
  Filled 2013-07-18: qty 1.5

## 2013-07-18 MED ORDER — "INSULIN SYRINGE 28G X 1/2"" 1 ML MISC": 60.0000 [IU] | Freq: Every evening | Status: DC

## 2013-07-18 NOTE — Progress Notes (Signed)
Anesthesia chart review:  Patient is a 63 year old female scheduled for right CEA on 07/19/13 by Dr. Scot Dock. History includes carotid occlusive disease, recent former smoker, RLS, DM2, chronic mesenteric ischemia s/p SMA stent 06/22/13, CAD s/p LCX stent 09/17/99 [facility]) reportedly for re-instent stenosis following ?LCX stent in [place], [place] in 1999 or 2000.  She was having chest/jaw pain then even after her stents, but was ultimately found to have gall bladder disease and her symptoms resolved following cholecystectomy.  She denied chest pain, SOB, LE edema (unless with gout exacerbation).  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  EKG on 07/17/13 showed NSR, anteroseptal infarct (age undetermined).  Echo of 02/20/09 showed: 1. Left ventricle: Wall thickness was increased increased in a pattern of mild to moderate LVH. Systolic function was normal. The estimated ejection fraction was in the range of 55% to 60%. Wall motion was normal; there were no regional wall motion abnormalities. Doppler parameters are consistent with abnormal left ventricular relaxation (grade 1 diastolic dysfunction). 2. Aortic valve: Valve area: 2.34cm^2(VTI). Valve area: 2.2cm^2 (Vmax).  Carotid duplex on 06/22/13 showed: Right: >80% ICA stenosis. ICA/CCA ratio is 3.81. Severe ECA stenosis. Left: 1-39% ICA stenosis. ICA/CCA ratio is 1.16. Bilateral: Vertebral artery flow is antegrade.  CXR on 07/17/13 showed no acute cardiopulmonary disease.  Preoperative labs noted.    Chart reviewed with anesthesiologist Dr. Conrad Woodstock.  Plan to evaluate on the day of surgery.  If she remains asymptomatic from a CV standpoint then it is anticipated that she can proceed as planned.  George Hugh [facility] Short Stay Center/Anesthesiology Phone [phone] 07/18/2013 1:33 PM

## 2013-07-19 ENCOUNTER — Encounter (HOSPITAL_COMMUNITY): Payer: Self-pay

## 2013-07-19 ENCOUNTER — Inpatient Hospital Stay (HOSPITAL_COMMUNITY)
Admission: RE | Admit: 2013-07-19 | Discharge: 2013-07-21 | DRG: 038 | Disposition: A | Discharge status: Home / Self Care | Payer: Medicaid Other | Source: Ambulatory Visit | Attending: Vascular Surgery | Admitting: Vascular Surgery

## 2013-07-19 ENCOUNTER — Encounter (HOSPITAL_COMMUNITY)
Admission: RE | Disposition: A | Discharge status: Home / Self Care | Payer: Self-pay | Source: Ambulatory Visit | Attending: Vascular Surgery

## 2013-07-19 ENCOUNTER — Inpatient Hospital Stay (HOSPITAL_COMMUNITY): Payer: Medicaid Other | Admitting: Anesthesiology

## 2013-07-19 ENCOUNTER — Other Ambulatory Visit: Payer: Self-pay

## 2013-07-19 ENCOUNTER — Encounter (HOSPITAL_COMMUNITY): Payer: Medicaid Other | Admitting: Vascular Surgery

## 2013-07-19 ENCOUNTER — Telehealth: Payer: Self-pay | Admitting: Vascular Surgery

## 2013-07-19 ENCOUNTER — Inpatient Hospital Stay (HOSPITAL_COMMUNITY): Payer: Medicaid Other

## 2013-07-19 DIAGNOSIS — E860 Dehydration: Secondary | ICD-10-CM | POA: Diagnosis present

## 2013-07-19 DIAGNOSIS — K3184 Gastroparesis: Secondary | ICD-10-CM | POA: Diagnosis present

## 2013-07-19 DIAGNOSIS — E1151 Type 2 diabetes mellitus with diabetic peripheral angiopathy without gangrene: Secondary | ICD-10-CM | POA: Diagnosis present

## 2013-07-19 DIAGNOSIS — IMO0002 Reserved for concepts with insufficient information to code with codable children: Secondary | ICD-10-CM | POA: Diagnosis present

## 2013-07-19 DIAGNOSIS — E861 Hypovolemia: Secondary | ICD-10-CM | POA: Diagnosis not present

## 2013-07-19 DIAGNOSIS — E119 Type 2 diabetes mellitus without complications: Secondary | ICD-10-CM

## 2013-07-19 DIAGNOSIS — E785 Hyperlipidemia, unspecified: Secondary | ICD-10-CM | POA: Diagnosis present

## 2013-07-19 DIAGNOSIS — I6529 Occlusion and stenosis of unspecified carotid artery: Principal | ICD-10-CM | POA: Diagnosis present

## 2013-07-19 DIAGNOSIS — Z79899 Other long term (current) drug therapy: Secondary | ICD-10-CM

## 2013-07-19 DIAGNOSIS — R579 Shock, unspecified: Secondary | ICD-10-CM | POA: Diagnosis not present

## 2013-07-19 DIAGNOSIS — K551 Chronic vascular disorders of intestine: Secondary | ICD-10-CM | POA: Diagnosis present

## 2013-07-19 DIAGNOSIS — I10 Essential (primary) hypertension: Secondary | ICD-10-CM | POA: Diagnosis present

## 2013-07-19 DIAGNOSIS — R112 Nausea with vomiting, unspecified: Secondary | ICD-10-CM | POA: Diagnosis present

## 2013-07-19 DIAGNOSIS — Z87891 Personal history of nicotine dependence: Secondary | ICD-10-CM

## 2013-07-19 DIAGNOSIS — J9819 Other pulmonary collapse: Secondary | ICD-10-CM | POA: Diagnosis not present

## 2013-07-19 DIAGNOSIS — E1169 Type 2 diabetes mellitus with other specified complication: Secondary | ICD-10-CM | POA: Diagnosis present

## 2013-07-19 DIAGNOSIS — E1165 Type 2 diabetes mellitus with hyperglycemia: Secondary | ICD-10-CM

## 2013-07-19 DIAGNOSIS — Z7982 Long term (current) use of aspirin: Secondary | ICD-10-CM

## 2013-07-19 DIAGNOSIS — D649 Anemia, unspecified: Secondary | ICD-10-CM | POA: Diagnosis not present

## 2013-07-19 DIAGNOSIS — I2510 Atherosclerotic heart disease of native coronary artery without angina pectoris: Secondary | ICD-10-CM | POA: Diagnosis present

## 2013-07-19 HISTORY — PX: ENDARTERECTOMY: SHX5162

## 2013-07-19 LAB — CBC
HCT: 30.5 % — ABNORMAL LOW (ref 36.0–46.0)
Hemoglobin: 10.4 g/dL — ABNORMAL LOW (ref 12.0–15.0)
MCH: 32.8 pg (ref 26.0–34.0)
MCHC: 34.1 g/dL (ref 30.0–36.0)
MCV: 96.2 fL (ref 78.0–100.0)
PLATELETS: 226 10*3/uL (ref 150–400)
RBC: 3.17 MIL/uL — ABNORMAL LOW (ref 3.87–5.11)
RDW: 13.7 % (ref 11.5–15.5)
WBC: 9.9 10*3/uL (ref 4.0–10.5)

## 2013-07-19 LAB — TROPONIN I
Troponin I: <0.3 ng/mL (ref <0.30)
Troponin I: <0.3 ng/mL (ref <0.30)

## 2013-07-19 LAB — GLUCOSE, CAPILLARY
GLUCOSE-CAPILLARY: 93 mg/dL (ref 70–99)
GLUCOSE-CAPILLARY: 103 mg/dL — AB (ref 70–99)
GLUCOSE-CAPILLARY: 44 mg/dL — AB (ref 70–99)
GLUCOSE-CAPILLARY: 118 mg/dL — AB (ref 70–99)
GLUCOSE-CAPILLARY: 89 mg/dL (ref 70–99)
GLUCOSE-CAPILLARY: 46 mg/dL — AB (ref 70–99)
Glucose-Capillary: 66 mg/dL — ABNORMAL LOW (ref 70–99)
Glucose-Capillary: 72 mg/dL (ref 70–99)
Glucose-Capillary: 89 mg/dL (ref 70–99)
Glucose-Capillary: 127 mg/dL — ABNORMAL HIGH (ref 70–99)
Glucose-Capillary: 65 mg/dL — ABNORMAL LOW (ref 70–99)
Glucose-Capillary: 105 mg/dL — ABNORMAL HIGH (ref 70–99)
Glucose-Capillary: 58 mg/dL — ABNORMAL LOW (ref 70–99)
Glucose-Capillary: 46 mg/dL — ABNORMAL LOW (ref 70–99)

## 2013-07-19 LAB — CREATININE, SERUM
Creatinine, Ser: 0.55 mg/dL (ref 0.50–1.10)
GFR calc Af Amer: >90 mL/min (ref >90)
GFR calc non Af Amer: >90 mL/min (ref >90)

## 2013-07-19 LAB — LACTIC ACID, PLASMA: Lactic Acid, Venous: 0.3 mmol/L — ABNORMAL LOW (ref 0.5–2.2)

## 2013-07-19 LAB — HEMOGLOBIN A1C
Hgb A1c MFr Bld: 7.3 % — ABNORMAL HIGH (ref <5.7)
Mean Plasma Glucose: 163 mg/dL — ABNORMAL HIGH (ref <117)

## 2013-07-19 SURGERY — ENDARTERECTOMY, CAROTID
Anesthesia: General | Site: Neck | Laterality: Right

## 2013-07-19 MED ORDER — SODIUM CHLORIDE 0.9 % IV SOLN: INTRAVENOUS | Status: DC

## 2013-07-19 MED ORDER — ONDANSETRON HCL 4 MG/2ML IJ SOLN
4.0000 mg | Freq: Once | INTRAMUSCULAR | Status: AC | PRN
  Administered 2013-07-19: 4 mg via INTRAVENOUS

## 2013-07-19 MED ORDER — OXYCODONE HCL 5 MG PO TABS: 5.0000 mg | ORAL_TABLET | Freq: Once | ORAL | Status: DC | PRN

## 2013-07-19 MED ORDER — KCL IN DEXTROSE-NACL 20-5-0.45 MEQ/L-%-% IV SOLN
INTRAVENOUS | Status: AC
  Filled 2013-07-19: qty 1000

## 2013-07-19 MED ORDER — ENOXAPARIN SODIUM 30 MG/0.3ML ~~LOC~~ SOLN
30.0000 mg | SUBCUTANEOUS | Status: DC
  Administered 2013-07-20 – 2013-07-21 (×2): 30 mg via SUBCUTANEOUS
  Filled 2013-07-19 (×2): qty 0.3

## 2013-07-19 MED ORDER — METFORMIN HCL ER 500 MG PO TB24
500.0000 mg | ORAL_TABLET | Freq: Two times a day (BID) | ORAL | Status: DC
  Filled 2013-07-19 (×2): qty 1

## 2013-07-19 MED ORDER — PROPOFOL 10 MG/ML IV BOLUS
INTRAVENOUS | Status: DC | PRN
  Administered 2013-07-19: 110 mg via INTRAVENOUS

## 2013-07-19 MED ORDER — ACETAMINOPHEN 650 MG RE SUPP: 325.0000 mg | RECTAL | Status: DC | PRN

## 2013-07-19 MED ORDER — DEXTROSE 50 % IV SOLN
12.5000 g | Freq: Once | INTRAVENOUS | Status: AC
  Administered 2013-07-19: 12.5 g via INTRAVENOUS

## 2013-07-19 MED ORDER — MIDAZOLAM HCL 5 MG/5ML IJ SOLN
INTRAMUSCULAR | Status: DC | PRN
  Administered 2013-07-19: 1 mg via INTRAVENOUS

## 2013-07-19 MED ORDER — HYDROMORPHONE HCL PF 1 MG/ML IJ SOLN
INTRAMUSCULAR | Status: AC
  Administered 2013-07-19: 0.5 mg via INTRAVENOUS
  Filled 2013-07-19: qty 1

## 2013-07-19 MED ORDER — HYDRALAZINE HCL 20 MG/ML IJ SOLN: 10.0000 mg | INTRAMUSCULAR | Status: DC | PRN

## 2013-07-19 MED ORDER — OXYCODONE-ACETAMINOPHEN 5-325 MG PO TABS
1.0000 | ORAL_TABLET | ORAL | Status: DC | PRN
  Administered 2013-07-19 – 2013-07-20 (×5): 1 via ORAL
  Administered 2013-07-20: 2 via ORAL
  Administered 2013-07-21: 1 via ORAL
  Filled 2013-07-19 (×4): qty 1
  Filled 2013-07-19: qty 2
  Filled 2013-07-19 (×2): qty 1

## 2013-07-19 MED ORDER — LIDOCAINE-EPINEPHRINE 0.5 %-1:200000 IJ SOLN
INTRAMUSCULAR | Status: AC
  Filled 2013-07-19: qty 1

## 2013-07-19 MED ORDER — MAGNESIUM SULFATE 40 MG/ML IJ SOLN
2.0000 g | Freq: Every day | INTRAMUSCULAR | Status: DC | PRN
  Filled 2013-07-19: qty 50

## 2013-07-19 MED ORDER — BENAZEPRIL HCL 10 MG PO TABS
10.0000 mg | ORAL_TABLET | Freq: Every day | ORAL | Status: DC
  Filled 2013-07-19: qty 1

## 2013-07-19 MED ORDER — MEPERIDINE HCL 25 MG/ML IJ SOLN: 6.2500 mg | INTRAMUSCULAR | Status: DC | PRN

## 2013-07-19 MED ORDER — ROCURONIUM BROMIDE 100 MG/10ML IV SOLN
INTRAVENOUS | Status: DC | PRN
  Administered 2013-07-19: 50 mg via INTRAVENOUS

## 2013-07-19 MED ORDER — HYDROMORPHONE HCL PF 1 MG/ML IJ SOLN
0.2500 mg | INTRAMUSCULAR | Status: DC | PRN
  Administered 2013-07-19 (×2): 0.5 mg via INTRAVENOUS

## 2013-07-19 MED ORDER — SODIUM CHLORIDE 0.9 % IV SOLN
500.0000 mL | Freq: Once | INTRAVENOUS | Status: AC | PRN
  Administered 2013-07-19: 500 mL via INTRAVENOUS

## 2013-07-19 MED ORDER — INSULIN ASPART 100 UNIT/ML ~~LOC~~ SOLN: 0.0000 [IU] | Freq: Three times a day (TID) | SUBCUTANEOUS | Status: DC

## 2013-07-19 MED ORDER — SODIUM CHLORIDE 0.9 % IR SOLN
Status: DC | PRN
  Administered 2013-07-19: 09:00:00

## 2013-07-19 MED ORDER — DEXTRAN 40 IN SALINE 10-0.9 % IV SOLN
INTRAVENOUS | Status: AC
  Filled 2013-07-19: qty 500

## 2013-07-19 MED ORDER — FENTANYL CITRATE 0.05 MG/ML IJ SOLN
INTRAMUSCULAR | Status: DC | PRN
  Administered 2013-07-19: 150 ug via INTRAVENOUS

## 2013-07-19 MED ORDER — DEXTROSE 50 % IV SOLN
12.5000 g | INTRAVENOUS | Status: AC | PRN
  Administered 2013-07-19 (×2): 12.5 g via INTRAVENOUS

## 2013-07-19 MED ORDER — KCL IN DEXTROSE-NACL 20-5-0.45 MEQ/L-%-% IV SOLN
INTRAVENOUS | Status: DC
  Administered 2013-07-19: 14:00:00 via INTRAVENOUS
  Filled 2013-07-19 (×2): qty 1000

## 2013-07-19 MED ORDER — DEXTRAN 40 IN SALINE 10-0.9 % IV SOLN
INTRAVENOUS | Status: DC | PRN
  Administered 2013-07-19: 657 mL

## 2013-07-19 MED ORDER — DOPAMINE-DEXTROSE 3.2-5 MG/ML-% IV SOLN
INTRAVENOUS | Status: AC
  Filled 2013-07-19: qty 250

## 2013-07-19 MED ORDER — "INSULIN SYRINGE 28G X 1/2"" 1 ML MISC": 60.0000 [IU] | Freq: Every evening | Status: DC

## 2013-07-19 MED ORDER — NEOSTIGMINE METHYLSULFATE 1 MG/ML IJ SOLN
INTRAMUSCULAR | Status: DC | PRN
  Administered 2013-07-19: 4 mg via INTRAVENOUS

## 2013-07-19 MED ORDER — LIDOCAINE-EPINEPHRINE (PF) 1 %-1:200000 IJ SOLN
INTRAMUSCULAR | Status: DC | PRN
  Administered 2013-07-19: 10 mL

## 2013-07-19 MED ORDER — PANTOPRAZOLE SODIUM 40 MG PO TBEC
40.0000 mg | DELAYED_RELEASE_TABLET | Freq: Every day | ORAL | Status: DC
  Administered 2013-07-20 – 2013-07-21 (×2): 40 mg via ORAL
  Filled 2013-07-19 (×2): qty 1

## 2013-07-19 MED ORDER — DOPAMINE-DEXTROSE 3.2-5 MG/ML-% IV SOLN: 3.0000 ug/kg/min | INTRAVENOUS | Status: DC

## 2013-07-19 MED ORDER — GLYCOPYRROLATE 0.2 MG/ML IJ SOLN
INTRAMUSCULAR | Status: DC | PRN
  Administered 2013-07-19: .6 mg via INTRAVENOUS

## 2013-07-19 MED ORDER — ALUM & MAG HYDROXIDE-SIMETH 200-200-20 MG/5ML PO SUSP: 15.0000 mL | ORAL | Status: DC | PRN

## 2013-07-19 MED ORDER — SENNOSIDES-DOCUSATE SODIUM 8.6-50 MG PO TABS
1.0000 | ORAL_TABLET | Freq: Every evening | ORAL | Status: DC | PRN
  Filled 2013-07-19: qty 1

## 2013-07-19 MED ORDER — DOCUSATE SODIUM 100 MG PO CAPS
100.0000 mg | ORAL_CAPSULE | Freq: Every day | ORAL | Status: DC
  Administered 2013-07-20 – 2013-07-21 (×2): 100 mg via ORAL
  Filled 2013-07-19 (×2): qty 1

## 2013-07-19 MED ORDER — DEXTROSE 5 % IV SOLN
1.5000 g | Freq: Two times a day (BID) | INTRAVENOUS | Status: AC
  Administered 2013-07-19 – 2013-07-20 (×2): 1.5 g via INTRAVENOUS
  Filled 2013-07-19 (×3): qty 1.5

## 2013-07-19 MED ORDER — LIDOCAINE HCL (CARDIAC) 20 MG/ML IV SOLN
INTRAVENOUS | Status: DC | PRN
  Administered 2013-07-19: 100 mg via INTRAVENOUS

## 2013-07-19 MED ORDER — ACETAMINOPHEN 325 MG PO TABS: 325.0000 mg | ORAL_TABLET | ORAL | Status: DC | PRN

## 2013-07-19 MED ORDER — OXYCODONE HCL 5 MG/5ML PO SOLN: 5.0000 mg | Freq: Once | ORAL | Status: DC | PRN

## 2013-07-19 MED ORDER — LACTATED RINGERS IV SOLN
INTRAVENOUS | Status: DC | PRN
  Administered 2013-07-19 (×2): via INTRAVENOUS

## 2013-07-19 MED ORDER — THROMBIN 20000 UNITS EX SOLR
CUTANEOUS | Status: AC
  Filled 2013-07-19: qty 20000

## 2013-07-19 MED ORDER — LABETALOL HCL 5 MG/ML IV SOLN: 10.0000 mg | INTRAVENOUS | Status: DC | PRN

## 2013-07-19 MED ORDER — PHENYLEPHRINE HCL 10 MG/ML IJ SOLN
10.0000 mg | INTRAVENOUS | Status: DC | PRN
  Administered 2013-07-19: 25 ug/min via INTRAVENOUS

## 2013-07-19 MED ORDER — DEXTROSE 50 % IV SOLN
INTRAVENOUS | Status: AC
  Administered 2013-07-19: 11:00:00 12.5 g via INTRAVENOUS
  Filled 2013-07-19: qty 50

## 2013-07-19 MED ORDER — INSULIN GLARGINE 100 UNIT/ML ~~LOC~~ SOLN
60.0000 [IU] | Freq: Every day | SUBCUTANEOUS | Status: DC
  Filled 2013-07-19: qty 0.6

## 2013-07-19 MED ORDER — SODIUM CHLORIDE 0.9 % IV SOLN
INTRAVENOUS | Status: AC
  Administered 2013-07-19: 18:00:00 via INTRAVENOUS

## 2013-07-19 MED ORDER — ONDANSETRON HCL 4 MG/2ML IJ SOLN
INTRAMUSCULAR | Status: DC | PRN
  Administered 2013-07-19: 4 mg via INTRAVENOUS

## 2013-07-19 MED ORDER — DEXTROSE-NACL 5-0.45 % IV SOLN
INTRAVENOUS | Status: DC
  Administered 2013-07-19: 23:00:00 via INTRAVENOUS

## 2013-07-19 MED ORDER — DEXTROSE 50 % IV SOLN
INTRAVENOUS | Status: AC
  Filled 2013-07-19: qty 50

## 2013-07-19 MED ORDER — BISACODYL 10 MG RE SUPP: 10.0000 mg | Freq: Every day | RECTAL | Status: DC | PRN

## 2013-07-19 MED ORDER — ONDANSETRON HCL 4 MG/2ML IJ SOLN: 4.0000 mg | Freq: Four times a day (QID) | INTRAMUSCULAR | Status: DC | PRN

## 2013-07-19 MED ORDER — GUAIFENESIN-DM 100-10 MG/5ML PO SYRP: 15.0000 mL | ORAL_SOLUTION | ORAL | Status: DC | PRN

## 2013-07-19 MED ORDER — DEXTROSE 50 % IV SOLN
25.0000 mL | Freq: Once | INTRAVENOUS | Status: AC | PRN
  Administered 2013-07-19: 25 mL via INTRAVENOUS

## 2013-07-19 MED ORDER — LIDOCAINE HCL (PF) 1 % IJ SOLN: INTRAMUSCULAR | Status: DC | PRN

## 2013-07-19 MED ORDER — INSULIN ASPART 100 UNIT/ML ~~LOC~~ SOLN
0.0000 [IU] | SUBCUTANEOUS | Status: DC
Start: 2013-07-19 — End: 2013-07-20
  Administered 2013-07-20 (×2): 3 [IU] via SUBCUTANEOUS

## 2013-07-19 MED ORDER — POTASSIUM CHLORIDE CRYS ER 20 MEQ PO TBCR: 20.0000 meq | EXTENDED_RELEASE_TABLET | Freq: Every day | ORAL | Status: DC | PRN

## 2013-07-19 MED ORDER — 0.9 % SODIUM CHLORIDE (POUR BTL) OPTIME
TOPICAL | Status: DC | PRN
  Administered 2013-07-19: 1000 mL

## 2013-07-19 MED ORDER — ONDANSETRON HCL 4 MG/2ML IJ SOLN
INTRAMUSCULAR | Status: AC
  Filled 2013-07-19: qty 2

## 2013-07-19 MED ORDER — METOPROLOL TARTRATE 1 MG/ML IV SOLN: 2.0000 mg | INTRAVENOUS | Status: DC | PRN

## 2013-07-19 MED ORDER — LIDOCAINE HCL (PF) 1 % IJ SOLN
INTRAMUSCULAR | Status: AC
  Filled 2013-07-19: qty 30

## 2013-07-19 MED ORDER — CLOPIDOGREL BISULFATE 75 MG PO TABS
75.0000 mg | ORAL_TABLET | Freq: Every day | ORAL | Status: DC
  Administered 2013-07-20 – 2013-07-21 (×2): 75 mg via ORAL
  Filled 2013-07-19 (×3): qty 1

## 2013-07-19 MED ORDER — ATORVASTATIN CALCIUM 20 MG PO TABS
20.0000 mg | ORAL_TABLET | Freq: Every day | ORAL | Status: DC
  Administered 2013-07-20 – 2013-07-21 (×2): 20 mg via ORAL
  Filled 2013-07-19 (×3): qty 1

## 2013-07-19 MED ORDER — MORPHINE SULFATE 2 MG/ML IJ SOLN
2.0000 mg | INTRAMUSCULAR | Status: DC | PRN
  Administered 2013-07-19: 2 mg via INTRAVENOUS
  Filled 2013-07-19: qty 1

## 2013-07-19 MED ORDER — PHENOL 1.4 % MT LIQD: 1.0000 | OROMUCOSAL | Status: DC | PRN

## 2013-07-19 MED ORDER — PHENYLEPHRINE HCL 10 MG/ML IJ SOLN
INTRAMUSCULAR | Status: DC | PRN
  Administered 2013-07-19 (×2): 200 ug via INTRAVENOUS

## 2013-07-19 MED ORDER — HEPARIN SODIUM (PORCINE) 1000 UNIT/ML IJ SOLN
INTRAMUSCULAR | Status: DC | PRN
  Administered 2013-07-19: 6000 [IU] via INTRAVENOUS

## 2013-07-19 MED ORDER — ASPIRIN 325 MG PO TABS
325.0000 mg | ORAL_TABLET | Freq: Every day | ORAL | Status: DC
  Administered 2013-07-20 – 2013-07-21 (×2): 325 mg via ORAL
  Filled 2013-07-19 (×3): qty 1

## 2013-07-19 MED ORDER — DOPAMINE-DEXTROSE 3.2-5 MG/ML-% IV SOLN
0.0000 ug/kg/min | INTRAVENOUS | Status: DC
  Administered 2013-07-19: 3 ug/kg/min via INTRAVENOUS
  Filled 2013-07-19: qty 250

## 2013-07-19 MED ORDER — PROTAMINE SULFATE 10 MG/ML IV SOLN
INTRAVENOUS | Status: DC | PRN
  Administered 2013-07-19: 10 mg via INTRAVENOUS
  Administered 2013-07-19: 20 mg via INTRAVENOUS

## 2013-07-19 MED ORDER — NICOTINE 21 MG/24HR TD PT24
21.0000 mg | MEDICATED_PATCH | Freq: Every day | TRANSDERMAL | Status: DC
  Administered 2013-07-20 – 2013-07-21 (×2): 21 mg via TRANSDERMAL
  Filled 2013-07-19 (×3): qty 1

## 2013-07-19 SURGICAL SUPPLY — 47 items
ADH SKN CLS APL DERMABOND .7 (GAUZE/BANDAGES/DRESSINGS) ×1
BAG DECANTER FOR FLEXI CONT (MISCELLANEOUS) ×3 IMPLANT
CANISTER SUCTION 2500CC (MISCELLANEOUS) ×3 IMPLANT
CANNULA VESSEL 3MM 2 BLNT TIP (CANNULA) ×3 IMPLANT
CATH ROBINSON RED A/P 18FR (CATHETERS) ×3 IMPLANT
CLIP TI MEDIUM 24 (CLIP) ×3 IMPLANT
CLIP TI WIDE RED SMALL 24 (CLIP) ×5 IMPLANT
COVER SURGICAL LIGHT HANDLE (MISCELLANEOUS) ×3 IMPLANT
CRADLE DONUT ADULT HEAD (MISCELLANEOUS) ×3 IMPLANT
DERMABOND ADVANCED (GAUZE/BANDAGES/DRESSINGS) ×2
DERMABOND ADVANCED .7 DNX12 (GAUZE/BANDAGES/DRESSINGS) ×1 IMPLANT
DRAIN CHANNEL 15F RND FF W/TCR (WOUND CARE) IMPLANT
DRAPE WARM FLUID 44X44 (DRAPE) ×3 IMPLANT
ELECT REM PT RETURN 9FT ADLT (ELECTROSURGICAL) ×3
ELECTRODE REM PT RTRN 9FT ADLT (ELECTROSURGICAL) ×1 IMPLANT
EVACUATOR SILICONE 100CC (DRAIN) IMPLANT
GLOVE BIO SURGEON STRL SZ7.5 (GLOVE) ×5 IMPLANT
GLOVE BIOGEL PI IND STRL 7.0 (GLOVE) IMPLANT
GLOVE BIOGEL PI IND STRL 8 (GLOVE) ×1 IMPLANT
GLOVE BIOGEL PI INDICATOR 7.0 (GLOVE) ×2
GLOVE BIOGEL PI INDICATOR 8 (GLOVE) ×4
GOWN STRL NON-REIN LRG LVL3 (GOWN DISPOSABLE) ×9 IMPLANT
KIT BASIN OR (CUSTOM PROCEDURE TRAY) ×3 IMPLANT
KIT ROOM TURNOVER OR (KITS) ×3 IMPLANT
NDL HYPO 25X1 1.5 SAFETY (NEEDLE) ×1 IMPLANT
NEEDLE HYPO 25X1 1.5 SAFETY (NEEDLE) ×3 IMPLANT
NS IRRIG 1000ML POUR BTL (IV SOLUTION) ×6 IMPLANT
PACK CAROTID (CUSTOM PROCEDURE TRAY) ×3 IMPLANT
PAD ARMBOARD 7.5X6 YLW CONV (MISCELLANEOUS) ×6 IMPLANT
PATCH HEMASHIELD 8X150 (Vascular Products) ×2 IMPLANT
SHUNT CAROTID BYPASS 10 (VASCULAR PRODUCTS) IMPLANT
SHUNT CAROTID BYPASS 12FRX15.5 (VASCULAR PRODUCTS) IMPLANT
SPONGE SURGIFOAM ABS GEL 100 (HEMOSTASIS) IMPLANT
SUT PROLENE 6 0 BV (SUTURE) ×5 IMPLANT
SUT PROLENE 7 0 BV 1 (SUTURE) IMPLANT
SUT SILK 2 0 FS (SUTURE) IMPLANT
SUT SILK 3 0 (SUTURE) ×3
SUT SILK 3 0 TIES 17X18 (SUTURE)
SUT SILK 3-0 18XBRD TIE 12 (SUTURE) IMPLANT
SUT SILK 3-0 18XBRD TIE BLK (SUTURE) IMPLANT
SUT VIC AB 3-0 SH 27 (SUTURE) ×3
SUT VIC AB 3-0 SH 27X BRD (SUTURE) ×1 IMPLANT
SUT VICRYL 4-0 PS2 18IN ABS (SUTURE) ×3 IMPLANT
SYR CONTROL 10ML LL (SYRINGE) ×3 IMPLANT
TOWEL OR 17X24 6PK STRL BLUE (TOWEL DISPOSABLE) ×3 IMPLANT
TOWEL OR 17X26 10 PK STRL BLUE (TOWEL DISPOSABLE) ×3 IMPLANT
WATER STERILE IRR 1000ML POUR (IV SOLUTION) ×3 IMPLANT

## 2013-07-19 NOTE — Transfer of Care (Signed)
Immediate Anesthesia Transfer of Care Note  Patient: Sarah Alvarez  Procedure(s) Performed: Procedure(s): ENDARTERECTOMY CAROTID-RIGHT (Right)  Patient Location: PACU  Anesthesia Type:General  Level of Consciousness: awake, alert  and oriented  Airway & Oxygen Therapy: Patient Spontanous Breathing and Patient connected to nasal cannula oxygen  Post-op Assessment: Report given to PACU RN and Post -op Vital signs reviewed and stable  Post vital signs: Reviewed and stable  Complications: No apparent anesthesia complications

## 2013-07-19 NOTE — Progress Notes (Signed)
[facility] Progress Note Patient Name: Sarah Alvarez DOB: [birth year] MRN: [id number]  Date of Service  07/19/2013   HPI/Events of Note   Hypoglycemia  eICU Interventions   Lantus d/c'd D5 1/2 NS started   Intervention Category Major Interventions: Other:  [name] 07/19/2013, 11:10 PM

## 2013-07-19 NOTE — Consult Note (Addendum)
Name: Sarah Alvarez MRN: [id number] DOB: [birth year]    ADMISSION DATE:  07/19/2013 CONSULTATION DATE:  07/19/2012   REFERRING MD :  Edilia Bo PRIMARY SERVICE: Vascular  CHIEF COMPLAINT:  Post op hypotension  BRIEF PATIENT DESCRIPTION: 63 y/o female with DM2 and CAD was admitted on 1/8 for an elective R CEA and had post op hypotension with nausea and vomiting so PCCM was consulted.  SIGNIFICANT EVENTS / STUDIES:  1/8 R CEA Edilia Bo  LINES / TUBES: 1/8 L radial a-line  CULTURES:   ANTIBIOTICS:   HISTORY OF PRESENT ILLNESS:  63 y/o female with DM2 and CAD was admitted on 1/8 for an elective R CEA and had post op hypotension with nausea and vomiting so PCCM was consulted.  She was feeling well pre-operatively without any fever, chills, nausea or vomiting.  The procedure went smoothly without complication but post operatively she was hypotensive and hypoglycemic.  She is now complaining of fatigue, nausea, and vomiting.  She does not have pain. She has not had fever, chills or cough.  She denies chest pain or shortness of breath.  PAST MEDICAL HISTORY :  Past Medical History  Diagnosis Date  . Diabetes mellitus   . Staph infection   . Restless leg syndrome   . Chronic mesenteric ischemia     s/p SMA stent 06/22/13  . Coronary artery disease    Past Surgical History  Procedure Laterality Date  . Cholecystectomy    . Abdominal hysterectomy    . Coronary angioplasty      ? LCX stent ~ 2000 in [place], [place] with LCX stent [facility] 09/1999 for reported re-instent stenosis   Prior to Admission medications   Medication Sig Start Date End Date Taking? Authorizing Provider  acetaminophen (TYLENOL) 650 MG CR tablet Take 650 mg by mouth every 8 (eight) hours as needed for pain.   Yes Historical Provider, MD  aspirin 325 MG tablet Take 1 tablet (325 mg total) by mouth daily. 06/25/13  Yes Jerald Kief, MD  atorvastatin (LIPITOR) 20 MG tablet Take 1 tablet (20 mg total) by mouth daily. 06/12/13   Yes Deepak Advani, MD  benazepril (LOTENSIN) 10 MG tablet Take 1 tablet (10 mg total) by mouth daily. 06/12/13  Yes Doris Cheadle, MD  clopidogrel (PLAVIX) 75 MG tablet Take 1 tablet (75 mg total) by mouth daily with breakfast. 06/25/13  Yes Jerald Kief, MD  insulin glargine (LANTUS) 100 UNIT/ML injection Inject 0.6 mLs (60 Units total) into the skin at bedtime. 06/12/13  Yes Jeanann Lewandowsky, MD  Insulin Syringe-Needle U-100 (INSULIN SYRINGE 1CC/28G) 28G X 1/2" 1 ML MISC Inject 60 Units into the skin every evening. 07/18/13  Yes Doris Cheadle, MD  Liniments (SALONPAS ARTHRITIS PAIN RELIEF) PADS Apply 1 each topically at bedtime as needed (pain on hand).   Yes Historical Provider, MD  metFORMIN (GLUMETZA) 500 MG (MOD) 24 hr tablet Take 1 tablet (500 mg total) by mouth 2 (two) times daily with a meal. 07/16/13  Yes Deepak Advani, MD  nicotine (NICODERM CQ) 21 mg/24hr patch Place 1 patch (21 mg total) onto the skin daily. 06/12/13  Yes Doris Cheadle, MD   No Known Allergies  FAMILY HISTORY:  Family History  Problem Relation Age of Onset  . Diabetes Mother   . Cancer Father   . Diabetes Sister   . Hypertension Sister   . Cancer Brother   . Cancer Maternal Aunt     breast cancer   . Diabetes Maternal  Grandmother   . Diabetes Paternal Grandmother    SOCIAL HISTORY:  reports that she quit smoking about 4 weeks ago. She has never used smokeless tobacco. She reports that she does not drink alcohol or use illicit drugs.  REVIEW OF SYSTEMS:   Gen: Denies fever, + chills, weight change, + fatigue, night sweats HEENT: Denies blurred vision, double vision, hearing loss, tinnitus, sinus congestion, rhinorrhea, sore throat, neck stiffness, dysphagia PULM: Denies shortness of breath, cough, sputum production, hemoptysis, wheezing CV: Denies chest pain, edema, orthopnea, paroxysmal nocturnal dyspnea, palpitations GI: Denies abdominal pain, + nausea, + vomiting, denies diarrhea, hematochezia, melena,  constipation, change in bowel habits GU: Denies dysuria, hematuria, polyuria, oliguria, urethral discharge Endocrine: Denies hot or cold intolerance, polyuria, polyphagia or appetite change Derm: Denies rash, dry skin, scaling or peeling skin change Heme: Denies easy bruising, bleeding, bleeding gums Neuro: Denies headache, numbness, weakness, slurred speech, loss of memory or consciousness   SUBJECTIVE:   VITAL SIGNS: Temp:  [97.7 F (36.5 C)-98.2 F (36.8 C)] 97.7 F (36.5 C) (01/08 1435) Pulse Rate:  [61-89] 73 (01/08 1435) Resp:  [7-36] 29 (01/08 1435) BP: (59-143)/(34-74) 98/55 mmHg (01/08 1435) SpO2:  [96 %-100 %] 97 % (01/08 1435) Arterial Line BP: (60-103)/(24-44) 85/40 mmHg (01/08 1435) Weight:  [65.545 kg (144 lb 8 oz)] 65.545 kg (144 lb 8 oz) (01/08 1100) HEMODYNAMICS:   VENTILATOR SETTINGS:   INTAKE / OUTPUT: Intake/Output     01/07 0701 - 01/08 0700 01/08 0701 - 01/09 0700   P.O.  200   I.V. (mL/kg)  2418.3 (36.9)   Total Intake(mL/kg)  2618.3 (39.9)   Emesis/NG output  100   Total Output   100   Net   +2518.3        Emesis Occurrence  2 x     PHYSICAL EXAMINATION: General:  Nauseated, fatigued HEENT: R surgical scar looks good, no drainage PULM: CTA B CV: RRR, no mgr AB: BS+, soft, nontender Ext: warm, no edema Neuro: Somnolent but arouses, maew  LABS:  CBC  Recent Labs Lab 07/17/13 1602 07/19/13 1525  WBC 8.7 9.9  HGB 12.2 10.4*  HCT 35.8* 30.5*  PLT 269 226   Coag's  Recent Labs Lab 07/17/13 1602  APTT 28  INR 0.96   BMET  Recent Labs Lab 07/17/13 1602 07/19/13 1525  NA 143  --   K 4.6  --   CL 104  --   CO2 28  --   BUN 16  --   CREATININE 0.55 0.55  GLUCOSE 164*  --    Electrolytes  Recent Labs Lab 07/17/13 1602  CALCIUM 9.2   Sepsis Markers No results found for this basename: LATICACIDVEN, PROCALCITON, O2SATVEN,  in the last 168 hours ABG No results found for this basename: PHART, PCO2ART, PO2ART,  in the  last 168 hours Liver Enzymes  Recent Labs Lab 07/17/13 1602  AST 14  ALT 11  ALKPHOS 85  BILITOT <0.2*  ALBUMIN 3.7   Cardiac Enzymes No results found for this basename: TROPONINI, PROBNP,  in the last 168 hours Glucose  Recent Labs Lab 07/19/13 1140 07/19/13 1205 07/19/13 1238 07/19/13 1324 07/19/13 1354 07/19/13 1423  GLUCAP 66* 118* 89 72 65* 127*    Imaging No results found.   CXR: pending  ASSESSMENT / PLAN:  PULMONARY A: No acute issues P:   -monitor O2  CARDIOVASCULAR A: Shock, post operative > most likely hypovolemic, doubt septic or cardiogenic, no evidence of bleeding P:  -  troponin -lactic acid -f/u post op cbc -check cortisol, use stress dose steroids if cortisol < 20 -bolus saline now -titrate up dopamine for MAP > 65 -if no improvement with IVF, may need CVL  RENAL A:  No acute issues P:   -IVF -morning BMET  GASTROINTESTINAL A:  Nausea/vomiting > due to narcotics from surgery? Viral effect? Normal belly exam encouraging 06/2013 hospitalization for nausea/vomiting felt to be related to mesenteric ischemia, received SMA stent P:   -prn zofran -IVF -npo for now -watch lactic acid closely -consider CT abdomen if no improvement  HEMATOLOGIC A:  No acute issues P:  -monitor for bleeding  INFECTIOUS A:  No acute issues P:   -peri-operative cefuroxime  ENDOCRINE A:  DM2   P:   -SSI q4h -hold metformin for now  NEUROLOGIC A:  Post op pain P:   -prn morphine   I have personally obtained a history, examined the patient, evaluated laboratory and imaging results, formulated the assessment and plan and placed orders. CRITICAL CARE: The patient is critically ill with multiple organ systems failure and requires high complexity decision making for assessment and support, frequent evaluation and titration of therapies, application of advanced monitoring technologies and extensive interpretation of multiple databases. Critical Care  Time devoted to patient care services described in this note is 40 minutes.     Jillyn Hidden PCCM Pager: [phone] Cell: [phone] If no response, call [phone]   07/19/2013, 4:31 PM

## 2013-07-19 NOTE — Anesthesia Postprocedure Evaluation (Signed)
Anesthesia Post Note  Patient: Sarah Alvarez  Procedure(s) Performed: Procedure(s) (LRB): ENDARTERECTOMY CAROTID-RIGHT (Right)  Anesthesia type: general  Patient location: PACU  Post pain: Pain level controlled  Post assessment: Patient's Cardiovascular Status Stable  Last Vitals:  Filed Vitals:   07/19/13 1345  BP: 102/41  Pulse: 71  Temp: 36.8 C  Resp: 10    Post vital signs: Reviewed and stable  Level of consciousness: sedated  Complications: No apparent anesthesia complications

## 2013-07-19 NOTE — Op Note (Signed)
    NAME: Sarah Alvarez    MRN: [id number] DOB: 1951-01-01    DATE OF OPERATION: 07/19/2013  PREOP DIAGNOSIS: Asymptomatic greater than 80% right carotid stenosis  POSTOP DIAGNOSIS: Same  PROCEDURE: Right carotid endarterectomy with Dacron patch angioplasty  SURGEON: Judeth Cornfield. Scot Dock, MD, FACS  ASSIST: Gerri Lins PA  ANESTHESIA: Gen.   EBL: minimal  INDICATIONS: Sarah Alvarez is a 63 y.o. female who was found to have a carotid bruit. This prompted a duplex scan which showed a greater than 80% right carotid stenosis. Right carotid endarterectomy was recommended in order to lower her risk of future stroke. Of note she had no significant carotid stenosis on the left.  FINDINGS: Extensive right carotid plaque extended well up into the internal carotid artery fairly high. For this reason, in order to have adequate visualization, this was done without a shunt, given  that she had excellent backbleeding.  TECHNIQUE: The patient was taken to the operating room and received a general anesthetic. Arterial line had been placed by anesthesia. The right neck was prepped and draped in usual sterile fashion. An incision was made along the anterior border of the sternocleidomastoid and dissection carried down to the common carotid artery which was dissected free and controlled with a Rummel tourniquet. The facial vein was divided between 2-0 silk ties. The internal carotid artery had significant plaque which extended very high. For this reason I had to fully mobilize the hypoglossal nerve by dividing the ansa cervicalis nerve. I also divided the occipital artery to allow adequate exposure distally. The external carotid artery and superior thyroid arteries were also controlled. The patient was then heparinized. Clamps were then placed on the internal then the external then the common carotid artery. A longitudinal arteriotomy was made in the common carotid artery and this was extended through the plaque  fairly high. Initially I placed a 10 shunt and there was excellent backbleeding. Because of the extent of the plaque which extended very high up the internal carotid artery, I did not think I would have adequate visualization distally and therefore elected to not use the shunt. An endarterectomy plane was established proximally and the plaque was sharply divided. Eversion endarterectomy was performed of the external carotid artery. Distally there was a nice taper the plaque and no tacking sutures were required. The artery was irrigated with copious amounts of heparin and dextran and all loose debris removed. The Dacron patch was then sewn using continuous 6-0 Prolene suture. Prior to completing the patch closure, the arteries were backbled and flushed up rapidly and the anastomosis completed. Flow was reestablished first to the external carotid artery and into the internal carotid artery. The patient was an excellent Doppler signal distal to the patch. The heparin was partially reversed with protamine. Hemostasis was obtained in the wound. The wounds closed the deep layer 3-0 Vicryl, the platysma was closed with running 3-0 Vicryl, and the skin was closed with 4-0 subcuticular stitch. Dermabond was applied. The patient tolerated the procedure well and awoke neurologically intact. She was transferred to the recovery in stable condition. All needle and sponge counts were correct.  Deitra Mayo, MD, FACS [facility]  DATE OF DICTATION:   07/19/2013

## 2013-07-19 NOTE — Progress Notes (Signed)
Arrival to PACU  With cbg 44, BP 50-60s/syt. Taken in rt arm and Aline noted in lt radial. Dr. Conrad Yale and Dr. Scot Dock updated. Fluid challenge per order with no response in BP. Dopamine started. Pt alert and oriented , color good. CBG treated with Dextrose and BP slowly responded to Dopamine . BP 20 points higher in rt leg than BP in RT arm.

## 2013-07-19 NOTE — Progress Notes (Signed)
Pt states she took full dose of Lantus at bedtime last night . CBG now 104 after Dextrose 50 % 1/2  Amp. Will continue to monitor CBG

## 2013-07-19 NOTE — Preoperative (Signed)
Beta Blockers   Reason not to administer Beta Blockers:Not Applicable 

## 2013-07-19 NOTE — Telephone Encounter (Signed)
LVM regarding post op appointment time and date, sent letter - kf

## 2013-07-19 NOTE — Progress Notes (Signed)
Pt b/p dropped again 81/36 (46) all other vital signs remain WDL. Pt again feeling nauseous and states she "does feel right". Dr. Scot Dock notified and stated he would consult CCM. Will continue to monitor.

## 2013-07-19 NOTE — Progress Notes (Addendum)
   VASCULAR POST OP NOTE  SUBJECTIVE: Some nausea. No CP. Some stiffness and numbness intermittently in her right hand.  PHYSICAL EXAM: Filed Vitals:   07/19/13 1300 07/19/13 1315 07/19/13 1330 07/19/13 1345  BP: 101/39 113/44 109/42 102/41  Pulse: 73 62 89 71  Temp:    98.2 F (36.8 C)  TempSrc:      Resp: 36 21 29 10   Height:      Weight:      SpO2: 99% 99% 97% 98%   Neuro: no focal weakness. Incision looks fine.  LABS: Lab Results  Component Value Date   WBC 8.7 07/17/2013   HGB 12.2 07/17/2013   HCT 35.8* 07/17/2013   MCV 96.0 07/17/2013   PLT 269 07/17/2013   Lab Results  Component Value Date   CREATININE 0.55 07/17/2013   Lab Results  Component Value Date   INR 0.96 07/17/2013   CBG (last 3)   Recent Labs  07/19/13 1324 07/19/13 1354 07/19/13 1423  GLUCAP 72 65* 127*    Active Problems:   Carotid stenosis   ASSESSMENT AND PLAN:  * Neuro intact post op  *  Having some hypotension requiring dopamine. But, she has diffuse vascular disease and BP may not be accurate. Will check EKG. If BP continues to be a problem, then may need Echo tomorrow. Last Echo was in 2010.   * CBG low- being treated.  Gae Gallop Beeper: [phone] 07/19/2013   EKG OK Given persistent problems with hypotension, will ask for CCM's help.  Deitra Mayo, MD, [place] [phone] 07/19/2013

## 2013-07-19 NOTE — Progress Notes (Signed)
Dr. Scot Dock at bedside. Questions accuracy of BP in arms , prefers to continue with taking BP in legs and will accept Systolic pressures above 65/

## 2013-07-19 NOTE — Progress Notes (Signed)
Hypoglycemic Event  CBG: 58  Treatment: 15 GM carbohydrate snack  Symptoms: None  Follow-up CBG: Time:2235 CBG Result:46  Treatment: 15 GM carbohydrate snack  Follow-up CBG: Time:2250     CBG Result: 46  Treatment: 25 cc D50  Follow-up CBG: Time: 2305    CBG Result: 107  Possible Reasons for Event: Inadequate meal intake  Comments/MD notified: Dr. Earnest Conroy with CCM notified. Started D5 0.45% NS gtt at 100cc/hr    Adline Peals  Remember to initiate Hypoglycemia Order Set & complete

## 2013-07-19 NOTE — Telephone Encounter (Signed)
Message copied by Berniece Salines on Thu Jul 19, 2013  1:50 PM ------      Message from: Denman George      Created: Thu Jul 19, 2013  1:04 PM      Regarding: FW: charge and f/u                   ----- Message -----         From: Angelia Mould, MD         Sent: 07/19/2013  10:58 AM           To: Patrici Ranks, Alfonso Patten, RN, #      Subject: charge and f/u                                           PROCEDURE: Right carotid endarterectomy with Dacron patch angioplasty            SURGEON: Judeth Cornfield. Scot Dock, MD, FACS            ASSIST: Gerri Lins PA            She will need a follow up visit in 2 weeks. Thank you. CD ------

## 2013-07-19 NOTE — H&P (View-Only) (Signed)
  VASCULAR PROGRESS NOTE  SUBJECTIVE: Abdominal pain is improved.  PHYSICAL EXAM: Filed Vitals:   06/23/13 2300 06/24/13 0558 06/24/13 1449 06/24/13 2125  BP: 142/79 151/65 152/71 160/72  Pulse: 84 70 76 72  Temp: 98.2 F (36.8 C) 98.6 F (37 C) 97.5 F (36.4 C) 97.7 F (36.5 C)  TempSrc:   Oral   Resp: 18 17 20 20   Height:      Weight:      SpO2: 99% 100% 100% 100%   Bilat carotid bruits. Neuro intact  LABS: Lab Results  Component Value Date   WBC 10.9* 06/20/2013   HGB 12.9 06/20/2013   HCT 37.6 06/20/2013   MCV 93.8 06/20/2013   PLT 273 06/20/2013   Lab Results  Component Value Date   CREATININE 0.45* 06/24/2013   Lab Results  Component Value Date   INR 0.91 06/21/2013   CBG (last 3)   Recent Labs  06/24/13 1606 06/24/13 2230 06/25/13 0701  GLUCAP 138* 173* 138*   CAROTID DUPLEX: There is a greater than 80% right carotid stenosis.  There is also a severe external carotid artery stenosis on the right. There is no significant stenosis on the left internal carotid artery.  CT ANGIOGRAM OF THE NECK: Confirms the greater than 80% right internal carotid artery stenosis. There is also a significant stenosis at the origin of the left common carotid artery just above the aortic arch. Is a 40% stenosis of the proximal left internal carotid artery.  Active Problems:   DM (diabetes mellitus)   Essential hypertension, benign   Other and unspecified hyperlipidemia   History of UTI   Dehydration   Nausea & vomiting   Gastroparesis   Nausea and vomiting   Carotid bruit   Chronic mesenteric ischemia  ASSESSMENT AND PLAN:  *  S/P PTA stent of SMA. Doing well. On ASA and Plavix  *  > 80% right carotid stenosis:  I will arrange elective right carotid endarterectomy after she has had a chance to regain her strength after this recent admission. I have reviewed the indications for carotid endarterectomy, that is to lower the risk of future stroke. I have also reviewed  the potential complications of surgery, including but not limited to: bleeding, stroke (perioperative risk 1-2%), MI, nerve injury of other unpredictable medical problems. All of the patients questions were answered and they are agreeable to proceed with surgery. We will schedule her surgery in early January. She is okay for discharge from our standpoint. She will remain on her aspirin and Plavix.   Gae Gallop Beeper: [phone] 06/25/2013

## 2013-07-19 NOTE — Interval H&P Note (Signed)
History and Physical Interval Note:  07/19/2013 8:18 AM  Sarah Alvarez  has presented today for surgery, with the diagnosis of RIGHT INTERNAL CAROTID ARTERY STENOSIS  The various methods of treatment have been discussed with the patient and family. After consideration of risks, benefits and other options for treatment, the patient has consented to  Procedure(s): ENDARTERECTOMY CAROTID-RIGHT (Right) as a surgical intervention .  The patient's history has been reviewed, patient examined, no change in status, stable for surgery.  I have reviewed the patient's chart and labs.  Questions were answered to the patient's satisfaction.     DICKSON,CHRISTOPHER S

## 2013-07-19 NOTE — Progress Notes (Signed)
Pt admitted from PACU d/t right carotid. Pt b/p 98/55 (62) all other vitals WDL. Pt then had two episodes of sever N/V. Dr. Scot Dock called and in to asses pt. Blood pressure post N/V 137/57. No new orders. Will continue to monitor.

## 2013-07-19 NOTE — Progress Notes (Signed)
  MEDICATION RELATED CONSULT NOTE - INITIAL   Pharmacy consulted for renal antibiotic adjustment. 15yof s/p carotid endarterectomy to receive Zinacef for post surgical prophylaxis. Wt 65.5kg, CrCl 70ml/min, NKA. Patient has Zinacef 1.5g IV q12h x 2 doses ordered - dosing appropriate. Pharmacy will sign off. Please reconsult if additional assistance is needed. Thanks!!  Earleen Newport, PharmD [phone] 07/19/2013

## 2013-07-19 NOTE — Anesthesia Preprocedure Evaluation (Addendum)
Anesthesia Evaluation  Patient identified by MRN, date of birth, ID band Patient awake    Reviewed: Allergy & Precautions, H&P , NPO status , Patient's Chart, lab work & pertinent test results  Airway Mallampati: I TM Distance: >3 FB Neck ROM: Full    Dental   Pulmonary former smoker,          Cardiovascular hypertension, Pt. on medications + CAD     Neuro/Psych    GI/Hepatic   Endo/Other  diabetes, Well Controlled, Type 2, Insulin Dependent and Oral Hypoglycemic Agents  Renal/GU      Musculoskeletal   Abdominal   Peds  Hematology   Anesthesia Other Findings   Reproductive/Obstetrics                          Anesthesia Physical Anesthesia Plan  ASA: III  Anesthesia Plan: General   Post-op Pain Management:    Induction: Intravenous  Airway Management Planned: Oral ETT  Additional Equipment: Arterial line  Intra-op Plan:   Post-operative Plan: Extubation in OR  Informed Consent: I have reviewed the patients History and Physical, chart, labs and discussed the procedure including the risks, benefits and alternatives for the proposed anesthesia with the patient or authorized representative who has indicated his/her understanding and acceptance.     Plan Discussed with: Surgeon and CRNA  Anesthesia Plan Comments:         Anesthesia Quick Evaluation

## 2013-07-20 LAB — BASIC METABOLIC PANEL
BUN: 7 mg/dL (ref 6–23)
CHLORIDE: 108 meq/L (ref 96–112)
CO2: 23 mEq/L (ref 19–32)
Calcium: 7.7 mg/dL — ABNORMAL LOW (ref 8.4–10.5)
Creatinine, Ser: 0.38 mg/dL — ABNORMAL LOW (ref 0.50–1.10)
GFR calc non Af Amer: >90 mL/min (ref >90)
GLUCOSE: 87 mg/dL (ref 70–99)
POTASSIUM: 3.7 meq/L (ref 3.7–5.3)
Sodium: 140 mEq/L (ref 137–147)

## 2013-07-20 LAB — CBC
HEMATOCRIT: 29.3 % — AB (ref 36.0–46.0)
Hemoglobin: 9.9 g/dL — ABNORMAL LOW (ref 12.0–15.0)
MCH: 32.8 pg (ref 26.0–34.0)
MCHC: 33.8 g/dL (ref 30.0–36.0)
MCV: 97 fL (ref 78.0–100.0)
Platelets: 210 10*3/uL (ref 150–400)
RBC: 3.02 MIL/uL — ABNORMAL LOW (ref 3.87–5.11)
RDW: 13.8 % (ref 11.5–15.5)
WBC: 8.3 10*3/uL (ref 4.0–10.5)

## 2013-07-20 LAB — GLUCOSE, CAPILLARY
GLUCOSE-CAPILLARY: 71 mg/dL (ref 70–99)
GLUCOSE-CAPILLARY: 65 mg/dL — AB (ref 70–99)
GLUCOSE-CAPILLARY: 97 mg/dL (ref 70–99)
Glucose-Capillary: 107 mg/dL — ABNORMAL HIGH (ref 70–99)
Glucose-Capillary: 88 mg/dL (ref 70–99)
Glucose-Capillary: 67 mg/dL — ABNORMAL LOW (ref 70–99)
Glucose-Capillary: 117 mg/dL — ABNORMAL HIGH (ref 70–99)
Glucose-Capillary: 169 mg/dL — ABNORMAL HIGH (ref 70–99)
Glucose-Capillary: 154 mg/dL — ABNORMAL HIGH (ref 70–99)

## 2013-07-20 LAB — TROPONIN I

## 2013-07-20 LAB — CORTISOL: Cortisol, Plasma: 10.7 ug/dL

## 2013-07-20 MED ORDER — DEXTROSE 50 % IV SOLN
25.0000 mL | Freq: Once | INTRAVENOUS | Status: AC | PRN
  Administered 2013-07-20: 25 mL via INTRAVENOUS

## 2013-07-20 MED ORDER — INSULIN ASPART 100 UNIT/ML ~~LOC~~ SOLN
0.0000 [IU] | SUBCUTANEOUS | Status: DC
  Administered 2013-07-20: 5 [IU] via SUBCUTANEOUS
  Administered 2013-07-21: 2 [IU] via SUBCUTANEOUS

## 2013-07-20 MED ORDER — ENSURE COMPLETE PO LIQD
237.0000 mL | Freq: Two times a day (BID) | ORAL | Status: DC
  Administered 2013-07-20 – 2013-07-21 (×3): 237 mL via ORAL

## 2013-07-20 MED ORDER — DEXTROSE 50 % IV SOLN
INTRAVENOUS | Status: AC
  Filled 2013-07-20: qty 50

## 2013-07-20 MED ORDER — DEXTROSE 10 % IV SOLN
INTRAVENOUS | Status: DC
  Administered 2013-07-20: 07:00:00 via INTRAVENOUS

## 2013-07-20 MED ORDER — OXYCODONE-ACETAMINOPHEN 5-325 MG PO TABS: 1.0000 | ORAL_TABLET | Freq: Four times a day (QID) | ORAL | Status: DC | PRN

## 2013-07-20 MED ORDER — INSULIN ASPART 100 UNIT/ML ~~LOC~~ SOLN: 0.0000 [IU] | Freq: Three times a day (TID) | SUBCUTANEOUS | Status: DC

## 2013-07-20 NOTE — Progress Notes (Signed)
   VASCULAR PROGRESS NOTE  SUBJECTIVE: Nausea resolved. Mild incisional pain.   PHYSICAL EXAM: Filed Vitals:   07/20/13 0615 07/20/13 0630 07/20/13 0645 07/20/13 0700  BP:    131/42  Pulse:  81 81 80  Temp:      TempSrc:      Resp: 18 13 16 15   Height:      Weight:      SpO2:  92% 88% 94%   Right neck incision looks fine. Neuro intact.   LABS: Lab Results  Component Value Date   WBC 8.3 07/20/2013   HGB 9.9* 07/20/2013   HCT 29.3* 07/20/2013   MCV 97.0 07/20/2013   PLT 210 07/20/2013   Lab Results  Component Value Date   CREATININE 0.38* 07/20/2013   Lab Results  Component Value Date   INR 0.96 07/17/2013   CBG (last 3)   Recent Labs  07/20/13 0425 07/20/13 0620 07/20/13 0638  GLUCAP 88 67* 117*    Active Problems:   DM (diabetes mellitus)   Nausea & vomiting   Carotid stenosis   Shock   ASSESSMENT AND PLAN:  * 1 Day Post-Op s/p: Right CEA. Neuro intact.   * Appreciate CCM's help. Hopefully can ween off of pressors this AM.  *  D/C foley when ok with CCM  * DM: CBG should improve as we resume her diet today, since her nausea has resolved.  * On ASA, Plavix, Lipitor.   Gae Gallop Beeper: [phone] 07/20/2013

## 2013-07-20 NOTE — Progress Notes (Signed)
Hypoglycemic Event  CBG: 67  Treatment: D50 IV 25 mL  Symptoms: Sweaty  Follow-up CBG: [id number] CBG Result:117  Possible Reasons for Event: Inadequate meal intake    Adline Peals  Remember to initiate Hypoglycemia Order Set & complete

## 2013-07-20 NOTE — Progress Notes (Signed)
Hypoglycemic Event  CBG: 65  Treatment: D50 IV 25 mL  Symptoms: None  Follow-up CBG: Time:0400 CBG Result:88  Possible Reasons for Event: Inadequate meal intake    Sarah Alvarez  Remember to initiate Hypoglycemia Order Set & complete

## 2013-07-20 NOTE — Consult Note (Signed)
Name: Sarah Alvarez MRN: [id number] DOB: [birth year]    ADMISSION DATE:  07/19/2013 CONSULTATION DATE:  07/19/2012   REFERRING MD :  Scot Dock PRIMARY SERVICE: Vascular  CHIEF COMPLAINT:  Post op hypotension  BRIEF PATIENT DESCRIPTION: 63 y/o female with DM2 and CAD was admitted on 1/8 for an elective R CEA and had post op hypotension with nausea and vomiting so PCCM was consulted.  SIGNIFICANT EVENTS / STUDIES:  1/8 R CEA Scot Dock  LINES / TUBES: 1/8 L radial a-line piv  CULTURES:  none ANTIBIOTICS: zinocef 1/8   SUBJECTIVE:  Pt improved. Less nausea  VITAL SIGNS: Temp:  [97.5 F (36.4 C)-98.8 F (37.1 C)] 98.1 F (36.7 C) (01/09 0756) Pulse Rate:  [61-93] 81 (01/09 0800) Resp:  [0-36] 20 (01/09 0800) BP: (59-143)/(33-57) 142/44 mmHg (01/09 0800) SpO2:  [88 %-100 %] 100 % (01/09 0800) Arterial Line BP: (60-161)/(24-78) 87/78 mmHg (01/09 0800) Weight:  [65.545 kg (144 lb 8 oz)] 65.545 kg (144 lb 8 oz) (01/08 1100) HEMODYNAMICS: On low dose DA   VENTILATOR SETTINGS: [place] oxygne   INTAKE / OUTPUT: Intake/Output     01/08 0701 - 01/09 0700 01/09 0701 - 01/10 0700   P.O. 200    I.V. (mL/kg) 4135.4 (63.1) 89.2 (1.4)   IV Piggyback 50 50   Total Intake(mL/kg) 4385.4 (66.9) 139.2 (2.1)   Urine (mL/kg/hr) 2300 (1.5)    Emesis/NG output 100 (0.1)    Total Output 2400     Net +1985.4 +139.2        Emesis Occurrence 2 x      PHYSICAL EXAMINATION: General: no distress HEENT: R surgical scar looks good, no drainage PULM: CTA B CV: RRR, no mgr AB: BS+, soft, nontender Ext: warm, no edema Neuro: Somnolent but arouses, maew  LABS:  CBC  Recent Labs Lab 07/17/13 1602 07/19/13 1525 07/20/13 0410  WBC 8.7 9.9 8.3  HGB 12.2 10.4* 9.9*  HCT 35.8* 30.5* 29.3*  PLT 269 226 210   Coag's  Recent Labs Lab 07/17/13 1602  APTT 28  INR 0.96   BMET  Recent Labs Lab 07/17/13 1602 07/19/13 1525 07/20/13 0410  NA 143  --  140  K 4.6  --  3.7  CL 104  --   108  CO2 28  --  23  BUN 16  --  7  CREATININE 0.55 0.55 0.38*  GLUCOSE 164*  --  87   Electrolytes  Recent Labs Lab 07/17/13 1602 07/20/13 0410  CALCIUM 9.2 7.7*   Sepsis Markers  Recent Labs Lab 07/19/13 1820  LATICACIDVEN 0.3*   ABG No results found for this basename: PHART, PCO2ART, PO2ART,  in the last 168 hours Liver Enzymes  Recent Labs Lab 07/17/13 1602  AST 14  ALT 11  ALKPHOS 85  BILITOT <0.2*  ALBUMIN 3.7   Cardiac Enzymes  Recent Labs Lab 07/19/13 1820 07/19/13 2115 07/20/13 0023  TROPONINI <0.30 <0.30 <0.30   Glucose  Recent Labs Lab 07/19/13 2308 07/20/13 0245 07/20/13 0425 07/20/13 0620 07/20/13 0638 07/20/13 0755  GLUCAP 107* 65* 88 67* 117* 97    Imaging Dg Chest Port 1 View  07/19/2013   CLINICAL DATA:  Postop hypotension.  EXAM: PORTABLE CHEST - 1 VIEW  COMPARISON:  07/17/2013.  FINDINGS: 1702 hr. The heart size and mediastinal contours are stable with mild aortic atherosclerosis. There are lower lung volumes with mild resulting bibasilar atelectasis. No edema, confluent airspace opacity or significant pleural effusion is seen. Telemetry leads  overlie the chest.  IMPRESSION: Mild bibasilar atelectasis. No acute cardiopulmonary process identified.   Electronically Signed   By: Camie Patience M.D.   On: 07/19/2013 17:21     CXR: ATX  ASSESSMENT / PLAN:  PULMONARY A:atelectasis P:   -monitor O2 -IS  CARDIOVASCULAR A: Shock, post operative > most likely hypovolemic, doubt septic or cardiogenic, no evidence of bleeding P:  -wean DA to off   RENAL A:  No acute issues P:   -IVF -monitor  BMET  GASTROINTESTINAL A:  Nausea/vomiting improved 06/2013 hospitalization for nausea/vomiting felt to be related to mesenteric ischemia, received SMA stent P:   -prn zofran -adv diet -watch lactic acid closely>>NORMAL t  HEMATOLOGIC A:  No acute issues P:  -monitor for bleeding  INFECTIOUS A:  No acute issues P:     -peri-operative cefuroxime  ENDOCRINE A:  DM2   P:   -SSI q4h -hold metformin for now  NEUROLOGIC A:  Post op pain P:   -prn morphine   I have personally obtained a history, examined the patient, evaluated laboratory and imaging results, formulated the assessment and plan and placed orders. CRITICAL CARE: The patient is critically ill with multiple organ systems failure and requires high complexity decision making for assessment and support, frequent evaluation and titration of therapies, application of advanced monitoring technologies and extensive interpretation of multiple databases. Critical Care Time devoted to patient care services described in this note is 40 minutes.     Mariel Sleet Beeper  [phone]  Cell  [phone]  If no response or cell goes to voicemail, call beeper [phone]    07/20/2013, 9:34 AM

## 2013-07-20 NOTE — Progress Notes (Signed)
[place] Progress Note Patient Name: Sarah Alvarez DOB: 1951/02/16 MRN: [id number]  Date of Service  07/20/2013   HPI/Events of Note  Patient with blood sugar greater than 200.  Had issues with hypoglycemia last pm, now on TID with meals insulin coverage to start tomorrow.  No coverage ordered for tonight.    eICU Interventions  Plan: Will change back to q4 hour coverage for tonight then change to TID with meals starting tomorrow.   Intervention Category Intermediate Interventions: Hyperglycemia - evaluation and treatment  [place] 07/20/2013, 10:22 PM

## 2013-07-20 NOTE — Progress Notes (Signed)
INITIAL NUTRITION ASSESSMENT  DOCUMENTATION CODES Per approved criteria  -Not Applicable   INTERVENTION: Ensure Complete po BID, each supplement provides 350 kcal and 13 grams of protein RD to follow for nutrition care plan  NUTRITION DIAGNOSIS: Inadequate oral intake related to decreased appetite, nausea as evidenced by meal tray observation  Goal: Pt to meet >/= 90% of their estimated nutrition needs   Monitor:  PO & supplemental intake, weight, labs, I/O's  Reason for Assessment: Malnutrition Screening Tool Report  63 y.o. female  Admitting Dx: R internal carotid artery stenosis  ASSESSMENT: Patient with PMH of DM and CAD; presented for elective surgery; developed post op hypotension with nausea and vomiting.  Patient s/p procedure 1/8: RIGHT CAROTID ENDARTERECTOMY  Patient reports her appetite is decreased; she's trying to eat as much as she can; + some nausea; couldn't eat the eggs for breakfast; weight has been stable; patient drinks Ensure supplements twice daily at home (AM & PM); would benefit from continuing during hospitalization -- RD to order.  Height: Ht Readings from Last 1 Encounters:  07/19/13 5\' 3"  (1.6 m)    Weight: Wt Readings from Last 1 Encounters:  07/19/13 144 lb 8 oz (65.545 kg)    Ideal Body Weight: 115 lb  % Ideal Body Weight: 125%  Wt Readings from Last 10 Encounters:  07/19/13 144 lb 8 oz (65.545 kg)  07/19/13 144 lb 8 oz (65.545 kg)  07/17/13 144 lb 8 oz (65.545 kg)  07/09/13 148 lb (67.132 kg)  06/22/13 135 lb 12.9 oz (61.6 kg)  06/12/13 140 lb 6.4 oz (63.685 kg)    Usual Body Weight: 148 lb  % Usual Body Weight: 97%  BMI:  Body mass index is 25.6 kg/(m^2).  Estimated Nutritional Needs: Kcal: 1600-1800 Protein: 75-85 gm Fluid: 1.6-1.8 L  Skin: Intact  Diet Order: Carb Control  EDUCATION NEEDS: -No education needs identified at this time   Intake/Output Summary (Last 24 hours) at 07/20/13 1042 Last data filed at  07/20/13 0808  Gross per 24 hour  Intake 3524.58 ml  Output   2400 ml  Net 1124.58 ml   Labs:   Recent Labs Lab 07/17/13 1602 07/19/13 1525 07/20/13 0410  NA 143  --  140  K 4.6  --  3.7  CL 104  --  108  CO2 28  --  23  BUN 16  --  7  CREATININE 0.55 0.55 0.38*  CALCIUM 9.2  --  7.7*  GLUCOSE 164*  --  87    CBG (last 3)   Recent Labs  07/20/13 0620 07/20/13 0638 07/20/13 0755  GLUCAP 67* 117* 97    Scheduled Meds: . aspirin  325 mg Oral Daily  . atorvastatin  20 mg Oral Daily  . clopidogrel  75 mg Oral Q breakfast  . docusate sodium  100 mg Oral Daily  . enoxaparin (LOVENOX) injection  30 mg Subcutaneous Q24H  . insulin aspart  0-15 Units Subcutaneous Q4H  . nicotine  21 mg Transdermal Daily  . pantoprazole  40 mg Oral Daily    Continuous Infusions: . sodium chloride Stopped (07/19/13 2322)  . dextrose 75 mL/hr at 07/20/13 0800  . DOPamine 5 mcg/kg/min (07/20/13 0800)    Past Medical History  Diagnosis Date  . Diabetes mellitus   . Staph infection   . Restless leg syndrome   . Chronic mesenteric ischemia     s/p SMA stent 06/22/13  . Coronary artery disease     Past  Surgical History  Procedure Laterality Date  . Cholecystectomy    . Abdominal hysterectomy    . Coronary angioplasty      ? LCX stent ~ 2000 in [place], [place] with LCX stent [facility] 09/1999 for reported re-instent stenosis    Arthur Holms, RD, LDN Pager #: [phone] After-Hours Pager #: [phone]

## 2013-07-20 NOTE — Progress Notes (Signed)
Utilization review completed. Aquila Delaughter, RN, BSN. 

## 2013-07-21 ENCOUNTER — Inpatient Hospital Stay (HOSPITAL_COMMUNITY): Payer: Medicaid Other

## 2013-07-21 DIAGNOSIS — I10 Essential (primary) hypertension: Secondary | ICD-10-CM

## 2013-07-21 LAB — BASIC METABOLIC PANEL
BUN: 9 mg/dL (ref 6–23)
CHLORIDE: 105 meq/L (ref 96–112)
CO2: 28 mEq/L (ref 19–32)
Calcium: 8.5 mg/dL (ref 8.4–10.5)
Creatinine, Ser: 0.48 mg/dL — ABNORMAL LOW (ref 0.50–1.10)
GFR calc Af Amer: >90 mL/min (ref >90)
Glucose, Bld: 100 mg/dL — ABNORMAL HIGH (ref 70–99)
Potassium: 4.7 mEq/L (ref 3.7–5.3)
SODIUM: 141 meq/L (ref 137–147)

## 2013-07-21 LAB — CBC WITH DIFFERENTIAL/PLATELET
BASOS ABS: 0 10*3/uL (ref 0.0–0.1)
BASOS PCT: 0 % (ref 0–1)
Eosinophils Absolute: 0.2 10*3/uL (ref 0.0–0.7)
Eosinophils Relative: 3 % (ref 0–5)
HCT: 28.7 % — ABNORMAL LOW (ref 36.0–46.0)
HEMOGLOBIN: 9.8 g/dL — AB (ref 12.0–15.0)
LYMPHS PCT: 26 % (ref 12–46)
Lymphs Abs: 1.6 10*3/uL (ref 0.7–4.0)
MCH: 32.8 pg (ref 26.0–34.0)
MCHC: 34.1 g/dL (ref 30.0–36.0)
MCV: 96 fL (ref 78.0–100.0)
Monocytes Absolute: 0.4 10*3/uL (ref 0.1–1.0)
Monocytes Relative: 7 % (ref 3–12)
NEUTROS PCT: 64 % (ref 43–77)
Neutro Abs: 3.8 10*3/uL (ref 1.7–7.7)
Platelets: 197 10*3/uL (ref 150–400)
RBC: 2.99 MIL/uL — ABNORMAL LOW (ref 3.87–5.11)
RDW: 13.3 % (ref 11.5–15.5)
WBC: 6 10*3/uL (ref 4.0–10.5)

## 2013-07-21 LAB — GLUCOSE, CAPILLARY
GLUCOSE-CAPILLARY: 92 mg/dL (ref 70–99)
GLUCOSE-CAPILLARY: 106 mg/dL — AB (ref 70–99)
Glucose-Capillary: 114 mg/dL — ABNORMAL HIGH (ref 70–99)
Glucose-Capillary: 136 mg/dL — ABNORMAL HIGH (ref 70–99)
Glucose-Capillary: 140 mg/dL — ABNORMAL HIGH (ref 70–99)
Glucose-Capillary: 210 mg/dL — ABNORMAL HIGH (ref 70–99)

## 2013-07-21 NOTE — Plan of Care (Signed)
Problem: Consults Goal: Diagnosis CEA/CES/AAA Stent Outcome: Completed/Met Date Met:  07/21/13 CEA

## 2013-07-21 NOTE — Progress Notes (Signed)
Name: Sarah Alvarez MRN: [id number] DOB: [birth year]    ADMISSION DATE:  07/19/2013 CONSULTATION DATE:  07/19/2012   REFERRING MD :  Scot Dock  CHIEF COMPLAINT:  Post op hypotension  BRIEF PATIENT DESCRIPTION: 64 y/o female with DM2 and CAD was admitted on 1/8 for an elective R CEA and had post op hypotension with nausea and vomiting so PCCM was consulted.  SUBJECTIVE:  Denies chest pain, dyspnea, abdominal pain.  Anxious to go home.  VITAL SIGNS: Temp:  [97.8 F (36.6 C)-98.6 F (37 C)] 98.3 F (36.8 C) (01/10 0802) Pulse Rate:  [77-97] 96 (01/10 0800) Resp:  [13-35] 26 (01/10 0800) BP: (67-166)/(38-86) 132/60 mmHg (01/10 0800) SpO2:  [88 %-100 %] 99 % (01/10 0800) Arterial Line BP: (106-137)/(41-50) 133/49 mmHg (01/09 1230) INTAKE / OUTPUT: Intake/Output     01/09 0701 - 01/10 0700 01/10 0701 - 01/11 0700   P.O. 1000    I.V. (mL/kg) 407 (6.2)    IV Piggyback 50    Total Intake(mL/kg) 1457 (22.2)    Urine (mL/kg/hr) 2800 (1.8)    Emesis/NG output     Total Output 2800     Net -1343.1            PHYSICAL EXAMINATION: General: no distress HEENT: R surgical scar looks good, no drainage PULM: CTA B CV: RRR, no mgr AB: BS+, soft, nontender Ext: warm, no edema Neuro: normal strength  LABS:  CBC  Recent Labs Lab 07/19/13 1525 07/20/13 0410 07/21/13 0234  WBC 9.9 8.3 6.0  HGB 10.4* 9.9* 9.8*  HCT 30.5* 29.3* 28.7*  PLT 226 210 197   Coag's  Recent Labs Lab 07/17/13 1602  APTT 28  INR 0.96   BMET  Recent Labs Lab 07/17/13 1602 07/19/13 1525 07/20/13 0410 07/21/13 0234  NA 143  --  140 141  K 4.6  --  3.7 4.7  CL 104  --  108 105  CO2 28  --  23 28  BUN 16  --  7 9  CREATININE 0.55 0.55 0.38* 0.48*  GLUCOSE 164*  --  87 100*   Electrolytes  Recent Labs Lab 07/17/13 1602 07/20/13 0410 07/21/13 0234  CALCIUM 9.2 7.7* 8.5   Sepsis Markers  Recent Labs Lab 07/19/13 1820  LATICACIDVEN 0.3*   Liver Enzymes  Recent Labs Lab  07/17/13 1602  AST 14  ALT 11  ALKPHOS 85  BILITOT <0.2*  ALBUMIN 3.7   Cardiac Enzymes  Recent Labs Lab 07/19/13 1820 07/19/13 2115 07/20/13 0023  TROPONINI <0.30 <0.30 <0.30   Glucose  Recent Labs Lab 07/20/13 2203 07/21/13 0021 07/21/13 0220 07/21/13 0417 07/21/13 0634 07/21/13 0757  GLUCAP 210* 114* 92 136* 140* 106*    Imaging Dg Chest Port 1 View  07/21/2013   CLINICAL DATA:  Atelectasis  EXAM: PORTABLE CHEST - 1 VIEW  COMPARISON:  07/19/2013  FINDINGS: Normal heart size and vascularity. Mild bronchitic and interstitial changes as before, nonspecific and appearing chronic. No definite CHF or pneumonia. No collapse or consolidation. Negative for effusion or pneumothorax. Skin fold overlies the right chest as before. Atherosclerosis of the aorta. Trachea is midline.  IMPRESSION: Stable chest exam without superimposed acute process   Electronically Signed   By: Daryll Brod M.D.   On: 07/21/2013 08:24   Dg Chest Port 1 View  07/19/2013   CLINICAL DATA:  Postop hypotension.  EXAM: PORTABLE CHEST - 1 VIEW  COMPARISON:  07/17/2013.  FINDINGS: 1702 hr. The heart size and  mediastinal contours are stable with mild aortic atherosclerosis. There are lower lung volumes with mild resulting bibasilar atelectasis. No edema, confluent airspace opacity or significant pleural effusion is seen. Telemetry leads overlie the chest.  IMPRESSION: Mild bibasilar atelectasis. No acute cardiopulmonary process identified.   Electronically Signed   By: Camie Patience M.D.   On: 07/19/2013 17:21   ASSESSMENT / PLAN: A:  Rt carotid endarterectomy 1/8. Hypotension after surgery >> likely from hypovolemia >> resolved. Hx of CAD. P:  -resume plavix, lotensin, lipitor, ASA per primary team  A: Atelectasis >> improved. P:   -bronchial hygiene  A: Anemia. P: -f/u CBC as outpt  A:   DM2   P:   -resume outpt meds per primary team   Medically stable for d/c home.  PCCM will sign  off.  Chesley Mires, MD [facility] Pulmonary/Critical Care 07/21/2013, 10:01 AM Pager:  [phone] After 3pm call: [phone]

## 2013-07-21 NOTE — Progress Notes (Signed)
    Subjective  - POD #2  C/o incisional pain OFF pressors since noon yesterday   Physical Exam:  Neuro intact Incision ok       Assessment/Plan:  POD #2  Doing well from vascular standpoint.  Appreciate CCM assistance with BP.  Not requiring pressors.  BP stable.  Would be OK to d/c home if cleared by CCM  BRABHAM IV, V. WELLS 07/21/2013 8:09 AM --  Filed Vitals:   07/21/13 0802  BP:   Pulse:   Temp: 98.3 F (36.8 C)  Resp:     Intake/Output Summary (Last 24 hours) at 07/21/13 0809 Last data filed at 07/21/13 0500  Gross per 24 hour  Intake 1117.8 ml  Output   2700 ml  Net -1582.2 ml     Laboratory CBC    Component Value Date/Time   WBC 6.0 07/21/2013 0234   HGB 9.8* 07/21/2013 0234   HCT 28.7* 07/21/2013 0234   PLT 197 07/21/2013 0234    BMET    Component Value Date/Time   NA 141 07/21/2013 0234   K 4.7 07/21/2013 0234   CL 105 07/21/2013 0234   CO2 28 07/21/2013 0234   GLUCOSE 100* 07/21/2013 0234   BUN 9 07/21/2013 0234   CREATININE 0.48* 07/21/2013 0234   CALCIUM 8.5 07/21/2013 0234   GFRNONAA >90 07/21/2013 0234   GFRAA >90 07/21/2013 0234    COAG Lab Results  Component Value Date   INR 0.96 07/17/2013   INR 0.91 06/21/2013   No results found for this basename: PTT    Antibiotics Anti-infectives   Start     Dose/Rate Route Frequency Ordered Stop   07/19/13 2000  cefUROXime (ZINACEF) 1.5 g in dextrose 5 % 50 mL IVPB     1.5 g 100 mL/hr over 30 Minutes Intravenous Every 12 hours 07/19/13 1432 07/20/13 0838   07/18/13 1250  cefUROXime (ZINACEF) 1.5 g in dextrose 5 % 50 mL IVPB     1.5 g 100 mL/hr over 30 Minutes Intravenous 30 min pre-op 07/18/13 1250 07/19/13 0838       V. Leia Alf, M.D. [facility] [place] Office: [phone] Pager:  [phone]

## 2013-07-23 ENCOUNTER — Encounter (HOSPITAL_COMMUNITY): Payer: Self-pay | Admitting: Vascular Surgery

## 2013-07-24 NOTE — Discharge Summary (Signed)
[facility] Discharge Summary  Sarah Alvarez [birth year] 63 y.o. female  [id number]  Admission Date: 07/19/2013  Discharge Date: 07/21/13  Physician: No att. providers found  Admission Diagnosis: RIGHT INTERNAL CAROTID ARTERY STENOSIS   HPI:   This is a 63 y.o. female who is s/p TA stent of SMA.  She was found to have > 80% right carotid stenosis.  Dr. Scot Dock will arrange elective right carotid endarterectomy after she has had a chance to regain her strength after this recent admission. I have reviewed the indications for carotid endarterectomy, that is to lower the risk of future stroke. I have also reviewed the potential complications of surgery, including but not limited to: bleeding, stroke (perioperative risk 1-2%), MI, nerve injury of other unpredictable medical problems. All of the patients questions were answered and they are agreeable to proceed with surgery. We will schedule her surgery in early January. She is okay for discharge from our standpoint. She will remain on her aspirin and Plavix.  Hospital Course:  The patient was admitted to the hospital and taken to the operating room on 07/19/2013 and underwent right carotid endarterectomy.  The pt tolerated the procedure well and was transported to the PACU in good condition.  That afternoon the pt did have some nausea/vomiting as well as hypotension requiring dopamine and fluid boluses.  She was transferred from stepdown to ICU.  Critical care was asked to help with management.   By POD 1, the pt neuro status in tact.  Her nausea had resolved.  Her pressors were able to be weaned successfully.    By POD 2, she was doing well and was discharged home.  Her hypotension was felt to be from her hypovolemia, which was resolved.  The remainder of the hospital course consisted of increasing mobilization and increasing intake of solids without difficulty.  CBC    Component Value Date/Time   WBC 6.0 07/21/2013 0234   RBC 2.99* 07/21/2013 0234   HGB 9.8* 07/21/2013 0234   HCT 28.7* 07/21/2013 0234   PLT 197 07/21/2013 0234   MCV 96.0 07/21/2013 0234   MCH 32.8 07/21/2013 0234   MCHC 34.1 07/21/2013 0234   RDW 13.3 07/21/2013 0234   LYMPHSABS 1.6 07/21/2013 0234   MONOABS 0.4 07/21/2013 0234   EOSABS 0.2 07/21/2013 0234   BASOSABS 0.0 07/21/2013 0234   BMET    Component Value Date/Time   NA 141 07/21/2013 0234   K 4.7 07/21/2013 0234   CL 105 07/21/2013 0234   CO2 28 07/21/2013 0234   GLUCOSE 100* 07/21/2013 0234   BUN 9 07/21/2013 0234   CREATININE 0.48* 07/21/2013 0234   CALCIUM 8.5 07/21/2013 0234   GFRNONAA >90 07/21/2013 0234   GFRAA >90 07/21/2013 0234      Discharge Instructions:   The patient is discharged to home with extensive instructions on wound care and progressive ambulation.  They are instructed not to drive or perform any heavy lifting until returning to see the physician in his office.  Discharge Orders   Future Appointments Provider Department Dept Phone   08/01/2013 11:00 AM Angelia Mould, MD [facility] [place] [phone]   08/13/2013 9:00 AM [facility] [place] [phone]   10/05/2013 4:30 PM Lorayne Marek, MD [place] [phone]   Future Orders Complete By Expires   Call MD for:  redness, tenderness, or signs of infection (pain, swelling, bleeding, redness, odor or green/yellow discharge around  incision site)  As directed    Call MD for:  severe or increased pain, loss or decreased feeling  in affected limb(s)  As directed    Call MD for:  temperature >100.5  As directed    Driving Restrictions  As directed    Comments:     No driving for 2 weeks   Increase activity slowly  As directed    Comments:     Walk with assistance use walker or cane as needed   Lifting restrictions  As directed    Comments:     No lifting for 6 weeks   May shower   As directed    Resume previous diet  As  directed       Discharge Diagnosis:  RIGHT INTERNAL CAROTID ARTERY STENOSIS  Secondary Diagnosis: Patient Active Problem List   Diagnosis Date Noted  . Carotid stenosis 07/19/2013  . Shock 07/19/2013  . Carotid bruit 06/21/2013  . Chronic mesenteric ischemia 06/21/2013  . Nausea and vomiting 06/20/2013  . Dehydration 06/19/2013  . Nausea & vomiting 06/19/2013  . Gastroparesis 06/19/2013  . DM (diabetes mellitus) 06/12/2013  . Essential hypertension, benign 06/12/2013  . Other and unspecified hyperlipidemia 06/12/2013  . History of UTI 06/12/2013   Past Medical History  Diagnosis Date  . Diabetes mellitus   . Staph infection   . Restless leg syndrome   . Chronic mesenteric ischemia     s/p SMA stent 06/22/13  . Coronary artery disease       Medication List         acetaminophen 650 MG CR tablet  Commonly known as:  TYLENOL  Take 650 mg by mouth every 8 (eight) hours as needed for pain.     aspirin 325 MG tablet  Take 1 tablet (325 mg total) by mouth daily.     atorvastatin 20 MG tablet  Commonly known as:  LIPITOR  Take 1 tablet (20 mg total) by mouth daily.     benazepril 10 MG tablet  Commonly known as:  LOTENSIN  Take 1 tablet (10 mg total) by mouth daily.     clopidogrel 75 MG tablet  Commonly known as:  PLAVIX  Take 1 tablet (75 mg total) by mouth daily with breakfast.     insulin glargine 100 UNIT/ML injection  Commonly known as:  LANTUS  Inject 0.6 mLs (60 Units total) into the skin at bedtime.     INSULIN SYRINGE 1CC/28G 28G X 1/2" 1 ML Misc  Inject 60 Units into the skin every evening.     metFORMIN 500 MG (MOD) 24 hr tablet  Commonly known as:  GLUMETZA  Take 1 tablet (500 mg total) by mouth 2 (two) times daily with a meal.     nicotine 21 mg/24hr patch  Commonly known as:  NICODERM CQ  Place 1 patch (21 mg total) onto the skin daily.     oxyCODONE-acetaminophen 5-325 MG per tablet  Commonly known as:  PERCOCET/ROXICET  Take 1 tablet  by mouth every 6 (six) hours as needed for moderate pain.     SALONPAS ARTHRITIS PAIN RELIEF Pads  Apply 1 each topically at bedtime as needed (pain on hand).        Roxicet   Disposition: home  Patient's condition: is Good  Follow up: 1. Dr.  Scot Dock in 2 weeks.   Leontine Locket, PA-C [facility] [phone]  --- For [facility] use --- Instructions: Press F2 to tab through selections.  Delete question if not applicable.   Modified Rankin score at D/C (0-6): 0  IV medication needed for:  1. Hypertension: No 2. Hypotension: Yes  Post-op Complications: No  1. Post-op CVA or TIA: No  If yes: Event classification (right eye, left eye, right cortical, left cortical, verterobasilar, other): n/a  If yes: Timing of event (intra-op, <6 hrs post-op, >=6 hrs post-op, unknown): n/a  2. CN injury: No  If yes: CN n/a injuried   3. Myocardial infarction: No  If yes: Dx by (EKG or clinical, Troponin): n/a  4.  CHF: No  5.  Dysrhythmia (new): No  6. Wound infection: No  7. Reperfusion symptoms: No  8. Return to OR: No  If yes: return to OR for (bleeding, neurologic, other CEA incision, other): n/a  Discharge medications: Statin use:  Yes If No: [ ]  For Medical reasons, [ ]  Non-compliant, [ ]  Not-indicated ASA use:  Yes  If No: [ ]  For Medical reasons, [ ]  Non-compliant, [ ]  Not-indicated Beta blocker use:  No If No: [ ]  For Medical reasons, [ ]  Non-compliant, [ ]  Not-indicated ACE-Inhibitor use:  Yes If No: [ ]  For Medical reasons, [ ]  Non-compliant, [ ]  Not-indicated P2Y12 Antagonist use: yes, [ x] Plavix, [ ]  Plasugrel, [ ]  Ticlopinine, [ ]  Ticagrelor, [ ]  Other, [ ]  No for medical reason, [ ]  Non-compliant, [ ]  Not-indicated Anti-coagulant use:  no, [ ]  Warfarin, [ ]  Rivaroxaban, [ ]  Dabigatran, [ ]  Other, [ ]  No for medical reason, [ ]  Non-compliant, [ ]  Not-indicated

## 2013-07-24 NOTE — Discharge Summary (Signed)
Agree with plans for D/C.  Deitra Mayo, MD, [place] [phone] 07/24/2013

## 2013-07-25 ENCOUNTER — Ambulatory Visit
Admission: RE | Admit: 2013-07-25 | Discharge: 2013-07-25 | Disposition: A | Discharge status: Home / Self Care | Payer: Medicaid Other | Source: Ambulatory Visit | Attending: Radiology | Admitting: Radiology

## 2013-07-25 DIAGNOSIS — K559 Vascular disorder of intestine, unspecified: Secondary | ICD-10-CM

## 2013-07-25 NOTE — Progress Notes (Signed)
Recent hospitalization for Right carotid endarderectomy.  Appetite:  Good.    Denies nausea, vomiting.  C/o constipation.    Denies abdominal discomfort as pre SMA stent/angioplasty.  Has experienced some discomfort associated with constipation.    Sleeping better.  Loreen Bankson Riki Rusk, RN 07/25/2013 3:28 PM

## 2013-07-31 ENCOUNTER — Encounter: Payer: Self-pay | Admitting: Vascular Surgery

## 2013-08-01 ENCOUNTER — Ambulatory Visit (INDEPENDENT_AMBULATORY_CARE_PROVIDER_SITE_OTHER): Payer: No Typology Code available for payment source | Admitting: Vascular Surgery

## 2013-08-01 ENCOUNTER — Encounter: Payer: Self-pay | Admitting: Vascular Surgery

## 2013-08-01 VITALS — BP 115/64 | HR 95 | Ht 63.0 in | Wt 141.0 lb

## 2013-08-01 DIAGNOSIS — I6529 Occlusion and stenosis of unspecified carotid artery: Secondary | ICD-10-CM | POA: Insufficient documentation

## 2013-08-01 DIAGNOSIS — K551 Chronic vascular disorders of intestine: Secondary | ICD-10-CM

## 2013-08-01 NOTE — Assessment & Plan Note (Addendum)
The patient is doing well status post right carotid endarterectomy. She is on aspirin. She is on Plavix. She is also on a statin. I've ordered a follow up carotid duplex scan in 6 months and I'll see her back at that time. She knows to call sooner if she has problems. Fortunately she is not a current smoker.

## 2013-08-01 NOTE — Progress Notes (Signed)
   Patient name: Sarah Alvarez MRN: [id number] DOB: June 12, 1951 Sex: female  REASON FOR VISIT: Follow up after right carotid endarterectomy  HPI: Sarah Alvarez is a 63 y.o. female who was found to have a carotid bruit during a hospitalization. Carotid duplex scan showed a greater than 80% right carotid stenosis. She underwent a right carotid endarterectomy with Dacron patch angioplasty on 07/19/2013. He returns for a two-week follow up visit. 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 This was done by interventional radiology.  She comes in for a two-week follow up visit. She denies any focal weakness or paresthesias. Her postprandial abdominal pain has resolved and she's gained approximately 5 pounds.  REVIEW OF SYSTEMS: Valu.Nieves ] denotes positive finding; [  ] denotes negative finding  CARDIOVASCULAR:  [ ]  chest pain   [ ]  dyspnea on exertion    CONSTITUTIONAL:  [ ]  fever   [ ]  chills  PHYSICAL EXAM: Filed Vitals:   08/01/13 1108 08/01/13 1109  BP: 131/64 115/64  Pulse: 95   Height: 5\' 3"  (1.6 m)   Weight: 141 lb (63.957 kg)   SpO2: 100%    Body mass index is 24.98 kg/(m^2). GENERAL: The patient is a well-nourished female, in no acute distress. The vital signs are documented above. CARDIOVASCULAR: There is a regular rate and rhythm. PULMONARY: There is good air exchange bilaterally without wheezing or rales. Her right neck incision is healed nicely. She has a right carotid bruit.  MEDICAL ISSUES:  Occlusion and stenosis of carotid artery without mention of cerebral infarction The patient is doing well status post right carotid endarterectomy. She is on aspirin. She is on Plavix. She is also on a statin. I've ordered a follow up carotid duplex scan in 6 months and I'll see her back at that time. She knows to call sooner if she has problems. Fortunately she is not a current smoker.  Chronic  mesenteric ischemia Her postprandial abdominal pain has resolved she has gained approximately 5 pounds. She is status post successful PT and stenting of her superior mesenteric artery with an excellent result.   [place] [facility] [place] Beeper: [phone]

## 2013-08-01 NOTE — Addendum Note (Signed)
Addended by: Mariona Scholes K on: 06/11/2014 05:33 PM   Modules accepted: Orders  

## 2013-08-01 NOTE — Assessment & Plan Note (Signed)
Her postprandial abdominal pain has resolved she has gained approximately 5 pounds. She is status post successful PT and stenting of her superior mesenteric artery with an excellent result.

## 2013-08-08 ENCOUNTER — Other Ambulatory Visit (HOSPITAL_COMMUNITY): Payer: Self-pay | Admitting: Interventional Radiology

## 2013-08-08 DIAGNOSIS — R109 Unspecified abdominal pain: Secondary | ICD-10-CM

## 2013-08-08 DIAGNOSIS — I771 Stricture of artery: Principal | ICD-10-CM

## 2013-08-08 DIAGNOSIS — K551 Chronic vascular disorders of intestine: Secondary | ICD-10-CM

## 2013-08-09 ENCOUNTER — Ambulatory Visit
Admission: RE | Admit: 2013-08-09 | Discharge: 2013-08-09 | Disposition: A | Discharge status: Home / Self Care | Payer: Medicaid Other | Source: Ambulatory Visit | Attending: Interventional Radiology | Admitting: Interventional Radiology

## 2013-08-09 DIAGNOSIS — I771 Stricture of artery: Principal | ICD-10-CM

## 2013-08-09 DIAGNOSIS — K551 Chronic vascular disorders of intestine: Secondary | ICD-10-CM

## 2013-08-09 DIAGNOSIS — R109 Unspecified abdominal pain: Secondary | ICD-10-CM

## 2013-08-09 MED ORDER — IOHEXOL 350 MG/ML SOLN
100.0000 mL | Freq: Once | INTRAVENOUS | Status: AC | PRN
  Administered 2013-08-09: 100 mL via INTRAVENOUS

## 2013-08-09 NOTE — Progress Notes (Signed)
Patient c/o abd pain starting on 08/07/2013 & describes pain as constant, throbbing lower abd pain.  Pain increases after meals and at night.  Has been taking Tylenol 500 mg tabs 2 Q4 hrs.  States that she is out of prescription pain meds.    Appetite:  Good.  Denies nausea, vomiting or diarrhea.   Denies fever.  Mong Neal Riki Rusk, RN 08/09/2013 3:34 PM

## 2013-08-10 ENCOUNTER — Other Ambulatory Visit: Payer: Self-pay | Admitting: Interventional Radiology

## 2013-08-10 DIAGNOSIS — K551 Chronic vascular disorders of intestine: Secondary | ICD-10-CM

## 2013-08-13 ENCOUNTER — Other Ambulatory Visit (HOSPITAL_COMMUNITY): Payer: Self-pay | Admitting: Interventional Radiology

## 2013-08-13 ENCOUNTER — Other Ambulatory Visit: Payer: Self-pay | Admitting: Radiology

## 2013-08-13 ENCOUNTER — Ambulatory Visit: Payer: No Typology Code available for payment source | Attending: Internal Medicine

## 2013-08-13 DIAGNOSIS — K551 Chronic vascular disorders of intestine: Secondary | ICD-10-CM

## 2013-08-13 DIAGNOSIS — Z139 Encounter for screening, unspecified: Secondary | ICD-10-CM

## 2013-08-13 LAB — CBC WITH DIFFERENTIAL/PLATELET
Basophils Absolute: 0 10*3/uL (ref 0.0–0.1)
Basophils Relative: 0 % (ref 0–1)
Eosinophils Absolute: 0.1 10*3/uL (ref 0.0–0.7)
Eosinophils Relative: 2 % (ref 0–5)
HCT: 35.9 % — ABNORMAL LOW (ref 36.0–46.0)
Hemoglobin: 11.9 g/dL — ABNORMAL LOW (ref 12.0–15.0)
Lymphocytes Relative: 23 % (ref 12–46)
Lymphs Abs: 1.3 10*3/uL (ref 0.7–4.0)
MCH: 31.7 pg (ref 26.0–34.0)
MCHC: 33.1 g/dL (ref 30.0–36.0)
MCV: 95.7 fL (ref 78.0–100.0)
Monocytes Absolute: 0.2 10*3/uL (ref 0.1–1.0)
Monocytes Relative: 3 % (ref 3–12)
NEUTROS PCT: 72 % (ref 43–77)
Neutro Abs: 4.1 10*3/uL (ref 1.7–7.7)
PLATELETS: 318 10*3/uL (ref 150–400)
RBC: 3.75 MIL/uL — AB (ref 3.87–5.11)
RDW: 13.6 % (ref 11.5–15.5)
WBC: 5.7 10*3/uL (ref 4.0–10.5)

## 2013-08-13 LAB — COMPLETE METABOLIC PANEL WITH GFR
AST: 10 U/L (ref 0–37)
Albumin: 3.9 g/dL (ref 3.5–5.2)
Alkaline Phosphatase: 84 U/L (ref 39–117)
BILIRUBIN TOTAL: 0.2 mg/dL (ref 0.2–1.2)
BUN: 11 mg/dL (ref 6–23)
CO2: 26 meq/L (ref 19–32)
Calcium: 9.1 mg/dL (ref 8.4–10.5)
Chloride: 107 mEq/L (ref 96–112)
Creat: 0.49 mg/dL — ABNORMAL LOW (ref 0.50–1.10)
GFR, Est Non African American: >89 mL/min
Glucose, Bld: 245 mg/dL — ABNORMAL HIGH (ref 70–99)
Potassium: 4.5 mEq/L (ref 3.5–5.3)
SODIUM: 141 meq/L (ref 135–145)
TOTAL PROTEIN: 5.9 g/dL — AB (ref 6.0–8.3)

## 2013-08-13 LAB — LIPID PANEL
Cholesterol: 124 mg/dL (ref 0–200)
HDL: 38 mg/dL — ABNORMAL LOW (ref >39)
LDL Cholesterol: 65 mg/dL (ref 0–99)
Total CHOL/HDL Ratio: 3.3 Ratio
Triglycerides: 105 mg/dL (ref <150)
VLDL: 21 mg/dL (ref 0–40)

## 2013-08-13 LAB — TSH: TSH: 0.555 u[IU]/mL (ref 0.350–4.500)

## 2013-08-14 ENCOUNTER — Encounter (HOSPITAL_COMMUNITY): Payer: Self-pay | Admitting: Pharmacy Technician

## 2013-08-14 LAB — VITAMIN D 25 HYDROXY (VIT D DEFICIENCY, FRACTURES): VIT D 25 HYDROXY: 16 ng/mL — AB (ref 30–89)

## 2013-08-14 MED ORDER — VITAMIN D (ERGOCALCIFEROL) 1.25 MG (50000 UNIT) PO CAPS: 50000.0000 [IU] | ORAL_CAPSULE | ORAL | Status: DC

## 2013-08-17 ENCOUNTER — Encounter (HOSPITAL_COMMUNITY): Payer: Self-pay

## 2013-08-17 ENCOUNTER — Ambulatory Visit (HOSPITAL_COMMUNITY)
Admission: RE | Admit: 2013-08-17 | Discharge: 2013-08-17 | Disposition: A | Discharge status: Home / Self Care | Payer: Medicaid Other | Source: Ambulatory Visit | Attending: Interventional Radiology | Admitting: Interventional Radiology

## 2013-08-17 ENCOUNTER — Ambulatory Visit: Payer: Self-pay | Admitting: Internal Medicine

## 2013-08-17 ENCOUNTER — Other Ambulatory Visit (HOSPITAL_COMMUNITY): Payer: Self-pay | Admitting: Interventional Radiology

## 2013-08-17 DIAGNOSIS — I774 Celiac artery compression syndrome: Secondary | ICD-10-CM | POA: Insufficient documentation

## 2013-08-17 DIAGNOSIS — Z90710 Acquired absence of both cervix and uterus: Secondary | ICD-10-CM | POA: Insufficient documentation

## 2013-08-17 DIAGNOSIS — Z9089 Acquired absence of other organs: Secondary | ICD-10-CM | POA: Insufficient documentation

## 2013-08-17 DIAGNOSIS — Z79899 Other long term (current) drug therapy: Secondary | ICD-10-CM | POA: Insufficient documentation

## 2013-08-17 DIAGNOSIS — Z87891 Personal history of nicotine dependence: Secondary | ICD-10-CM | POA: Insufficient documentation

## 2013-08-17 DIAGNOSIS — Z7982 Long term (current) use of aspirin: Secondary | ICD-10-CM | POA: Insufficient documentation

## 2013-08-17 DIAGNOSIS — Z9861 Coronary angioplasty status: Secondary | ICD-10-CM | POA: Insufficient documentation

## 2013-08-17 DIAGNOSIS — Z794 Long term (current) use of insulin: Secondary | ICD-10-CM | POA: Insufficient documentation

## 2013-08-17 DIAGNOSIS — Y834 Other reconstructive surgery as the cause of abnormal reaction of the patient, or of later complication, without mention of misadventure at the time of the procedure: Secondary | ICD-10-CM | POA: Insufficient documentation

## 2013-08-17 DIAGNOSIS — K551 Chronic vascular disorders of intestine: Secondary | ICD-10-CM

## 2013-08-17 DIAGNOSIS — E119 Type 2 diabetes mellitus without complications: Secondary | ICD-10-CM | POA: Insufficient documentation

## 2013-08-17 DIAGNOSIS — G2581 Restless legs syndrome: Secondary | ICD-10-CM | POA: Insufficient documentation

## 2013-08-17 DIAGNOSIS — I2510 Atherosclerotic heart disease of native coronary artery without angina pectoris: Secondary | ICD-10-CM | POA: Insufficient documentation

## 2013-08-17 DIAGNOSIS — T82898A Other specified complication of vascular prosthetic devices, implants and grafts, initial encounter: Secondary | ICD-10-CM | POA: Insufficient documentation

## 2013-08-17 LAB — APTT: aPTT: 28 seconds (ref 24–37)

## 2013-08-17 LAB — CBC
HCT: 34.5 % — ABNORMAL LOW (ref 36.0–46.0)
Hemoglobin: 11.7 g/dL — ABNORMAL LOW (ref 12.0–15.0)
MCH: 32.1 pg (ref 26.0–34.0)
MCHC: 33.9 g/dL (ref 30.0–36.0)
MCV: 94.5 fL (ref 78.0–100.0)
PLATELETS: 286 10*3/uL (ref 150–400)
RBC: 3.65 MIL/uL — ABNORMAL LOW (ref 3.87–5.11)
RDW: 13 % (ref 11.5–15.5)
WBC: 6.1 10*3/uL (ref 4.0–10.5)

## 2013-08-17 LAB — COMPREHENSIVE METABOLIC PANEL
ALBUMIN: 3.8 g/dL (ref 3.5–5.2)
ALT: 6 U/L (ref 0–35)
AST: 12 U/L (ref 0–37)
Alkaline Phosphatase: 81 U/L (ref 39–117)
BUN: 12 mg/dL (ref 6–23)
CALCIUM: 8.8 mg/dL (ref 8.4–10.5)
CO2: 23 mEq/L (ref 19–32)
CREATININE: 0.48 mg/dL — AB (ref 0.50–1.10)
Chloride: 104 mEq/L (ref 96–112)
GFR calc Af Amer: >90 mL/min (ref >90)
GFR calc non Af Amer: >90 mL/min (ref >90)
Glucose, Bld: 117 mg/dL — ABNORMAL HIGH (ref 70–99)
Potassium: 3.9 mEq/L (ref 3.7–5.3)
Sodium: 140 mEq/L (ref 137–147)
TOTAL PROTEIN: 6.4 g/dL (ref 6.0–8.3)
Total Bilirubin: <0.2 mg/dL — ABNORMAL LOW (ref 0.3–1.2)

## 2013-08-17 LAB — PROTIME-INR
INR: 1.04 (ref 0.00–1.49)
Prothrombin Time: 13.4 seconds (ref 11.6–15.2)

## 2013-08-17 LAB — GLUCOSE, CAPILLARY: GLUCOSE-CAPILLARY: 124 mg/dL — AB (ref 70–99)

## 2013-08-17 MED ORDER — MIDAZOLAM HCL 2 MG/2ML IJ SOLN
INTRAMUSCULAR | Status: AC
  Filled 2013-08-17: qty 4

## 2013-08-17 MED ORDER — IODIXANOL 320 MG/ML IV SOLN
50.0000 mL | Freq: Once | INTRAVENOUS | Status: AC | PRN
  Administered 2013-08-17: 40 mL via INTRA_ARTERIAL

## 2013-08-17 MED ORDER — LIDOCAINE HCL 1 % IJ SOLN
INTRAMUSCULAR | Status: AC
  Filled 2013-08-17: qty 20

## 2013-08-17 MED ORDER — FENTANYL CITRATE 0.05 MG/ML IJ SOLN
INTRAMUSCULAR | Status: AC | PRN
  Administered 2013-08-17 (×2): 50 ug via INTRAVENOUS
  Administered 2013-08-17: 100 ug via INTRAVENOUS

## 2013-08-17 MED ORDER — HYDROCODONE-ACETAMINOPHEN 5-325 MG PO TABS: 1.0000 | ORAL_TABLET | ORAL | Status: DC | PRN

## 2013-08-17 MED ORDER — MIDAZOLAM HCL 2 MG/2ML IJ SOLN
INTRAMUSCULAR | Status: AC | PRN
  Administered 2013-08-17: 1 mg via INTRAVENOUS
  Administered 2013-08-17: 2 mg via INTRAVENOUS

## 2013-08-17 MED ORDER — SODIUM CHLORIDE 0.9 % IV SOLN
INTRAVENOUS | Status: DC
  Administered 2013-08-17: 08:00:00 via INTRAVENOUS

## 2013-08-17 MED ORDER — HEPARIN SODIUM (PORCINE) 1000 UNIT/ML IJ SOLN
INTRAMUSCULAR | Status: AC
  Filled 2013-08-17: qty 1

## 2013-08-17 MED ORDER — IOHEXOL 300 MG/ML  SOLN
80.0000 mL | Freq: Once | INTRAMUSCULAR | Status: AC | PRN
  Administered 2013-08-17: 60 mL via INTRA_ARTERIAL

## 2013-08-17 MED ORDER — SODIUM CHLORIDE 0.9 % IV SOLN: INTRAVENOUS | Status: DC

## 2013-08-17 MED ORDER — FENTANYL CITRATE 0.05 MG/ML IJ SOLN
INTRAMUSCULAR | Status: AC
  Filled 2013-08-17: qty 4

## 2013-08-17 MED ORDER — HEPARIN SODIUM (PORCINE) 1000 UNIT/ML IJ SOLN
3000.0000 [IU] | Freq: Once | INTRAMUSCULAR | Status: AC
  Administered 2013-08-17: 3000 [IU] via INTRAVENOUS

## 2013-08-17 NOTE — ED Notes (Signed)
LR bolus hung. Total bolus thus far, 355ml.

## 2013-08-17 NOTE — Discharge Instructions (Signed)
Arteriogram Care After These instructions give you information on caring for yourself after your procedure. Your doctor may also give you more specific instructions. Call your doctor if you have any problems or questions after your procedure. HOME CARE  Stay in bed the rest of the day.  Keep your leg straight for at least 6 hours.  Do not lift anything heavier than 10 pounds (about a gallon of milk) for 2 days.  Do not walk a lot, run, or drive for 2 days.  Return to normal activities in 2 days or as told by your doctor. Finding out the results of your test Ask when your test results will be ready. Make sure you get your test results. GET HELP RIGHT AWAY IF:   You have fever of 102 F (38.9 C) or higher.  You have more pain in your leg.  The leg that was cut is:  Bleeding.  Puffy (swollen) or red.  Cold.  Pale or changes color.  Weak.  Tingly or numb. If you go to the Emergency Room, tell your nurse that you have had an arteriogram. Take this paper with you to show the nurse. MAKE SURE YOU:  Understand these instructions.  Will watch your condition.  Will get help right away if you are not doing well or get worse. Document Released: 09/24/2008 Document Revised: 09/20/2011 Document Reviewed: 09/24/2008 [facility] Patient Information 2014 [place], [place].  Conscious Sedation, Adult, Care After Refer to this sheet in the next few weeks. These instructions provide you with information on caring for yourself after your procedure. Your health care provider may also give you more specific instructions. Your treatment has been planned according to current medical practices, but problems sometimes occur. Call your health care provider if you have any problems or questions after your procedure. WHAT TO EXPECT AFTER THE PROCEDURE  After your procedure:  You may feel sleepy, clumsy, and have poor balance for several hours.  Vomiting may occur if you eat too soon after the  procedure. HOME CARE INSTRUCTIONS  Do not participate in any activities where you could become injured for at least 24 hours. Do not:  Drive.  Swim.  Ride a bicycle.  Operate heavy machinery.  Cook.  Use power tools.  Climb ladders.  Work from a high place.  Do not make important decisions or sign legal documents until you are improved.  If you vomit, drink water, juice, or soup when you can drink without vomiting. Make sure you have little or no nausea before eating solid foods.  Only take over-the-counter or prescription medicines for pain, discomfort, or fever as directed by your health care provider.  Make sure you and your family fully understand everything about the medicines given to you, including what side effects may occur.  You should not drink alcohol, take sleeping pills, or take medicines that cause drowsiness for at least 24 hours.  If you smoke, do not smoke without supervision.  If you are feeling better, you may resume normal activities 24 hours after you were sedated.  Keep all appointments with your health care provider. SEEK MEDICAL CARE IF:  Your skin is pale or bluish in color.  You continue to feel nauseous or vomit.  Your pain is getting worse and is not helped by medicine.  You have bleeding or swelling.  You are still sleepy or feeling clumsy after 24 hours. SEEK IMMEDIATE MEDICAL CARE IF:  You develop a rash.  You have difficulty breathing.  You develop  any type of allergic problem.  You have a fever. MAKE SURE YOU:  Understand these instructions.  Will watch your condition.  Will get help right away if you are not doing well or get worse. Document Released: 04/18/2013 Document Reviewed: 02/02/2013 [facility] Patient Information 2014 [place], [place].

## 2013-08-17 NOTE — H&P (Signed)
Chief Complaint: "I'm here for an angiogram"  HPI: Sarah Alvarez is an 63 y.o. female with hx of mesenteric stenosis and intestinal angina. She was recently treated in 12/14 with SMA stent angioplasty. She had a great response to that with significant reduction in her sxs. However, she has now had recurrent abd pain. 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  She has seen Dr. Kathlene Cote in consult and is back today for repeat mesenteric arteriogram with possible stent/angio of SMA and possibly celiac arteries. She did well from the first procedure. She has been well since her consult last week, still with some pain, but no recent fevers, chills, infectious issues. PMHx and meds reviewed. She has continued to take her ASA and Plavix  Past Medical History:  Past Medical History  Diagnosis Date  . Diabetes mellitus   . Staph infection   . Restless leg syndrome   . Chronic mesenteric ischemia     s/p SMA stent 06/22/13  . Coronary artery disease     Past Surgical History:  Past Surgical History  Procedure Laterality Date  . Cholecystectomy    . Abdominal hysterectomy    . Coronary angioplasty      ? LCX stent ~ 2000 in [place], [place] with LCX stent [facility] 09/1999 for reported re-instent stenosis  . Endarterectomy Right 07/19/2013    Procedure: ENDARTERECTOMY CAROTID-RIGHT;  Surgeon: Angelia Mould, MD;  Location: [facility] OR;  Service: Vascular;  Laterality: Right;    Family History:  Family History  Problem Relation Age of Onset  . Diabetes Mother   . Cancer Father   . Diabetes Sister   . Hypertension Sister   . Cancer Brother   . Cancer Maternal Aunt     breast cancer   . Diabetes Maternal Grandmother   . Diabetes Paternal Grandmother     Social History:  reports that she quit smoking about 8 weeks ago. She has never used smokeless tobacco. She reports that she does not drink alcohol or use illicit  drugs.  Allergies: No Known Allergies  Medications:   Medication List    ASK your doctor about these medications       acetaminophen 650 MG CR tablet  Commonly known as:  TYLENOL  Take 650 mg by mouth every 8 (eight) hours as needed for pain.     aspirin 325 MG tablet  Take 1 tablet (325 mg total) by mouth daily.     atorvastatin 20 MG tablet  Commonly known as:  LIPITOR  Take 1 tablet (20 mg total) by mouth daily.     benazepril 5 MG tablet  Commonly known as:  LOTENSIN  Take 10 mg by mouth every morning.     clopidogrel 75 MG tablet  Commonly known as:  PLAVIX  Take 1 tablet (75 mg total) by mouth daily with breakfast.     insulin glargine 100 UNIT/ML injection  Commonly known as:  LANTUS  Inject 0.6 mLs (60 Units total) into the skin at bedtime.     metFORMIN 500 MG (MOD) 24 hr tablet  Commonly known as:  GLUMETZA  Take 1 tablet (500 mg total) by mouth 2 (two) times daily with a meal.     oxyCODONE-acetaminophen 5-325 MG per tablet  Commonly known as:  PERCOCET/ROXICET  Take 1 tablet by mouth every 6 (six) hours as needed for moderate pain.  Please HPI for pertinent positives, otherwise complete 10 system ROS negative.  Physical Exam: BP 158/81  Pulse 89  Temp(Src) 97.1 F (36.2 C) (Oral)  Resp 20  SpO2 100% There is no weight on file to calculate BMI.   General Appearance:  Alert, cooperative, no distress, appears stated age  Head:  Normocephalic, without obvious abnormality, atraumatic  ENT: Unremarkable  Neck: Supple, symmetrical, trachea midline  Lungs:   Clear to auscultation bilaterally, no w/r/r.  Chest Wall:  No tenderness or deformity  Heart:  Regular rate and rhythm, S1, S2 normal, no murmur, rub or gallop.  Abdomen:   Soft, non-tender, non distended.  Extremities: Extremities normal, atraumatic, no cyanosis or edema  Pulses: 2+ and symmetric femoral  Neurologic: Normal affect, no gross deficits.   Results for orders placed during  the hospital encounter of 08/17/13 (from the past 48 hour(s))  CBC     Status: Abnormal   Collection Time    08/17/13  8:30 AM      Result Value Range   WBC 6.1  4.0 - 10.5 K/uL   RBC 3.65 (*) 3.87 - 5.11 MIL/uL   Hemoglobin 11.7 (*) 12.0 - 15.0 g/dL   HCT 34.5 (*) 36.0 - 46.0 %   MCV 94.5  78.0 - 100.0 fL   MCH 32.1  26.0 - 34.0 pg   MCHC 33.9  30.0 - 36.0 g/dL   RDW 13.0  11.5 - 15.5 %   Platelets 286  150 - 400 K/uL   No results found.  Assessment/Plan SMA stenosis Intestinal angina Reviewed procedure of angiogram and angioplasty. Explained procedure, risks, complications, use of sedation. Plan for discharge after recovery time today. Do not expect need for admission. Labs pending Consent signed in chart   [name] PA-C 08/17/2013, 8:45 AM

## 2013-08-17 NOTE — ED Notes (Signed)
LR infusion rate decreased. Total LR given thus far 25ml. Artiral pressure 111/85. Aortic pressure 160/53. Fluid infusion rate stopped. Pt. Alert, commented that her R arm continually gives false BP reading. Unable to change BP cuff during procedure.  Will continue to follow trend.

## 2013-08-17 NOTE — H&P (Signed)
Agree.  Patient seen and examined.  For mesenteric arteriography with possible intervention of SMA and/or celiac axis today.

## 2013-08-17 NOTE — ED Notes (Signed)
Pain med given per verbal MD order for pt. Comfort during procedure prep.

## 2013-08-17 NOTE — ED Notes (Signed)
BP cuff placed on L arm.

## 2013-08-17 NOTE — ED Notes (Signed)
NS fluid bolus in progress.

## 2013-08-17 NOTE — Procedures (Signed)
Procedure:  Visceral arteriography of SMA and celiac axis with stent placement in SMA and celiac axis Findings:  Origin stenosis of SMA extending into previously placed stent.  Treated with placement of new overlapping 7 x 18 mm Palmaz Blue stent. 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  Treated with 54mm PTA followed by placement of 6 x 18 mm Palmaz Blue stent. Both vessels widely patent after stent placement. Plan:  4 hour bedrest and recovery.

## 2013-09-03 ENCOUNTER — Other Ambulatory Visit (HOSPITAL_COMMUNITY): Payer: Self-pay | Admitting: Interventional Radiology

## 2013-09-03 DIAGNOSIS — I771 Stricture of artery: Principal | ICD-10-CM

## 2013-09-03 DIAGNOSIS — K551 Chronic vascular disorders of intestine: Secondary | ICD-10-CM

## 2013-09-03 DIAGNOSIS — R109 Unspecified abdominal pain: Secondary | ICD-10-CM

## 2013-09-04 ENCOUNTER — Inpatient Hospital Stay: Admission: RE | Admit: 2013-09-04 | Payer: No Typology Code available for payment source | Source: Ambulatory Visit

## 2013-09-04 ENCOUNTER — Ambulatory Visit
Admission: RE | Admit: 2013-09-04 | Discharge: 2013-09-04 | Disposition: A | Discharge status: Home / Self Care | Payer: Medicaid Other | Source: Ambulatory Visit | Attending: Interventional Radiology | Admitting: Interventional Radiology

## 2013-09-04 DIAGNOSIS — K551 Chronic vascular disorders of intestine: Secondary | ICD-10-CM

## 2013-09-04 DIAGNOSIS — R109 Unspecified abdominal pain: Secondary | ICD-10-CM

## 2013-09-04 DIAGNOSIS — I771 Stricture of artery: Principal | ICD-10-CM

## 2013-09-04 NOTE — Progress Notes (Signed)
Patient states that she has been experiencing constant abdominal discomfort which radiates to Right flank and lower back.   Has been taking Oxycodone for pain.  States that she only has 1 tablet remaining.   Appetite:  Fair.  Several episodes of nausea & vomiting last week.  Diarrhea on Sunday.  Sleep:  Poor, secondary to discomfort.  Per Dr Annamaria Boots, patient to schedule app't with her primary care MD.  Reece Levy, RN 09/04/2013 10:39 AM

## 2013-09-13 ENCOUNTER — Other Ambulatory Visit: Payer: Self-pay | Admitting: Pharmacist

## 2013-09-13 MED ORDER — CLOPIDOGREL BISULFATE 75 MG PO TABS: 75.0000 mg | ORAL_TABLET | Freq: Every day | ORAL | Status: DC

## 2013-09-14 ENCOUNTER — Ambulatory Visit: Payer: No Typology Code available for payment source | Attending: Internal Medicine | Admitting: Internal Medicine

## 2013-09-14 VITALS — BP 179/83 | HR 97 | Temp 98.0°F | Resp 16 | Ht 63.0 in | Wt 143.0 lb

## 2013-09-14 DIAGNOSIS — E119 Type 2 diabetes mellitus without complications: Secondary | ICD-10-CM

## 2013-09-14 DIAGNOSIS — J069 Acute upper respiratory infection, unspecified: Secondary | ICD-10-CM

## 2013-09-14 DIAGNOSIS — S335XXA Sprain of ligaments of lumbar spine, initial encounter: Secondary | ICD-10-CM

## 2013-09-14 DIAGNOSIS — B3749 Other urogenital candidiasis: Secondary | ICD-10-CM

## 2013-09-14 LAB — POCT URINALYSIS DIPSTICK
Bilirubin, UA: NEGATIVE
Glucose, UA: NEGATIVE
KETONES UA: NEGATIVE
Leukocytes, UA: NEGATIVE
Nitrite, UA: NEGATIVE
PH UA: 7
PROTEIN UA: 30
RBC UA: NEGATIVE
SPEC GRAV UA: 1.02
UROBILINOGEN UA: 0.2

## 2013-09-14 LAB — GLUCOSE, POCT (MANUAL RESULT ENTRY): POC Glucose: 134 mg/dl — AB (ref 70–99)

## 2013-09-14 MED ORDER — FAMOTIDINE 20 MG PO TABS: 20.0000 mg | ORAL_TABLET | Freq: Two times a day (BID) | ORAL | Status: DC

## 2013-09-14 MED ORDER — NAPROXEN 500 MG PO TABS: 500.0000 mg | ORAL_TABLET | Freq: Two times a day (BID) | ORAL | Status: AC

## 2013-09-14 MED ORDER — DICLOFENAC SODIUM 1 % TD GEL: 4.0000 g | Freq: Four times a day (QID) | TRANSDERMAL | Status: DC

## 2013-09-14 MED ORDER — OXYMETAZOLINE HCL 0.05 % NA SOLN: 1.0000 | Freq: Two times a day (BID) | NASAL | Status: DC | PRN

## 2013-09-14 MED ORDER — LORATADINE 10 MG PO TABS: 10.0000 mg | ORAL_TABLET | Freq: Every day | ORAL | Status: DC | PRN

## 2013-09-14 NOTE — Progress Notes (Unsigned)
Patient ID: Sarah Alvarez, female   DOB: 07-16-1950, 63 y.o.   MRN: [id number]   HPI: Sarah Alvarez is a 62 y.o. female presenting on 09/14/2013 with PMH as below here for lower back pain "in the middle". Uses a heating pad and tylenol. No injury. Pain does not radiate down legs- occurred suddenly. Has been coughing and sneezing a lot. Right ear "stopped up"    Past Medical History  Diagnosis Date  . Diabetes mellitus   . Staph infection   . Restless leg syndrome   . Chronic mesenteric ischemia     s/p SMA stent 06/22/13  . Coronary artery disease     Past Surgical History  Procedure Laterality Date  . Cholecystectomy    . Abdominal hysterectomy    . Coronary angioplasty      ? LCX stent ~ 2000 in [place], [place] with LCX stent [facility] 09/1999 for reported re-instent stenosis  . Endarterectomy Right 07/19/2013    Procedure: ENDARTERECTOMY CAROTID-RIGHT;  Surgeon: Angelia Mould, MD;  Location: [facility] OR;  Service: Vascular;  Laterality: Right;    Current Outpatient Prescriptions  Medication Sig Dispense Refill  . acetaminophen (TYLENOL) 650 MG CR tablet Take 650 mg by mouth every 8 (eight) hours as needed for pain.      Marland Kitchen aspirin 325 MG tablet Take 1 tablet (325 mg total) by mouth daily.      Marland Kitchen atorvastatin (LIPITOR) 20 MG tablet Take 1 tablet (20 mg total) by mouth daily.  30 tablet  5  . benazepril (LOTENSIN) 5 MG tablet Take 10 mg by mouth every morning.      . clopidogrel (PLAVIX) 75 MG tablet Take 1 tablet (75 mg total) by mouth daily with breakfast.  30 tablet  3  . glipiZIDE (GLUCOTROL) 5 MG tablet Take 5 mg by mouth 2 (two) times daily before a meal.      . insulin glargine (LANTUS) 100 UNIT/ML injection Inject 0.6 mLs (60 Units total) into the skin at bedtime.  60 mL  1 year  . metFORMIN (GLUMETZA) 500 MG (MOD) 24 hr tablet Take 1 tablet (500 mg total) by mouth 2 (two) times daily with a meal.  60 tablet  2  . Vitamin D, Ergocalciferol, (DRISDOL) 50000 UNITS CAPS capsule Take  50,000 Units by mouth every 7 (seven) days.      . [DISCONTINUED] colchicine 0.6 MG tablet Take two tab PO, then take 1 tab PO 1 hour later  6 tablet  0   No current facility-administered medications for this visit.    No Known Allergies  Family History  Problem Relation Age of Onset  . Diabetes Mother   . Cancer Father   . Diabetes Sister   . Hypertension Sister   . Cancer Brother   . Cancer Maternal Aunt     breast cancer   . Diabetes Maternal Grandmother   . Diabetes Paternal Grandmother     History   Social History  . Marital Status: Divorced    Spouse Name: N/A    Number of Children: N/A  . Years of Education: N/A   Occupational History  . Not on file.   Social History Main Topics  . Smoking status: Former Smoker -- 1.00 packs/day for 20 years    Quit date: 06/21/2013  . Smokeless tobacco: Never Used  . Alcohol Use: No  . Drug Use: No  . Sexual Activity: No   Other Topics Concern  . Not on file  Social History Narrative  . No narrative on file    Review of Systems  Review of Systems  Constitutional: Negative for fever, chills, diaphoresis, activity change, appetite change and fatigue.  HENT: Negative for ear pain, nosebleeds, congestion, facial swelling, rhinorrhea, neck pain, neck stiffness and ear discharge.  Eyes: Negative for pain, discharge, redness, itching and visual disturbance.  Respiratory: Negative for cough, choking, chest tightness, shortness of breath, wheezing and stridor.  Cardiovascular: Negative for chest pain, palpitations and leg swelling.  Gastrointestinal: Negative for abdominal distention, vomiting, diarrhea or consitpation Genitourinary: Negative for dysuria, urgency, frequency, hematuria, flank pain, decreased urine volume, difficulty urinating and dyspareunia.  Musculoskeletal: + for back pain, joint swelling, arthralgias or gait problem.  Neurological: Negative for dizziness, tremors, seizures, syncope, facial asymmetry,  speech difficulty, weakness, light-headedness, numbness and headaches.  Hematological: Negative for adenopathy. Does not bruise/bleed easily.  Psychiatric/Behavioral: Negative for hallucinations, behavioral problems, confusion, dysphoric mood   Objective:  BP 179/83  Pulse 97  Temp(Src) 98 F (36.7 C) (Oral)  Resp 16  Ht 5\' 3"  (1.6 m)  Wt 143 lb (64.864 kg)  BMI 25.34 kg/m2  SpO2 99% Filed Weights   09/14/13 1539  Weight: 143 lb (64.864 kg)     Physical Exam  Constitutional: Appears well-developed and well-nourished. No distress. HENT: Normocephalic. External right and left ear normal. Oropharynx is clear and moist.  Eyes: Conjunctivae and EOM are normal. PERRLA, no scleral icterus.  Neck: Normal ROM. Neck supple. No JVD. No tracheal deviation. No thyromegaly.  CVS: RRR, S1/S2 +, + SEM 2/6 at right upper sternal border, no gallops, no carotid bruit.  Pulmonary: Effort and breath sounds normal, no stridor, rhonchi, wheezes, rales.  Abdominal: Soft. BS +,  no distension, tenderness, rebound or guarding.  Musculoskeletal: Normal range of motion. No edema - tender in Lumbar spine and right flank- .  Neuro: Alert. Normal reflexes, muscle tone coordination. No cranial nerve deficit. Skin: Skin is warm and dry. No rash noted. Not diaphoretic. No erythema. No pallor.  Psychiatric: Normal mood and affect. Behavior, judgment, thought content normal.   Lab Results  Component Value Date   WBC 6.1 08/17/2013   HGB 11.7* 08/17/2013   HCT 34.5* 08/17/2013   MCV 94.5 08/17/2013   PLT 286 08/17/2013   Lab Results  Component Value Date   CREATININE 0.48* 08/17/2013   BUN 12 08/17/2013   NA 140 08/17/2013   K 3.9 08/17/2013   CL 104 08/17/2013   CO2 23 08/17/2013    Lab Results  Component Value Date   HGBA1C 7.3* 07/19/2013   Lipid Panel     Component Value Date/Time   CHOL 124 08/13/2013 0856   TRIG 105 08/13/2013 0856   HDL 38* 08/13/2013 0856   CHOLHDL 3.3 08/13/2013 0856   VLDL 21 08/13/2013 0856    LDLCALC 65 08/13/2013 0856        Patient Active Problem List   Diagnosis Date Noted  . Occlusion and stenosis of carotid artery without mention of cerebral infarction 08/01/2013  . Carotid stenosis 07/19/2013  . Shock 07/19/2013  . Carotid bruit 06/21/2013  . Chronic mesenteric ischemia 06/21/2013  . Nausea and vomiting 06/20/2013  . Dehydration 06/19/2013  . Nausea & vomiting 06/19/2013  . Gastroparesis 06/19/2013  . DM (diabetes mellitus) 06/12/2013  . Essential hypertension, benign 06/12/2013  . Other and unspecified hyperlipidemia 06/12/2013  . History of UTI 06/12/2013     Preventative Medicine:  Health Maintenance  Topic Date Due  .  Pap Smear  02/11/1969  . Tetanus/tdap  02/11/1970  . Colonoscopy  02/11/2001  . Mammogram  02/04/2011  . Zostavax  02/12/2011  . Influenza Vaccine  02/09/2014    Adult vaccines due  Topic Date Due  . Tetanus/tdap  02/11/1970  . Zostavax  02/12/2011       Assessment and plan:  Lumbar sprain - Voltaren gel, Naproxen and Pepcid for GI protection - lumbar films to r/o compression fx  HTN - suspect this is because of pain- will not order medictions  Upper resp infection - PRN Afrin and Claritin  Murmur - aortic sclerosis noted on ECHO in 2010  Return in about 6 weeks (around 10/26/2013).   The patient was given clear instructions to go to ER or return to medical center if symptoms don't improve, worsen or new problems develop. The patient verbalized understanding. The patient was told to call to get lab results if they haven't heard anything in the next week.     Debbe Odea, MD

## 2013-09-14 NOTE — Patient Instructions (Signed)

## 2013-09-14 NOTE — Progress Notes (Unsigned)
Pt is here following up on her diabetes and HTN Pt reports that she may have a UTI.

## 2013-09-21 ENCOUNTER — Ambulatory Visit (HOSPITAL_COMMUNITY)
Admission: RE | Admit: 2013-09-21 | Discharge: 2013-09-21 | Disposition: A | Discharge status: Home / Self Care | Payer: No Typology Code available for payment source | Source: Ambulatory Visit | Attending: Internal Medicine | Admitting: Internal Medicine

## 2013-09-21 DIAGNOSIS — M47817 Spondylosis without myelopathy or radiculopathy, lumbosacral region: Secondary | ICD-10-CM | POA: Insufficient documentation

## 2013-09-21 DIAGNOSIS — S335XXA Sprain of ligaments of lumbar spine, initial encounter: Secondary | ICD-10-CM

## 2013-09-27 ENCOUNTER — Ambulatory Visit: Payer: No Typology Code available for payment source | Attending: Internal Medicine | Admitting: Internal Medicine

## 2013-09-27 ENCOUNTER — Telehealth: Payer: Self-pay | Admitting: *Deleted

## 2013-09-27 VITALS — BP 126/76 | HR 98 | Resp 16

## 2013-09-27 DIAGNOSIS — I10 Essential (primary) hypertension: Secondary | ICD-10-CM | POA: Insufficient documentation

## 2013-09-27 DIAGNOSIS — Z87891 Personal history of nicotine dependence: Secondary | ICD-10-CM | POA: Insufficient documentation

## 2013-09-27 DIAGNOSIS — Z7982 Long term (current) use of aspirin: Secondary | ICD-10-CM | POA: Insufficient documentation

## 2013-09-27 DIAGNOSIS — I2510 Atherosclerotic heart disease of native coronary artery without angina pectoris: Secondary | ICD-10-CM | POA: Insufficient documentation

## 2013-09-27 DIAGNOSIS — M545 Low back pain, unspecified: Secondary | ICD-10-CM | POA: Insufficient documentation

## 2013-09-27 DIAGNOSIS — I774 Celiac artery compression syndrome: Secondary | ICD-10-CM | POA: Insufficient documentation

## 2013-09-27 DIAGNOSIS — G2581 Restless legs syndrome: Secondary | ICD-10-CM | POA: Insufficient documentation

## 2013-09-27 DIAGNOSIS — Z79899 Other long term (current) drug therapy: Secondary | ICD-10-CM | POA: Insufficient documentation

## 2013-09-27 DIAGNOSIS — I6529 Occlusion and stenosis of unspecified carotid artery: Secondary | ICD-10-CM | POA: Insufficient documentation

## 2013-09-27 DIAGNOSIS — E785 Hyperlipidemia, unspecified: Secondary | ICD-10-CM | POA: Insufficient documentation

## 2013-09-27 DIAGNOSIS — Z794 Long term (current) use of insulin: Secondary | ICD-10-CM | POA: Insufficient documentation

## 2013-09-27 DIAGNOSIS — M549 Dorsalgia, unspecified: Secondary | ICD-10-CM

## 2013-09-27 DIAGNOSIS — E119 Type 2 diabetes mellitus without complications: Secondary | ICD-10-CM | POA: Insufficient documentation

## 2013-09-27 MED ORDER — TRAMADOL HCL 50 MG PO TABS: 50.0000 mg | ORAL_TABLET | Freq: Three times a day (TID) | ORAL | Status: DC | PRN

## 2013-09-27 MED ORDER — GABAPENTIN 300 MG PO CAPS
300.0000 mg | ORAL_CAPSULE | Freq: Every day | ORAL | Status: DC
Start: 2013-09-27 — End: 2013-10-23

## 2013-09-27 MED ORDER — CYCLOBENZAPRINE HCL 5 MG PO TABS: 5.0000 mg | ORAL_TABLET | Freq: Three times a day (TID) | ORAL | Status: DC | PRN

## 2013-09-27 NOTE — Telephone Encounter (Signed)
Message copied by Joan Mayans on Thu Sep 27, 2013  2:04 PM ------      Message from: Debbe Odea      Created: Thu Sep 27, 2013  9:53 AM       Please ask pt to come back (walk in) and assess her back pain. If tender at L1, will need MRI to assess for acuteness of fracture. ------

## 2013-09-27 NOTE — Progress Notes (Signed)
Patient ID: Sarah Alvarez, female   DOB: 11-06-1950, 63 y.o.   MRN: [id number]   CC:  HPI: Patient seen as an acute visit after being evaluated by Dr. Wynelle Cleveland, for back pain. She had seen her on 3/6 and mentioned pain in her lower back. She has been using a heating pad and Tylenol. Pain does not radiate anywhere. Denies any stool urinary incontinence, pain is aggravated by bending, laying flat in bed.    No Known Allergies Past Medical History  Diagnosis Date  . Diabetes mellitus   . Staph infection   . Restless leg syndrome   . Chronic mesenteric ischemia     s/p SMA stent 06/22/13  . Coronary artery disease    Current Outpatient Prescriptions on File Prior to Visit  Medication Sig Dispense Refill  . acetaminophen (TYLENOL) 650 MG CR tablet Take 650 mg by mouth every 8 (eight) hours as needed for pain.      Marland Kitchen aspirin 325 MG tablet Take 1 tablet (325 mg total) by mouth daily.      Marland Kitchen atorvastatin (LIPITOR) 20 MG tablet Take 1 tablet (20 mg total) by mouth daily.  30 tablet  5  . benazepril (LOTENSIN) 5 MG tablet Take 10 mg by mouth every morning.      . clopidogrel (PLAVIX) 75 MG tablet Take 1 tablet (75 mg total) by mouth daily with breakfast.  30 tablet  3  . diclofenac sodium (VOLTAREN) 1 % GEL Apply 4 g topically 4 (four) times daily.  100 g  1  . famotidine (PEPCID) 20 MG tablet Take 1 tablet (20 mg total) by mouth 2 (two) times daily.  60 tablet  0  . glipiZIDE (GLUCOTROL) 5 MG tablet Take 5 mg by mouth 2 (two) times daily before a meal.      . insulin glargine (LANTUS) 100 UNIT/ML injection Inject 0.6 mLs (60 Units total) into the skin at bedtime.  60 mL  1 year  . loratadine (CLARITIN) 10 MG tablet Take 1 tablet (10 mg total) by mouth daily as needed for allergies or rhinitis.  30 tablet  11  . metFORMIN (GLUMETZA) 500 MG (MOD) 24 hr tablet Take 1 tablet (500 mg total) by mouth 2 (two) times daily with a meal.  60 tablet  2  . oxyCODONE-acetaminophen (PERCOCET/ROXICET) 5-325 MG  per tablet Take 1 tablet by mouth every 6 (six) hours as needed for moderate pain.  30 tablet  0  . oxymetazoline (AFRIN NASAL SPRAY) 0.05 % nasal spray Place 1 spray into both nostrils 2 (two) times daily as needed for congestion.  30 mL  0  . Vitamin D, Ergocalciferol, (DRISDOL) 50000 UNITS CAPS capsule Take 50,000 Units by mouth every 7 (seven) days.      . [DISCONTINUED] colchicine 0.6 MG tablet Take two tab PO, then take 1 tab PO 1 hour later  6 tablet  0   No current facility-administered medications on file prior to visit.   Family History  Problem Relation Age of Onset  . Diabetes Mother   . Cancer Father   . Diabetes Sister   . Hypertension Sister   . Cancer Brother   . Cancer Maternal Aunt     breast cancer   . Diabetes Maternal Grandmother   . Diabetes Paternal Grandmother    History   Social History  . Marital Status: Divorced    Spouse Name: N/A    Number of Children: N/A  . Years of Education: N/A  Occupational History  . Not on file.   Social History Main Topics  . Smoking status: Former Smoker -- 1.00 packs/day for 20 years    Quit date: 06/21/2013  . Smokeless tobacco: Never Used  . Alcohol Use: No  . Drug Use: No  . Sexual Activity: No   Other Topics Concern  . Not on file   Social History Narrative  . No narrative on file    Review of Systems  Constitutional: Negative for fever, chills, diaphoresis, activity change, appetite change and fatigue.  HENT: Negative for ear pain, nosebleeds, congestion, facial swelling, rhinorrhea, neck pain, neck stiffness and ear discharge.   Eyes: Negative for pain, discharge, redness, itching and visual disturbance.  Respiratory: Negative for cough, choking, chest tightness, shortness of breath, wheezing and stridor.   Cardiovascular: Negative for chest pain, palpitations and leg swelling.  Gastrointestinal: Negative for abdominal distention.  Genitourinary: Negative for dysuria, urgency, frequency, hematuria,  flank pain, decreased urine volume, difficulty urinating and dyspareunia.  Musculoskeletal: As in history of present illness  Neurological: Negative for dizziness, tremors, seizures, syncope, facial asymmetry, speech difficulty, weakness, light-headedness, numbness and headaches.  Hematological: Negative for adenopathy. Does not bruise/bleed easily.  Psychiatric/Behavioral: Negative for hallucinations, behavioral problems, confusion, dysphoric mood, decreased concentration and agitation.    Objective:   Filed Vitals:   09/27/13 1424  BP: 126/76  Pulse: 98  Resp: 16    Physical Exam  Constitutional: Appears well-developed and well-nourished. No distress.  HENT: Normocephalic. External right and left ear normal. Oropharynx is clear and moist.  Eyes: Conjunctivae and EOM are normal. PERRLA, no scleral icterus.  Neck: Normal ROM. Neck supple. No JVD. No tracheal deviation. No thyromegaly.  CVS: RRR, S1/S2 +, no murmurs, no gallops, no carotid bruit.  Pulmonary: Effort and breath sounds normal, no stridor, rhonchi, wheezes, rales.  Abdominal: Soft. BS +,  no distension, tenderness, rebound or guarding.  Musculoskeletal: Normal range of motion. No edema and no tenderness.  Lymphadenopathy: No lymphadenopathy noted, cervical, inguinal. Neuro: Alert. Normal reflexes, muscle tone coordination. No cranial nerve deficit. Skin: Skin is warm and dry. No rash noted. Not diaphoretic. No erythema. No pallor.  Psychiatric: Normal mood and affect. Behavior, judgment, thought content normal.   Lab Results  Component Value Date   WBC 6.1 08/17/2013   HGB 11.7* 08/17/2013   HCT 34.5* 08/17/2013   MCV 94.5 08/17/2013   PLT 286 08/17/2013   Lab Results  Component Value Date   CREATININE 0.48* 08/17/2013   BUN 12 08/17/2013   NA 140 08/17/2013   K 3.9 08/17/2013   CL 104 08/17/2013   CO2 23 08/17/2013    Lab Results  Component Value Date   HGBA1C 7.3* 07/19/2013   Lipid Panel     Component Value Date/Time    CHOL 124 08/13/2013 0856   TRIG 105 08/13/2013 0856   HDL 38* 08/13/2013 0856   CHOLHDL 3.3 08/13/2013 0856   VLDL 21 08/13/2013 0856   LDLCALC 65 08/13/2013 0856       Assessment and plan:   Patient Active Problem List   Diagnosis Date Noted  . Occlusion and stenosis of carotid artery without mention of cerebral infarction 08/01/2013  . Carotid stenosis 07/19/2013  . Shock 07/19/2013  . Carotid bruit 06/21/2013  . Chronic mesenteric ischemia 06/21/2013  . Nausea and vomiting 06/20/2013  . Dehydration 06/19/2013  . Nausea & vomiting 06/19/2013  . Gastroparesis 06/19/2013  . DM (diabetes mellitus) 06/12/2013  . Essential hypertension, benign  06/12/2013  . Other and unspecified hyperlipidemia 06/12/2013  . History of UTI 06/12/2013   Suspected L1 compression fracture on the x-ray on 3/6 Start the patient on Flexeril, gabapentin tramadol    MRI of the lumbar spine If no other pathology the patient will be referred to interventional radiology for possible vertebroplasty Patient will follow back up with [place] in 2-3 weeks  The patient was given clear instructions to go to ER or return to medical center if symptoms don't improve, worsen or new problems develop. The patient verbalized understanding. The patient was told to call to get any lab results if not heard anything in the next week.

## 2013-09-27 NOTE — Progress Notes (Signed)
Pt came in because of MRI results.

## 2013-09-27 NOTE — Telephone Encounter (Signed)
I spoke to the pt and told her about her MRI results. She said that she would come in today for council.

## 2013-09-27 NOTE — Progress Notes (Signed)
Quick Note:  Please ask pt to come back (walk in) and assess her back pain. If tender at L1, will need MRI to assess for acuteness of fracture. ______

## 2013-10-05 ENCOUNTER — Ambulatory Visit: Payer: Self-pay | Admitting: Internal Medicine

## 2013-10-16 ENCOUNTER — Ambulatory Visit (HOSPITAL_COMMUNITY)
Admission: RE | Admit: 2013-10-16 | Discharge: 2013-10-16 | Disposition: A | Discharge status: Home / Self Care | Payer: Medicaid Other | Source: Ambulatory Visit | Attending: Internal Medicine | Admitting: Internal Medicine

## 2013-10-16 DIAGNOSIS — M549 Dorsalgia, unspecified: Secondary | ICD-10-CM | POA: Insufficient documentation

## 2013-10-22 ENCOUNTER — Telehealth: Payer: Self-pay | Admitting: Emergency Medicine

## 2013-10-22 DIAGNOSIS — IMO0002 Reserved for concepts with insufficient information to code with codable children: Secondary | ICD-10-CM

## 2013-10-22 NOTE — Telephone Encounter (Signed)
Message copied by Ricci Barker on Mon Oct 22, 2013  5:59 PM ------      Message from: Allyson Sabal MD, [facility]      Created: Fri Oct 19, 2013  3:43 PM       Notify patient and the patient has mild disc bulging at L4-L5 and T11-T12. Please provide neurosurgery referral.       ------

## 2013-10-22 NOTE — Telephone Encounter (Signed)
Pt given MRI results with neurosurgery referral

## 2013-10-23 ENCOUNTER — Encounter: Payer: Self-pay | Admitting: Internal Medicine

## 2013-10-23 ENCOUNTER — Ambulatory Visit: Payer: No Typology Code available for payment source | Attending: Internal Medicine | Admitting: Internal Medicine

## 2013-10-23 VITALS — BP 116/65 | HR 74 | Temp 98.8°F | Resp 16

## 2013-10-23 DIAGNOSIS — E785 Hyperlipidemia, unspecified: Secondary | ICD-10-CM

## 2013-10-23 DIAGNOSIS — H6120 Impacted cerumen, unspecified ear: Secondary | ICD-10-CM | POA: Insufficient documentation

## 2013-10-23 DIAGNOSIS — G8929 Other chronic pain: Secondary | ICD-10-CM | POA: Insufficient documentation

## 2013-10-23 DIAGNOSIS — M545 Low back pain, unspecified: Secondary | ICD-10-CM | POA: Insufficient documentation

## 2013-10-23 DIAGNOSIS — E119 Type 2 diabetes mellitus without complications: Secondary | ICD-10-CM | POA: Insufficient documentation

## 2013-10-23 DIAGNOSIS — I10 Essential (primary) hypertension: Secondary | ICD-10-CM

## 2013-10-23 LAB — POCT GLYCOSYLATED HEMOGLOBIN (HGB A1C): Hemoglobin A1C: 6.6

## 2013-10-23 LAB — GLUCOSE, POCT (MANUAL RESULT ENTRY): POC Glucose: 172 mg/dl — AB (ref 70–99)

## 2013-10-23 MED ORDER — CYCLOBENZAPRINE HCL 5 MG PO TABS: 5.0000 mg | ORAL_TABLET | Freq: Three times a day (TID) | ORAL | Status: DC | PRN

## 2013-10-23 MED ORDER — INSULIN GLARGINE 100 UNIT/ML ~~LOC~~ SOLN: 60.0000 [IU] | Freq: Every day | SUBCUTANEOUS | Status: DC

## 2013-10-23 MED ORDER — ATORVASTATIN CALCIUM 20 MG PO TABS: 20.0000 mg | ORAL_TABLET | Freq: Every day | ORAL | Status: DC

## 2013-10-23 MED ORDER — GABAPENTIN 300 MG PO CAPS: 300.0000 mg | ORAL_CAPSULE | Freq: Every day | ORAL | Status: DC

## 2013-10-23 MED ORDER — METFORMIN HCL ER (MOD) 500 MG PO TB24: 500.0000 mg | ORAL_TABLET | Freq: Two times a day (BID) | ORAL | Status: DC

## 2013-10-23 MED ORDER — CARBAMIDE PEROXIDE 6.5 % OT SOLN: 5.0000 [drp] | Freq: Two times a day (BID) | OTIC | Status: DC

## 2013-10-23 NOTE — Progress Notes (Signed)
Patient here for follow up on her diabetes Needs medication refill

## 2013-10-23 NOTE — Progress Notes (Signed)
MRN: [id number] Name: Sarah Alvarez  Sex: female Age: 63 y.o. DOB: 12/04/1950  Allergies: Review of patient's allergies indicates no known allergies.  Chief Complaint  Patient presents with  . Follow-up    HPI: Patient is 63 y.o. female who history of diabetes hypertension hyperlipidemia comes today for followup her, she reported her to have occasional hypoglycemic symptoms when her sugar drops to less than 70 mg/dL, she has been taking Lantus 60 units as well as metformin 500 mg twice a day, patient is requesting refill on her medications also she reported to have lot of problem in the right ear hearing, denies any discharge, she also has a back pain had MRI done reported to have disc bulging patient has already been referred to neurosurgery.   Past Medical History  Diagnosis Date  . Diabetes mellitus   . Staph infection   . Restless leg syndrome   . Chronic mesenteric ischemia     s/p SMA stent 06/22/13  . Coronary artery disease     Past Surgical History  Procedure Laterality Date  . Cholecystectomy    . Abdominal hysterectomy    . Coronary angioplasty      ? LCX stent ~ 2000 in [place], [place] with LCX stent [facility] 09/1999 for reported re-instent stenosis  . Endarterectomy Right 07/19/2013    Procedure: ENDARTERECTOMY CAROTID-RIGHT;  Surgeon: Angelia Mould, MD;  Location: [facility] OR;  Service: Vascular;  Laterality: Right;      Medication List       This list is accurate as of: 10/23/13 12:42 PM.  Always use your most recent med list.               acetaminophen 650 MG CR tablet  Commonly known as:  TYLENOL  Take 650 mg by mouth every 8 (eight) hours as needed for pain.     aspirin 325 MG tablet  Take 1 tablet (325 mg total) by mouth daily.     atorvastatin 20 MG tablet  Commonly known as:  LIPITOR  Take 1 tablet (20 mg total) by mouth daily.     benazepril 5 MG tablet  Commonly known as:  LOTENSIN  Take 10 mg by mouth every morning.     carbamide  peroxide 6.5 % otic solution  Commonly known as:  DEBROX  Place 5 drops into both ears 2 (two) times daily.     clopidogrel 75 MG tablet  Commonly known as:  PLAVIX  Take 1 tablet (75 mg total) by mouth daily with breakfast.     cyclobenzaprine 5 MG tablet  Commonly known as:  FLEXERIL  Take 1 tablet (5 mg total) by mouth 3 (three) times daily as needed for muscle spasms.     diclofenac sodium 1 % Gel  Commonly known as:  VOLTAREN  Apply 4 g topically 4 (four) times daily.     famotidine 20 MG tablet  Commonly known as:  PEPCID  Take 1 tablet (20 mg total) by mouth 2 (two) times daily.     gabapentin 300 MG capsule  Commonly known as:  NEURONTIN  Take 1 capsule (300 mg total) by mouth at bedtime.     glipiZIDE 5 MG tablet  Commonly known as:  GLUCOTROL  Take 5 mg by mouth 2 (two) times daily before a meal.     insulin glargine 100 UNIT/ML injection  Commonly known as:  LANTUS  Inject 0.6 mLs (60 Units total) into the skin at bedtime.  loratadine 10 MG tablet  Commonly known as:  CLARITIN  Take 1 tablet (10 mg total) by mouth daily as needed for allergies or rhinitis.     metFORMIN 500 MG (MOD) 24 hr tablet  Commonly known as:  GLUMETZA  Take 1 tablet (500 mg total) by mouth 2 (two) times daily with a meal.     oxyCODONE-acetaminophen 5-325 MG per tablet  Commonly known as:  PERCOCET/ROXICET  Take 1 tablet by mouth every 6 (six) hours as needed for moderate pain.     oxymetazoline 0.05 % nasal spray  Commonly known as:  AFRIN NASAL SPRAY  Place 1 spray into both nostrils 2 (two) times daily as needed for congestion.     traMADol 50 MG tablet  Commonly known as:  ULTRAM  Take 1 tablet (50 mg total) by mouth every 8 (eight) hours as needed.     Vitamin D (Ergocalciferol) 50000 UNITS Caps capsule  Commonly known as:  DRISDOL  Take 50,000 Units by mouth every 7 (seven) days.        Meds ordered this encounter  Medications  . metFORMIN (GLUMETZA) 500 MG (MOD)  24 hr tablet    Sig: Take 1 tablet (500 mg total) by mouth 2 (two) times daily with a meal.    Dispense:  60 tablet    Refill:  2  . cyclobenzaprine (FLEXERIL) 5 MG tablet    Sig: Take 1 tablet (5 mg total) by mouth 3 (three) times daily as needed for muscle spasms.    Dispense:  60 tablet    Refill:  1  . insulin glargine (LANTUS) 100 UNIT/ML injection    Sig: Inject 0.6 mLs (60 Units total) into the skin at bedtime.    Dispense:  60 mL    Refill:  1 year  . gabapentin (NEURONTIN) 300 MG capsule    Sig: Take 1 capsule (300 mg total) by mouth at bedtime.    Dispense:  30 capsule    Refill:  1  . atorvastatin (LIPITOR) 20 MG tablet    Sig: Take 1 tablet (20 mg total) by mouth daily.    Dispense:  30 tablet    Refill:  5  . carbamide peroxide (DEBROX) 6.5 % otic solution    Sig: Place 5 drops into both ears 2 (two) times daily.    Dispense:  15 mL    Refill:  1    Immunization History  Administered Date(s) Administered  . Influenza Split 06/05/2012  . Influenza,inj,Quad PF,36+ Mos 06/12/2013, 06/21/2013  . Pneumococcal Polysaccharide-23 06/21/2013    Family History  Problem Relation Age of Onset  . Diabetes Mother   . Cancer Father   . Diabetes Sister   . Hypertension Sister   . Cancer Brother   . Cancer Maternal Aunt     breast cancer   . Diabetes Maternal Grandmother   . Diabetes Paternal Grandmother     History  Substance Use Topics  . Smoking status: Former Smoker -- 1.00 packs/day for 20 years    Quit date: 06/21/2013  . Smokeless tobacco: Never Used  . Alcohol Use: No    Review of Systems   As noted in HPI  Filed Vitals:   10/23/13 1224  BP: 116/65  Pulse: 74  Temp: 98.8 F (37.1 C)  Resp: 16    Physical Exam  Physical Exam  Constitutional: No distress.  HENT:  Head: Normocephalic and atraumatic.  Excessive ear wax in right ear  Eyes: EOM  are normal. Pupils are equal, round, and reactive to light.  Cardiovascular: Normal rate and regular  rhythm.   Pulmonary/Chest: Breath sounds normal. No respiratory distress. She has no wheezes. She has no rales.  Musculoskeletal: She exhibits no edema.  Low lumbar paraspinal tenderness    CBC    Component Value Date/Time   WBC 6.1 08/17/2013 0830   RBC 3.65* 08/17/2013 0830   HGB 11.7* 08/17/2013 0830   HCT 34.5* 08/17/2013 0830   PLT 286 08/17/2013 0830   MCV 94.5 08/17/2013 0830   LYMPHSABS 1.3 08/13/2013 0856   MONOABS 0.2 08/13/2013 0856   EOSABS 0.1 08/13/2013 0856   BASOSABS 0.0 08/13/2013 0856    CMP     Component Value Date/Time   NA 140 08/17/2013 0830   K 3.9 08/17/2013 0830   CL 104 08/17/2013 0830   CO2 23 08/17/2013 0830   GLUCOSE 117* 08/17/2013 0830   BUN 12 08/17/2013 0830   CREATININE 0.48* 08/17/2013 0830   CREATININE 0.49* 08/13/2013 0856   CALCIUM 8.8 08/17/2013 0830   PROT 6.4 08/17/2013 0830   ALBUMIN 3.8 08/17/2013 0830   AST 12 08/17/2013 0830   ALT 6 08/17/2013 0830   ALKPHOS 81 08/17/2013 0830   BILITOT <0.2* 08/17/2013 0830   GFRNONAA >90 08/17/2013 0830   GFRNONAA >89 08/13/2013 0856   GFRAA >90 08/17/2013 0830   GFRAA >89 08/13/2013 0856    Lab Results  Component Value Date/Time   CHOL 124 08/13/2013  8:56 AM    No components found with this basename: hga1c    Lab Results  Component Value Date/Time   AST 12 08/17/2013  8:30 AM    Assessment and Plan  DM (diabetes mellitus) - Plan: Glucose (CBG), HgB A1c  Results for orders placed in visit on 10/23/13  GLUCOSE, POCT (MANUAL RESULT ENTRY)      Result Value Ref Range   POC Glucose 172 (*) 70 - 99 mg/dl  POCT GLYCOSYLATED HEMOGLOBIN (HGB A1C)      Result Value Ref Range   Hemoglobin A1C 6.6     diabetes is very well controlled, she will continue with the Lantus 60 units each bedtime, have reduced the dose of metformin to 500 mg daily, advised patient to keep the fingerstick log   , metFORMIN (GLUMETZA) 500 MG (MOD) 24 hr tablet, insulin glargine (LANTUS) 100 UNIT/ML injection  Essential hypertension, benign Well controlled  continue with current medications  Other and unspecified hyperlipidemia - Plan: atorvastatin (LIPITOR) 20 MG tablet repeat lipid panel on next visit her  Chronic low back pain - Plan: cyclobenzaprine (FLEXERIL) 5 MG tablet, gabapentin (NEURONTIN) 300 MG capsule Advised patient to apply heating pad, patient already referred to neurosurgery.  Excessive ear wax - Plan: carbamide peroxide (DEBROX) 6.5 % otic solution  Return in about 3 months (around 01/22/2014) for diabetes, hypertension.  Lorayne Marek, MD

## 2013-11-01 ENCOUNTER — Telehealth: Payer: Self-pay | Admitting: Emergency Medicine

## 2013-11-01 ENCOUNTER — Telehealth: Payer: Self-pay | Admitting: Internal Medicine

## 2013-11-01 NOTE — Telephone Encounter (Signed)
Attempted to return pt call,left message to call when message received

## 2013-11-01 NOTE — Telephone Encounter (Signed)
Pt is requesting refill  Tramadol 50mg . Please contact pt to inform about refill status.  [place]

## 2013-11-05 ENCOUNTER — Other Ambulatory Visit: Payer: Self-pay | Admitting: Emergency Medicine

## 2013-11-06 ENCOUNTER — Other Ambulatory Visit: Payer: Self-pay | Admitting: Internal Medicine

## 2013-11-06 ENCOUNTER — Telehealth: Payer: Self-pay

## 2013-11-06 MED ORDER — TRAMADOL HCL 50 MG PO TABS: 50.0000 mg | ORAL_TABLET | Freq: Three times a day (TID) | ORAL | Status: DC | PRN

## 2013-11-06 NOTE — Telephone Encounter (Signed)
Patient is requesting refill on her tramadol

## 2013-11-06 NOTE — Telephone Encounter (Signed)
Left message on voice mail that her prescription for tramadol is ready for pick up

## 2013-11-06 NOTE — Telephone Encounter (Signed)
Patient may come pick up Rx

## 2013-12-20 ENCOUNTER — Other Ambulatory Visit (HOSPITAL_COMMUNITY): Payer: Self-pay | Admitting: Interventional Radiology

## 2013-12-20 DIAGNOSIS — K551 Chronic vascular disorders of intestine: Secondary | ICD-10-CM

## 2013-12-20 DIAGNOSIS — K559 Vascular disorder of intestine, unspecified: Secondary | ICD-10-CM

## 2013-12-20 DIAGNOSIS — I771 Stricture of artery: Principal | ICD-10-CM

## 2013-12-21 ENCOUNTER — Other Ambulatory Visit: Payer: Self-pay | Admitting: Internal Medicine

## 2013-12-25 ENCOUNTER — Other Ambulatory Visit: Payer: Self-pay | Admitting: Emergency Medicine

## 2013-12-25 DIAGNOSIS — I771 Stricture of artery: Secondary | ICD-10-CM

## 2013-12-25 DIAGNOSIS — K551 Chronic vascular disorders of intestine: Secondary | ICD-10-CM

## 2013-12-27 ENCOUNTER — Telehealth: Payer: Self-pay | Admitting: Internal Medicine

## 2013-12-27 NOTE — Telephone Encounter (Signed)
Pt requests refill for traMADol (ULTRAM) 50 MG tablet. Pl F/U with Pt.

## 2013-12-27 NOTE — Telephone Encounter (Signed)
Pt requesting refill of Tramadol

## 2014-01-01 ENCOUNTER — Telehealth: Payer: Self-pay

## 2014-01-01 MED ORDER — TRAMADOL HCL 50 MG PO TABS: 50.0000 mg | ORAL_TABLET | Freq: Three times a day (TID) | ORAL | Status: DC | PRN

## 2014-01-01 NOTE — Telephone Encounter (Signed)
Patient called requesting a refill on her tramadol Per Dr Jegede-prescription was refilled  Patient is aware the prescription is ready to be picked up And it will be at the front desk

## 2014-01-22 ENCOUNTER — Ambulatory Visit: Payer: Medicaid Other | Attending: Internal Medicine | Admitting: Internal Medicine

## 2014-01-22 ENCOUNTER — Encounter: Payer: Self-pay | Admitting: Internal Medicine

## 2014-01-22 VITALS — BP 117/75 | HR 76 | Temp 98.8°F | Resp 16 | Wt 159.2 lb

## 2014-01-22 DIAGNOSIS — M545 Low back pain, unspecified: Secondary | ICD-10-CM | POA: Diagnosis not present

## 2014-01-22 DIAGNOSIS — Z794 Long term (current) use of insulin: Secondary | ICD-10-CM | POA: Insufficient documentation

## 2014-01-22 DIAGNOSIS — E785 Hyperlipidemia, unspecified: Secondary | ICD-10-CM

## 2014-01-22 DIAGNOSIS — G8929 Other chronic pain: Secondary | ICD-10-CM | POA: Diagnosis not present

## 2014-01-22 DIAGNOSIS — Z87891 Personal history of nicotine dependence: Secondary | ICD-10-CM | POA: Diagnosis not present

## 2014-01-22 DIAGNOSIS — Z1211 Encounter for screening for malignant neoplasm of colon: Secondary | ICD-10-CM

## 2014-01-22 DIAGNOSIS — Z139 Encounter for screening, unspecified: Secondary | ICD-10-CM | POA: Insufficient documentation

## 2014-01-22 DIAGNOSIS — I10 Essential (primary) hypertension: Secondary | ICD-10-CM | POA: Diagnosis not present

## 2014-01-22 DIAGNOSIS — E089 Diabetes mellitus due to underlying condition without complications: Secondary | ICD-10-CM

## 2014-01-22 DIAGNOSIS — E119 Type 2 diabetes mellitus without complications: Secondary | ICD-10-CM | POA: Insufficient documentation

## 2014-01-22 DIAGNOSIS — E139 Other specified diabetes mellitus without complications: Secondary | ICD-10-CM

## 2014-01-22 LAB — GLUCOSE, POCT (MANUAL RESULT ENTRY): POC GLUCOSE: 105 mg/dL — AB (ref 70–99)

## 2014-01-22 LAB — POCT GLYCOSYLATED HEMOGLOBIN (HGB A1C)

## 2014-01-22 MED ORDER — METFORMIN HCL ER (MOD) 500 MG PO TB24: 500.0000 mg | ORAL_TABLET | Freq: Two times a day (BID) | ORAL | Status: DC

## 2014-01-22 MED ORDER — OXYMETAZOLINE HCL 0.05 % NA SOLN: 1.0000 | Freq: Two times a day (BID) | NASAL | Status: AC | PRN

## 2014-01-22 MED ORDER — BENAZEPRIL HCL 5 MG PO TABS: 10.0000 mg | ORAL_TABLET | Freq: Every morning | ORAL | Status: DC

## 2014-01-22 MED ORDER — LORATADINE 10 MG PO TABS: 10.0000 mg | ORAL_TABLET | Freq: Every day | ORAL | Status: DC | PRN

## 2014-01-22 MED ORDER — INSULIN GLARGINE 100 UNIT/ML ~~LOC~~ SOLN: 60.0000 [IU] | Freq: Every day | SUBCUTANEOUS | Status: DC

## 2014-01-22 MED ORDER — GABAPENTIN 300 MG PO CAPS: ORAL_CAPSULE | ORAL | Status: DC

## 2014-01-22 MED ORDER — TRAMADOL HCL 50 MG PO TABS: 50.0000 mg | ORAL_TABLET | Freq: Three times a day (TID) | ORAL | Status: DC | PRN

## 2014-01-22 MED ORDER — FAMOTIDINE 20 MG PO TABS: ORAL_TABLET | ORAL | Status: DC

## 2014-01-22 MED ORDER — GLIPIZIDE 5 MG PO TABS: 5.0000 mg | ORAL_TABLET | Freq: Two times a day (BID) | ORAL | Status: DC

## 2014-01-22 MED ORDER — ATORVASTATIN CALCIUM 20 MG PO TABS: 20.0000 mg | ORAL_TABLET | Freq: Every day | ORAL | Status: DC

## 2014-01-22 MED ORDER — CLOPIDOGREL BISULFATE 75 MG PO TABS: 75.0000 mg | ORAL_TABLET | Freq: Every day | ORAL | Status: DC

## 2014-01-22 MED ORDER — CYCLOBENZAPRINE HCL 5 MG PO TABS: ORAL_TABLET | ORAL | Status: DC

## 2014-01-22 NOTE — Progress Notes (Signed)
Patient here for follow up on her Diabetes and  Medication refills

## 2014-01-22 NOTE — Progress Notes (Signed)
MRN: [id number] Name: Sarah Alvarez  Sex: female Age: 63 y.o. DOB: September 20, 1950  Allergies: Review of patient's allergies indicates no known allergies.  Chief Complaint  Patient presents with  . Follow-up    HPI: Patient is 63 y.o. female who has to of diabetes hypertension hyperlipidemia comes today for followup, denies any acute symptoms, patient is taking 500 mg metformin, Glucotrol 5 mg twice a day as well as Lantus 60 units each bedtime, on the last visit her metformin was reduced because of hypoglycemia but now her A1c has trended up, as per patient she is compliant with low carbohydrate diet, she denies any acute symptoms, requesting refill on her medications.  Past Medical History  Diagnosis Date  . Diabetes mellitus   . Staph infection   . Restless leg syndrome   . Chronic mesenteric ischemia     s/p SMA stent 06/22/13  . Coronary artery disease     Past Surgical History  Procedure Laterality Date  . Cholecystectomy    . Abdominal hysterectomy    . Coronary angioplasty      ? LCX stent ~ 2000 in [place], [place] with LCX stent [facility] 09/1999 for reported re-instent stenosis  . Endarterectomy Right 07/19/2013    Procedure: ENDARTERECTOMY CAROTID-RIGHT;  Surgeon: Angelia Mould, MD;  Location: [facility] OR;  Service: Vascular;  Laterality: Right;      Medication List       This list is accurate as of: 01/22/14  2:30 PM.  Always use your most recent med list.               acetaminophen 650 MG CR tablet  Commonly known as:  TYLENOL  Take 650 mg by mouth every 8 (eight) hours as needed for pain.     aspirin 325 MG tablet  Take 1 tablet (325 mg total) by mouth daily.     atorvastatin 20 MG tablet  Commonly known as:  LIPITOR  Take 1 tablet (20 mg total) by mouth daily.     benazepril 5 MG tablet  Commonly known as:  LOTENSIN  Take 2 tablets (10 mg total) by mouth every morning.     carbamide peroxide 6.5 % otic solution  Commonly known as:  DEBROX  Place 5  drops into both ears 2 (two) times daily.     clopidogrel 75 MG tablet  Commonly known as:  PLAVIX  Take 1 tablet (75 mg total) by mouth daily with breakfast.     cyclobenzaprine 5 MG tablet  Commonly known as:  FLEXERIL  TAKE 1 TABLET BY MOUTH 3 TIMES DAILY AS NEEDED FOR MUSCLE SPASMS.     diclofenac sodium 1 % Gel  Commonly known as:  VOLTAREN  Apply 4 g topically 4 (four) times daily.     famotidine 20 MG tablet  Commonly known as:  PEPCID  TAKE 1 TABLET BY MOUTH 2 TIMES DAILY.     gabapentin 300 MG capsule  Commonly known as:  NEURONTIN  TAKE 1 CAPSULE BY MOUTH AT BEDTIME.     glipiZIDE 5 MG tablet  Commonly known as:  GLUCOTROL  Take 1 tablet (5 mg total) by mouth 2 (two) times daily before a meal.     insulin glargine 100 UNIT/ML injection  Commonly known as:  LANTUS  Inject 0.6 mLs (60 Units total) into the skin at bedtime.     loratadine 10 MG tablet  Commonly known as:  CLARITIN  Take 1 tablet (10 mg total)  by mouth daily as needed for allergies or rhinitis.     metFORMIN 500 MG (MOD) 24 hr tablet  Commonly known as:  GLUMETZA  Take 1 tablet (500 mg total) by mouth 2 (two) times daily with a meal.     oxyCODONE-acetaminophen 5-325 MG per tablet  Commonly known as:  PERCOCET/ROXICET  Take 1 tablet by mouth every 6 (six) hours as needed for moderate pain.     oxymetazoline 0.05 % nasal spray  Commonly known as:  AFRIN NASAL SPRAY  Place 1 spray into both nostrils 2 (two) times daily as needed for congestion.     traMADol 50 MG tablet  Commonly known as:  ULTRAM  Take 1 tablet (50 mg total) by mouth every 8 (eight) hours as needed.     Vitamin D (Ergocalciferol) 50000 UNITS Caps capsule  Commonly known as:  DRISDOL  Take 50,000 Units by mouth every 7 (seven) days.        Meds ordered this encounter  Medications  . atorvastatin (LIPITOR) 20 MG tablet    Sig: Take 1 tablet (20 mg total) by mouth daily.    Dispense:  30 tablet    Refill:  5  .  benazepril (LOTENSIN) 5 MG tablet    Sig: Take 2 tablets (10 mg total) by mouth every morning.    Dispense:  60 tablet    Refill:  2  . clopidogrel (PLAVIX) 75 MG tablet    Sig: Take 1 tablet (75 mg total) by mouth daily with breakfast.    Dispense:  30 tablet    Refill:  3  . cyclobenzaprine (FLEXERIL) 5 MG tablet    Sig: TAKE 1 TABLET BY MOUTH 3 TIMES DAILY AS NEEDED FOR MUSCLE SPASMS.    Dispense:  60 tablet    Refill:  1  . famotidine (PEPCID) 20 MG tablet    Sig: TAKE 1 TABLET BY MOUTH 2 TIMES DAILY.    Dispense:  60 tablet    Refill:  0  . gabapentin (NEURONTIN) 300 MG capsule    Sig: TAKE 1 CAPSULE BY MOUTH AT BEDTIME.    Dispense:  30 capsule    Refill:  1  . glipiZIDE (GLUCOTROL) 5 MG tablet    Sig: Take 1 tablet (5 mg total) by mouth 2 (two) times daily before a meal.    Dispense:  60 tablet    Refill:  2  . insulin glargine (LANTUS) 100 UNIT/ML injection    Sig: Inject 0.6 mLs (60 Units total) into the skin at bedtime.    Dispense:  60 mL    Refill:  1 year  . loratadine (CLARITIN) 10 MG tablet    Sig: Take 1 tablet (10 mg total) by mouth daily as needed for allergies or rhinitis.    Dispense:  30 tablet    Refill:  11  . metFORMIN (GLUMETZA) 500 MG (MOD) 24 hr tablet    Sig: Take 1 tablet (500 mg total) by mouth 2 (two) times daily with a meal.    Dispense:  60 tablet    Refill:  2  . oxymetazoline (AFRIN NASAL SPRAY) 0.05 % nasal spray    Sig: Place 1 spray into both nostrils 2 (two) times daily as needed for congestion.    Dispense:  30 mL    Refill:  0  . traMADol (ULTRAM) 50 MG tablet    Sig: Take 1 tablet (50 mg total) by mouth every 8 (eight) hours as needed.  Dispense:  60 tablet    Refill:  1    Immunization History  Administered Date(s) Administered  . Influenza Split 06/05/2012  . Influenza,inj,Quad PF,36+ Mos 06/12/2013, 06/21/2013  . Pneumococcal Polysaccharide-23 06/21/2013    Family History  Problem Relation Age of Onset  . Diabetes  Mother   . Cancer Father   . Diabetes Sister   . Hypertension Sister   . Cancer Brother   . Cancer Maternal Aunt     breast cancer   . Diabetes Maternal Grandmother   . Diabetes Paternal Grandmother     History  Substance Use Topics  . Smoking status: Former Smoker -- 1.00 packs/day for 20 years    Quit date: 06/21/2013  . Smokeless tobacco: Never Used  . Alcohol Use: No    Review of Systems   As noted in HPI  Filed Vitals:   01/22/14 1410  BP: 117/75  Pulse: 76  Temp: 98.8 F (37.1 C)  Resp: 16    Physical Exam  Physical Exam  Constitutional: No distress.  Eyes: EOM are normal. Pupils are equal, round, and reactive to light.  Cardiovascular: Normal rate and regular rhythm.   Pulmonary/Chest: Breath sounds normal. No respiratory distress. She has no wheezes. She has no rales.  Abdominal: She exhibits no distension. There is no tenderness. There is no rebound.  Musculoskeletal: She exhibits no edema.    CBC    Component Value Date/Time   WBC 6.1 08/17/2013 0830   RBC 3.65* 08/17/2013 0830   HGB 11.7* 08/17/2013 0830   HCT 34.5* 08/17/2013 0830   PLT 286 08/17/2013 0830   MCV 94.5 08/17/2013 0830   LYMPHSABS 1.3 08/13/2013 0856   MONOABS 0.2 08/13/2013 0856   EOSABS 0.1 08/13/2013 0856   BASOSABS 0.0 08/13/2013 0856    CMP     Component Value Date/Time   NA 140 08/17/2013 0830   K 3.9 08/17/2013 0830   CL 104 08/17/2013 0830   CO2 23 08/17/2013 0830   GLUCOSE 117* 08/17/2013 0830   BUN 12 08/17/2013 0830   CREATININE 0.48* 08/17/2013 0830   CREATININE 0.49* 08/13/2013 0856   CALCIUM 8.8 08/17/2013 0830   PROT 6.4 08/17/2013 0830   ALBUMIN 3.8 08/17/2013 0830   AST 12 08/17/2013 0830   ALT 6 08/17/2013 0830   ALKPHOS 81 08/17/2013 0830   BILITOT <0.2* 08/17/2013 0830   GFRNONAA >90 08/17/2013 0830   GFRNONAA >89 08/13/2013 0856   GFRAA >90 08/17/2013 0830   GFRAA >89 08/13/2013 0856    Lab Results  Component Value Date/Time   CHOL 124 08/13/2013  8:56 AM    No components found with this  basename: hga1c    Lab Results  Component Value Date/Time   AST 12 08/17/2013  8:30 AM    Assessment and Plan  Diabetes mellitus due to underlying condition without complications - Plan:  Results for orders placed in visit on 01/22/14  GLUCOSE, POCT (MANUAL RESULT ENTRY)      Result Value Ref Range   POC Glucose 105 (*) 70 - 99 mg/dl  POCT GLYCOSYLATED HEMOGLOBIN (HGB A1C)      Result Value Ref Range   Hemoglobin A1C 8%     Patient will continue glipiZIDE (GLUCOTROL) 5 MG tablet, insulin glargine (LANTUS) 100 UNIT/ML injection, she will resume back on metformin 500 mg twice a day, will check her A1c in 3 months.  Other and unspecified hyperlipidemia - Plan: Continue with atorvastatin (LIPITOR) 20 MG tablet  Essential  hypertension, benign - Plan: Blood pressure is well controlled benazepril (LOTENSIN) 5 MG tablet  Chronic low back pain Patient is on tramadol when necessary  Screening - Plan: MM DIGITAL SCREENING BILATERAL  Special screening for malignant neoplasms, colon - Plan: Ambulatory referral to Gastroenterology   Health Maintenance -Colonoscopy: referred to GI  -Mammogram: ordered    Return in about 3 months (around 04/24/2014) for diabetes, hypertension.  Lorayne Marek, MD

## 2014-01-28 ENCOUNTER — Other Ambulatory Visit (HOSPITAL_COMMUNITY): Payer: Self-pay | Admitting: Interventional Radiology

## 2014-01-28 ENCOUNTER — Ambulatory Visit (HOSPITAL_COMMUNITY)
Admission: RE | Admit: 2014-01-28 | Discharge: 2014-01-28 | Disposition: A | Discharge status: Home / Self Care | Payer: Medicaid Other | Source: Ambulatory Visit | Attending: Interventional Radiology | Admitting: Interventional Radiology

## 2014-01-28 DIAGNOSIS — K449 Diaphragmatic hernia without obstruction or gangrene: Secondary | ICD-10-CM | POA: Diagnosis not present

## 2014-01-28 DIAGNOSIS — I771 Stricture of artery: Principal | ICD-10-CM

## 2014-01-28 DIAGNOSIS — N838 Other noninflammatory disorders of ovary, fallopian tube and broad ligament: Secondary | ICD-10-CM | POA: Diagnosis not present

## 2014-01-28 DIAGNOSIS — I701 Atherosclerosis of renal artery: Secondary | ICD-10-CM | POA: Diagnosis not present

## 2014-01-28 DIAGNOSIS — K559 Vascular disorder of intestine, unspecified: Secondary | ICD-10-CM

## 2014-01-28 DIAGNOSIS — E278 Other specified disorders of adrenal gland: Secondary | ICD-10-CM | POA: Insufficient documentation

## 2014-01-28 DIAGNOSIS — K551 Chronic vascular disorders of intestine: Secondary | ICD-10-CM

## 2014-01-28 DIAGNOSIS — Z09 Encounter for follow-up examination after completed treatment for conditions other than malignant neoplasm: Secondary | ICD-10-CM | POA: Insufficient documentation

## 2014-01-28 DIAGNOSIS — I708 Atherosclerosis of other arteries: Secondary | ICD-10-CM | POA: Diagnosis not present

## 2014-01-28 DIAGNOSIS — R9389 Abnormal findings on diagnostic imaging of other specified body structures: Secondary | ICD-10-CM | POA: Insufficient documentation

## 2014-01-28 LAB — POCT I-STAT, CHEM 8
BUN: 15 mg/dL (ref 6–23)
CALCIUM ION: 1.15 mmol/L (ref 1.13–1.30)
CREATININE: 0.6 mg/dL (ref 0.50–1.10)
Chloride: 106 mEq/L (ref 96–112)
Glucose, Bld: 213 mg/dL — ABNORMAL HIGH (ref 70–99)
HCT: 37 % (ref 36.0–46.0)
HEMOGLOBIN: 12.6 g/dL (ref 12.0–15.0)
Potassium: 4.1 mEq/L (ref 3.7–5.3)
Sodium: 140 mEq/L (ref 137–147)
TCO2: 21 mmol/L (ref 0–100)

## 2014-01-28 MED ORDER — IOHEXOL 350 MG/ML SOLN
100.0000 mL | Freq: Once | INTRAVENOUS | Status: AC | PRN
  Administered 2014-01-28: 100 mL via INTRAVENOUS

## 2014-01-29 ENCOUNTER — Encounter: Payer: Self-pay | Admitting: Family

## 2014-01-30 ENCOUNTER — Ambulatory Visit
Admission: RE | Admit: 2014-01-30 | Discharge: 2014-01-30 | Disposition: A | Discharge status: Home / Self Care | Payer: Medicaid Other | Source: Ambulatory Visit | Attending: Interventional Radiology | Admitting: Interventional Radiology

## 2014-01-30 ENCOUNTER — Ambulatory Visit (HOSPITAL_COMMUNITY)
Admission: RE | Admit: 2014-01-30 | Discharge: 2014-01-30 | Disposition: A | Discharge status: Home / Self Care | Payer: Medicaid Other | Source: Ambulatory Visit | Attending: Family | Admitting: Family

## 2014-01-30 ENCOUNTER — Encounter: Payer: Self-pay | Admitting: Family

## 2014-01-30 ENCOUNTER — Other Ambulatory Visit: Payer: Self-pay

## 2014-01-30 ENCOUNTER — Ambulatory Visit (INDEPENDENT_AMBULATORY_CARE_PROVIDER_SITE_OTHER): Payer: Medicaid Other | Admitting: Family

## 2014-01-30 VITALS — BP 100/67 | HR 89 | Resp 16 | Ht 63.0 in | Wt 163.0 lb

## 2014-01-30 DIAGNOSIS — Z48812 Encounter for surgical aftercare following surgery on the circulatory system: Secondary | ICD-10-CM | POA: Insufficient documentation

## 2014-01-30 DIAGNOSIS — I6529 Occlusion and stenosis of unspecified carotid artery: Secondary | ICD-10-CM

## 2014-01-30 DIAGNOSIS — K559 Vascular disorder of intestine, unspecified: Secondary | ICD-10-CM

## 2014-01-30 DIAGNOSIS — I771 Stricture of artery: Principal | ICD-10-CM

## 2014-01-30 DIAGNOSIS — K551 Chronic vascular disorders of intestine: Secondary | ICD-10-CM

## 2014-01-30 NOTE — Progress Notes (Signed)
Established Carotid Patient   History of Present Illness  Sarah Alvarez is a 63 y.o. female patient of Dr. Scot Alvarez who was found to have a carotid bruit during a hospitalization. Carotid duplex scan showed a greater than 80% right carotid stenosis. She underwent a right carotid endarterectomy with Dacron patch angioplasty on 07/19/2013. 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 This was done by interventional radiology.  She returns for carotid artery surveillance.  She started noticing tingling, pain, and weakness in right hand and tingling in right lower leg for over a year, worse in her right leg at night, otherwise worse in her right hand in general, not worsening or improving. She is right hand dominant.  She denies loss of vision in one eye vs the other, denies dizziness, denies post prandial abdominal pain since SMA stenting done, denies aphasia or confusion. Pt states her PCP wants her to have her first colonoscopy but needs to be off Plavix that she takes for PAD, prescribed while hospitalized for right CEA. She denies ever having a stroke or TIA.  She denies any history of MI or kidney problems.   The patient's previous neurologic deficits are Unchanged. She denies claudication symptoms with walking, denies non healing wounds in legs or feet.  Pt denies other New Medical or Surgical History.  Pt Diabetic: Yes, states her last A1C was 9.?, uncontrolled Pt smoker: former smoker, quit during January, 2015 hospital admission for right CEA, but is yet exposed to 2 smokers in her house.  Pt meds include: Statin : Yes ASA: Yes, 81 mg daily Other anticoagulants/antiplatelets: Plavix   Past Medical History  Diagnosis Date  . Diabetes mellitus   . Staph infection   . Restless leg syndrome   . Chronic mesenteric ischemia     s/p SMA stent 06/22/13  . Coronary artery disease      Social History History  Substance Use Topics  . Smoking status: Former Smoker -- 1.00 packs/day for 20 years    Quit date: 06/21/2013  . Smokeless tobacco: Never Used  . Alcohol Use: No    Family History Family History  Problem Relation Age of Onset  . Diabetes Mother   . Cancer Father   . Diabetes Sister   . Hypertension Sister   . Cancer Brother   . Cancer Maternal Aunt     breast cancer   . Diabetes Maternal Grandmother   . Diabetes Paternal Grandmother     Surgical History Past Surgical History  Procedure Laterality Date  . Cholecystectomy    . Abdominal hysterectomy    . Coronary angioplasty      ? LCX stent ~ 2000 in [place], [place] with LCX stent [facility] 09/1999 for reported re-instent stenosis  . Endarterectomy Right 07/19/2013    Procedure: ENDARTERECTOMY CAROTID-RIGHT;  Surgeon: Sarah Mould, MD;  Location: [facility] OR;  Service: Vascular;  Laterality: Right;    No Known Allergies  Current Outpatient Prescriptions  Medication Sig Dispense Refill  . acetaminophen (TYLENOL) 650 MG CR tablet Take 650 mg by mouth every 8 (eight) hours as needed for pain.      Marland Kitchen aspirin 325 MG tablet Take 1 tablet (325 mg total) by mouth daily.      Marland Kitchen atorvastatin (LIPITOR) 20 MG tablet Take 1 tablet (20 mg total) by mouth daily.  30 tablet  5  . benazepril (LOTENSIN) 5 MG tablet Take 2  tablets (10 mg total) by mouth every morning.  60 tablet  2  . carbamide peroxide (DEBROX) 6.5 % otic solution Place 5 drops into both ears 2 (two) times daily.  15 mL  1  . clopidogrel (PLAVIX) 75 MG tablet Take 1 tablet (75 mg total) by mouth daily with breakfast.  30 tablet  3  . cyclobenzaprine (FLEXERIL) 5 MG tablet TAKE 1 TABLET BY MOUTH 3 TIMES DAILY AS NEEDED FOR MUSCLE SPASMS.  60 tablet  1  . diclofenac sodium (VOLTAREN) 1 % GEL Apply 4 g topically 4 (four) times daily.  100 g  1  . famotidine (PEPCID) 20 MG tablet TAKE 1 TABLET BY MOUTH 2 TIMES DAILY.  60 tablet  0  . gabapentin  (NEURONTIN) 300 MG capsule TAKE 1 CAPSULE BY MOUTH AT BEDTIME.  30 capsule  1  . glipiZIDE (GLUCOTROL) 5 MG tablet Take 1 tablet (5 mg total) by mouth 2 (two) times daily before a meal.  60 tablet  2  . insulin glargine (LANTUS) 100 UNIT/ML injection Inject 0.6 mLs (60 Units total) into the skin at bedtime.  60 mL  1 year  . loratadine (CLARITIN) 10 MG tablet Take 1 tablet (10 mg total) by mouth daily as needed for allergies or rhinitis.  30 tablet  11  . metFORMIN (GLUMETZA) 500 MG (MOD) 24 hr tablet Take 1 tablet (500 mg total) by mouth 2 (two) times daily with a meal.  60 tablet  2  . oxyCODONE-acetaminophen (PERCOCET/ROXICET) 5-325 MG per tablet Take 1 tablet by mouth every 6 (six) hours as needed for moderate pain.  30 tablet  0  . oxymetazoline (AFRIN NASAL SPRAY) 0.05 % nasal spray Place 1 spray into both nostrils 2 (two) times daily as needed for congestion.  30 mL  0  . traMADol (ULTRAM) 50 MG tablet Take 1 tablet (50 mg total) by mouth every 8 (eight) hours as needed.  60 tablet  1  . Vitamin D, Ergocalciferol, (DRISDOL) 50000 UNITS CAPS capsule Take 50,000 Units by mouth every 7 (seven) days.      . [DISCONTINUED] colchicine 0.6 MG tablet Take two tab PO, then take 1 tab PO 1 hour later  6 tablet  0   No current facility-administered medications for this visit.    Review of Systems : See HPI for pertinent positives and negatives.  Physical Examination  Filed Vitals:   01/30/14 0943 01/30/14 0946  BP: 99/67 100/67  Pulse: 88 89  Resp:  16  Height:  5\' 3"  (1.6 m)  Weight:  163 lb (73.936 kg)  SpO2:  100%   Body mass index is 28.88 kg/(m^2).  General: WDWN female in NAD GAIT: normal Eyes: PERRLA Pulmonary:  Non-labored, CTAB,decreased air movement in all fields, Negative  Rales, Negative rhonchi, & Negative wheezing.  Cardiac: regular Rhythm ,  Positive detected murmur.  VASCULAR EXAM Carotid Bruits Left Right   Positive, soft Positive, harsh   Radial pulses are 2+  palpable and equal.  LE Pulses LEFT RIGHT       POPLITEAL  not palpable   not palpable       POSTERIOR TIBIAL  2+ palpable   2+ palpable        DORSALIS PEDIS      ANTERIOR TIBIAL not palpable  2+ palpable     Gastrointestinal: soft, nontender, BS WNL, no r/g,  negative masses.  Musculoskeletal: Negative muscle atrophy/wasting. M/S 5/5 in left upper extremity, 4/5 in left lower extremity, 3/5 in right upper and lower extremities, Extremities without ischemic changes.  Neurologic: A&O X 3; Appropriate Affect ;  Speech is normal CN 2-12 intact, Pain and light touch intact in extremities, Motor exam as listed above.   Non-Invasive Vascular Imaging CAROTID DUPLEX 01/30/2014   CEREBROVASCULAR DUPLEX EVALUATION    INDICATION: Carotid disease     PREVIOUS INTERVENTION(S): Right carotid endarterectomy 07/19/2013    DUPLEX EXAM:     RIGHT  LEFT  Peak Systolic Velocities (cm/s) End Diastolic Velocities (cm/s) Plaque LOCATION Peak Systolic Velocities (cm/s) End Diastolic Velocities (cm/s) Plaque  214 27  CCA PROXIMAL 38 15 HM  120 25  CCA MID 57 24 HM  126 30  CCA DISTAL 47 20 HM  203 21  ECA 89 10 HT  137 39  ICA PROXIMAL 38 18 HM  178 59  ICA MID 84 36 HT  131 38  ICA DISTAL 71 31     NA ICA / CCA Ratio (PSV) NA  Antegrade  Vertebral Flow Antegrade   68 Brachial Systolic Pressure (mmHg) 84  Within normal limits  Brachial Artery Waveforms Within normal limits     Plaque Morphology:  HM = Homogeneous, HT = Heterogeneous, CP = Calcific Plaque, SP = Smooth Plaque, IP = Irregular Plaque     ADDITIONAL FINDINGS: Subclavian artery velocities (cm/second): R- 401, L- 503     IMPRESSION: 1. Widely patent right carotid endarterectomy without evidence of restenosis or hyperplasia.   2. Significantly dampened waveforms are present throughout the left common carotid  artery and internal carotid artery consistent with severe proximal stenosis. Soft disease is observed is lining the common carotid artery and at the bifurcation with calcific plaque in the mid internal carotid artery. Disease category cannot be calculated due to proximal disease.  3. Right vertebral artery is antegrade with abnormal waveforms.  4. Left vertebral artery is bidirectional indicating a pre-subclavian steal state.  5. Significant bilateral subclavian artery stenosis is present.      Compared to the previous exam:  No previous exam at this facility for comparison.       Assessment: Sarah Alvarez is a 63 y.o. female who ris s/p right carotid endarterectomy with Dacron patch angioplasty on 07/19/2013 and presents with symptomatic widely patent right carotid endarterectomy without evidence of restenosis or hyperplasia.   Significantly dampened waveforms are present throughout the left common carotid artery and internal carotid artery consistent with severe proximal stenosis. Soft disease is observed is lining the common carotid artery and at the bifurcation with calcific plaque in the mid internal carotid artery. Disease category cannot be calculated due to proximal disease.  Right vertebral artery is antegrade with abnormal waveforms.  Left vertebral artery is bidirectional indicating a pre-subclavian steal state.  Significant bilateral subclavian artery stenosis is present.  No previous exam at this facility for comparison.    Plan: Based on today's exam and Duplex results, and after discussing with Dr. Scot Alvarez, and Dr. Scot Alvarez speaking with pt, will schedule  arch aortogram for 02/11/14, Dr. Scot Alvarez.  The patient was given information about stroke prevention and what symptoms should prompt the patient to seek immediate medical care. Thank you for allowing [place] to participate in this patient's care.  Clemon Chambers, RN, MSN, FNP-C [facility]  [place] Office: [phone]  Clinic Physician: Sarah Alvarez  01/30/2014 9:17 AM

## 2014-01-30 NOTE — Progress Notes (Signed)
Patient reports that her weight 160 lbs, up 10 lbs.  Appetite good.  Denies post prandial pain.  States that she takes a laxative once/week.  Has not resumed smoking, but lives with a brother & sister, both smokers.  Miranda Garber Riki Rusk, RN 01/30/2014 1:37 PM

## 2014-01-30 NOTE — Patient Instructions (Signed)
Stroke Prevention Some medical conditions and behaviors are associated with an increased chance of having a stroke. You may prevent a stroke by making healthy choices and managing medical conditions. HOW CAN I REDUCE MY RISK OF HAVING A STROKE?   Stay physically active. Get at least 30 minutes of activity on most or all days.  Do not smoke. It may also be helpful to avoid exposure to secondhand smoke.  Limit alcohol use. Moderate alcohol use is considered to be:  No more than 2 drinks per day for men.  No more than 1 drink per day for nonpregnant women.  Eat healthy foods. This involves  Eating 5 or more servings of fruits and vegetables a day.  Following a diet that addresses high blood pressure (hypertension), high cholesterol, diabetes, or obesity.  Manage your cholesterol levels.  A diet low in saturated fat, trans fat, and cholesterol and high in fiber may control cholesterol levels.  Take any prescribed medicines to control cholesterol as directed by your health care provider.  Manage your diabetes.  A controlled-carbohydrate, controlled-sugar diet is recommended to manage diabetes.  Take any prescribed medicines to control diabetes as directed by your health care provider.  Control your hypertension.  A low-salt (sodium), low-saturated fat, low-trans fat, and low-cholesterol diet is recommended to manage hypertension.  Take any prescribed medicines to control hypertension as directed by your health care provider.  Maintain a healthy weight.  A reduced-calorie, low-sodium, low-saturated fat, low-trans fat, low-cholesterol diet is recommended to manage weight.  Stop drug abuse.  Avoid taking birth control pills.  Talk to your health care provider about the risks of taking birth control pills if you are over 35 years old, smoke, get migraines, or have ever had a blood clot.  Get evaluated for sleep disorders (sleep apnea).  Talk to your health care provider about  getting a sleep evaluation if you snore a lot or have excessive sleepiness.  Take medicines as directed by your health care provider.  For some people, aspirin or blood thinners (anticoagulants) are helpful in reducing the risk of forming abnormal blood clots that can lead to stroke. If you have the irregular heart rhythm of atrial fibrillation, you should be on a blood thinner unless there is a good reason you cannot take them.  Understand all your medicine instructions.  Make sure that other other conditions (such as anemia or atherosclerosis) are addressed. SEEK IMMEDIATE MEDICAL CARE IF:   You have sudden weakness or numbness of the face, arm, or leg, especially on one side of the body.  Your face or eyelid droops to one side.  You have sudden confusion.  You have trouble speaking (aphasia) or understanding.  You have sudden trouble seeing in one or both eyes.  You have sudden trouble walking.  You have dizziness.  You have a loss of balance or coordination.  You have a sudden, severe headache with no known cause.  You have new chest pain or an irregular heartbeat. Any of these symptoms may represent a serious problem that is an emergency. Do not wait to see if the symptoms will go away. Get medical help at once. Call your local emergency services  (911 in [place] Do not drive yourself to the hospital. Document Released: 08/05/2004 Document Revised: 04/18/2013 Document Reviewed: 12/29/2012 [employer] Patient Information 2015 [employer]. This information is not intended to replace advice given to you by your health care provider. Make sure you discuss any questions you have with your health   care provider.  

## 2014-02-01 ENCOUNTER — Encounter (HOSPITAL_COMMUNITY): Payer: Self-pay | Admitting: Pharmacy Technician

## 2014-02-10 MED ORDER — SODIUM CHLORIDE 0.9 % IV SOLN
INTRAVENOUS | Status: DC
  Administered 2014-02-11: 09:00:00 via INTRAVENOUS

## 2014-02-11 ENCOUNTER — Encounter (HOSPITAL_COMMUNITY)
Admission: RE | Disposition: A | Discharge status: Home / Self Care | Payer: Self-pay | Source: Ambulatory Visit | Attending: Vascular Surgery

## 2014-02-11 ENCOUNTER — Telehealth: Payer: Self-pay | Admitting: Vascular Surgery

## 2014-02-11 ENCOUNTER — Ambulatory Visit (HOSPITAL_COMMUNITY)
Admission: RE | Admit: 2014-02-11 | Discharge: 2014-02-11 | Disposition: A | Discharge status: Home / Self Care | Payer: Medicaid Other | Source: Ambulatory Visit | Attending: Vascular Surgery | Admitting: Vascular Surgery

## 2014-02-11 DIAGNOSIS — Z791 Long term (current) use of non-steroidal anti-inflammatories (NSAID): Secondary | ICD-10-CM | POA: Insufficient documentation

## 2014-02-11 DIAGNOSIS — Z87891 Personal history of nicotine dependence: Secondary | ICD-10-CM | POA: Diagnosis not present

## 2014-02-11 DIAGNOSIS — Z794 Long term (current) use of insulin: Secondary | ICD-10-CM | POA: Diagnosis not present

## 2014-02-11 DIAGNOSIS — Z7902 Long term (current) use of antithrombotics/antiplatelets: Secondary | ICD-10-CM | POA: Diagnosis not present

## 2014-02-11 DIAGNOSIS — R29898 Other symptoms and signs involving the musculoskeletal system: Secondary | ICD-10-CM | POA: Diagnosis present

## 2014-02-11 DIAGNOSIS — I6509 Occlusion and stenosis of unspecified vertebral artery: Secondary | ICD-10-CM

## 2014-02-11 DIAGNOSIS — G2581 Restless legs syndrome: Secondary | ICD-10-CM | POA: Insufficient documentation

## 2014-02-11 DIAGNOSIS — Z7982 Long term (current) use of aspirin: Secondary | ICD-10-CM | POA: Insufficient documentation

## 2014-02-11 DIAGNOSIS — Z9889 Other specified postprocedural states: Secondary | ICD-10-CM | POA: Insufficient documentation

## 2014-02-11 DIAGNOSIS — E119 Type 2 diabetes mellitus without complications: Secondary | ICD-10-CM | POA: Diagnosis not present

## 2014-02-11 DIAGNOSIS — I2510 Atherosclerotic heart disease of native coronary artery without angina pectoris: Secondary | ICD-10-CM | POA: Insufficient documentation

## 2014-02-11 DIAGNOSIS — I6529 Occlusion and stenosis of unspecified carotid artery: Secondary | ICD-10-CM

## 2014-02-11 DIAGNOSIS — I771 Stricture of artery: Secondary | ICD-10-CM | POA: Insufficient documentation

## 2014-02-11 HISTORY — PX: ARCH AORTOGRAM: SHX5501

## 2014-02-11 LAB — POCT I-STAT, CHEM 8
BUN: 9 mg/dL (ref 6–23)
CALCIUM ION: 1.27 mmol/L (ref 1.13–1.30)
CHLORIDE: 101 meq/L (ref 96–112)
CREATININE: 0.6 mg/dL (ref 0.50–1.10)
GLUCOSE: 69 mg/dL — AB (ref 70–99)
HCT: 39 % (ref 36.0–46.0)
Hemoglobin: 13.3 g/dL (ref 12.0–15.0)
Potassium: 4.8 mEq/L (ref 3.7–5.3)
Sodium: 142 mEq/L (ref 137–147)
TCO2: 27 mmol/L (ref 0–100)

## 2014-02-11 LAB — GLUCOSE, CAPILLARY
Glucose-Capillary: 73 mg/dL (ref 70–99)
Glucose-Capillary: 127 mg/dL — ABNORMAL HIGH (ref 70–99)
Glucose-Capillary: 59 mg/dL — ABNORMAL LOW (ref 70–99)
Glucose-Capillary: 75 mg/dL (ref 70–99)

## 2014-02-11 SURGERY — ARCH AORTOGRAM
Anesthesia: LOCAL

## 2014-02-11 MED ORDER — LIDOCAINE HCL (PF) 1 % IJ SOLN
INTRAMUSCULAR | Status: AC
  Filled 2014-02-11: qty 30

## 2014-02-11 MED ORDER — HEPARIN (PORCINE) IN NACL 2-0.9 UNIT/ML-% IJ SOLN
INTRAMUSCULAR | Status: AC
  Filled 2014-02-11: qty 1000

## 2014-02-11 MED ORDER — SODIUM CHLORIDE 0.9 % IV SOLN: 1.0000 mL/kg/h | INTRAVENOUS | Status: DC

## 2014-02-11 MED ORDER — DEXTROSE 50 % IV SOLN
25.0000 mL | Freq: Once | INTRAVENOUS | Status: AC | PRN
  Administered 2014-02-11: 25 mL via INTRAVENOUS

## 2014-02-11 MED ORDER — DEXTROSE 50 % IV SOLN
INTRAVENOUS | Status: AC
  Filled 2014-02-11: qty 50

## 2014-02-11 NOTE — Op Note (Signed)
   PATIENT: Sarah Alvarez   MRN: [id number] DOB: May 16, 1951    DATE OF PROCEDURE: 02/11/2014  INDICATIONS: AALIYAN BRINKMEIER is a 63 y.o. female who has been having transient right sided weakness. She is status post previous right carotid endarterectomy. She underwent a duplex scan which showed that her right carotid endarterectomy site was widely patent. She had dampened waveforms on the left suggesting a proximal left common carotid artery stenosis. She is brought in for carotid arteriogram.  PROCEDURE:  1. Ultrasound-guided access the right common femoral artery 2. Arch aortogram 3. Selective catheterization of the left subclavian artery with left subclavian arteriogram  SURGEON: Judeth Cornfield. Scot Dock, MD, FACS  ANESTHESIA: local    EBL: minimal  TECHNIQUE: Patient was taken to the peripheral vascular lab. Both groins were prepped and draped in the usual sterile fashion. Under ultrasound guidance, after the skin was anesthetized, the right common femoral artery was cannulated and a guidewire introduced into the infrarenal aorta under fluoroscopic control. A 5 French sheath was introduced of the wire. A long pigtail catheter was positioned in the ascending aortic arch and arch aortogram obtained. 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 There was a tight stenosis in the proximal left common carotid artery. Given that the catheter would be occlusive I elected not to selectively catheterize this. I did select the left subclavian artery to see if a left subclavian to carotid bypass would be an option if indicated. At the completion the catheter was removed and the patient transferred to the holding area for removal of the sheath. No immediate consultations were noted.   FINDINGS:  1. Aortic arch is widely patent. 2. 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 3.  There is a calcific plaque in the proximal right subclavian artery. The right common carotid artery is widely patent. 4. There is a tight focal stenosis in the proximal common carotid artery with some poststenotic dilatation. The carotid bifurcation is patent without significant stenosis. 5. There is mild stenosis in the proximal left subclavian artery but did not appear to be significant.  CLINICAL NOTE: I've discussed the case with Dr. Oneida Alar. She will be evaluated in the office for possible left proximal common carotid artery stenting.  Deitra Mayo, MD, FACS [facility]  DATE OF DICTATION:   02/11/2014

## 2014-02-11 NOTE — Progress Notes (Signed)
Site area: Right groin Site Prior to Removal:  Level 0 Pressure Applied For: 25 minutes Manual:   Yes; right PT pulse remained palpable during sheath pull Patient Status During Pull:  Stable Post Pull Site:  Level 0 Post Pull Instructions Given:  yes Post Pull Pulses Present: yes Dressing Applied:  Tegaderm Bedrest begins @ 11:50:00 Comments: No complications

## 2014-02-11 NOTE — H&P (View-Only) (Signed)
Established Carotid Patient   History of Present Illness  Sarah Alvarez is a 63 y.o. female patient of Dr. Scot Dock who was found to have a carotid bruit during a hospitalization. Carotid duplex scan showed a greater than 80% right carotid stenosis. She underwent a right carotid endarterectomy with Dacron patch angioplasty on 07/19/2013. 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 This was done by interventional radiology.  She returns for carotid artery surveillance.  She started noticing tingling, pain, and weakness in right hand and tingling in right lower leg for over a year, worse in her right leg at night, otherwise worse in her right hand in general, not worsening or improving. She is right hand dominant.  She denies loss of vision in one eye vs the other, denies dizziness, denies post prandial abdominal pain since SMA stenting done, denies aphasia or confusion. Pt states her PCP wants her to have her first colonoscopy but needs to be off Plavix that she takes for PAD, prescribed while hospitalized for right CEA. She denies ever having a stroke or TIA.  She denies any history of MI or kidney problems.   The patient's previous neurologic deficits are Unchanged. She denies claudication symptoms with walking, denies non healing wounds in legs or feet.  Pt denies other New Medical or Surgical History.  Pt Diabetic: Yes, states her last A1C was 9.?, uncontrolled Pt smoker: former smoker, quit during January, 2015 hospital admission for right CEA, but is yet exposed to 2 smokers in her house.  Pt meds include: Statin : Yes ASA: Yes, 81 mg daily Other anticoagulants/antiplatelets: Plavix   Past Medical History  Diagnosis Date  . Diabetes mellitus   . Staph infection   . Restless leg syndrome   . Chronic mesenteric ischemia     s/p SMA stent 06/22/13  . Coronary artery disease      Social History History  Substance Use Topics  . Smoking status: Former Smoker -- 1.00 packs/day for 20 years    Quit date: 06/21/2013  . Smokeless tobacco: Never Used  . Alcohol Use: No    Family History Family History  Problem Relation Age of Onset  . Diabetes Mother   . Cancer Father   . Diabetes Sister   . Hypertension Sister   . Cancer Brother   . Cancer Maternal Aunt     breast cancer   . Diabetes Maternal Grandmother   . Diabetes Paternal Grandmother     Surgical History Past Surgical History  Procedure Laterality Date  . Cholecystectomy    . Abdominal hysterectomy    . Coronary angioplasty      ? LCX stent ~ 2000 in [place], [place] with LCX stent [facility] 09/1999 for reported re-instent stenosis  . Endarterectomy Right 07/19/2013    Procedure: ENDARTERECTOMY CAROTID-RIGHT;  Surgeon: Angelia Mould, MD;  Location: [facility] OR;  Service: Vascular;  Laterality: Right;    No Known Allergies  Current Outpatient Prescriptions  Medication Sig Dispense Refill  . acetaminophen (TYLENOL) 650 MG CR tablet Take 650 mg by mouth every 8 (eight) hours as needed for pain.      Marland Kitchen aspirin 325 MG tablet Take 1 tablet (325 mg total) by mouth daily.      Marland Kitchen atorvastatin (LIPITOR) 20 MG tablet Take 1 tablet (20 mg total) by mouth daily.  30 tablet  5  . benazepril (LOTENSIN) 5 MG tablet Take 2  tablets (10 mg total) by mouth every morning.  60 tablet  2  . carbamide peroxide (DEBROX) 6.5 % otic solution Place 5 drops into both ears 2 (two) times daily.  15 mL  1  . clopidogrel (PLAVIX) 75 MG tablet Take 1 tablet (75 mg total) by mouth daily with breakfast.  30 tablet  3  . cyclobenzaprine (FLEXERIL) 5 MG tablet TAKE 1 TABLET BY MOUTH 3 TIMES DAILY AS NEEDED FOR MUSCLE SPASMS.  60 tablet  1  . diclofenac sodium (VOLTAREN) 1 % GEL Apply 4 g topically 4 (four) times daily.  100 g  1  . famotidine (PEPCID) 20 MG tablet TAKE 1 TABLET BY MOUTH 2 TIMES DAILY.  60 tablet  0  . gabapentin  (NEURONTIN) 300 MG capsule TAKE 1 CAPSULE BY MOUTH AT BEDTIME.  30 capsule  1  . glipiZIDE (GLUCOTROL) 5 MG tablet Take 1 tablet (5 mg total) by mouth 2 (two) times daily before a meal.  60 tablet  2  . insulin glargine (LANTUS) 100 UNIT/ML injection Inject 0.6 mLs (60 Units total) into the skin at bedtime.  60 mL  1 year  . loratadine (CLARITIN) 10 MG tablet Take 1 tablet (10 mg total) by mouth daily as needed for allergies or rhinitis.  30 tablet  11  . metFORMIN (GLUMETZA) 500 MG (MOD) 24 hr tablet Take 1 tablet (500 mg total) by mouth 2 (two) times daily with a meal.  60 tablet  2  . oxyCODONE-acetaminophen (PERCOCET/ROXICET) 5-325 MG per tablet Take 1 tablet by mouth every 6 (six) hours as needed for moderate pain.  30 tablet  0  . oxymetazoline (AFRIN NASAL SPRAY) 0.05 % nasal spray Place 1 spray into both nostrils 2 (two) times daily as needed for congestion.  30 mL  0  . traMADol (ULTRAM) 50 MG tablet Take 1 tablet (50 mg total) by mouth every 8 (eight) hours as needed.  60 tablet  1  . Vitamin D, Ergocalciferol, (DRISDOL) 50000 UNITS CAPS capsule Take 50,000 Units by mouth every 7 (seven) days.      . [DISCONTINUED] colchicine 0.6 MG tablet Take two tab PO, then take 1 tab PO 1 hour later  6 tablet  0   No current facility-administered medications for this visit.    Review of Systems : See HPI for pertinent positives and negatives.  Physical Examination  Filed Vitals:   01/30/14 0943 01/30/14 0946  BP: 99/67 100/67  Pulse: 88 89  Resp:  16  Height:  5\' 3"  (1.6 m)  Weight:  163 lb (73.936 kg)  SpO2:  100%   Body mass index is 28.88 kg/(m^2).  General: WDWN female in NAD GAIT: normal Eyes: PERRLA Pulmonary:  Non-labored, CTAB,decreased air movement in all fields, Negative  Rales, Negative rhonchi, & Negative wheezing.  Cardiac: regular Rhythm ,  Positive detected murmur.  VASCULAR EXAM Carotid Bruits Left Right   Positive, soft Positive, harsh   Radial pulses are 2+  palpable and equal.  LE Pulses LEFT RIGHT       POPLITEAL  not palpable   not palpable       POSTERIOR TIBIAL  2+ palpable   2+ palpable        DORSALIS PEDIS      ANTERIOR TIBIAL not palpable  2+ palpable     Gastrointestinal: soft, nontender, BS WNL, no r/g,  negative masses.  Musculoskeletal: Negative muscle atrophy/wasting. M/S 5/5 in left upper extremity, 4/5 in left lower extremity, 3/5 in right upper and lower extremities, Extremities without ischemic changes.  Neurologic: A&O X 3; Appropriate Affect ;  Speech is normal CN 2-12 intact, Pain and light touch intact in extremities, Motor exam as listed above.   Non-Invasive Vascular Imaging CAROTID DUPLEX 01/30/2014   CEREBROVASCULAR DUPLEX EVALUATION    INDICATION: Carotid disease     PREVIOUS INTERVENTION(S): Right carotid endarterectomy 07/19/2013    DUPLEX EXAM:     RIGHT  LEFT  Peak Systolic Velocities (cm/s) End Diastolic Velocities (cm/s) Plaque LOCATION Peak Systolic Velocities (cm/s) End Diastolic Velocities (cm/s) Plaque  214 27  CCA PROXIMAL 38 15 HM  120 25  CCA MID 57 24 HM  126 30  CCA DISTAL 47 20 HM  203 21  ECA 89 10 HT  137 39  ICA PROXIMAL 38 18 HM  178 59  ICA MID 84 36 HT  131 38  ICA DISTAL 71 31     NA ICA / CCA Ratio (PSV) NA  Antegrade  Vertebral Flow Antegrade   68 Brachial Systolic Pressure (mmHg) 84  Within normal limits  Brachial Artery Waveforms Within normal limits     Plaque Morphology:  HM = Homogeneous, HT = Heterogeneous, CP = Calcific Plaque, SP = Smooth Plaque, IP = Irregular Plaque     ADDITIONAL FINDINGS: Subclavian artery velocities (cm/second): R- 401, L- 503     IMPRESSION: 1. Widely patent right carotid endarterectomy without evidence of restenosis or hyperplasia.   2. Significantly dampened waveforms are present throughout the left common carotid  artery and internal carotid artery consistent with severe proximal stenosis. Soft disease is observed is lining the common carotid artery and at the bifurcation with calcific plaque in the mid internal carotid artery. Disease category cannot be calculated due to proximal disease.  3. Right vertebral artery is antegrade with abnormal waveforms.  4. Left vertebral artery is bidirectional indicating a pre-subclavian steal state.  5. Significant bilateral subclavian artery stenosis is present.      Compared to the previous exam:  No previous exam at this facility for comparison.       Assessment: WAFAA DEEMER is a 63 y.o. female who ris s/p right carotid endarterectomy with Dacron patch angioplasty on 07/19/2013 and presents with symptomatic widely patent right carotid endarterectomy without evidence of restenosis or hyperplasia.   Significantly dampened waveforms are present throughout the left common carotid artery and internal carotid artery consistent with severe proximal stenosis. Soft disease is observed is lining the common carotid artery and at the bifurcation with calcific plaque in the mid internal carotid artery. Disease category cannot be calculated due to proximal disease.  Right vertebral artery is antegrade with abnormal waveforms.  Left vertebral artery is bidirectional indicating a pre-subclavian steal state.  Significant bilateral subclavian artery stenosis is present.  No previous exam at this facility for comparison.    Plan: Based on today's exam and Duplex results, and after discussing with Dr. Scot Dock, and Dr. Scot Dock speaking with pt, will schedule  arch aortogram for 02/11/14, Dr. Scot Dock.  The patient was given information about stroke prevention and what symptoms should prompt the patient to seek immediate medical care. Thank you for allowing [place] to participate in this patient's care.  Clemon Chambers, RN, MSN, FNP-C [facility]  [place] Office: [phone]  Clinic Physician: Scot Dock  01/30/2014 9:17 AM

## 2014-02-11 NOTE — Telephone Encounter (Signed)
Message copied by Gena Fray on Mon Feb 11, 2014  4:09 PM ------      Message from: Peter Minium K      Created: Mon Feb 11, 2014 12:10 PM      Regarding: Schedule                   ----- Message -----         From: Angelia Mould, MD         Sent: 02/11/2014  11:18 AM           To: Vvs Charge Pool      Subject: charge                                                   PROCEDURE:        1. Ultrasound-guided access the right common femoral artery      2. Arch aortogram      3. Selective catheterization of the left subclavian artery with left subclavian arteriogram                  SURGEON: Judeth Cornfield. Scot Dock, MD, FACS            I have reviewed this case with Dr. Oneida Alar. He needs to see her in the office to discuss possible left proximal common carotid artery stenting. Thank you. CD ------

## 2014-02-11 NOTE — Telephone Encounter (Signed)
Spoke with patient, dpm °

## 2014-02-11 NOTE — Discharge Instructions (Signed)

## 2014-02-11 NOTE — Interval H&P Note (Signed)
History and Physical Interval Note:  02/11/2014 9:44 AM  Sarah Alvarez  has presented today for surgery, with the diagnosis of carotid subclavian stenosis  The various methods of treatment have been discussed with the patient and family. After consideration of risks, benefits and other options for treatment, the patient has consented to  Procedure(s): ARCH AORTOGRAM (N/A) CAROTID ANGIOGRAM (N/A) as a surgical intervention .  The patient's history has been reviewed, patient examined, no change in status, stable for surgery.  I have reviewed the patient's chart and labs.  Questions were answered to the patient's satisfaction.     DICKSON,CHRISTOPHER S

## 2014-02-26 ENCOUNTER — Encounter: Payer: Self-pay | Admitting: Internal Medicine

## 2014-02-27 ENCOUNTER — Encounter: Payer: Self-pay | Admitting: Vascular Surgery

## 2014-02-28 ENCOUNTER — Ambulatory Visit (INDEPENDENT_AMBULATORY_CARE_PROVIDER_SITE_OTHER): Payer: Medicaid Other | Admitting: Vascular Surgery

## 2014-02-28 ENCOUNTER — Encounter: Payer: Self-pay | Admitting: Vascular Surgery

## 2014-02-28 ENCOUNTER — Ambulatory Visit: Payer: Medicaid Other | Admitting: Vascular Surgery

## 2014-02-28 ENCOUNTER — Encounter (HOSPITAL_COMMUNITY): Payer: Self-pay | Admitting: Pharmacy Technician

## 2014-02-28 ENCOUNTER — Other Ambulatory Visit: Payer: Self-pay

## 2014-02-28 VITALS — BP 187/82 | HR 108 | Ht 62.0 in | Wt 161.0 lb

## 2014-02-28 DIAGNOSIS — I6529 Occlusion and stenosis of unspecified carotid artery: Secondary | ICD-10-CM

## 2014-02-28 NOTE — Progress Notes (Signed)
[facility]  HISTORY AND PHYSICAL   History of Present Illness:  Patient is a 63 y.o. year old female who presents for evaluation of intermittent weakness and tingling of right hand.  She was noted on recent arch aortogram to have a high-grade proximal left common carotid stenosis with poststenotic dilatation. She was referred for evaluation of possible common carotid stenting. She denies any recent stroke symptoms. She is on Plavix and aspirin.  Other medical problems include coronary artery disease, hypertension, hyperlipidemia, diabetes, chronic mesenteric ischemia (prior spear mesenteric artery stent), prior right carotid endarterectomy by Dr. Scot Dock.  Past Medical History  Diagnosis Date  . Staph infection   . Restless leg syndrome   . Chronic mesenteric ischemia     s/p SMA stent 06/22/13  . Coronary artery disease   . Hypertension   . Hyperlipidemia   . Diabetes mellitus     Type  II    Past Surgical History  Procedure Laterality Date  . Cholecystectomy    . Abdominal hysterectomy    . Coronary angioplasty      ? LCX stent ~ 2000 in [place], [place] with LCX stent [facility] 09/1999 for reported re-instent stenosis  . Endarterectomy Right 07/19/2013    Procedure: ENDARTERECTOMY CAROTID-RIGHT;  Surgeon: Angelia Mould, MD;  Location: [facility] OR;  Service: Vascular;  Laterality: Right;  . Carotid endarterectomy Right 07-19-13    Social History History  Substance Use Topics  . Smoking status: Former Smoker -- 1.00 packs/day for 20 years    Quit date: 06/21/2013  . Smokeless tobacco: Never Used  . Alcohol Use: No    Family History Family History  Problem Relation Age of Onset  . Diabetes Mother   . Stroke Mother   . Hyperlipidemia Mother   . Cancer Father   . Heart disease Father     PVD and  CAROTID  . Heart attack Father   . Diabetes Sister   . Hypertension Sister   . Heart disease Sister     Before age 53  . Hyperlipidemia Sister   .  Varicose Veins Sister   . Cancer Brother   . Cancer Maternal Aunt     breast cancer   . Diabetes Maternal Grandmother   . Diabetes Paternal Grandmother     Allergies  No Known Allergies   Current Outpatient Prescriptions  Medication Sig Dispense Refill  . acetaminophen (TYLENOL) 650 MG CR tablet Take 650 mg by mouth every 8 (eight) hours as needed for pain.      Marland Kitchen aspirin 325 MG tablet Take 1 tablet (325 mg total) by mouth daily.      Marland Kitchen atorvastatin (LIPITOR) 20 MG tablet Take 1 tablet (20 mg total) by mouth daily.  30 tablet  5  . benazepril (LOTENSIN) 5 MG tablet Take 2 tablets (10 mg total) by mouth every morning.  60 tablet  2  . clopidogrel (PLAVIX) 75 MG tablet Take 1 tablet (75 mg total) by mouth daily with breakfast.  30 tablet  3  . cyclobenzaprine (FLEXERIL) 5 MG tablet Take 5 mg by mouth 3 (three) times daily as needed for muscle spasms.      . famotidine (PEPCID) 20 MG tablet Take 20 mg by mouth 2 (two) times daily.      Marland Kitchen gabapentin (NEURONTIN) 300 MG capsule Take 300 mg by mouth at bedtime.      Marland Kitchen glipiZIDE (GLUCOTROL) 5 MG tablet Take 1 tablet (5 mg total) by mouth  2 (two) times daily before a meal.  60 tablet  2  . insulin glargine (LANTUS) 100 UNIT/ML injection Inject 60 Units into the skin at bedtime.      Marland Kitchen loratadine (CLARITIN) 10 MG tablet Take 1 tablet (10 mg total) by mouth daily as needed for allergies or rhinitis.  30 tablet  11  . metFORMIN (GLUMETZA) 500 MG (MOD) 24 hr tablet Take 1 tablet (500 mg total) by mouth 2 (two) times daily with a meal.  60 tablet  2  . oxymetazoline (AFRIN NASAL SPRAY) 0.05 % nasal spray Place 1 spray into both nostrils 2 (two) times daily as needed for congestion.  30 mL  0  . traMADol (ULTRAM) 50 MG tablet Take 50 mg by mouth every 8 (eight) hours as needed for moderate pain.      . Vitamin D, Ergocalciferol, (DRISDOL) 50000 UNITS CAPS capsule Take 50,000 Units by mouth every 7 (seven) days. sunday      . [DISCONTINUED] colchicine  0.6 MG tablet Take two tab PO, then take 1 tab PO 1 hour later  6 tablet  0   No current facility-administered medications for this visit.    ROS:   General:  No weight loss, Fever, chills  HEENT: No recent headaches, no nasal bleeding, no visual changes, no sore throat  Neurologic: No dizziness, blackouts, seizures.  No recent episodes of slurred speech, or temporary blindness.  Cardiac: No recent episodes of chest pain/pressure, no shortness of breath at rest.  + shortness of breath with exertion.  Denies history of atrial fibrillation or irregular heartbeat  Pulmonary: No home oxygen, no productive cough, no hemoptysis,  No asthma or wheezing   Physical Examination  Filed Vitals:   02/28/14 0834 02/28/14 0835  BP: 171/89 187/82  Pulse: 108   Height: 5\' 2"  (1.575 m)   Weight: 161 lb (73.029 kg)   SpO2: 100%     Body mass index is 29.44 kg/(m^2).  General:  Alert and oriented, no acute distress HEENT: Normal Neck: Bilateral carotid bruits Pulmonary: Clear to auscultation bilaterally Cardiac: Regular Rate and Rhythm with diffuse 3/6 murmur heard throughout the precordium Extremity Pulses:  2+ radial, brachial, femoral pulses bilaterally Musculoskeletal: No deformity or edema  Neurologic: Upper and lower extremity motor 5/5 and symmetric  DATA:  Recent angiogram was reviewed. Right common carotid artery is patent. Greater than 90% left common carotid origin stenosis with poststenotic dilatation, patent left subclavian artery with patent left vertebral artery   ASSESSMENT:  Patient with high-grade left proximal common carotid artery stenosis which may be symptomatic with episodes intermittently of right arm symptoms   PLAN:  Retrograde left common carotid stent via a left neck incision possible left subclavian to carotid bypass if stenting not possible at the time of procedure. Risks benefits possible complications and procedure details were discussed with the patient  today including but not limited to bleeding infection stroke risk of 2-5%, possible vessel injury requiring sternotomy, possible myocardial events. She understands and agrees to proceed. We will schedule this for Tuesday, August 25 as a combined procedure with Dr. Scot Dock.  Ruta Hinds, MD [facility] [place] Office: [phone] Pager: [phone]

## 2014-03-02 NOTE — Pre-Procedure Instructions (Addendum)
Sarah Alvarez  03/02/2014   Your procedure is scheduled on:  August 25  Report to [facility] Admitting at 05:30 AM.  Call this number if you have problems the morning of surgery: [phone]   Remember:   Do not eat food or drink liquids after midnight.   Take these medicines the morning of surgery with A SIP OF WATER: Tylenol (if needed), Flexeril (if needed), Pepcid, Gabapentin, Claritin, Tramadol (if needed), Afrin (if needed)  Take Aspirin and Plavix as instructed by Dr Oneida Alar.   STOP/ Do not take Aleve, Naproxen, Advil, Ibuprofen, Motrin, Vitamins, Herbs, or Supplements starting today   Do not wear jewelry, make-up or nail polish.  Do not wear lotions, powders, or perfumes. You may wear deodorant.  Do not shave 48 hours prior to surgery. Men may shave face and neck.  Do not bring valuables to the hospital.  [facility] is not responsible for any belongings or valuables.               Contacts, dentures or bridgework may not be worn into surgery.  Leave suitcase in the car. After surgery it may be brought to your room.  For patients admitted to the hospital, discharge time is determined by your  treatment team.               Patients discharged the day of surgery will not be allowed to drive home.  Name and phone number of your driver: [place] - Preparing for Surgery  Before surgery, you can play an important role.  Because skin is not sterile, your skin needs to be as free of germs as possible.  You can reduce the number of germs on you skin by washing with CHG (chlorahexidine gluconate) soap before surgery.  CHG is an antiseptic cleaner which kills germs and bonds with the skin to continue killing germs even after washing.  Please DO NOT use if you have an allergy to CHG or antibacterial soaps.  If your skin becomes reddened/irritated stop using the CHG and inform your nurse when you arrive at Short Stay.  Do not shave  (including legs and underarms) for at least 48 hours prior to the first CHG shower.  You may shave your face.  Please follow these instructions carefully:   1.  Shower with CHG Soap the night before surgery and the                                morning of Surgery.  2.  If you choose to wash your hair, wash your hair first as usual with your       normal shampoo.  3.  After you shampoo, rinse your hair and body thoroughly to remove the                      Shampoo.  4.  Use CHG as you would any other liquid soap.  You can apply chg directly       to the skin and wash gently with scrungie or a clean washcloth.  5.  Apply the CHG Soap to your body ONLY FROM THE NECK DOWN.        Do not use on open wounds or open sores.  Avoid contact with your eyes,       ears, mouth and genitals (private parts).  Wash genitals (private parts)  with your normal soap.  6.  Wash thoroughly, paying special attention to the area where your surgery        will be performed.  7.  Thoroughly rinse your body with warm water from the neck down.  8.  DO NOT shower/wash with your normal soap after using and rinsing off       the CHG Soap.  9.  Pat yourself dry with a clean towel.            10.  Wear clean pajamas.            11.  Place clean sheets on your bed the night of your first shower and do not        sleep with pets.  Day of Surgery  Do not apply any lotions/deoderants the morning of surgery.  Please wear clean clothes to the hospital/surgery center.      Please read over the following fact sheets that you were given: Pain Booklet, Coughing and Deep Breathing, Blood Transfusion Information and Surgical Site Infection Prevention

## 2014-03-04 ENCOUNTER — Encounter (HOSPITAL_COMMUNITY): Payer: Self-pay

## 2014-03-04 ENCOUNTER — Encounter (HOSPITAL_COMMUNITY): Payer: Self-pay | Admitting: Anesthesiology

## 2014-03-04 ENCOUNTER — Encounter (HOSPITAL_COMMUNITY)
Admission: RE | Admit: 2014-03-04 | Discharge: 2014-03-04 | Disposition: A | Discharge status: Home / Self Care | Payer: Medicaid Other | Source: Ambulatory Visit | Attending: Vascular Surgery | Admitting: Vascular Surgery

## 2014-03-04 LAB — CBC
HEMATOCRIT: 36.4 % (ref 36.0–46.0)
Hemoglobin: 12.7 g/dL (ref 12.0–15.0)
MCH: 30.7 pg (ref 26.0–34.0)
MCHC: 34.9 g/dL (ref 30.0–36.0)
MCV: 87.9 fL (ref 78.0–100.0)
Platelets: 296 10*3/uL (ref 150–400)
RBC: 4.14 MIL/uL (ref 3.87–5.11)
RDW: 13.7 % (ref 11.5–15.5)
WBC: 7.6 10*3/uL (ref 4.0–10.5)

## 2014-03-04 LAB — COMPREHENSIVE METABOLIC PANEL
ALK PHOS: 96 U/L (ref 39–117)
ALT: 7 U/L (ref 0–35)
AST: 13 U/L (ref 0–37)
Albumin: 4.1 g/dL (ref 3.5–5.2)
Anion gap: 13 (ref 5–15)
BILIRUBIN TOTAL: 0.2 mg/dL — AB (ref 0.3–1.2)
BUN: 15 mg/dL (ref 6–23)
CHLORIDE: 106 meq/L (ref 96–112)
CO2: 25 mEq/L (ref 19–32)
Calcium: 9.4 mg/dL (ref 8.4–10.5)
Creatinine, Ser: 0.78 mg/dL (ref 0.50–1.10)
GFR calc Af Amer: >90 mL/min (ref >90)
GFR, EST NON AFRICAN AMERICAN: 87 mL/min — AB (ref >90)
GLUCOSE: 117 mg/dL — AB (ref 70–99)
POTASSIUM: 4.8 meq/L (ref 3.7–5.3)
Sodium: 144 mEq/L (ref 137–147)
Total Protein: 6.7 g/dL (ref 6.0–8.3)

## 2014-03-04 LAB — URINE MICROSCOPIC-ADD ON

## 2014-03-04 LAB — URINALYSIS, ROUTINE W REFLEX MICROSCOPIC
GLUCOSE, UA: 250 mg/dL — AB
Ketones, ur: 15 mg/dL — AB
Nitrite: POSITIVE — AB
PH: 5 (ref 5.0–8.0)
Protein, ur: 30 mg/dL — AB
SPECIFIC GRAVITY, URINE: 1.027 (ref 1.005–1.030)
UROBILINOGEN UA: 0.2 mg/dL (ref 0.0–1.0)

## 2014-03-04 LAB — PROTIME-INR
INR: 0.98 (ref 0.00–1.49)
Prothrombin Time: 13 seconds (ref 11.6–15.2)

## 2014-03-04 LAB — SURGICAL PCR SCREEN
MRSA, PCR: NEGATIVE
Staphylococcus aureus: NEGATIVE

## 2014-03-04 LAB — TYPE AND SCREEN
ABO/RH(D): O POS
Antibody Screen: NEGATIVE

## 2014-03-04 LAB — APTT: aPTT: 27 seconds (ref 24–37)

## 2014-03-04 MED ORDER — DEXTROSE 5 % IV SOLN
1.5000 g | INTRAVENOUS | Status: AC
  Administered 2014-03-05: 1.5 g via INTRAVENOUS
  Filled 2014-03-04: qty 1.5

## 2014-03-05 ENCOUNTER — Inpatient Hospital Stay (HOSPITAL_COMMUNITY): Payer: Medicaid Other | Admitting: Certified Registered Nurse Anesthetist

## 2014-03-05 ENCOUNTER — Other Ambulatory Visit: Payer: Medicaid Other | Admitting: *Deleted

## 2014-03-05 ENCOUNTER — Encounter (HOSPITAL_COMMUNITY): Payer: Medicaid Other | Admitting: Certified Registered Nurse Anesthetist

## 2014-03-05 ENCOUNTER — Telehealth: Payer: Self-pay | Admitting: Vascular Surgery

## 2014-03-05 ENCOUNTER — Encounter (HOSPITAL_COMMUNITY)
Admission: RE | Disposition: A | Discharge status: Home / Self Care | Payer: Self-pay | Source: Ambulatory Visit | Attending: Vascular Surgery

## 2014-03-05 ENCOUNTER — Encounter (HOSPITAL_COMMUNITY): Payer: Self-pay | Admitting: *Deleted

## 2014-03-05 ENCOUNTER — Inpatient Hospital Stay (HOSPITAL_COMMUNITY)
Admission: RE | Admit: 2014-03-05 | Discharge: 2014-03-06 | DRG: 035 | Disposition: A | Discharge status: Home / Self Care | Payer: Medicaid Other | Source: Ambulatory Visit | Attending: Vascular Surgery | Admitting: Vascular Surgery

## 2014-03-05 DIAGNOSIS — E785 Hyperlipidemia, unspecified: Secondary | ICD-10-CM | POA: Diagnosis present

## 2014-03-05 DIAGNOSIS — Z87891 Personal history of nicotine dependence: Secondary | ICD-10-CM | POA: Diagnosis not present

## 2014-03-05 DIAGNOSIS — I6529 Occlusion and stenosis of unspecified carotid artery: Secondary | ICD-10-CM

## 2014-03-05 DIAGNOSIS — D62 Acute posthemorrhagic anemia: Secondary | ICD-10-CM | POA: Diagnosis not present

## 2014-03-05 DIAGNOSIS — Z9861 Coronary angioplasty status: Secondary | ICD-10-CM

## 2014-03-05 DIAGNOSIS — Z833 Family history of diabetes mellitus: Secondary | ICD-10-CM | POA: Diagnosis not present

## 2014-03-05 DIAGNOSIS — Z7982 Long term (current) use of aspirin: Secondary | ICD-10-CM | POA: Diagnosis not present

## 2014-03-05 DIAGNOSIS — I10 Essential (primary) hypertension: Secondary | ICD-10-CM | POA: Diagnosis present

## 2014-03-05 DIAGNOSIS — Z48812 Encounter for surgical aftercare following surgery on the circulatory system: Secondary | ICD-10-CM

## 2014-03-05 DIAGNOSIS — Z823 Family history of stroke: Secondary | ICD-10-CM | POA: Diagnosis not present

## 2014-03-05 DIAGNOSIS — I2510 Atherosclerotic heart disease of native coronary artery without angina pectoris: Secondary | ICD-10-CM | POA: Diagnosis present

## 2014-03-05 DIAGNOSIS — Z8249 Family history of ischemic heart disease and other diseases of the circulatory system: Secondary | ICD-10-CM | POA: Diagnosis not present

## 2014-03-05 DIAGNOSIS — Z794 Long term (current) use of insulin: Secondary | ICD-10-CM | POA: Diagnosis not present

## 2014-03-05 DIAGNOSIS — E119 Type 2 diabetes mellitus without complications: Secondary | ICD-10-CM | POA: Diagnosis present

## 2014-03-05 DIAGNOSIS — I6522 Occlusion and stenosis of left carotid artery: Secondary | ICD-10-CM

## 2014-03-05 DIAGNOSIS — Z7902 Long term (current) use of antithrombotics/antiplatelets: Secondary | ICD-10-CM

## 2014-03-05 DIAGNOSIS — N390 Urinary tract infection, site not specified: Secondary | ICD-10-CM | POA: Diagnosis present

## 2014-03-05 DIAGNOSIS — G2581 Restless legs syndrome: Secondary | ICD-10-CM | POA: Diagnosis present

## 2014-03-05 HISTORY — PX: INSERTION OF RETROGRADE CAROTID STENT: SHX5869

## 2014-03-05 LAB — GLUCOSE, CAPILLARY
GLUCOSE-CAPILLARY: 133 mg/dL — AB (ref 70–99)
GLUCOSE-CAPILLARY: 197 mg/dL — AB (ref 70–99)
Glucose-Capillary: 90 mg/dL (ref 70–99)

## 2014-03-05 SURGERY — INSERTION, STENT, ARTERY, CAROTID, RETROGRADE
Anesthesia: General | Site: Neck | Laterality: Left

## 2014-03-05 MED ORDER — GABAPENTIN 300 MG PO CAPS
300.0000 mg | ORAL_CAPSULE | Freq: Every day | ORAL | Status: DC
  Administered 2014-03-05: 300 mg via ORAL
  Filled 2014-03-05 (×2): qty 1

## 2014-03-05 MED ORDER — TRAMADOL HCL 50 MG PO TABS
50.0000 mg | ORAL_TABLET | Freq: Three times a day (TID) | ORAL | Status: DC | PRN
  Administered 2014-03-06: 50 mg via ORAL
  Filled 2014-03-05: qty 1

## 2014-03-05 MED ORDER — LIDOCAINE HCL (CARDIAC) 20 MG/ML IV SOLN
INTRAVENOUS | Status: DC | PRN
  Administered 2014-03-05: 70 mg via INTRAVENOUS

## 2014-03-05 MED ORDER — OXYCODONE-ACETAMINOPHEN 5-325 MG PO TABS
1.0000 | ORAL_TABLET | ORAL | Status: DC | PRN
  Administered 2014-03-05 – 2014-03-06 (×4): 2 via ORAL
  Filled 2014-03-05: qty 2
  Filled 2014-03-05: qty 1
  Filled 2014-03-05 (×3): qty 2

## 2014-03-05 MED ORDER — PHENYLEPHRINE 40 MCG/ML (10ML) SYRINGE FOR IV PUSH (FOR BLOOD PRESSURE SUPPORT)
PREFILLED_SYRINGE | INTRAVENOUS | Status: AC
  Filled 2014-03-05: qty 10

## 2014-03-05 MED ORDER — CIPROFLOXACIN IN D5W 400 MG/200ML IV SOLN
400.0000 mg | INTRAVENOUS | Status: AC
  Administered 2014-03-05: 400 mg via INTRAVENOUS
  Filled 2014-03-05: qty 200

## 2014-03-05 MED ORDER — 0.9 % SODIUM CHLORIDE (POUR BTL) OPTIME
TOPICAL | Status: DC | PRN
  Administered 2014-03-05: 2000 mL

## 2014-03-05 MED ORDER — PHENOL 1.4 % MT LIQD: 1.0000 | OROMUCOSAL | Status: DC | PRN

## 2014-03-05 MED ORDER — ONDANSETRON HCL 4 MG/2ML IJ SOLN
INTRAMUSCULAR | Status: AC
  Filled 2014-03-05: qty 2

## 2014-03-05 MED ORDER — LORATADINE 10 MG PO TABS
10.0000 mg | ORAL_TABLET | Freq: Every day | ORAL | Status: DC | PRN
  Filled 2014-03-05: qty 1

## 2014-03-05 MED ORDER — ARTIFICIAL TEARS OP OINT
TOPICAL_OINTMENT | OPHTHALMIC | Status: AC
  Filled 2014-03-05: qty 3.5

## 2014-03-05 MED ORDER — SUCCINYLCHOLINE CHLORIDE 20 MG/ML IJ SOLN
INTRAMUSCULAR | Status: AC
  Filled 2014-03-05: qty 1

## 2014-03-05 MED ORDER — ASPIRIN EC 325 MG PO TBEC
325.0000 mg | DELAYED_RELEASE_TABLET | Freq: Every day | ORAL | Status: DC
  Administered 2014-03-06: 325 mg via ORAL
  Filled 2014-03-05: qty 1

## 2014-03-05 MED ORDER — FENTANYL CITRATE 0.05 MG/ML IJ SOLN
INTRAMUSCULAR | Status: AC
  Filled 2014-03-05: qty 5

## 2014-03-05 MED ORDER — NEOSTIGMINE METHYLSULFATE 10 MG/10ML IV SOLN
INTRAVENOUS | Status: AC
  Filled 2014-03-05: qty 1

## 2014-03-05 MED ORDER — ASPIRIN 325 MG PO TABS: 325.0000 mg | ORAL_TABLET | Freq: Every day | ORAL | Status: DC

## 2014-03-05 MED ORDER — ROCURONIUM BROMIDE 50 MG/5ML IV SOLN
INTRAVENOUS | Status: AC
  Filled 2014-03-05: qty 1

## 2014-03-05 MED ORDER — FENTANYL CITRATE 0.05 MG/ML IJ SOLN
INTRAMUSCULAR | Status: DC | PRN
  Administered 2014-03-05: 50 ug via INTRAVENOUS
  Administered 2014-03-05: 100 ug via INTRAVENOUS

## 2014-03-05 MED ORDER — DOPAMINE-DEXTROSE 3.2-5 MG/ML-% IV SOLN: 3.0000 ug/kg/min | INTRAVENOUS | Status: DC

## 2014-03-05 MED ORDER — PHENYLEPHRINE HCL 10 MG/ML IJ SOLN
10.0000 mg | INTRAVENOUS | Status: DC | PRN
  Administered 2014-03-05: 20 ug/min via INTRAVENOUS

## 2014-03-05 MED ORDER — ALUM & MAG HYDROXIDE-SIMETH 200-200-20 MG/5ML PO SUSP: 15.0000 mL | ORAL | Status: DC | PRN

## 2014-03-05 MED ORDER — PROPOFOL 10 MG/ML IV BOLUS
INTRAVENOUS | Status: AC
  Filled 2014-03-05: qty 20

## 2014-03-05 MED ORDER — PROPOFOL 10 MG/ML IV BOLUS
INTRAVENOUS | Status: DC | PRN
  Administered 2014-03-05: 130 mg via INTRAVENOUS

## 2014-03-05 MED ORDER — SODIUM CHLORIDE 0.9 % IJ SOLN
3.0000 mL | Freq: Two times a day (BID) | INTRAMUSCULAR | Status: DC
  Administered 2014-03-06: 3 mL via INTRAVENOUS

## 2014-03-05 MED ORDER — EPHEDRINE SULFATE 50 MG/ML IJ SOLN
INTRAMUSCULAR | Status: DC | PRN
  Administered 2014-03-05: 5 mg via INTRAVENOUS
  Administered 2014-03-05: 10 mg via INTRAVENOUS

## 2014-03-05 MED ORDER — EPHEDRINE SULFATE 50 MG/ML IJ SOLN
INTRAMUSCULAR | Status: AC
  Filled 2014-03-05: qty 1

## 2014-03-05 MED ORDER — GLIPIZIDE 5 MG PO TABS
5.0000 mg | ORAL_TABLET | Freq: Two times a day (BID) | ORAL | Status: DC
  Administered 2014-03-06: 5 mg via ORAL
  Filled 2014-03-05 (×4): qty 1

## 2014-03-05 MED ORDER — DOCUSATE SODIUM 100 MG PO CAPS
100.0000 mg | ORAL_CAPSULE | Freq: Every day | ORAL | Status: DC
  Administered 2014-03-06: 100 mg via ORAL
  Filled 2014-03-05: qty 1

## 2014-03-05 MED ORDER — ACETAMINOPHEN 325 MG RE SUPP
325.0000 mg | RECTAL | Status: DC | PRN
  Filled 2014-03-05: qty 2

## 2014-03-05 MED ORDER — MAGNESIUM SULFATE 40 MG/ML IJ SOLN: 2.0000 g | Freq: Every day | INTRAMUSCULAR | Status: DC | PRN

## 2014-03-05 MED ORDER — FAMOTIDINE 20 MG PO TABS
20.0000 mg | ORAL_TABLET | Freq: Two times a day (BID) | ORAL | Status: DC
  Administered 2014-03-05 – 2014-03-06 (×2): 20 mg via ORAL
  Filled 2014-03-05 (×3): qty 1

## 2014-03-05 MED ORDER — PANTOPRAZOLE SODIUM 40 MG PO TBEC: 40.0000 mg | DELAYED_RELEASE_TABLET | Freq: Every day | ORAL | Status: DC

## 2014-03-05 MED ORDER — HYDRALAZINE HCL 20 MG/ML IJ SOLN: 10.0000 mg | INTRAMUSCULAR | Status: DC | PRN

## 2014-03-05 MED ORDER — METOPROLOL TARTRATE 1 MG/ML IV SOLN: 2.0000 mg | INTRAVENOUS | Status: DC | PRN

## 2014-03-05 MED ORDER — MIDAZOLAM HCL 2 MG/2ML IJ SOLN
INTRAMUSCULAR | Status: AC
  Filled 2014-03-05: qty 2

## 2014-03-05 MED ORDER — FENTANYL CITRATE 0.05 MG/ML IJ SOLN
INTRAMUSCULAR | Status: AC
  Filled 2014-03-05: qty 2

## 2014-03-05 MED ORDER — ONDANSETRON HCL 4 MG/2ML IJ SOLN
INTRAMUSCULAR | Status: DC | PRN
  Administered 2014-03-05: 4 mg via INTRAVENOUS

## 2014-03-05 MED ORDER — NEOSTIGMINE METHYLSULFATE 10 MG/10ML IV SOLN
INTRAVENOUS | Status: DC | PRN
  Administered 2014-03-05: 4 mg via INTRAVENOUS

## 2014-03-05 MED ORDER — POTASSIUM CHLORIDE CRYS ER 20 MEQ PO TBCR: 20.0000 meq | EXTENDED_RELEASE_TABLET | Freq: Every day | ORAL | Status: DC | PRN

## 2014-03-05 MED ORDER — GLYCOPYRROLATE 0.2 MG/ML IJ SOLN
INTRAMUSCULAR | Status: DC | PRN
  Administered 2014-03-05: 0.6 mg via INTRAVENOUS

## 2014-03-05 MED ORDER — LACTATED RINGERS IV SOLN
INTRAVENOUS | Status: DC | PRN
  Administered 2014-03-05: 07:00:00 via INTRAVENOUS

## 2014-03-05 MED ORDER — SODIUM CHLORIDE 0.9 % IV SOLN: 250.0000 mL | INTRAVENOUS | Status: DC | PRN

## 2014-03-05 MED ORDER — ACETAMINOPHEN 325 MG PO TABS: 325.0000 mg | ORAL_TABLET | ORAL | Status: DC | PRN

## 2014-03-05 MED ORDER — OXYMETAZOLINE HCL 0.05 % NA SOLN
1.0000 | Freq: Two times a day (BID) | NASAL | Status: DC | PRN
Start: 2014-03-05 — End: 2014-03-06
  Filled 2014-03-05: qty 15

## 2014-03-05 MED ORDER — ROCURONIUM BROMIDE 100 MG/10ML IV SOLN
INTRAVENOUS | Status: DC | PRN
  Administered 2014-03-05: 40 mg via INTRAVENOUS

## 2014-03-05 MED ORDER — CLOPIDOGREL BISULFATE 75 MG PO TABS
75.0000 mg | ORAL_TABLET | Freq: Every day | ORAL | Status: DC
  Administered 2014-03-06: 75 mg via ORAL
  Filled 2014-03-05 (×4): qty 1

## 2014-03-05 MED ORDER — PHENYLEPHRINE HCL 10 MG/ML IJ SOLN
INTRAMUSCULAR | Status: DC | PRN
  Administered 2014-03-05 (×2): 40 ug via INTRAVENOUS

## 2014-03-05 MED ORDER — SODIUM CHLORIDE 0.9 % IR SOLN
Status: DC | PRN
  Administered 2014-03-05 (×2)

## 2014-03-05 MED ORDER — SODIUM CHLORIDE 0.9 % IV SOLN
Freq: Once | INTRAVENOUS | Status: AC
  Administered 2014-03-05: 15:00:00 via INTRAVENOUS

## 2014-03-05 MED ORDER — CHLORHEXIDINE GLUCONATE CLOTH 2 % EX PADS: 6.0000 | MEDICATED_PAD | Freq: Once | CUTANEOUS | Status: DC

## 2014-03-05 MED ORDER — CIPROFLOXACIN HCL 500 MG PO TABS
500.0000 mg | ORAL_TABLET | Freq: Two times a day (BID) | ORAL | Status: DC
  Administered 2014-03-05 – 2014-03-06 (×2): 500 mg via ORAL
  Filled 2014-03-05 (×4): qty 1

## 2014-03-05 MED ORDER — HEPARIN SODIUM (PORCINE) 1000 UNIT/ML IJ SOLN
INTRAMUSCULAR | Status: DC | PRN
  Administered 2014-03-05: 7 mL via INTRAVENOUS

## 2014-03-05 MED ORDER — CYCLOBENZAPRINE HCL 5 MG PO TABS
5.0000 mg | ORAL_TABLET | Freq: Three times a day (TID) | ORAL | Status: DC | PRN
  Filled 2014-03-05: qty 1

## 2014-03-05 MED ORDER — BENAZEPRIL HCL 10 MG PO TABS
10.0000 mg | ORAL_TABLET | Freq: Every morning | ORAL | Status: DC
  Administered 2014-03-06: 10 mg via ORAL
  Filled 2014-03-05: qty 1

## 2014-03-05 MED ORDER — LABETALOL HCL 5 MG/ML IV SOLN: 10.0000 mg | INTRAVENOUS | Status: DC | PRN

## 2014-03-05 MED ORDER — SODIUM CHLORIDE 0.9 % IV SOLN
500.0000 mL | Freq: Once | INTRAVENOUS | Status: AC | PRN
  Administered 2014-03-05 (×2): 500 mL via INTRAVENOUS

## 2014-03-05 MED ORDER — FENTANYL CITRATE 0.05 MG/ML IJ SOLN
25.0000 ug | INTRAMUSCULAR | Status: DC | PRN
  Administered 2014-03-05: 50 ug via INTRAVENOUS
  Administered 2014-03-05 (×2): 25 ug via INTRAVENOUS

## 2014-03-05 MED ORDER — ACETAMINOPHEN 160 MG/5ML PO SOLN
325.0000 mg | ORAL | Status: DC | PRN
  Filled 2014-03-05: qty 20.3

## 2014-03-05 MED ORDER — MIDAZOLAM HCL 5 MG/5ML IJ SOLN
INTRAMUSCULAR | Status: DC | PRN
  Administered 2014-03-05: 0.5 mg via INTRAVENOUS

## 2014-03-05 MED ORDER — ONDANSETRON HCL 4 MG/2ML IJ SOLN: 4.0000 mg | Freq: Four times a day (QID) | INTRAMUSCULAR | Status: DC | PRN

## 2014-03-05 MED ORDER — VITAMIN D (ERGOCALCIFEROL) 1.25 MG (50000 UNIT) PO CAPS: 50000.0000 [IU] | ORAL_CAPSULE | ORAL | Status: DC

## 2014-03-05 MED ORDER — SODIUM CHLORIDE 0.9 % IV SOLN: INTRAVENOUS | Status: DC

## 2014-03-05 MED ORDER — DEXTROSE 5 % IV SOLN
1.5000 g | Freq: Two times a day (BID) | INTRAVENOUS | Status: DC
  Administered 2014-03-06: 1.5 g via INTRAVENOUS
  Filled 2014-03-05 (×2): qty 1.5

## 2014-03-05 MED ORDER — IODIXANOL 320 MG/ML IV SOLN
INTRAVENOUS | Status: DC | PRN
  Administered 2014-03-05: 55 mL via INTRA_ARTERIAL

## 2014-03-05 MED ORDER — PROTAMINE SULFATE 10 MG/ML IV SOLN
INTRAVENOUS | Status: DC | PRN
  Administered 2014-03-05: 70 mg via INTRAVENOUS

## 2014-03-05 MED ORDER — INSULIN GLARGINE 100 UNIT/ML ~~LOC~~ SOLN
60.0000 [IU] | Freq: Every day | SUBCUTANEOUS | Status: DC
  Administered 2014-03-05: 60 [IU] via SUBCUTANEOUS
  Filled 2014-03-05 (×2): qty 0.6

## 2014-03-05 MED ORDER — MORPHINE SULFATE 2 MG/ML IJ SOLN: 2.0000 mg | INTRAMUSCULAR | Status: DC | PRN

## 2014-03-05 MED ORDER — GLYCOPYRROLATE 0.2 MG/ML IJ SOLN
INTRAMUSCULAR | Status: AC
  Filled 2014-03-05: qty 2

## 2014-03-05 MED ORDER — SODIUM CHLORIDE 0.9 % IJ SOLN: 3.0000 mL | INTRAMUSCULAR | Status: DC | PRN

## 2014-03-05 MED ORDER — ATORVASTATIN CALCIUM 20 MG PO TABS
20.0000 mg | ORAL_TABLET | Freq: Every day | ORAL | Status: DC
  Administered 2014-03-05: 20 mg via ORAL
  Filled 2014-03-05 (×2): qty 1

## 2014-03-05 MED ORDER — GUAIFENESIN-DM 100-10 MG/5ML PO SYRP: 15.0000 mL | ORAL_SOLUTION | ORAL | Status: DC | PRN

## 2014-03-05 SURGICAL SUPPLY — 67 items
ADH SKN CLS APL DERMABOND .7 (GAUZE/BANDAGES/DRESSINGS) ×3
BAG BANDED W/RUBBER/TAPE 36X54 (MISCELLANEOUS) ×6 IMPLANT
BAG EQP BAND 135X91 W/RBR TAPE (MISCELLANEOUS) ×6
BLADE 10 SAFETY STRL DISP (BLADE) ×4 IMPLANT
BLADE SURG 10 STRL SS (BLADE) ×4 IMPLANT
CANISTER SUCTION 2500CC (MISCELLANEOUS) ×4 IMPLANT
CATH ROBINSON RED A/P 18FR (CATHETERS) ×4 IMPLANT
CLIP TI MEDIUM 24 (CLIP) ×4 IMPLANT
CLIP TI WIDE RED SMALL 24 (CLIP) ×4 IMPLANT
COVER DOME SNAP 22 D (MISCELLANEOUS) ×6 IMPLANT
COVER SURGICAL LIGHT HANDLE (MISCELLANEOUS) ×4 IMPLANT
CRADLE DONUT ADULT HEAD (MISCELLANEOUS) ×4 IMPLANT
DECANTER SPIKE VIAL GLASS SM (MISCELLANEOUS) IMPLANT
DERMABOND ADVANCED (GAUZE/BANDAGES/DRESSINGS) ×1
DERMABOND ADVANCED .7 DNX12 (GAUZE/BANDAGES/DRESSINGS) ×3 IMPLANT
DEVICE TORQUE 50000 (MISCELLANEOUS) IMPLANT
DRAIN CHANNEL 15F RND FF W/TCR (WOUND CARE) IMPLANT
DRAIN HEMOVAC 1/8 X 5 (WOUND CARE) IMPLANT
DRAPE WARM FLUID 44X44 (DRAPE) ×1 IMPLANT
DRSG TEGADERM 4X4.75 (GAUZE/BANDAGES/DRESSINGS) ×3 IMPLANT
ELECT REM PT RETURN 9FT ADLT (ELECTROSURGICAL) ×4
ELECTRODE REM PT RTRN 9FT ADLT (ELECTROSURGICAL) ×3 IMPLANT
EVACUATOR SILICONE 100CC (DRAIN) IMPLANT
GAUZE SPONGE 4X4 12PLY STRL (GAUZE/BANDAGES/DRESSINGS) ×4 IMPLANT
GLOVE BIO SURGEON STRL SZ7.5 (GLOVE) ×4 IMPLANT
GLOVE BIOGEL PI IND STRL 7.5 (GLOVE) ×2 IMPLANT
GLOVE BIOGEL PI IND STRL 8 (GLOVE) ×2 IMPLANT
GLOVE BIOGEL PI INDICATOR 7.5 (GLOVE) ×1
GLOVE BIOGEL PI INDICATOR 8 (GLOVE) ×1
GOWN STRL REUS W/ TWL LRG LVL3 (GOWN DISPOSABLE) ×9 IMPLANT
GOWN STRL REUS W/TWL LRG LVL3 (GOWN DISPOSABLE) ×12
GUIDEWIRE ANGLED .035X150CM (WIRE) IMPLANT
GUIDEWIRE WHOLEY HI TOR 145CM (WIRE) ×3 IMPLANT
Genesis Transhepatic Biliary Stent  7mm x 29mm IMPLANT
Genesis Transhepatic Biliary Stent 7mm x  29mm (Vascular Products) ×3 IMPLANT
INSERT FOGARTY SM (MISCELLANEOUS) ×6 IMPLANT
KIT BASIN OR (CUSTOM PROCEDURE TRAY) ×4 IMPLANT
KIT ENCORE 26 ADVANTAGE (KITS) ×4 IMPLANT
KIT ROOM TURNOVER OR (KITS) ×4 IMPLANT
LOOP VESSEL MINI RED (MISCELLANEOUS) IMPLANT
NDL PERC 18GX7CM (NEEDLE) ×1 IMPLANT
NEEDLE 22X1 1/2 (OR ONLY) (NEEDLE) IMPLANT
NEEDLE PERC 18GX7CM (NEEDLE) ×4 IMPLANT
NS IRRIG 1000ML POUR BTL (IV SOLUTION) ×8 IMPLANT
PACK CAROTID (CUSTOM PROCEDURE TRAY) ×4 IMPLANT
PAD ARMBOARD 7.5X6 YLW CONV (MISCELLANEOUS) ×8 IMPLANT
SHEATH AVANTI 11CM 5FR (MISCELLANEOUS) IMPLANT
SHEATH BRITE TIP 7FR 35CM (SHEATH) ×3 IMPLANT
SHEATH PINNACLE R/O II 7F 4CM (SHEATH) IMPLANT
SHUNT CAROTID BYPASS 10 (VASCULAR PRODUCTS) IMPLANT
SHUNT CAROTID BYPASS 12FRX15.5 (VASCULAR PRODUCTS) IMPLANT
SPONGE INTESTINAL PEANUT (DISPOSABLE) ×7 IMPLANT
SPONGE SURGIFOAM ABS GEL 100 (HEMOSTASIS) IMPLANT
STENT GENESIS OPTA 7X29X80 (Permanent Stent) ×6 IMPLANT
SUT MNCRL AB 4-0 PS2 18 (SUTURE) ×4 IMPLANT
SUT PROLENE 6 0 CC (SUTURE) ×11 IMPLANT
SUT PROLENE 7 0 BV 1 (SUTURE) IMPLANT
SUT VIC AB 3-0 SH 27 (SUTURE) ×4
SUT VIC AB 3-0 SH 27X BRD (SUTURE) ×3 IMPLANT
SUT VICRYL 4-0 PS2 18IN ABS (SUTURE) ×4 IMPLANT
SYR CONTROL 10ML LL (SYRINGE) IMPLANT
SYRINGE 10CC LL (SYRINGE) ×8 IMPLANT
TOWEL OR 17X24 6PK STRL BLUE (TOWEL DISPOSABLE) ×4 IMPLANT
TOWEL OR 17X26 10 PK STRL BLUE (TOWEL DISPOSABLE) ×4 IMPLANT
TRAY FOLEY CATH 16FRSI W/METER (SET/KITS/TRAYS/PACK) ×4 IMPLANT
WATER STERILE IRR 1000ML POUR (IV SOLUTION) ×4 IMPLANT
WIRE ROSEN 145CM (WIRE) IMPLANT

## 2014-03-05 NOTE — Transfer of Care (Signed)
Immediate Anesthesia Transfer of Care Note  Patient: Sarah Alvarez  Procedure(s) Performed: Procedure(s): 1)Cutdown of open exposure left common carotid; 2) Retrograde left common carotid stent; 3) Repair of left common carotid artery. (Left)  Patient Location: PACU  Anesthesia Type:General  Level of Consciousness: awake, alert  and oriented  Airway & Oxygen Therapy: Patient Spontanous Breathing and Patient connected to face mask oxygen  Post-op Assessment: Report given to PACU RN  Post vital signs: Reviewed and stable  Complications: No apparent anesthesia complications

## 2014-03-05 NOTE — Progress Notes (Signed)
  Day of Surgery Note    Subjective:  No complaints-wants food  Filed Vitals:   03/05/14 1430  BP: 108/59  Pulse: 86  Temp:   Resp: 28    Incisions:   C/d/i without hematoma Extremities:  Upper and lower extremity 5/5 Cardiac:  regular Lungs:  Non labored Neuro:  In tact  Assessment/Plan:  This is a 63 y.o. female who is s/p Retrograde left common carotid stent, primary repair left common carotid artery   -pt is doing well this afternoon in PACU. -she has received 2L total fluid boluses.  Systolic BP now 256 -should transfer to [place] soon -anticipate discharge tomorrow morning   Leontine Locket, PA-C 03/05/2014 3:19 PM

## 2014-03-05 NOTE — Progress Notes (Signed)
Dr. Oneida Alar at bedside to check on patient post op. Asked him if he wanted to go by cuff pressure or Arterial line, he stated go by cuff pressure.

## 2014-03-05 NOTE — Telephone Encounter (Addendum)
Message copied by Doristine Section on Tue Mar 05, 2014 10:48 AM ------      Message from: Mena Goes      Created: Tue Mar 05, 2014 10:02 AM      Regarding: schedule                   ----- Message -----         From: Elam Dutch, MD         Sent: 03/05/2014   9:33 AM           To: Vvs Charge Pool            Co surgeon Scot Dock      He will dictate the exposure and repair of artery      I dictated the stent portion            Pt needs follow up with me in 2 weeks with carotid duplex at that office visit.            Ruta Hinds, MD      [facility] [place]      Office: [phone]      Pager: [phone]       ------  notified patient of post op appt. on 03-21-14 at 11 am for carotid duplex and 12:30 to see dr. Oneida Alar

## 2014-03-05 NOTE — Anesthesia Preprocedure Evaluation (Addendum)
Anesthesia Evaluation  Patient identified by MRN, date of birth, ID band Patient awake    Reviewed: Allergy & Precautions, H&P , NPO status , Patient's Chart, lab work & pertinent test results, reviewed documented beta blocker date and time   Airway Mallampati: II TM Distance: >3 FB Neck ROM: Full    Dental  (+) Edentulous Upper, Edentulous Lower   Pulmonary former smoker,    Pulmonary exam normal       Cardiovascular hypertension, + CAD and + Peripheral Vascular Disease     Neuro/Psych    GI/Hepatic   Endo/Other  diabetes, Well Controlled, Type 2  Renal/GU      Musculoskeletal   Abdominal Normal abdominal exam  (+)   Peds  Hematology   Anesthesia Other Findings   Reproductive/Obstetrics                          Anesthesia Physical Anesthesia Plan  ASA: III  Anesthesia Plan: General   Post-op Pain Management:    Induction: Intravenous  Airway Management Planned: Oral ETT  Additional Equipment: Arterial line, CVP and Ultrasound Guidance Line Placement  Intra-op Plan:   Post-operative Plan: Extubation in OR and Possible Post-op intubation/ventilation  Informed Consent: I have reviewed the patients History and Physical, chart, labs and discussed the procedure including the risks, benefits and alternatives for the proposed anesthesia with the patient or authorized representative who has indicated his/her understanding and acceptance.   Dental advisory given  Plan Discussed with: CRNA, Anesthesiologist and Surgeon  Anesthesia Plan Comments:        Anesthesia Quick Evaluation

## 2014-03-05 NOTE — Progress Notes (Signed)
Pt awake and alert in PACU.  UE/LE 5/5 motor.  No slurred speech.  No hematoma.  D/c aline To 3S soon  Ruta Hinds, MD [facility] [place] Office: [phone] Pager: [phone]

## 2014-03-05 NOTE — Progress Notes (Signed)
Patient C/O numbness in bilateral lower extremities below the knees.  States that her feet feel heavy.  MD on call paged.  Spoke to Dr Scot Dock about the findings.  Will continue to observe.

## 2014-03-05 NOTE — Op Note (Signed)
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  Co-surgeon: Gae Gallop M.D.  Operative findings: #1 7 x 30 balloon expandable stent left common carotid origin  Operative details: After obtaining informed consent, the patient was taken to the operating room. The patient was placed in supine position the operating room table. After induction of general anesthesia and endotracheal intubation, a Foley catheter was placed and the patient's entire neck and chest were prepped and draped in usual sterile fashion. Next an oblique incision was made at the base of the left neck. The exposure and dissection and repair of the left common carotid artery as dictated as a separate operative note by Dr. Scot Dock. The patient was given 7000 units of intravenous heparin. The common carotid artery was controlled with a Gregory clamp just below the carotid bifurcation. An introducer needle was used to cannulate the left common carotid artery in a retrograde fashion. An 035 versacore wire was threaded down into the ascending aorta under fluoroscopic guidance. The 7 French bright tip short sheath was then placed over the guidewire. The guidewire crossed the common carotid stenosis easily. A retrograde contrast angiogram was then performed. This showed a high-grade greater than 80% stenosis of the origin of the left common carotid artery with some poststenotic dilatation. An additional view was obtained in a 40 LAO projection to clearly see the origin of the artery. This was then marked and measurements made for selection of the stent. A 7 x 30 mm balloon expandable stent was selected. This was then advanced over the guidewire into the left common carotid artery. At this point the sheath pulled back and the stent became kinked. Therefore the  guidewire was left in place and the sheath was then removed and a new 7 French bright tip sheath brought in operative field and advanced back in the left common carotid artery and a new balloon expandable stent advanced over this. The stent was advanced so the tip extended just into the aortic arch. This was then deployed to 8 atmospheres and immediately deflated. A completion arteriogram was performed which showed a widely patent left common carotid artery with the stent well post of the wall and no significant residual stenosis. There is no evidence of dissection. At this point the sheath and guidewire was removed and control the common carotid artery obtained with a baby Gregory clamp. The repair and closure of the incision as dictated as a separate operative note by Dr. Scot Dock. The patient tolerated the procedure well and there were no complications. The patient was awakened in the operating room moving all extremities and following commands and appeared neurologically intact. She was taken to the recovery area in stable condition.  Ruta Hinds, MD [facility] [place] Office: [phone] Pager: [phone]

## 2014-03-05 NOTE — H&P (View-Only) (Signed)
[facility] [place] HISTORY AND PHYSICAL   History of Present Illness:  Patient is a 63 y.o. year old female who presents for evaluation of intermittent weakness and tingling of right hand.  She was noted on recent arch aortogram to have a high-grade proximal left common carotid stenosis with poststenotic dilatation. She was referred for evaluation of possible common carotid stenting. She denies any recent stroke symptoms. She is on Plavix and aspirin.  Other medical problems include coronary artery disease, hypertension, hyperlipidemia, diabetes, chronic mesenteric ischemia (prior spear mesenteric artery stent), prior right carotid endarterectomy by Dr. Scot Dock.  Past Medical History  Diagnosis Date  . Staph infection   . Restless leg syndrome   . Chronic mesenteric ischemia     s/p SMA stent 06/22/13  . Coronary artery disease   . Hypertension   . Hyperlipidemia   . Diabetes mellitus     Type  II    Past Surgical History  Procedure Laterality Date  . Cholecystectomy    . Abdominal hysterectomy    . Coronary angioplasty      ? LCX stent ~ 2000 in [place], [place] with LCX stent [facility] 09/1999 for reported re-instent stenosis  . Endarterectomy Right 07/19/2013    Procedure: ENDARTERECTOMY CAROTID-RIGHT;  Surgeon: Angelia Mould, MD;  Location: [facility] OR;  Service: Vascular;  Laterality: Right;  . Carotid endarterectomy Right 07-19-13    Social History History  Substance Use Topics  . Smoking status: Former Smoker -- 1.00 packs/day for 20 years    Quit date: 06/21/2013  . Smokeless tobacco: Never Used  . Alcohol Use: No    Family History Family History  Problem Relation Age of Onset  . Diabetes Mother   . Stroke Mother   . Hyperlipidemia Mother   . Cancer Father   . Heart disease Father     PVD and  CAROTID  . Heart attack Father   . Diabetes Sister   . Hypertension Sister   . Heart disease Sister     Before age 90  . Hyperlipidemia Sister   .  Varicose Veins Sister   . Cancer Brother   . Cancer Maternal Aunt     breast cancer   . Diabetes Maternal Grandmother   . Diabetes Paternal Grandmother     Allergies  No Known Allergies   Current Outpatient Prescriptions  Medication Sig Dispense Refill  . acetaminophen (TYLENOL) 650 MG CR tablet Take 650 mg by mouth every 8 (eight) hours as needed for pain.      Marland Kitchen aspirin 325 MG tablet Take 1 tablet (325 mg total) by mouth daily.      Marland Kitchen atorvastatin (LIPITOR) 20 MG tablet Take 1 tablet (20 mg total) by mouth daily.  30 tablet  5  . benazepril (LOTENSIN) 5 MG tablet Take 2 tablets (10 mg total) by mouth every morning.  60 tablet  2  . clopidogrel (PLAVIX) 75 MG tablet Take 1 tablet (75 mg total) by mouth daily with breakfast.  30 tablet  3  . cyclobenzaprine (FLEXERIL) 5 MG tablet Take 5 mg by mouth 3 (three) times daily as needed for muscle spasms.      . famotidine (PEPCID) 20 MG tablet Take 20 mg by mouth 2 (two) times daily.      Marland Kitchen gabapentin (NEURONTIN) 300 MG capsule Take 300 mg by mouth at bedtime.      Marland Kitchen glipiZIDE (GLUCOTROL) 5 MG tablet Take 1 tablet (5 mg total) by mouth  2 (two) times daily before a meal.  60 tablet  2  . insulin glargine (LANTUS) 100 UNIT/ML injection Inject 60 Units into the skin at bedtime.      Marland Kitchen loratadine (CLARITIN) 10 MG tablet Take 1 tablet (10 mg total) by mouth daily as needed for allergies or rhinitis.  30 tablet  11  . metFORMIN (GLUMETZA) 500 MG (MOD) 24 hr tablet Take 1 tablet (500 mg total) by mouth 2 (two) times daily with a meal.  60 tablet  2  . oxymetazoline (AFRIN NASAL SPRAY) 0.05 % nasal spray Place 1 spray into both nostrils 2 (two) times daily as needed for congestion.  30 mL  0  . traMADol (ULTRAM) 50 MG tablet Take 50 mg by mouth every 8 (eight) hours as needed for moderate pain.      . Vitamin D, Ergocalciferol, (DRISDOL) 50000 UNITS CAPS capsule Take 50,000 Units by mouth every 7 (seven) days. sunday      . [DISCONTINUED] colchicine  0.6 MG tablet Take two tab PO, then take 1 tab PO 1 hour later  6 tablet  0   No current facility-administered medications for this visit.    ROS:   General:  No weight loss, Fever, chills  HEENT: No recent headaches, no nasal bleeding, no visual changes, no sore throat  Neurologic: No dizziness, blackouts, seizures.  No recent episodes of slurred speech, or temporary blindness.  Cardiac: No recent episodes of chest pain/pressure, no shortness of breath at rest.  + shortness of breath with exertion.  Denies history of atrial fibrillation or irregular heartbeat  Pulmonary: No home oxygen, no productive cough, no hemoptysis,  No asthma or wheezing   Physical Examination  Filed Vitals:   02/28/14 0834 02/28/14 0835  BP: 171/89 187/82  Pulse: 108   Height: 5\' 2"  (1.575 m)   Weight: 161 lb (73.029 kg)   SpO2: 100%     Body mass index is 29.44 kg/(m^2).  General:  Alert and oriented, no acute distress HEENT: Normal Neck: Bilateral carotid bruits Pulmonary: Clear to auscultation bilaterally Cardiac: Regular Rate and Rhythm with diffuse 3/6 murmur heard throughout the precordium Extremity Pulses:  2+ radial, brachial, femoral pulses bilaterally Musculoskeletal: No deformity or edema  Neurologic: Upper and lower extremity motor 5/5 and symmetric  DATA:  Recent angiogram was reviewed. Right common carotid artery is patent. Greater than 90% left common carotid origin stenosis with poststenotic dilatation, patent left subclavian artery with patent left vertebral artery   ASSESSMENT:  Patient with high-grade left proximal common carotid artery stenosis which may be symptomatic with episodes intermittently of right arm symptoms   PLAN:  Retrograde left common carotid stent via a left neck incision possible left subclavian to carotid bypass if stenting not possible at the time of procedure. Risks benefits possible complications and procedure details were discussed with the patient  today including but not limited to bleeding infection stroke risk of 2-5%, possible vessel injury requiring sternotomy, possible myocardial events. She understands and agrees to proceed. We will schedule this for Tuesday, August 25 as a combined procedure with Dr. Scot Dock.  Ruta Hinds, MD [facility] [place] Office: [phone] Pager: [phone]

## 2014-03-05 NOTE — Op Note (Signed)
    NAME: SAVVY PEETERS   MRN: [id number] DOB: 1951/02/24    DATE OF OPERATION: 03/05/2014  PREOP DIAGNOSIS: symptomatic left common carotid artery stenosis  POSTOP DIAGNOSIS: same  PROCEDURE: exposure left common carotid artery  SURGEON: Judeth Cornfield. Scot Dock, MD,   COSURGEON: Ruta Hinds, MD (Carotid Stent)  ANESTHESIA: Gen.   EBL: minimal  INDICATIONS: NISHTHA RAIDER is a 63 y.o. female who presented with a symptomatic left common carotid artery stenosis. She had a tight focal stenosis of the proximal common carotid artery near its takeoff from the arch. She was evaluated by Dr. Oneida Alar of both be a good candidate for carotid stenting via a retrograde approach.  FINDINGS: common carotid artery was soft with no significant plaque  TECHNIQUE: The patient was taken to the operating room and received a general anesthetic. After careful positioning the left neck was prepped and draped in usual sterile fashion. A longitudinal incision was made along the anterior border of the sternocleidomastoid above the sternal notch. Dissection was carried down to the common carotid artery which was dissected free circumferentially over enough length to allow cannulation for carotid stenting. Area was controlled with a Rummel tourniquet distally.  At the completion of the procedure the deep layer was closed with a running 3-0 Vicryl the skin was closed the forceps technique stitch after hemostasis was obtained in the wound. Dermabond was applied.  The remainder of the dictation concerning a carotid stent is dictated separately by Dr. Oneida Alar.  Deitra Mayo, MD, FACS [facility]  DATE OF DICTATION:   03/05/2014

## 2014-03-05 NOTE — Interval H&P Note (Signed)
History and Physical Interval Note:  03/05/2014 7:10 AM  Sarah Alvarez  has presented today for surgery, with the diagnosis of Occlusion and stenosis of carotid artery without mention of cerebral infarction  The various methods of treatment have been discussed with the patient and family. After consideration of risks, benefits and other options for treatment, the patient has consented to  Procedure(s): INSERTION OF RETROGRADE COMMON CAROTID STENT (Left) BYPASS GRAFT CAROTID-SUBCLAVIAN; POSSIBLE STERNOTOMY (Left) STERNOTOMY (N/A) as a surgical intervention .  The patient's history has been reviewed, patient examined, no change in status, stable for surgery.  I have reviewed the patient's chart and labs.  Questions were answered to the patient's satisfaction.     FIELDS,CHARLES E

## 2014-03-06 ENCOUNTER — Encounter (HOSPITAL_COMMUNITY): Payer: Self-pay | Admitting: Vascular Surgery

## 2014-03-06 LAB — BASIC METABOLIC PANEL
ANION GAP: 8 (ref 5–15)
BUN: 7 mg/dL (ref 6–23)
CHLORIDE: 112 meq/L (ref 96–112)
CO2: 23 mEq/L (ref 19–32)
Calcium: 7.6 mg/dL — ABNORMAL LOW (ref 8.4–10.5)
Creatinine, Ser: 0.46 mg/dL — ABNORMAL LOW (ref 0.50–1.10)
GFR calc non Af Amer: >90 mL/min (ref >90)
Glucose, Bld: 42 mg/dL — CL (ref 70–99)
POTASSIUM: 3.6 meq/L — AB (ref 3.7–5.3)
Sodium: 143 mEq/L (ref 137–147)

## 2014-03-06 LAB — CBC
HCT: 28.5 % — ABNORMAL LOW (ref 36.0–46.0)
Hemoglobin: 9.5 g/dL — ABNORMAL LOW (ref 12.0–15.0)
MCH: 29.9 pg (ref 26.0–34.0)
MCHC: 33.3 g/dL (ref 30.0–36.0)
MCV: 89.6 fL (ref 78.0–100.0)
PLATELETS: 229 10*3/uL (ref 150–400)
RBC: 3.18 MIL/uL — ABNORMAL LOW (ref 3.87–5.11)
RDW: 13.8 % (ref 11.5–15.5)
WBC: 7.4 10*3/uL (ref 4.0–10.5)

## 2014-03-06 LAB — GLUCOSE, CAPILLARY
GLUCOSE-CAPILLARY: 85 mg/dL (ref 70–99)
GLUCOSE-CAPILLARY: 88 mg/dL (ref 70–99)
Glucose-Capillary: 98 mg/dL (ref 70–99)
Glucose-Capillary: 159 mg/dL — ABNORMAL HIGH (ref 70–99)

## 2014-03-06 MED ORDER — CIPROFLOXACIN HCL 500 MG PO TABS: 500.0000 mg | ORAL_TABLET | Freq: Two times a day (BID) | ORAL | Status: DC

## 2014-03-06 MED ORDER — OXYCODONE-ACETAMINOPHEN 5-325 MG PO TABS: 1.0000 | ORAL_TABLET | ORAL | Status: DC | PRN

## 2014-03-06 NOTE — Progress Notes (Signed)
Utilization review completed.  

## 2014-03-06 NOTE — Progress Notes (Signed)
Pt discharged down to her vehicle via wheelchair by the CNA. Pt VS stable at the time of discharge. Pt denied any pain at the time of discharge and denied complaints. Pt verbalized understanding d/c instructions, medications, and follow-up appts.

## 2014-03-06 NOTE — Discharge Summary (Signed)
[facility] Discharge Summary  Sarah Alvarez 03-10-1951 63 y.o. female  [id number]  Admission Date: 03/05/2014  Discharge Date: 03/06/14  Physician: Elam Dutch, MD  Admission Diagnosis: Occlusion and stenosis of carotid artery without mention of cerebral infarction   HPI:   This is a 63 y.o. female who presented for evaluation of intermittent weakness and tingling of the right hand. She was noted on recent arch aortogram to have a high-grade proximal left common carotid stenosis with poststenotic dilatation. She was referred for evaluation of possible common carotid stenting. She denies any recent stroke symptoms. She is on Plavix and aspirin. Other medical problems include coronary artery disease, hypertension, hyperlipidemia, diabetes, chronic mesenteric ischemia (prior spear mesenteric artery stent), prior right carotid endarterectomy by Dr. Scot Dock.   Hospital Course:  The patient was admitted to the hospital and taken to the operating room on 03/05/2014 and underwent retrograde left common carotid stent placement and primary repair of the left common carotid artery.  The patient tolerated the procedure well and was transported to the PACU in stable condition. Post-operatively her neuro exam was intact. In the PACU, she received two fluid boluses for systolic blood pressure under 100. She was then transferred to the step down unit. That evening, she complained of bilateral lower extremity numbness below the knees. She stated that her feet felt heavy. She had motor function in her feet.   By POD 1, the patient's neuro status was intact. Her blood pressure was stable. Her upper and lower extremity strength were symmetrical and 5/5. She had palpable dorsalis pedis pulses bilaterally without ischemic changes. The only abnormality was decreased sensation to the plantar aspect of her feet bilaterally. Her left neck incision was clean and intact without hematoma.  She  was able to ambulate in the halls, increase intake of solids and void without difficulty.   She was discharged home on POD 1 in good condition.    Recent Labs  03/04/14 1603 03/06/14 0253  NA 144 143  K 4.8 3.6*  CL 106 112  CO2 25 23  GLUCOSE 117* 42*  BUN 15 7  CALCIUM 9.4 7.6*    Recent Labs  03/04/14 1603 03/06/14 0253  WBC 7.6 7.4  HGB 12.7 9.5*  HCT 36.4 28.5*  PLT 296 229    Recent Labs  03/04/14 1603  INR 0.98    Discharge Instructions:   The patient is discharged to home with extensive instructions on wound care and progressive ambulation.  They are instructed not to drive or perform any heavy lifting until returning to see the physician in his office.  Discharge Instructions   ABDOMINAL PROCEDURE/ANEURYSM REPAIR/AORTO-BIFEMORAL BYPASS:  Call MD for increased abdominal pain; cramping diarrhea; nausea/vomiting    Complete by:  As directed      CAROTID [place]: Call MD for difficulty swallowing or speaking; weakness in arms or legs that is a new symtom; severe headache.  If you have increased swelling in the neck and/or  are having difficulty breathing, CALL [phone]    Complete by:  As directed      Call MD for:  redness, tenderness, or signs of infection (pain, swelling, bleeding, redness, odor or green/yellow discharge around incision site)    Complete by:  As directed      Call MD for:  severe or increased pain, loss or decreased feeling  in affected limb(s)    Complete by:  As directed      Call MD for:  temperature >100.5    Complete by:  As directed      Driving Restrictions    Complete by:  As directed   No driving for 2 weeks     Lifting restrictions    Complete by:  As directed   No lifting for 2 weeks     Resume previous diet    Complete by:  As directed      may wash over wound with mild soap and water    Complete by:  As directed            Discharge Diagnosis:  Occlusion and stenosis of carotid artery without mention of cerebral  infarction  Secondary Diagnosis: Patient Active Problem List   Diagnosis Date Noted  . Aftercare following surgery of the circulatory system, [place] 01/30/2014  . Excessive ear wax 10/23/2013  . Chronic low back pain 10/23/2013  . Occlusion and stenosis of carotid artery without mention of cerebral infarction 08/01/2013  . Carotid stenosis 07/19/2013  . Shock 07/19/2013  . Carotid bruit 06/21/2013  . Chronic mesenteric ischemia 06/21/2013  . Nausea and vomiting 06/20/2013  . Dehydration 06/19/2013  . Nausea & vomiting 06/19/2013  . Gastroparesis 06/19/2013  . DM (diabetes mellitus) 06/12/2013  . Essential hypertension, benign 06/12/2013  . Other and unspecified hyperlipidemia 06/12/2013  . History of UTI 06/12/2013   Past Medical History  Diagnosis Date  . Staph infection   . Restless leg syndrome   . Chronic mesenteric ischemia     s/p SMA stent 06/22/13  . Coronary artery disease   . Hypertension   . Hyperlipidemia   . Diabetes mellitus     Type  II      Medication List         acetaminophen 650 MG CR tablet  Commonly known as:  TYLENOL  Take 650 mg by mouth every 8 (eight) hours as needed for pain.     aspirin 325 MG tablet  Take 1 tablet (325 mg total) by mouth daily.     atorvastatin 20 MG tablet  Commonly known as:  LIPITOR  Take 1 tablet (20 mg total) by mouth daily.     benazepril 5 MG tablet  Commonly known as:  LOTENSIN  Take 2 tablets (10 mg total) by mouth every morning.     ciprofloxacin 500 MG tablet  Commonly known as:  CIPRO  Take 1 tablet (500 mg total) by mouth 2 (two) times daily.     clopidogrel 75 MG tablet  Commonly known as:  PLAVIX  Take 1 tablet (75 mg total) by mouth daily with breakfast.     cyclobenzaprine 5 MG tablet  Commonly known as:  FLEXERIL  Take 5 mg by mouth 3 (three) times daily as needed for muscle spasms.     famotidine 20 MG tablet  Commonly known as:  PEPCID  Take 20 mg by mouth 2 (two) times daily.      gabapentin 300 MG capsule  Commonly known as:  NEURONTIN  Take 300 mg by mouth at bedtime.     glipiZIDE 5 MG tablet  Commonly known as:  GLUCOTROL  Take 1 tablet (5 mg total) by mouth 2 (two) times daily before a meal.     insulin glargine 100 UNIT/ML injection  Commonly known as:  LANTUS  Inject 60 Units into the skin at bedtime.     loratadine 10 MG tablet  Commonly known as:  CLARITIN  Take 1 tablet (10 mg total) by  mouth daily as needed for allergies or rhinitis.     metFORMIN 500 MG (MOD) 24 hr tablet  Commonly known as:  GLUMETZA  Take 1 tablet (500 mg total) by mouth 2 (two) times daily with a meal.     oxyCODONE-acetaminophen 5-325 MG per tablet  Commonly known as:  PERCOCET/ROXICET  Take 1-2 tablets by mouth every 4 (four) hours as needed for moderate pain.     oxymetazoline 0.05 % nasal spray  Commonly known as:  AFRIN NASAL SPRAY  Place 1 spray into both nostrils 2 (two) times daily as needed for congestion.     traMADol 50 MG tablet  Commonly known as:  ULTRAM  Take 50 mg by mouth every 8 (eight) hours as needed for moderate pain.     Vitamin D (Ergocalciferol) 50000 UNITS Caps capsule  Commonly known as:  DRISDOL  Take 50,000 Units by mouth every 7 (seven) days. sunday        Roxicodone #20 No Refill Ciprofloxacin 500 mg bid #10 No Refill  Disposition: Home  Patient's condition: is good  Follow up: 1. Dr.  Oneida Alar in 2 weeks with carotid duplex.    Virgina Jock, PA-C [facility] [phone]  --- For [facility] use --- Instructions: Press F2 to tab through selections.  Delete question if not applicable.   Modified Rankin score at D/C (0-6): 0  IV medication needed for:  1. Hypertension: No 2. Hypotension: No  Post-op Complications: No  1. Post-op CVA or TIA: No  2. CN injury: No  3. Myocardial infarction: No  4.  CHF: No  5.  Dysrhythmia (new): No  6. Wound infection: No  7. Reperfusion symptoms: No  8.  Return to OR: No  Discharge medications: Statin use:  Yes If No: [ ]  For Medical reasons, [ ]  Non-compliant, [ ]  Not-indicated ASA use:  Yes  If No: [ ]  For Medical reasons, [ ]  Non-compliant, [ ]  Not-indicated Beta blocker use:  No If No: [ ]  For Medical reasons, [ ]  Non-compliant, [x ] Not-indicated ACE-Inhibitor use:  Yes If No: [ ]  For Medical reasons, [ ]  Non-compliant, [ ]  Not-indicated P2Y12 Antagonist use: Yes, [ ]  Plavix, [ ]  Plasugrel, [ ]  Ticlopinine, [ ]  Ticagrelor, [ ]  Other, [ ]  No for medical reason, [ ]  Non-compliant, [ ]  Not-indicated Anti-coagulant use:  No, [ ]  Warfarin, [ ]  Rivaroxaban, [ ]  Dabigatran, [ ]  Other, [ ]  No for medical reason, [ ]  Non-compliant, [x]  Not-indicated

## 2014-03-06 NOTE — Progress Notes (Addendum)
  [facility] Progress Note  03/06/2014 7:22 AM 1 Day Post-Op  Subjective:  Patient sitting in chair this am. Numbness yesterday night in bilateral legs somewhat improved. Says her legs continue to feel "heavy." Has not ambulated. Has been swallowing fine.   Tmax 99.1 BP sys 80s-130s 02 100% 2L  Filed Vitals:   03/06/14 0411  BP: 105/54  Pulse: 79  Temp:   Resp: 21     Physical Exam: Neuro:  CN II-XII intact. No smile asymmetry or tongue deviation. 5/5 grip strength bilaterally. 5/5 strength lower extremities bilaterally. Decreased sensation to plantar aspect of feet bilaterally. Sensation intact on dorsum.  Incision: Left neck incision clean dry and intact without hematoma.  Cardiac: regular rate and rhythm Extremities: No ischemic changes. No ulcers or wounds of feet.   CBC    Component Value Date/Time   WBC 7.4 03/06/2014 0253   RBC 3.18* 03/06/2014 0253   HGB 9.5* 03/06/2014 0253   HCT 28.5* 03/06/2014 0253   PLT 229 03/06/2014 0253   MCV 89.6 03/06/2014 0253   MCH 29.9 03/06/2014 0253   MCHC 33.3 03/06/2014 0253   RDW 13.8 03/06/2014 0253   LYMPHSABS 1.3 08/13/2013 0856   MONOABS 0.2 08/13/2013 0856   EOSABS 0.1 08/13/2013 0856   BASOSABS 0.0 08/13/2013 0856    BMET    Component Value Date/Time   NA 143 03/06/2014 0253   K 3.6* 03/06/2014 0253   CL 112 03/06/2014 0253   CO2 23 03/06/2014 0253   GLUCOSE 42* 03/06/2014 0253   BUN 7 03/06/2014 0253   CREATININE 0.46* 03/06/2014 0253   CREATININE 0.49* 08/13/2013 0856   CALCIUM 7.6* 03/06/2014 0253   GFRNONAA >90 03/06/2014 0253   GFRNONAA >89 08/13/2013 0856   GFRAA >90 03/06/2014 0253   GFRAA >89 08/13/2013 0856     Intake/Output Summary (Last 24 hours) at 03/06/14 0722 Last data filed at 03/06/14 0413  Gross per 24 hour  Intake   4460 ml  Output   3000 ml  Net   1460 ml     Assessment/Plan:  This is a 63 y.o. female who is s/p retrograde left common carotid stent, primary repair left common carotid artery.   1 Day Post-Op  -Patient is doing well this am. -Neuro exam is intact. She has a normal neuro exam except for decreased sensation on the dorsum of her feet.  -Please ambulate patient this am. -Has not voided yet. Foley just recently discontinued.  -Acute surgical blood loss anemia: Hgb today 9.5; 12.7 on 03/04/14; Asymptomatic.  -Glucose on am labs today was 42; rechecked with CBG and was 98.  -She is on aspirin, plavix and a statin. -Will discharge patient today after ambulation and voiding. Will discharge home with cipro for UTI. Follow-up with Dr. Oneida Alar in 2 weeks.   Virgina Jock, PA-C  [facility] Office: [phone] Pager: [phone] 03/06/2014 7:22 AM  Agree with above No hematoma 2+ DP pulses bilaterally Neuro symmetric upper and lower extremity 5/5 motor  Ruta Hinds, MD [facility] [place] Office: [phone] Pager: [phone]

## 2014-03-08 NOTE — Anesthesia Postprocedure Evaluation (Signed)
  Anesthesia Post-op Note  Patient: Sarah Alvarez  Procedure(s) Performed: Procedure(s): 1)Cutdown of open exposure left common carotid; 2) Retrograde left common carotid stent; 3) Repair of left common carotid artery. (Left)  Patient Location: PACU  Anesthesia Type:General  Level of Consciousness: awake and alert   Airway and Oxygen Therapy: Patient Spontanous Breathing  Post-op Pain: mild  Post-op Assessment: Post-op Vital signs reviewed, Patient's Cardiovascular Status Stable, Respiratory Function Stable, Patent Airway, No signs of Nausea or vomiting and Pain level controlled  Post-op Vital Signs: Reviewed and stable  Last Vitals:  Filed Vitals:   03/06/14 1216  BP: 184/73  Pulse: 84  Temp: 36.9 C  Resp: 21    Complications: No apparent anesthesia complications

## 2014-03-20 ENCOUNTER — Ambulatory Visit: Payer: Medicaid Other | Attending: Internal Medicine | Admitting: Internal Medicine

## 2014-03-20 ENCOUNTER — Encounter: Payer: Self-pay | Admitting: Internal Medicine

## 2014-03-20 ENCOUNTER — Encounter: Payer: Self-pay | Admitting: Vascular Surgery

## 2014-03-20 VITALS — BP 151/80 | HR 106 | Temp 98.2°F | Wt 167.8 lb

## 2014-03-20 DIAGNOSIS — I6529 Occlusion and stenosis of unspecified carotid artery: Secondary | ICD-10-CM

## 2014-03-20 DIAGNOSIS — G8929 Other chronic pain: Secondary | ICD-10-CM

## 2014-03-20 DIAGNOSIS — I10 Essential (primary) hypertension: Secondary | ICD-10-CM

## 2014-03-20 DIAGNOSIS — M545 Low back pain, unspecified: Secondary | ICD-10-CM

## 2014-03-20 DIAGNOSIS — I6522 Occlusion and stenosis of left carotid artery: Secondary | ICD-10-CM

## 2014-03-20 MED ORDER — DICLOFENAC SODIUM 1 % TD GEL: 4.0000 g | Freq: Four times a day (QID) | TRANSDERMAL | Status: DC

## 2014-03-20 NOTE — Progress Notes (Signed)
MRN: [id number] Name: SAIA DEROSSETT  Sex: female Age: 63 y.o. DOB: 29-Jul-1950  Allergies: Review of patient's allergies indicates no known allergies.  No chief complaint on file.   HPI: Patient is 63 y.o. female who has to of hypertension diabetes, rated stenosis, recently hospitalized and underwent left carotid stent placement currently patient on aspirin and Plavix following up with vascular surgery, she denies any acute symptoms reports improvement in her symptoms, she has history of chronic lower back pain, she is requesting refill on Voltaren gel which she was prescribed in the past which helped her with the symptoms. Today her blood pressure is borderline elevated denies any headache dizziness.  Past Medical History  Diagnosis Date  . Staph infection   . Restless leg syndrome   . Chronic mesenteric ischemia     s/p SMA stent 06/22/13  . Coronary artery disease   . Hypertension   . Hyperlipidemia   . Diabetes mellitus     Type  II    Past Surgical History  Procedure Laterality Date  . Cholecystectomy    . Abdominal hysterectomy    . Coronary angioplasty      ? LCX stent ~ 2000 in [place], [place] with LCX stent [facility] 09/1999 for reported re-instent stenosis  . Endarterectomy Right 07/19/2013    Procedure: ENDARTERECTOMY CAROTID-RIGHT;  Surgeon: Angelia Mould, MD;  Location: [facility];  Service: Vascular;  Laterality: Right;  . Carotid endarterectomy Right 07-19-13  . Insertion of retrograde carotid stent Left 03/05/2014    Procedure: 1)Cutdown of open exposure left common carotid; 2) Retrograde left common carotid stent; 3) Repair of left common carotid artery.;  Surgeon: Elam Dutch, MD;  Location: [place];  Service: Vascular;  Laterality: Left;      Medication List       This list is accurate as of: 03/20/14  4:48 PM.  Always use your most recent med list.               acetaminophen 650 MG CR tablet  Commonly known as:  TYLENOL  Take 650 mg by mouth every 8  (eight) hours as needed for pain.     aspirin 325 MG tablet  Take 1 tablet (325 mg total) by mouth daily.     atorvastatin 20 MG tablet  Commonly known as:  LIPITOR  Take 1 tablet (20 mg total) by mouth daily.     benazepril 5 MG tablet  Commonly known as:  LOTENSIN  Take 2 tablets (10 mg total) by mouth every morning.     clopidogrel 75 MG tablet  Commonly known as:  PLAVIX  Take 1 tablet (75 mg total) by mouth daily with breakfast.     cyclobenzaprine 5 MG tablet  Commonly known as:  FLEXERIL  Take 5 mg by mouth 3 (three) times daily as needed for muscle spasms.     diclofenac sodium 1 % Gel  Commonly known as:  VOLTAREN  Apply 4 g topically 4 (four) times daily.     famotidine 20 MG tablet  Commonly known as:  PEPCID  Take 20 mg by mouth 2 (two) times daily.     gabapentin 300 MG capsule  Commonly known as:  NEURONTIN  Take 300 mg by mouth at bedtime.     glipiZIDE 5 MG tablet  Commonly known as:  GLUCOTROL  Take 1 tablet (5 mg total) by mouth 2 (two) times daily before a meal.     insulin glargine  100 UNIT/ML injection  Commonly known as:  LANTUS  Inject 60 Units into the skin at bedtime.     loratadine 10 MG tablet  Commonly known as:  CLARITIN  Take 1 tablet (10 mg total) by mouth daily as needed for allergies or rhinitis.     metFORMIN 500 MG (MOD) 24 hr tablet  Commonly known as:  GLUMETZA  Take 1 tablet (500 mg total) by mouth 2 (two) times daily with a meal.     oxymetazoline 0.05 % nasal spray  Commonly known as:  AFRIN NASAL SPRAY  Place 1 spray into both nostrils 2 (two) times daily as needed for congestion.     traMADol 50 MG tablet  Commonly known as:  ULTRAM  Take 50 mg by mouth every 8 (eight) hours as needed for moderate pain.     Vitamin D (Ergocalciferol) 50000 UNITS Caps capsule  Commonly known as:  DRISDOL  Take 50,000 Units by mouth every 7 (seven) days. sunday        Meds ordered this encounter  Medications  . diclofenac sodium  (VOLTAREN) 1 % GEL    Sig: Apply 4 g topically 4 (four) times daily.    Dispense:  100 g    Refill:  2    Immunization History  Administered Date(s) Administered  . Influenza Split 06/05/2012  . Influenza,inj,Quad PF,36+ Mos 06/12/2013, 06/21/2013  . Pneumococcal Polysaccharide-23 06/21/2013    Family History  Problem Relation Age of Onset  . Diabetes Mother   . Stroke Mother   . Hyperlipidemia Mother   . Cancer Father   . Heart disease Father     PVD and  CAROTID  . Heart attack Father   . Diabetes Sister   . Hypertension Sister   . Heart disease Sister     Before age 59  . Hyperlipidemia Sister   . Varicose Veins Sister   . Cancer Brother   . Cancer Maternal Aunt     breast cancer   . Diabetes Maternal Grandmother   . Diabetes Paternal Grandmother     History  Substance Use Topics  . Smoking status: Former Smoker -- 1.00 packs/day for 20 years    Quit date: 06/21/2013  . Smokeless tobacco: Never Used  . Alcohol Use: No    Review of Systems   As noted in HPI  Filed Vitals:   03/20/14 1621  BP: 151/80  Pulse: 106  Temp: 98.2 F (36.8 C)    Physical Exam  Physical Exam  Constitutional: No distress.  Eyes: EOM are normal. Pupils are equal, round, and reactive to light.  Neck:  Surgical site in the left side clean no discharge  Cardiovascular: Normal rate.   Pulmonary/Chest: Breath sounds normal. No respiratory distress. She has no wheezes. She has no rales.  Musculoskeletal: She exhibits no edema.    CBC    Component Value Date/Time   WBC 7.4 03/06/2014 0253   RBC 3.18* 03/06/2014 0253   HGB 9.5* 03/06/2014 0253   HCT 28.5* 03/06/2014 0253   PLT 229 03/06/2014 0253   MCV 89.6 03/06/2014 0253   LYMPHSABS 1.3 08/13/2013 0856   MONOABS 0.2 08/13/2013 0856   EOSABS 0.1 08/13/2013 0856   BASOSABS 0.0 08/13/2013 0856    CMP     Component Value Date/Time   NA 143 03/06/2014 0253   K 3.6* 03/06/2014 0253   CL 112 03/06/2014 0253   CO2 23 03/06/2014 0253    GLUCOSE 42* 03/06/2014 0253  BUN 7 03/06/2014 0253   CREATININE 0.46* 03/06/2014 0253   CREATININE 0.49* 08/13/2013 0856   CALCIUM 7.6* 03/06/2014 0253   PROT 6.7 03/04/2014 1603   ALBUMIN 4.1 03/04/2014 1603   AST 13 03/04/2014 1603   ALT 7 03/04/2014 1603   ALKPHOS 96 03/04/2014 1603   BILITOT 0.2* 03/04/2014 1603   GFRNONAA >90 03/06/2014 0253   GFRNONAA >89 08/13/2013 0856   GFRAA >90 03/06/2014 0253   GFRAA >89 08/13/2013 0856    Lab Results  Component Value Date/Time   CHOL 124 08/13/2013  8:56 AM    No components found with this basename: [id number]    Lab Results  Component Value Date/Time   AST 13 03/04/2014  4:03 PM    Assessment and Plan  Essential hypertension, benign Blood pressure today is borderline elevated, advise patient for DASH diet continue with current meds. Her Chronic lower back pain - Plan: diclofenac sodium (VOLTAREN) 1 % GEL  Carotid stenosis, left Status post stent placement currently on aspirin and Plavix following up with vascular.  Return in about 3 months (around 06/19/2014) for diabetes, hypertension.  Lorayne Marek, MD

## 2014-03-20 NOTE — Progress Notes (Signed)
partient is here for a follow up with PCP, received

## 2014-03-20 NOTE — Patient Instructions (Signed)
DASH Eating Plan °DASH stands for "Dietary Approaches to Stop Hypertension." The DASH eating plan is a healthy eating plan that has been shown to reduce high blood pressure (hypertension). Additional health benefits may include reducing the risk of type 2 diabetes mellitus, heart disease, and stroke. The DASH eating plan may also help with weight loss. °WHAT DO I NEED TO KNOW ABOUT THE DASH EATING PLAN? °For the DASH eating plan, you will follow these general guidelines: °· Choose foods with a percent daily value for sodium of less than 5% (as listed on the food label). °· Use salt-free seasonings or herbs instead of table salt or sea salt. °· Check with your health care provider or pharmacist before using salt substitutes. °· Eat lower-sodium products, often labeled as "lower sodium" or "no salt added." °· Eat fresh foods. °· Eat more vegetables, fruits, and low-fat dairy products. °· Choose whole grains. Look for the word "whole" as the first word in the ingredient list. °· Choose fish and skinless chicken or turkey more often than red meat. Limit fish, poultry, and meat to 6 oz (170 g) each day. °· Limit sweets, desserts, sugars, and sugary drinks. °· Choose heart-healthy fats. °· Limit cheese to 1 oz (28 g) per day. °· Eat more home-cooked food and less restaurant, buffet, and fast food. °· Limit fried foods. °· Cook foods using methods other than frying. °· Limit canned vegetables. If you do use them, rinse them well to decrease the sodium. °· When eating at a restaurant, ask that your food be prepared with less salt, or no salt if possible. °WHAT FOODS CAN I EAT? °Seek help from a dietitian for individual calorie needs. °Grains °Whole grain or whole wheat bread. Brown rice. Whole grain or whole wheat pasta. Quinoa, bulgur, and whole grain cereals. Low-sodium cereals. Corn or whole wheat flour tortillas. Whole grain cornbread. Whole grain crackers. Low-sodium crackers. °Vegetables °Fresh or frozen vegetables  (raw, steamed, roasted, or grilled). Low-sodium or reduced-sodium tomato and vegetable juices. Low-sodium or reduced-sodium tomato sauce and paste. Low-sodium or reduced-sodium canned vegetables.  °Fruits °All fresh, canned (in natural juice), or frozen fruits. °Meat and Other Protein Products °Ground beef (85% or leaner), grass-fed beef, or beef trimmed of fat. Skinless chicken or turkey. Ground chicken or turkey. Pork trimmed of fat. All fish and seafood. Eggs. Dried beans, peas, or lentils. Unsalted nuts and seeds. Unsalted canned beans. °Dairy °Low-fat dairy products, such as skim or 1% milk, 2% or reduced-fat cheeses, low-fat ricotta or cottage cheese, or plain low-fat yogurt. Low-sodium or reduced-sodium cheeses. °Fats and Oils °Tub margarines without trans fats. Light or reduced-fat mayonnaise and salad dressings (reduced sodium). Avocado. Safflower, olive, or canola oils. Natural peanut or almond butter. °Other °Unsalted popcorn and pretzels. °The items listed above may not be a complete list of recommended foods or beverages. Contact your dietitian for more options. °WHAT FOODS ARE NOT RECOMMENDED? °Grains °White bread. White pasta. White rice. Refined cornbread. Bagels and croissants. Crackers that contain trans fat. °Vegetables °Creamed or fried vegetables. Vegetables in a cheese sauce. Regular canned vegetables. Regular canned tomato sauce and paste. Regular tomato and vegetable juices. °Fruits °Dried fruits. Canned fruit in light or heavy syrup. Fruit juice. °Meat and Other Protein Products °Fatty cuts of meat. Ribs, chicken wings, bacon, sausage, bologna, salami, chitterlings, fatback, hot dogs, bratwurst, and packaged luncheon meats. Salted nuts and seeds. Canned beans with salt. °Dairy °Whole or 2% milk, cream, half-and-half, and cream cheese. Whole-fat or sweetened yogurt. Full-fat   cheeses or blue cheese. Nondairy creamers and whipped toppings. Processed cheese, cheese spreads, or cheese  curds. °Condiments °Onion and garlic salt, seasoned salt, table salt, and sea salt. Canned and packaged gravies. Worcestershire sauce. Tartar sauce. Barbecue sauce. Teriyaki sauce. Soy sauce, including reduced sodium. Steak sauce. Fish sauce. Oyster sauce. Cocktail sauce. Horseradish. Ketchup and mustard. Meat flavorings and tenderizers. Bouillon cubes. Hot sauce. Tabasco sauce. Marinades. Taco seasonings. Relishes. °Fats and Oils °Butter, stick margarine, lard, shortening, ghee, and bacon fat. Coconut, palm kernel, or palm oils. Regular salad dressings. °Other °Pickles and olives. Salted popcorn and pretzels. °The items listed above may not be a complete list of foods and beverages to avoid. Contact your dietitian for more information. °WHERE CAN I FIND MORE INFORMATION? °National Heart, [employer], and [employer] Institute: www.nhlbi.nih.gov/health/health-topics/topics/dash/ °Document Released: 06/17/2011 Document Revised: 11/12/2013 Document Reviewed: 05/02/2013 °Exit[employer]® Patient Information ©2015 [employer]. This information is not intended to replace advice given to you by your health care provider. Make sure you discuss any questions you have with your health care provider. ° °

## 2014-03-21 ENCOUNTER — Ambulatory Visit (INDEPENDENT_AMBULATORY_CARE_PROVIDER_SITE_OTHER): Payer: Self-pay | Admitting: Vascular Surgery

## 2014-03-21 ENCOUNTER — Encounter: Payer: Self-pay | Admitting: Vascular Surgery

## 2014-03-21 ENCOUNTER — Other Ambulatory Visit (HOSPITAL_COMMUNITY): Payer: Medicaid Other

## 2014-03-21 VITALS — BP 120/59 | HR 90 | Temp 98.3°F | Ht 63.0 in | Wt 166.0 lb

## 2014-03-21 DIAGNOSIS — Z48812 Encounter for surgical aftercare following surgery on the circulatory system: Secondary | ICD-10-CM

## 2014-03-21 DIAGNOSIS — I6529 Occlusion and stenosis of unspecified carotid artery: Secondary | ICD-10-CM

## 2014-03-21 NOTE — Progress Notes (Signed)
Patient is a 63 year old female returns for followup today. She underwent left retrograde common carotid stenting on 03/05/2014. She tolerated the procedure well. She denies any symptoms of TIA amaurosis or stroke. She did end up with some bruising in her left anterior chest but otherwise is doing well. She is not smoking. However, she states several family members in the house to smoke. She is on Plavix and aspirin.  Physical exam: Filed Vitals:   03/21/14 1235 03/21/14 1238  BP: 106/63 120/59  Pulse: 84 90  Temp: 98.3 F (36.8 C)   TempSrc: Oral   Height: 5\' 3"  (1.6 m)   Weight: 166 lb (75.297 kg)   SpO2: 100%     Neck: Healing incision base left neck bilateral carotid bruits   Cardiac: Regular rate and rhythm 3/6 systolic cardiac murmur  Assessment: Doing well status post left common carotid stenting  Plan: Continue Plavix and aspirin indefinitely. Followup carotid duplex scan Dr. Scot Dock 6 months  Ruta Hinds, MD [facility] [place] Office: [phone] Pager: [phone]

## 2014-03-21 NOTE — Addendum Note (Signed)
Addended by: Mena Goes on: 03/21/2014 05:21 PM   Modules accepted: Orders

## 2014-04-09 ENCOUNTER — Other Ambulatory Visit: Payer: Self-pay | Admitting: Internal Medicine

## 2014-04-09 NOTE — Telephone Encounter (Signed)
Pt. Came into clinic to request a medication refill on traMADol (ULTRAM) 50 MG tablet. Please f/u with pt.

## 2014-04-10 ENCOUNTER — Other Ambulatory Visit: Payer: Self-pay

## 2014-04-10 MED ORDER — TRAMADOL HCL 50 MG PO TABS: 50.0000 mg | ORAL_TABLET | Freq: Three times a day (TID) | ORAL | Status: DC | PRN

## 2014-04-10 NOTE — Progress Notes (Unsigned)
Patient here in lobby requesting refill on her tramadol Per Dr Annitta Needs- we will give a prescription for her tramadol

## 2014-04-22 MED ORDER — FAMOTIDINE 20 MG PO TABS: 20.0000 mg | ORAL_TABLET | Freq: Two times a day (BID) | ORAL | Status: DC

## 2014-04-23 ENCOUNTER — Other Ambulatory Visit: Payer: Self-pay | Admitting: Emergency Medicine

## 2014-04-24 ENCOUNTER — Emergency Department (HOSPITAL_COMMUNITY)
Admission: EM | Admit: 2014-04-24 | Discharge: 2014-04-24 | Disposition: A | Discharge status: Home / Self Care | Payer: Medicaid Other | Attending: Emergency Medicine | Admitting: Emergency Medicine

## 2014-04-24 ENCOUNTER — Emergency Department (HOSPITAL_COMMUNITY): Payer: Medicaid Other

## 2014-04-24 ENCOUNTER — Encounter (HOSPITAL_COMMUNITY): Payer: Self-pay | Admitting: Emergency Medicine

## 2014-04-24 DIAGNOSIS — Z8619 Personal history of other infectious and parasitic diseases: Secondary | ICD-10-CM | POA: Insufficient documentation

## 2014-04-24 DIAGNOSIS — S6992XA Unspecified injury of left wrist, hand and finger(s), initial encounter: Secondary | ICD-10-CM | POA: Diagnosis present

## 2014-04-24 DIAGNOSIS — Z791 Long term (current) use of non-steroidal anti-inflammatories (NSAID): Secondary | ICD-10-CM | POA: Insufficient documentation

## 2014-04-24 DIAGNOSIS — E119 Type 2 diabetes mellitus without complications: Secondary | ICD-10-CM | POA: Diagnosis not present

## 2014-04-24 DIAGNOSIS — Z87891 Personal history of nicotine dependence: Secondary | ICD-10-CM | POA: Insufficient documentation

## 2014-04-24 DIAGNOSIS — W19XXXA Unspecified fall, initial encounter: Secondary | ICD-10-CM | POA: Diagnosis not present

## 2014-04-24 DIAGNOSIS — E785 Hyperlipidemia, unspecified: Secondary | ICD-10-CM | POA: Diagnosis not present

## 2014-04-24 DIAGNOSIS — I2510 Atherosclerotic heart disease of native coronary artery without angina pectoris: Secondary | ICD-10-CM | POA: Diagnosis not present

## 2014-04-24 DIAGNOSIS — Z7982 Long term (current) use of aspirin: Secondary | ICD-10-CM | POA: Diagnosis not present

## 2014-04-24 DIAGNOSIS — S62637A Displaced fracture of distal phalanx of left little finger, initial encounter for closed fracture: Secondary | ICD-10-CM | POA: Diagnosis not present

## 2014-04-24 DIAGNOSIS — I10 Essential (primary) hypertension: Secondary | ICD-10-CM | POA: Insufficient documentation

## 2014-04-24 DIAGNOSIS — Z792 Long term (current) use of antibiotics: Secondary | ICD-10-CM | POA: Insufficient documentation

## 2014-04-24 DIAGNOSIS — Y939 Activity, unspecified: Secondary | ICD-10-CM | POA: Insufficient documentation

## 2014-04-24 DIAGNOSIS — Z79899 Other long term (current) drug therapy: Secondary | ICD-10-CM | POA: Diagnosis not present

## 2014-04-24 DIAGNOSIS — S62609A Fracture of unspecified phalanx of unspecified finger, initial encounter for closed fracture: Secondary | ICD-10-CM

## 2014-04-24 DIAGNOSIS — Y929 Unspecified place or not applicable: Secondary | ICD-10-CM | POA: Diagnosis not present

## 2014-04-24 DIAGNOSIS — Z794 Long term (current) use of insulin: Secondary | ICD-10-CM | POA: Diagnosis not present

## 2014-04-24 MED ORDER — HYDROCODONE-ACETAMINOPHEN 5-325 MG PO TABS: 1.0000 | ORAL_TABLET | ORAL | Status: DC | PRN

## 2014-04-24 MED ORDER — HYDROCODONE-ACETAMINOPHEN 5-325 MG PO TABS
1.0000 | ORAL_TABLET | Freq: Once | ORAL | Status: AC
  Administered 2014-04-24: 1 via ORAL
  Filled 2014-04-24: qty 1

## 2014-04-24 NOTE — Discharge Instructions (Signed)
Cryotherapy °Cryotherapy means treatment with cold. Ice or gel packs can be used to reduce both pain and swelling. Ice is the most helpful within the first 24 to 48 hours after an injury or flare-up from overusing a muscle or joint. Sprains, strains, spasms, burning pain, shooting pain, and aches can all be eased with ice. Ice can also be used when recovering from surgery. Ice is effective, has very few side effects, and is safe for most people to use. °PRECAUTIONS  °Ice is not a safe treatment option for people with: °· Raynaud phenomenon. This is a condition affecting small blood vessels in the extremities. Exposure to cold may cause your problems to return. °· Cold hypersensitivity. There are many forms of cold hypersensitivity, including: °¨ Cold urticaria. Red, itchy hives appear on the skin when the tissues begin to warm after being iced. °¨ Cold erythema. This is a red, itchy rash caused by exposure to cold. °¨ Cold hemoglobinuria. Red blood cells break down when the tissues begin to warm after being iced. The hemoglobin that carry oxygen are passed into the urine because they cannot combine with blood proteins fast enough. °· Numbness or altered sensitivity in the area being iced. °If you have any of the following conditions, do not use ice until you have discussed cryotherapy with your caregiver: °· Heart conditions, such as arrhythmia, angina, or chronic heart disease. °· High blood pressure. °· Healing wounds or open skin in the area being iced. °· Current infections. °· Rheumatoid arthritis. °· Poor circulation. °· Diabetes. °Ice slows the blood flow in the region it is applied. This is beneficial when trying to stop inflamed tissues from spreading irritating chemicals to surrounding tissues. However, if you expose your skin to cold temperatures for too long or without the proper protection, you can damage your skin or nerves. Watch for signs of skin damage due to cold. °HOME CARE INSTRUCTIONS °Follow  these tips to use ice and cold packs safely. °· Place a dry or damp towel between the ice and skin. A damp towel will cool the skin more quickly, so you may need to shorten the time that the ice is used. °· For a more rapid response, add gentle compression to the ice. °· Ice for no more than 10 to 20 minutes at a time. The bonier the area you are icing, the less time it will take to get the benefits of ice. °· Check your skin after 5 minutes to make sure there are no signs of a poor response to cold or skin damage. °· Rest 20 minutes or more between uses. °· Once your skin is numb, you can end your treatment. You can test numbness by very lightly touching your skin. The touch should be so light that you do not see the skin dimple from the pressure of your fingertip. When using ice, most people will feel these normal sensations in this order: cold, burning, aching, and numbness. °· Do not use ice on someone who cannot communicate their responses to pain, such as small children or people with dementia. °HOW TO MAKE AN ICE PACK °Ice packs are the most common way to use ice therapy. Other methods include ice massage, ice baths, and cryosprays. Muscle creams that cause a cold, tingly feeling do not offer the same benefits that ice offers and should not be used as a substitute unless recommended by your caregiver. °To make an ice pack, do one of the following: °· Place crushed ice or a   bag of frozen vegetables in a sealable plastic bag. Squeeze out the excess air. Place this bag inside another plastic bag. Slide the bag into a pillowcase or place a damp towel between your skin and the bag.  Mix 3 parts water with 1 part rubbing alcohol. Freeze the mixture in a sealable plastic bag. When you remove the mixture from the freezer, it will be slushy. Squeeze out the excess air. Place this bag inside another plastic bag. Slide the bag into a pillowcase or place a damp towel between your skin and the bag. SEEK MEDICAL CARE  IF:  You develop white spots on your skin. This may give the skin a blotchy (mottled) appearance.  Your skin turns blue or pale.  Your skin becomes waxy or hard.  Your swelling gets worse. MAKE SURE YOU:   Understand these instructions.  Will watch your condition.  Will get help right away if you are not doing well or get worse. Document Released: 02/22/2011 Document Revised: 11/12/2013 Document Reviewed: 02/22/2011 [facility] Patient Information 2015 [place], [place]. This information is not intended to replace advice given to you by your health care provider. Make sure you discuss any questions you have with your health care provider. Finger Fracture Fractures of fingers are breaks in the bones of the fingers. There are many types of fractures. There are different ways of treating these fractures. Your health care provider will discuss the best way to treat your fracture. CAUSES Traumatic injury is the main cause of broken fingers. These include:  Injuries while playing sports.  Workplace injuries.  Falls. RISK FACTORS Activities that can increase your risk of finger fractures include:  Sports.  Workplace activities that involve machinery.  A condition called osteoporosis, which can make your bones less dense and cause them to fracture more easily. SIGNS AND SYMPTOMS The main symptoms of a broken finger are pain and swelling within 15 minutes after the injury. Other symptoms include:  Bruising of your finger.  Stiffness of your finger.  Numbness of your finger.  Exposed bones (compound fracture) if the fracture is severe. DIAGNOSIS  The best way to diagnose a broken bone is with X-ray imaging. Additionally, your health care provider will use this X-ray image to evaluate the position of the broken finger bones.  TREATMENT  Finger fractures can be treated with:   Nonreduction--This means the bones are in place. The finger is splinted without changing the positions of  the bone pieces. The splint is usually left on for about a week to 10 days. This will depend on your fracture and what your health care provider thinks.  Closed reduction--The bones are put back into position without using surgery. The finger is then splinted.  Open reduction and internal fixation--The fracture site is opened. Then the bone pieces are fixed into place with pins or some type of hardware. This is seldom required. It depends on the severity of the fracture. HOME CARE INSTRUCTIONS   Follow your health care provider's instructions regarding activities, exercises, and physical therapy.  Only take over-the-counter or prescription medicines for pain, discomfort, or fever as directed by your health care provider. SEEK MEDICAL CARE IF: You have pain or swelling that limits the motion or use of your fingers. SEEK IMMEDIATE MEDICAL CARE IF:  Your finger becomes numb. MAKE SURE YOU:   Understand these instructions.  Will watch your condition.  Will get help right away if you are not doing well or get worse. Document Released: 10/10/2000 Document Revised:  04/18/2013 Document Reviewed: 02/07/2013 [employer] Patient Information 2015 [place], [place]. This information is not intended to replace advice given to you by your health care provider. Make sure you discuss any questions you have with your health care provider.

## 2014-04-24 NOTE — ED Notes (Signed)
Declined W/C at D/C and was escorted to lobby by RN. 

## 2014-04-24 NOTE — Progress Notes (Signed)
Orthopedic Tech Progress Note Patient Details:  Sarah Alvarez 03-14-1951 [id number]  Ortho Devices Type of Ortho Device: Finger splint Ortho Device/Splint Location: lue Ortho Device/Splint Interventions: Application   Quartez Lagos 04/24/2014, 8:30 PM

## 2014-04-24 NOTE — ED Provider Notes (Signed)
CSN: [id number]     Arrival date & time 04/24/14  5625 History  This chart was scribed for Sarah Lange, PA-C working with Orlie Dakin, MD by Randa Evens, ED Scribe. This patient was seen in room TR05C/TR05C and the patient's care was started at 7:00 PM.     Chief Complaint  Patient presents with  . Fall  . Hand Injury   The history is provided by the patient. No language interpreter was used.   HPI Comments: Sarah Alvarez is a 63 y.o. female who presents to the Emergency Department complaining of fall onset prior to arrival. She states she fell forward and fell onto her out stretch hand. She is states she is having some associated left hand pain and left hand swelling.       Past Medical History  Diagnosis Date  . Staph infection   . Restless leg syndrome   . Chronic mesenteric ischemia     s/p SMA stent 06/22/13  . Coronary artery disease   . Hypertension   . Hyperlipidemia   . Diabetes mellitus     Type  II   Past Surgical History  Procedure Laterality Date  . Cholecystectomy    . Abdominal hysterectomy    . Coronary angioplasty      ? LCX stent ~ 2000 in [place], [place] with LCX stent [facility] 09/1999 for reported re-instent stenosis  . Endarterectomy Right 07/19/2013    Procedure: ENDARTERECTOMY CAROTID-RIGHT;  Surgeon: Angelia Mould, MD;  Location: [facility];  Service: Vascular;  Laterality: Right;  . Carotid endarterectomy Right 07-19-13  . Insertion of retrograde carotid stent Left 03/05/2014    Procedure: 1)Cutdown of open exposure left common carotid; 2) Retrograde left common carotid stent; 3) Repair of left common carotid artery.;  Surgeon: Elam Dutch, MD;  Location: [facility] OR;  Service: Vascular;  Laterality: Left;   Family History  Problem Relation Age of Onset  . Diabetes Mother   . Stroke Mother   . Hyperlipidemia Mother   . Cancer Father   . Heart disease Father     PVD and  CAROTID  . Heart attack Father   . Diabetes Sister   . Hypertension  Sister   . Heart disease Sister     Before age 98  . Hyperlipidemia Sister   . Varicose Veins Sister   . Cancer Brother   . Cancer Maternal Aunt     breast cancer   . Diabetes Maternal Grandmother   . Diabetes Paternal Grandmother    History  Substance Use Topics  . Smoking status: Former Smoker -- 1.00 packs/day for 20 years    Quit date: 06/21/2013  . Smokeless tobacco: Never Used  . Alcohol Use: No   OB History   Grav Para Term Preterm Abortions TAB SAB Ect Mult Living                 Review of Systems  Musculoskeletal: Positive for arthralgias and joint swelling.    Allergies  Review of patient's allergies indicates no known allergies.  Home Medications   Prior to Admission medications   Medication Sig Start Date End Date Taking? Authorizing Provider  acetaminophen (TYLENOL) 650 MG CR tablet Take 650 mg by mouth every 8 (eight) hours as needed for pain.   Yes Historical Provider, MD  aspirin 325 MG tablet Take 1 tablet (325 mg total) by mouth daily. 06/25/13  Yes Donne Hazel, MD  atorvastatin (LIPITOR) 20 MG tablet Take 1  tablet (20 mg total) by mouth daily. 01/22/14  Yes Deepak Advani, MD  benazepril (LOTENSIN) 5 MG tablet Take 2 tablets (10 mg total) by mouth every morning. 01/22/14  Yes Lorayne Marek, MD  clopidogrel (PLAVIX) 75 MG tablet Take 1 tablet (75 mg total) by mouth daily with breakfast. 01/22/14  Yes Deepak Advani, MD  cyclobenzaprine (FLEXERIL) 5 MG tablet Take 5 mg by mouth 3 (three) times daily as needed for muscle spasms.   Yes Historical Provider, MD  diclofenac sodium (VOLTAREN) 1 % GEL Apply 4 g topically 4 (four) times daily. 03/20/14  Yes Lorayne Marek, MD  famotidine (PEPCID) 20 MG tablet Take 1 tablet (20 mg total) by mouth 2 (two) times daily. 04/22/14  Yes Lorayne Marek, MD  gabapentin (NEURONTIN) 300 MG capsule Take 300 mg by mouth at bedtime.   Yes Historical Provider, MD  glipiZIDE (GLUCOTROL) 5 MG tablet Take 1 tablet (5 mg total) by mouth 2  (two) times daily before a meal. 01/22/14  Yes Deepak Advani, MD  insulin glargine (LANTUS) 100 UNIT/ML injection Inject 60 Units into the skin at bedtime.   Yes Historical Provider, MD  loratadine (CLARITIN) 10 MG tablet Take 1 tablet (10 mg total) by mouth daily as needed for allergies or rhinitis. 01/22/14  Yes Lorayne Marek, MD  metFORMIN (GLUMETZA) 500 MG (MOD) 24 hr tablet Take 1 tablet (500 mg total) by mouth 2 (two) times daily with a meal. 01/22/14  Yes Deepak Advani, MD  oxymetazoline (AFRIN NASAL SPRAY) 0.05 % nasal spray Place 1 spray into both nostrils 2 (two) times daily as needed for congestion. 01/22/14  Yes Lorayne Marek, MD  traMADol (ULTRAM) 50 MG tablet Take 1 tablet (50 mg total) by mouth every 8 (eight) hours as needed for moderate pain. 04/10/14  Yes Lorayne Marek, MD  Vitamin D, Ergocalciferol, (DRISDOL) 50000 UNITS CAPS capsule Take 50,000 Units by mouth every 7 (seven) days. sunday   Yes Historical Provider, MD  HYDROcodone-acetaminophen (NORCO/VICODIN) 5-325 MG per tablet Take 1-2 tablets by mouth every 4 (four) hours as needed. 04/24/14   Aanyah Loa A Haward Pope, PA-C   Triage Vitals: BP 133/90  Pulse 117  Temp(Src) 99 F (37.2 C) (Oral)  Resp 18  SpO2 97%  Physical Exam  Nursing note and vitals reviewed. Constitutional: She is oriented to person, place, and time. She appears well-developed and well-nourished. No distress.  HENT:  Head: Normocephalic and atraumatic.  Eyes: Conjunctivae and EOM are normal.  Neck: Neck supple. No tracheal deviation present.  Cardiovascular: Normal rate.   Pulmonary/Chest: Effort normal. No respiratory distress.  Musculoskeletal: Normal range of motion.  Left hand swollen over 5th MCP joint with bruising on palmar surface,  Neurological: She is alert and oriented to person, place, and time.  Skin: Skin is warm and dry.  Psychiatric: She has a normal mood and affect. Her behavior is normal.    ED Course  Procedures (including critical care  time) DIAGNOSTIC STUDIES: Oxygen Saturation is 97% on RA, normal by my interpretation.    COORDINATION OF CARE: 7:11 PM-Discussed treatment plan which includes left hand x-ray with pt at bedside and pt agreed to plan.     Labs Review Labs Reviewed - No data to display  Imaging Review Dg Hand Complete Left  04/24/2014   CLINICAL DATA:  Left hand pain. Swelling. One day post fall. Fell on floor.  EXAM: LEFT HAND - COMPLETE 3+ VIEW  COMPARISON:  None.  FINDINGS: Fracture of the base of the  proximal phalanx of the left fifth digit noted. Fracture slightly displaced. No other associated abnormality identified.  IMPRESSION: Slightly displaced fracture of the base of the proximal phalanx of the left fifth digit.   Electronically Signed   By: Marcello Moores  Register   On: 04/24/2014 20:03     EKG Interpretation None      MDM   Final diagnoses:  Finger fracture, right, closed, initial encounter    1. Right 5th phalanx fracture  Finger splinted and hand referral provided. Injury from fall isolated to hand without other complaint. Ice, elevation, pain management.    I personally performed the services described in this documentation, which was scribed in my presence. The recorded information has been reviewed and is accurate.       Dewaine Oats, PA-C 04/24/14 2050

## 2014-04-24 NOTE — ED Notes (Signed)
Pt reports tripping and falling, landed on left hand and now having pain to hand.

## 2014-04-24 NOTE — ED Provider Notes (Signed)
Medical screening examination/treatment/procedure(s) were performed by non-physician practitioner and as supervising physician I was immediately available for consultation/collaboration.   EKG Interpretation None       Orlie Dakin, MD 04/24/14 2356

## 2014-04-30 ENCOUNTER — Telehealth: Payer: Self-pay | Admitting: Internal Medicine

## 2014-04-30 ENCOUNTER — Telehealth: Payer: Self-pay

## 2014-04-30 NOTE — Telephone Encounter (Signed)
Pt wanted to report to PCP that she broke finger on left hand and has procedure tomorrow involving insertion of pins. Pt will be having procedure with Dr Caralyn Guile and would like to speak to nurse/Dr.

## 2014-04-30 NOTE — Telephone Encounter (Signed)
Patient had called to let dr know she broke her finger last week Patient is scheduled tomorrow 05/02/2015 for surgery to repair the broken finger Patient stated the dr will be putting pins in her finger

## 2014-05-02 ENCOUNTER — Ambulatory Visit (INDEPENDENT_AMBULATORY_CARE_PROVIDER_SITE_OTHER): Payer: Self-pay | Admitting: Family

## 2014-05-02 ENCOUNTER — Telehealth: Payer: Self-pay

## 2014-05-02 ENCOUNTER — Encounter: Payer: Self-pay | Admitting: Family

## 2014-05-02 VITALS — BP 114/71 | HR 93 | Temp 98.0°F | Resp 16 | Ht 63.0 in | Wt 165.0 lb

## 2014-05-02 DIAGNOSIS — K551 Chronic vascular disorders of intestine: Secondary | ICD-10-CM

## 2014-05-02 DIAGNOSIS — Z5189 Encounter for other specified aftercare: Secondary | ICD-10-CM

## 2014-05-02 NOTE — Progress Notes (Signed)
Established Carotid Patient   History of Present Illness  Sarah Alvarez is a 63 y.o. female patient of Dr. Scot Dock who was found to have a carotid bruit during a hospitalization. Carotid duplex scan showed a greater than 80% right carotid stenosis. She underwent a right carotid endarterectomy with Dacron patch angioplasty on 07/19/2013.  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 This was done by interventional radiology.  On 03/05/14 Dr. Oneida Alar and Dr. Scot Dock inserted a stent in her left CCA.  She returns today with c/o suture remaining. She started noticing tingling, pain, and weakness in right hand and tingling in right lower leg for over a year, worse in her right leg at night, otherwise worse in her right hand in general, not worsening or improving.  She is right hand dominant.  She denies loss of vision in one eye vs the other, denies dizziness, denies post prandial abdominal pain since SMA stenting done, denies aphasia or confusion.  Pt states her PCP wants her to have her first colonoscopy but needs to be off Plavix that she takes for PAD, prescribed while hospitalized for right CEA.  She denies ever having a stroke or TIA.  She denies any history of MI or kidney problems.  The patient's previous neurologic deficits are Unchanged.  She denies claudication symptoms with walking, denies non healing wounds in legs or feet.  Pt denies other New Medical or Surgical History.  Pt Diabetic: Yes, states her last A1C was 9.?, uncontrolled  Pt smoker: former smoker, quit during January, 2015 hospital admission for right CEA, but is yet exposed to 2 smokers in her house.  Pt meds include:  Statin : Yes  ASA: Yes, 81 mg daily  Other anticoagulants/antiplatelets: Plavix      Past Medical History  Diagnosis Date  . Staph infection   . Restless leg syndrome   . Chronic mesenteric ischemia    s/p SMA stent 06/22/13  . Coronary artery disease   . Hypertension   . Hyperlipidemia   . Diabetes mellitus     Type  II    Social History History  Substance Use Topics  . Smoking status: Former Smoker -- 1.00 packs/day for 20 years    Quit date: 06/21/2013  . Smokeless tobacco: Never Used  . Alcohol Use: No    Family History Family History  Problem Relation Age of Onset  . Diabetes Mother   . Stroke Mother   . Hyperlipidemia Mother   . Cancer Father   . Heart disease Father     PVD and  CAROTID  . Heart attack Father   . Diabetes Sister   . Hypertension Sister   . Heart disease Sister     Before age 67  . Hyperlipidemia Sister   . Varicose Veins Sister   . Cancer Brother   . Cancer Maternal Aunt     breast cancer   . Diabetes Maternal Grandmother   . Diabetes Paternal Grandmother     Surgical History Past Surgical History  Procedure Laterality Date  . Cholecystectomy    . Abdominal hysterectomy    . Coronary angioplasty      ? LCX stent ~ 2000 in [place], [place] with LCX stent [facility] 09/1999 for reported re-instent stenosis  . Endarterectomy Right 07/19/2013    Procedure: ENDARTERECTOMY CAROTID-RIGHT;  Surgeon: Angelia Mould, MD;  Location: [place];  Service: Vascular;  Laterality:  Right;  . Carotid endarterectomy Right 07-19-13  . Insertion of retrograde carotid stent Left 03/05/2014    Procedure: 1)Cutdown of open exposure left common carotid; 2) Retrograde left common carotid stent; 3) Repair of left common carotid artery.;  Surgeon: Elam Dutch, MD;  Location: [place];  Service: Vascular;  Laterality: Left;    No Known Allergies  Current Outpatient Prescriptions  Medication Sig Dispense Refill  . acetaminophen (TYLENOL) 650 MG CR tablet Take 650 mg by mouth every 8 (eight) hours as needed for pain.      Marland Kitchen aspirin 325 MG tablet Take 1 tablet (325 mg total) by mouth daily.      Marland Kitchen atorvastatin (LIPITOR) 20 MG tablet Take 1 tablet (20 mg total) by mouth  daily.  30 tablet  5  . benazepril (LOTENSIN) 5 MG tablet Take 2 tablets (10 mg total) by mouth every morning.  60 tablet  2  . clopidogrel (PLAVIX) 75 MG tablet Take 1 tablet (75 mg total) by mouth daily with breakfast.  30 tablet  3  . cyclobenzaprine (FLEXERIL) 5 MG tablet Take 5 mg by mouth 3 (three) times daily as needed for muscle spasms.      . diclofenac sodium (VOLTAREN) 1 % GEL Apply 4 g topically 4 (four) times daily.  100 g  2  . famotidine (PEPCID) 20 MG tablet Take 1 tablet (20 mg total) by mouth 2 (two) times daily.  60 tablet  2  . gabapentin (NEURONTIN) 300 MG capsule Take 300 mg by mouth at bedtime.      Marland Kitchen glipiZIDE (GLUCOTROL) 5 MG tablet Take 1 tablet (5 mg total) by mouth 2 (two) times daily before a meal.  60 tablet  2  . HYDROcodone-acetaminophen (NORCO/VICODIN) 5-325 MG per tablet Take 1-2 tablets by mouth every 4 (four) hours as needed.  12 tablet  0  . insulin glargine (LANTUS) 100 UNIT/ML injection Inject 60 Units into the skin at bedtime.      Marland Kitchen loratadine (CLARITIN) 10 MG tablet Take 1 tablet (10 mg total) by mouth daily as needed for allergies or rhinitis.  30 tablet  11  . metFORMIN (GLUMETZA) 500 MG (MOD) 24 hr tablet Take 1 tablet (500 mg total) by mouth 2 (two) times daily with a meal.  60 tablet  2  . oxymetazoline (AFRIN NASAL SPRAY) 0.05 % nasal spray Place 1 spray into both nostrils 2 (two) times daily as needed for congestion.  30 mL  0  . traMADol (ULTRAM) 50 MG tablet Take 1 tablet (50 mg total) by mouth every 8 (eight) hours as needed for moderate pain.  30 tablet  0  . Vitamin D, Ergocalciferol, (DRISDOL) 50000 UNITS CAPS capsule Take 50,000 Units by mouth every 7 (seven) days. sunday      . [DISCONTINUED] colchicine 0.6 MG tablet Take two tab PO, then take 1 tab PO 1 hour later  6 tablet  0   No current facility-administered medications for this visit.    Review of Systems : See HPI for pertinent positives and negatives.  Physical Examination  Filed  Vitals:   05/02/14 1255  BP: 114/71  Pulse: 93  Temp: 98 F (36.7 C)  TempSrc: Oral  Resp: 16  Height: 5\' 3"  (1.6 m)  Weight: 165 lb (74.844 kg)  SpO2: 97%   Body mass index is 29.24 kg/(m^2).  General: WDWN female in NAD  GAIT: normal  Eyes: PERRLA  Pulmonary: Non-labored, CTAB,decreased air movement in all fields,  Negative Rales, Negative rhonchi, & Negative wheezing.  Cardiac: regular Rhythm , Positive detected murmur.  VASCULAR EXAM  Carotid Bruits  Left  Right    Positive, soft  Positive, harsh   Radial pulses are 2+ palpable and equal.  LE Pulses  LEFT  RIGHT   POPLITEAL  not palpable  not palpable   POSTERIOR TIBIAL  2+ palpable  2+ palpable   DORSALIS PEDIS  ANTERIOR TIBIAL  not palpable  2+ palpable    Gastrointestinal: soft, nontender, BS WNL, no r/g, no masses palpated.  Musculoskeletal: Negative muscle atrophy/wasting. M/S 5/5 in left upper extremity, 4/5 in left lower extremity, 3/5 in right upper and lower extremities, Extremities without ischemic changes.  Neurologic: A&O X 3; Appropriate Affect ;  Speech is normal  CN 2-12 intact, Pain and light touch intact in extremities, Motor exam as listed above.    Assessment: NASHIRA MCGLYNN is a 63 y.o. female who is s/p right carotid endarterectomy with Dacron patch angioplasty on 07/19/2013 and on 03/05/14 Dr. Oneida Alar and Dr. Scot Dock inserted a stent in her left CCA.   Discussed with Dr. Oneida Alar, Vicryl absorbable suture mild inflammation, no signs of infection, follow up with Dr. Scot Dock in 1-2 weeks.  Plan: Follow-up in 1-2 weeks with Dr. Scot Dock in 1-2 weeks for left neck incision site wound check. Wash left neck incision site daily with warm soap and water. Call office if she has concerns or questions.  I discussed in depth with the patient the nature of atherosclerosis, and emphasized the importance of maximal medical management including strict control of blood pressure, blood glucose, and lipid levels,  obtaining regular exercise, and cessation of smoking.  The patient is aware that without maximal medical management the underlying atherosclerotic disease process will progress, limiting the benefit of any interventions. The patient was given information about stroke prevention and what symptoms should prompt the patient to seek immediate medical care. Thank you for allowing [place] to participate in this patient's care.  Sarah Chambers, RN, MSN, FNP-C [facility] Office: [phone]  Clinic Physician: Oneida Alar on call  05/02/2014 1:01 PM

## 2014-05-02 NOTE — Telephone Encounter (Signed)
Patient added to schedule, chart requested from HIM, dpm

## 2014-05-02 NOTE — Telephone Encounter (Signed)
Phone call from pt.  Reported she noted a swollen, red, festered area yesterday on left neck incision.  Stated she put neosporin and a bandaide on the site last night, and this morning the swelling was improved.  Reported there had been a yellow, pus-like appearance to the site, and now she sees something black starting to show in the incision.  Discussed with Dr. Oneida Alar.  Stated to have the pt. Seen today by the nurse practitioner.  Appt. Given today for 12:40 PM.

## 2014-05-08 ENCOUNTER — Other Ambulatory Visit: Payer: Self-pay | Admitting: Internal Medicine

## 2014-05-09 ENCOUNTER — Other Ambulatory Visit: Payer: Self-pay | Admitting: Emergency Medicine

## 2014-05-13 ENCOUNTER — Ambulatory Visit: Payer: Medicaid Other | Attending: Internal Medicine | Admitting: Internal Medicine

## 2014-05-13 ENCOUNTER — Encounter: Payer: Self-pay | Admitting: Internal Medicine

## 2014-05-13 VITALS — BP 150/85 | HR 100 | Temp 98.3°F | Resp 16 | Wt 169.8 lb

## 2014-05-13 DIAGNOSIS — E119 Type 2 diabetes mellitus without complications: Secondary | ICD-10-CM | POA: Diagnosis not present

## 2014-05-13 DIAGNOSIS — I10 Essential (primary) hypertension: Secondary | ICD-10-CM | POA: Diagnosis not present

## 2014-05-13 DIAGNOSIS — I2510 Atherosclerotic heart disease of native coronary artery without angina pectoris: Secondary | ICD-10-CM | POA: Diagnosis not present

## 2014-05-13 DIAGNOSIS — K551 Chronic vascular disorders of intestine: Secondary | ICD-10-CM | POA: Diagnosis not present

## 2014-05-13 DIAGNOSIS — Z7902 Long term (current) use of antithrombotics/antiplatelets: Secondary | ICD-10-CM | POA: Diagnosis not present

## 2014-05-13 DIAGNOSIS — Z9861 Coronary angioplasty status: Secondary | ICD-10-CM | POA: Insufficient documentation

## 2014-05-13 DIAGNOSIS — E785 Hyperlipidemia, unspecified: Secondary | ICD-10-CM | POA: Insufficient documentation

## 2014-05-13 DIAGNOSIS — Z7982 Long term (current) use of aspirin: Secondary | ICD-10-CM | POA: Diagnosis not present

## 2014-05-13 DIAGNOSIS — Z90710 Acquired absence of both cervix and uterus: Secondary | ICD-10-CM | POA: Diagnosis not present

## 2014-05-13 DIAGNOSIS — Z794 Long term (current) use of insulin: Secondary | ICD-10-CM | POA: Diagnosis not present

## 2014-05-13 DIAGNOSIS — M62838 Other muscle spasm: Secondary | ICD-10-CM | POA: Diagnosis not present

## 2014-05-13 DIAGNOSIS — Z791 Long term (current) use of non-steroidal anti-inflammatories (NSAID): Secondary | ICD-10-CM | POA: Insufficient documentation

## 2014-05-13 DIAGNOSIS — M545 Low back pain: Secondary | ICD-10-CM

## 2014-05-13 DIAGNOSIS — G8929 Other chronic pain: Secondary | ICD-10-CM

## 2014-05-13 DIAGNOSIS — Z9049 Acquired absence of other specified parts of digestive tract: Secondary | ICD-10-CM | POA: Insufficient documentation

## 2014-05-13 DIAGNOSIS — Z87891 Personal history of nicotine dependence: Secondary | ICD-10-CM | POA: Diagnosis not present

## 2014-05-13 DIAGNOSIS — M62830 Muscle spasm of back: Secondary | ICD-10-CM

## 2014-05-13 DIAGNOSIS — E139 Other specified diabetes mellitus without complications: Secondary | ICD-10-CM

## 2014-05-13 LAB — GLUCOSE, POCT (MANUAL RESULT ENTRY): POC GLUCOSE: 349 mg/dL — AB (ref 70–99)

## 2014-05-13 LAB — POCT GLYCOSYLATED HEMOGLOBIN (HGB A1C): Hemoglobin A1C: 9.4

## 2014-05-13 MED ORDER — TRAMADOL HCL 50 MG PO TABS: 50.0000 mg | ORAL_TABLET | Freq: Three times a day (TID) | ORAL | Status: DC | PRN

## 2014-05-13 MED ORDER — CYCLOBENZAPRINE HCL 5 MG PO TABS: 5.0000 mg | ORAL_TABLET | Freq: Every day | ORAL | Status: DC

## 2014-05-13 NOTE — Progress Notes (Signed)
MRN: [id number] Name: Sarah Alvarez  Sex: female Age: 63 y.o. DOB: [birth year]  Allergies: Review of patient's allergies indicates no known allergies.  Chief Complaint  Patient presents with  . Follow-up    HPI: Patient is 63 y.o. female who history of diabetes, chronic low back pain, hypertension hyperlipidemia comes today for followup, patient has been taking Lantus 60 units each bedtime as well as metformin 1 g twice a day and Glucotrol 5 mg twice a day as per patient she has missed doses in between, today her A1c noticed to be trended up , her blood pressure was also elevated, repeat manual blood pressure is 150/85, denies any headache dizziness chest and shortness of breath, patient also has had recent finger fracture in her left handand following up with orthopedics, she also has history of chronic low back pain and is requesting refill on tramadol as  muscle relaxant.  Past Medical History  Diagnosis Date  . Staph infection   . Restless leg syndrome   . Chronic mesenteric ischemia     s/p SMA stent 06/22/13  . Coronary artery disease   . Hypertension   . Hyperlipidemia   . Diabetes mellitus     Type  II    Past Surgical History  Procedure Laterality Date  . Cholecystectomy    . Abdominal hysterectomy    . Coronary angioplasty      ? LCX stent ~ 2000 in [place], [place] with LCX stent [facility] 09/1999 for reported re-instent stenosis  . Endarterectomy Right 07/19/2013    Procedure: ENDARTERECTOMY CAROTID-RIGHT;  Surgeon: Angelia Mould, MD;  Location: [place];  Service: Vascular;  Laterality: Right;  . Carotid endarterectomy Right 07-19-13  . Insertion of retrograde carotid stent Left 03/05/2014    Procedure: 1)Cutdown of open exposure left common carotid; 2) Retrograde left common carotid stent; 3) Repair of left common carotid artery.;  Surgeon: Elam Dutch, MD;  Location: [place];  Service: Vascular;  Laterality: Left;      Medication List       This list is  accurate as of: 05/13/14  5:33 PM.  Always use your most recent med list.               acetaminophen 650 MG CR tablet  Commonly known as:  TYLENOL  Take 650 mg by mouth every 8 (eight) hours as needed for pain.     aspirin 325 MG tablet  Take 1 tablet (325 mg total) by mouth daily.     atorvastatin 20 MG tablet  Commonly known as:  LIPITOR  Take 1 tablet (20 mg total) by mouth daily.     benazepril 5 MG tablet  Commonly known as:  LOTENSIN  Take 2 tablets (10 mg total) by mouth every morning.     clopidogrel 75 MG tablet  Commonly known as:  PLAVIX  Take 1 tablet (75 mg total) by mouth daily with breakfast.     cyclobenzaprine 5 MG tablet  Commonly known as:  FLEXERIL  Take 1 tablet (5 mg total) by mouth at bedtime.     diclofenac sodium 1 % Gel  Commonly known as:  VOLTAREN  Apply 4 g topically 4 (four) times daily.     famotidine 20 MG tablet  Commonly known as:  PEPCID  Take 1 tablet (20 mg total) by mouth 2 (two) times daily.     gabapentin 300 MG capsule  Commonly known as:  NEURONTIN  TAKE 1 CAPSULE  BY MOUTH AT BEDTIME.     glipiZIDE 5 MG tablet  Commonly known as:  GLUCOTROL  Take 1 tablet (5 mg total) by mouth 2 (two) times daily before a meal.     HYDROcodone-acetaminophen 5-325 MG per tablet  Commonly known as:  NORCO/VICODIN  Take 1-2 tablets by mouth every 4 (four) hours as needed.     insulin glargine 100 UNIT/ML injection  Commonly known as:  LANTUS  Inject 60 Units into the skin at bedtime.     loratadine 10 MG tablet  Commonly known as:  CLARITIN  Take 1 tablet (10 mg total) by mouth daily as needed for allergies or rhinitis.     metFORMIN 500 MG (MOD) 24 hr tablet  Commonly known as:  GLUMETZA  Take 1 tablet (500 mg total) by mouth 2 (two) times daily with a meal.     oxymetazoline 0.05 % nasal spray  Commonly known as:  AFRIN NASAL SPRAY  Place 1 spray into both nostrils 2 (two) times daily as needed for congestion.     traMADol 50 MG  tablet  Commonly known as:  ULTRAM  Take 1 tablet (50 mg total) by mouth every 8 (eight) hours as needed for moderate pain.     Vitamin D (Ergocalciferol) 50000 UNITS Caps capsule  Commonly known as:  DRISDOL  Take 50,000 Units by mouth every 7 (seven) days. sunday        Meds ordered this encounter  Medications  . traMADol (ULTRAM) 50 MG tablet    Sig: Take 1 tablet (50 mg total) by mouth every 8 (eight) hours as needed for moderate pain.    Dispense:  30 tablet    Refill:  0  . cyclobenzaprine (FLEXERIL) 5 MG tablet    Sig: Take 1 tablet (5 mg total) by mouth at bedtime.    Dispense:  30 tablet    Refill:  1    Immunization History  Administered Date(s) Administered  . Influenza Split 06/05/2012  . Influenza,inj,Quad PF,36+ Mos 06/12/2013, 06/21/2013  . Pneumococcal Polysaccharide-23 06/21/2013    Family History  Problem Relation Age of Onset  . Diabetes Mother   . Stroke Mother   . Hyperlipidemia Mother   . Cancer Father   . Heart disease Father     PVD and  CAROTID  . Heart attack Father   . Diabetes Sister   . Hypertension Sister   . Heart disease Sister     Before age 1  . Hyperlipidemia Sister   . Varicose Veins Sister   . Cancer Brother   . Cancer Maternal Aunt     breast cancer   . Diabetes Maternal Grandmother   . Diabetes Paternal Grandmother     History  Substance Use Topics  . Smoking status: Former Smoker -- 1.00 packs/day for 20 years    Quit date: 06/21/2013  . Smokeless tobacco: Never Used  . Alcohol Use: No    Review of Systems   As noted in HPI  Filed Vitals:   05/13/14 1710  BP: 150/85  Pulse:   Temp:   Resp:     Physical Exam  Physical Exam  Constitutional: No distress.  Eyes: EOM are normal. Pupils are equal, round, and reactive to light.  Cardiovascular: Normal rate and regular rhythm.   Pulmonary/Chest: Breath sounds normal. No respiratory distress. She has no wheezes. She has no rales.    CBC    Component  Value Date/Time   WBC 7.4 03/06/2014  0253   RBC 3.18* 03/06/2014 0253   HGB 9.5* 03/06/2014 0253   HCT 28.5* 03/06/2014 0253   PLT 229 03/06/2014 0253   MCV 89.6 03/06/2014 0253   LYMPHSABS 1.3 08/13/2013 0856   MONOABS 0.2 08/13/2013 0856   EOSABS 0.1 08/13/2013 0856   BASOSABS 0.0 08/13/2013 0856    CMP     Component Value Date/Time   NA 143 03/06/2014 0253   K 3.6* 03/06/2014 0253   CL 112 03/06/2014 0253   CO2 23 03/06/2014 0253   GLUCOSE 42* 03/06/2014 0253   BUN 7 03/06/2014 0253   CREATININE 0.46* 03/06/2014 0253   CREATININE 0.49* 08/13/2013 0856   CALCIUM 7.6* 03/06/2014 0253   PROT 6.7 03/04/2014 1603   ALBUMIN 4.1 03/04/2014 1603   AST 13 03/04/2014 1603   ALT 7 03/04/2014 1603   ALKPHOS 96 03/04/2014 1603   BILITOT 0.2* 03/04/2014 1603   GFRNONAA >90 03/06/2014 0253   GFRNONAA >89 08/13/2013 0856   GFRAA >90 03/06/2014 0253   GFRAA >89 08/13/2013 0856    Lab Results  Component Value Date/Time   CHOL 124 08/13/2013 08:56 AM    No components found for: HGA1C  Lab Results  Component Value Date/Time   AST 13 03/04/2014 04:03 PM    Assessment and Plan  Other specified diabetes mellitus without complications - Plan:  Results for orders placed or performed in visit on 05/13/14  Glucose (CBG)  Result Value Ref Range   POC Glucose 349 (A) 70 - 99 mg/dl  HgB A1c  Result Value Ref Range   Hemoglobin A1C 9.4    Hemoglobin A1c has trended up compared to last visit, she is advised to increase the dose of Lantus to 65 units, advise for compliance with her oral hypoglycemic medication, she'll continue with metformin 1 g twice a day as well as Glucotrol 5 mg twice a day, advise patient to keep the fingerstick log and bring on the next visit.  Essential hypertension, benign Still uncontrolled currently patient is on benazepril 10 mg, counseled on DASH diet, will evaluate on next visit if persistently elevated consider increasing the dose to 20 mg  daily.  Chronic lower back pain - Plan: traMADol (ULTRAM) 50 MG tablet  Back muscle spasm - Plan: cyclobenzaprine (FLEXERIL) 5 MG tablet   Return in about 3 months (around 08/13/2014) for diabetes, hypertension.  Lorayne Marek, MD

## 2014-05-13 NOTE — Progress Notes (Signed)
Patient here for follow up on her diabetes and hypertension Recently had surgery to her left hand Follows up with ortho the tenth of this month

## 2014-05-13 NOTE — Patient Instructions (Addendum)
DASH Eating Plan DASH stands for "Dietary Approaches to Stop Hypertension." The DASH eating plan is a healthy eating plan that has been shown to reduce high blood pressure (hypertension). Additional health benefits may include reducing the risk of type 2 diabetes mellitus, heart disease, and stroke. The DASH eating plan may also help with weight loss. WHAT DO I NEED TO KNOW ABOUT THE DASH EATING PLAN? For the DASH eating plan, you will follow these general guidelines:  Choose foods with a percent daily value for sodium of less than 5% (as listed on the food label).  Use salt-free seasonings or herbs instead of table salt or sea salt.  Check with your health care provider or pharmacist before using salt substitutes.  Eat lower-sodium products, often labeled as "lower sodium" or "no salt added."  Eat fresh foods.  Eat more vegetables, fruits, and low-fat dairy products.  Choose whole grains. Look for the word "whole" as the first word in the ingredient list.  Choose fish and skinless chicken or turkey more often than red meat. Limit fish, poultry, and meat to 6 oz (170 g) each day.  Limit sweets, desserts, sugars, and sugary drinks.  Choose heart-healthy fats.  Limit cheese to 1 oz (28 g) per day.  Eat more home-cooked food and less restaurant, buffet, and fast food.  Limit fried foods.  Cook foods using methods other than frying.  Limit canned vegetables. If you do use them, rinse them well to decrease the sodium.  When eating at a restaurant, ask that your food be prepared with less salt, or no salt if possible. WHAT FOODS CAN I EAT? Seek help from a dietitian for individual calorie needs. Grains Whole grain or whole wheat bread. Brown rice. Whole grain or whole wheat pasta. Quinoa, bulgur, and whole grain cereals. Low-sodium cereals. Corn or whole wheat flour tortillas. Whole grain cornbread. Whole grain crackers. Low-sodium crackers. Vegetables Fresh or frozen vegetables  (raw, steamed, roasted, or grilled). Low-sodium or reduced-sodium tomato and vegetable juices. Low-sodium or reduced-sodium tomato sauce and paste. Low-sodium or reduced-sodium canned vegetables.  Fruits All fresh, canned (in natural juice), or frozen fruits. Meat and Other Protein Products Ground beef (85% or leaner), grass-fed beef, or beef trimmed of fat. Skinless chicken or turkey. Ground chicken or turkey. Pork trimmed of fat. All fish and seafood. Eggs. Dried beans, peas, or lentils. Unsalted nuts and seeds. Unsalted canned beans. Dairy Low-fat dairy products, such as skim or 1% milk, 2% or reduced-fat cheeses, low-fat ricotta or cottage cheese, or plain low-fat yogurt. Low-sodium or reduced-sodium cheeses. Fats and Oils Tub margarines without trans fats. Light or reduced-fat mayonnaise and salad dressings (reduced sodium). Avocado. Safflower, olive, or canola oils. Natural peanut or almond butter. Other Unsalted popcorn and pretzels. The items listed above may not be a complete list of recommended foods or beverages. Contact your dietitian for more options. WHAT FOODS ARE NOT RECOMMENDED? Grains White bread. White pasta. White rice. Refined cornbread. Bagels and croissants. Crackers that contain trans fat. Vegetables Creamed or fried vegetables. Vegetables in a cheese sauce. Regular canned vegetables. Regular canned tomato sauce and paste. Regular tomato and vegetable juices. Fruits Dried fruits. Canned fruit in light or heavy syrup. Fruit juice. Meat and Other Protein Products Fatty cuts of meat. Ribs, chicken wings, bacon, sausage, bologna, salami, chitterlings, fatback, hot dogs, bratwurst, and packaged luncheon meats. Salted nuts and seeds. Canned beans with salt. Dairy Whole or 2% milk, cream, half-and-half, and cream cheese. Whole-fat or sweetened yogurt. Full-fat   cheeses or blue cheese. Nondairy creamers and whipped toppings. Processed cheese, cheese spreads, or cheese  curds. Condiments Onion and garlic salt, seasoned salt, table salt, and sea salt. Canned and packaged gravies. Worcestershire sauce. Tartar sauce. Barbecue sauce. Teriyaki sauce. Soy sauce, including reduced sodium. Steak sauce. Fish sauce. Oyster sauce. Cocktail sauce. Horseradish. Ketchup and mustard. Meat flavorings and tenderizers. Bouillon cubes. Hot sauce. Tabasco sauce. Marinades. Taco seasonings. Relishes. Fats and Oils Butter, stick margarine, lard, shortening, ghee, and bacon fat. Coconut, palm kernel, or palm oils. Regular salad dressings. Other Pickles and olives. Salted popcorn and pretzels. The items listed above may not be a complete list of foods and beverages to avoid. Contact your dietitian for more information. WHERE CAN I FIND MORE INFORMATION? National Heart, Lung, and Blood Institute: www.nhlbi.nih.gov/health/health-topics/topics/dash/ Document Released: 06/17/2011 Document Revised: 11/12/2013 Document Reviewed: 05/02/2013 ExitCare Patient Information 2015 [employer]. This information is not intended to replace advice given to you by your health care provider. Make sure you discuss any questions you have with your health care provider. Diabetes Mellitus and Food It is important for you to manage your blood sugar (glucose) level. Your blood glucose level can be greatly affected by what you eat. Eating healthier foods in the appropriate amounts throughout the day at about the same time each day will help you control your blood glucose level. It can also help slow or prevent worsening of your diabetes mellitus. Healthy eating may even help you improve the level of your blood pressure and reach or maintain a healthy weight.  HOW CAN FOOD AFFECT ME? Carbohydrates Carbohydrates affect your blood glucose level more than any other type of food. Your dietitian will help you determine how many carbohydrates to eat at each meal and teach you how to count carbohydrates. Counting  carbohydrates is important to keep your blood glucose at a healthy level, especially if you are using insulin or taking certain medicines for diabetes mellitus. Alcohol Alcohol can cause sudden decreases in blood glucose (hypoglycemia), especially if you use insulin or take certain medicines for diabetes mellitus. Hypoglycemia can be a life-threatening condition. Symptoms of hypoglycemia (sleepiness, dizziness, and disorientation) are similar to symptoms of having too much alcohol.  If your health care provider has given you approval to drink alcohol, do so in moderation and use the following guidelines:  Women should not have more than one drink per day, and men should not have more than two drinks per day. One drink is equal to:  12 oz of beer.  5 oz of wine.  1 oz of hard liquor.  Do not drink on an empty stomach.  Keep yourself hydrated. Have water, diet soda, or unsweetened iced tea.  Regular soda, juice, and other mixers might contain a lot of carbohydrates and should be counted. WHAT FOODS ARE NOT RECOMMENDED? As you make food choices, it is important to remember that all foods are not the same. Some foods have fewer nutrients per serving than other foods, even though they might have the same number of calories or carbohydrates. It is difficult to get your body what it needs when you eat foods with fewer nutrients. Examples of foods that you should avoid that are high in calories and carbohydrates but low in nutrients include:  Trans fats (most processed foods list trans fats on the Nutrition Facts label).  Regular soda.  Juice.  Candy.  Sweets, such as cake, pie, doughnuts, and cookies.  Fried foods. WHAT FOODS CAN I EAT? Have nutrient-rich foods,   which will nourish your body and keep you healthy. The food you should eat also will depend on several factors, including:  The calories you need.  The medicines you take.  Your weight.  Your blood glucose level.  Your  blood pressure level.  Your cholesterol level. You also should eat a variety of foods, including:  Protein, such as meat, poultry, fish, tofu, nuts, and seeds (lean animal proteins are best).  Fruits.  Vegetables.  Dairy products, such as milk, cheese, and yogurt (low fat is best).  Breads, grains, pasta, cereal, rice, and beans.  Fats such as olive oil, trans fat-free margarine, canola oil, avocado, and olives. DOES EVERYONE WITH DIABETES MELLITUS HAVE THE SAME MEAL PLAN? Because every person with diabetes mellitus is different, there is not one meal plan that works for everyone. It is very important that you meet with a dietitian who will help you create a meal plan that is just right for you. Document Released: 03/25/2005 Document Revised: 07/03/2013 Document Reviewed: 05/25/2013 ExitCare Patient Information 2015 [employer]. This information is not intended to replace advice given to you by your health care provider. Make sure you discuss any questions you have with your health care provider.  

## 2014-05-14 ENCOUNTER — Encounter: Payer: Self-pay | Admitting: Vascular Surgery

## 2014-05-15 ENCOUNTER — Ambulatory Visit (INDEPENDENT_AMBULATORY_CARE_PROVIDER_SITE_OTHER): Payer: Self-pay | Admitting: Vascular Surgery

## 2014-05-15 ENCOUNTER — Encounter: Payer: Self-pay | Admitting: Vascular Surgery

## 2014-05-15 VITALS — BP 125/90 | HR 102 | Temp 98.3°F | Ht 63.0 in | Wt 173.0 lb

## 2014-05-15 DIAGNOSIS — Z5189 Encounter for other specified aftercare: Secondary | ICD-10-CM

## 2014-05-15 NOTE — Progress Notes (Signed)
   Patient name: Sarah Alvarez MRN: [id number] DOB: 02-11-1951 Sex: female  REASON FOR VISIT: Follow up after carotid stent.  HPI: Sarah Alvarez is a 63 y.o. female she was last seen in our office by Clemon Chambers on 05/02/2014. She underwent a right carotid endarterectomy with Dacron patch angioplasty by myself on 07/19/2013. During that admission she also underwent PTCA and stenting of the superior mesenteric artery for chronic mesenteric ischemia. This was done by interventional radiology. She subsequently was found to have a proximal left common carotid artery stenosis and this was stented by Dr. Oneida Alar on 03/05/2014. This was done via a cutdown in the left common carotid artery. She had a small absorbable suture removed at her incision as this was inflamed. She now comes in for 1-2 week follow up visit.  She has no specific complaints. She denies fever or chills.  REVIEW OF SYSTEMS: Valu.Nieves ] denotes positive finding; [  ] denotes negative finding  CARDIOVASCULAR:  [ ]  chest pain   [ ]  dyspnea on exertion    CONSTITUTIONAL:  [ ]  fever   [ ]  chills  PHYSICAL EXAM: Filed Vitals:   05/15/14 1116  BP: 125/90  Pulse: 102  Temp: 98.3 F (36.8 C)  TempSrc: Oral  Height: 5\' 3"  (1.6 m)  Weight: 173 lb (78.472 kg)  SpO2: 100%   Body mass index is 30.65 kg/(m^2). GENERAL: The patient is a well-nourished female, in no acute distress. The vital signs are documented above. CARDIOVASCULAR: There is a regular rate and rhythm. She has bilateral carotid bruits. PULMONARY: There is good air exchange bilaterally without wheezing or rales. Her left neck incision is healed nicely. NEURO: She has no focal weakness or paresthesias.  MEDICAL ISSUES: The wound on her left neck is now completely healed. There is no erythema or drainage. She is scheduled for a carotid duplex scan in March and we'll see her at that time. She is to call sooner she has problems. She is not a smoker. She is on aspirin. She is on  a statin.  [facility]: [phone]

## 2014-06-14 ENCOUNTER — Other Ambulatory Visit: Payer: Self-pay | Admitting: Internal Medicine

## 2014-06-14 NOTE — Telephone Encounter (Signed)
benazepril (LOTENSIN) 5 MG tablet  glipiZIDE (GLUCOTROL) 5 MG tablet diclofenac sodium (VOLTAREN) 1 % GEL famotidine (PEPCID) 20 MG tablet

## 2014-06-14 NOTE — Telephone Encounter (Signed)
Patient came into the office requesting refill on her medications Refilled her lotensin, glipizide, voltaren, and pepcid [facility]

## 2014-06-19 ENCOUNTER — Other Ambulatory Visit: Payer: Self-pay

## 2014-06-19 MED ORDER — GLUCOSE BLOOD VI STRP: ORAL_STRIP | Status: DC

## 2014-06-19 MED ORDER — INSULIN GLARGINE 100 UNIT/ML ~~LOC~~ SOLN: 65.0000 [IU] | Freq: Every day | SUBCUTANEOUS | Status: DC

## 2014-06-19 MED ORDER — ACCU-CHEK MULTICLIX LANCETS MISC: Status: AC

## 2014-06-20 ENCOUNTER — Encounter (HOSPITAL_COMMUNITY): Payer: Self-pay | Admitting: Vascular Surgery

## 2014-06-26 ENCOUNTER — Encounter: Payer: Self-pay | Admitting: Radiology

## 2014-06-26 ENCOUNTER — Other Ambulatory Visit: Payer: Self-pay | Admitting: Radiology

## 2014-06-26 ENCOUNTER — Other Ambulatory Visit (HOSPITAL_COMMUNITY): Payer: Self-pay | Admitting: Interventional Radiology

## 2014-06-26 DIAGNOSIS — K551 Chronic vascular disorders of intestine: Secondary | ICD-10-CM

## 2014-06-26 DIAGNOSIS — K559 Vascular disorder of intestine, unspecified: Secondary | ICD-10-CM

## 2014-07-16 ENCOUNTER — Other Ambulatory Visit: Payer: Self-pay | Admitting: Internal Medicine

## 2014-07-19 LAB — BUN: BUN: 13 mg/dL (ref 6–23)

## 2014-07-19 LAB — CREATININE WITH EST GFR
Creat: 0.75 mg/dL (ref 0.50–1.10)
GFR, EST NON AFRICAN AMERICAN: 85 mL/min
GFR, Est African American: >89 mL/min

## 2014-07-23 ENCOUNTER — Ambulatory Visit (HOSPITAL_COMMUNITY)
Admission: RE | Admit: 2014-07-23 | Discharge: 2014-07-23 | Disposition: A | Discharge status: Home / Self Care | Payer: Medicaid Other | Source: Ambulatory Visit | Attending: Interventional Radiology | Admitting: Interventional Radiology

## 2014-07-23 ENCOUNTER — Other Ambulatory Visit (HOSPITAL_COMMUNITY): Payer: Self-pay | Admitting: Interventional Radiology

## 2014-07-23 ENCOUNTER — Other Ambulatory Visit: Payer: Self-pay | Admitting: Internal Medicine

## 2014-07-23 ENCOUNTER — Ambulatory Visit
Admission: RE | Admit: 2014-07-23 | Discharge: 2014-07-23 | Disposition: A | Discharge status: Home / Self Care | Payer: Medicaid Other | Source: Ambulatory Visit | Attending: Interventional Radiology | Admitting: Interventional Radiology

## 2014-07-23 ENCOUNTER — Encounter (HOSPITAL_COMMUNITY): Payer: Self-pay

## 2014-07-23 DIAGNOSIS — K551 Chronic vascular disorders of intestine: Secondary | ICD-10-CM | POA: Insufficient documentation

## 2014-07-23 DIAGNOSIS — K559 Vascular disorder of intestine, unspecified: Secondary | ICD-10-CM

## 2014-07-23 HISTORY — PX: IR GENERIC HISTORICAL: IMG1180011

## 2014-07-23 MED ORDER — IOHEXOL 350 MG/ML SOLN
100.0000 mL | Freq: Once | INTRAVENOUS | Status: AC | PRN
  Administered 2014-07-23: 100 mL via INTRAVENOUS

## 2014-07-23 NOTE — Progress Notes (Signed)
Patient ID: Sarah Alvarez, female   DOB: 1950-12-05, 64 y.o.   MRN: [id number]   Chief Complaint: Outpatient follow-up for mesenteric vascular occlusive disease, status post celiac and SMA intravascular stents.  Referring Physician(s): Jerelyn Trimarco  History of Present Illness: Sarah Alvarez is a 64 y.o. 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 She also has a history of coronary and carotid disease. She underwent SMA original stent placement 06/15/2013. She had recurrent intestinal angina symptoms requiring restenting of the SMA origin as well as proximal celiac origin stent insertions. Second intervention was performed June 2015 by Dr. Kathlene Cote. Since the second intervention, she has been stable. No significant recurrent abdominal or pelvic pain. No recurrent intestinal or abdominal angina pain associated with meals. She has tolerated diet. Stable weight. Negative for food fear. No significant nausea or vomiting. No change in bowel habits. She currently remains at her baseline  Past Medical History  Diagnosis Date  . Staph infection   . Restless leg syndrome   . Chronic mesenteric ischemia     s/p SMA stent 06/22/13  . Coronary artery disease   . Hypertension   . Hyperlipidemia   . Diabetes mellitus     Type  II    Past Surgical History  Procedure Laterality Date  . Cholecystectomy    . Abdominal hysterectomy    . Coronary angioplasty      ? LCX stent ~ 2000 in [place], [place] with LCX stent [facility] 09/1999 for reported re-instent stenosis  . Endarterectomy Right 07/19/2013    Procedure: ENDARTERECTOMY CAROTID-RIGHT;  Surgeon: Angelia Mould, MD;  Location: [facility];  Service: Vascular;  Laterality: Right;  . Carotid endarterectomy Right 07-19-13  . Insertion of retrograde carotid stent Left 03/05/2014    Procedure: 1)Cutdown of open exposure left common carotid; 2) Retrograde left common carotid stent; 3) Repair of left common carotid artery.;   Surgeon: Elam Dutch, MD;  Location: [facility];  Service: Vascular;  Laterality: Left;  . Arch aortogram N/A 02/11/2014    Procedure: ARCH AORTOGRAM;  Surgeon: Angelia Mould, MD;  Location: [facility] CATH LAB;  Service: Cardiovascular;  Laterality: N/A;    Allergies: Review of patient's allergies indicates no known allergies.  Medications: Prior to Admission medications   Medication Sig Start Date End Date Taking? Authorizing Provider  acetaminophen (TYLENOL) 650 MG CR tablet Take 650 mg by mouth every 8 (eight) hours as needed for pain.   Yes Historical Provider, MD  aspirin 325 MG tablet Take 1 tablet (325 mg total) by mouth daily. 06/25/13  Yes Donne Hazel, MD  atorvastatin (LIPITOR) 20 MG tablet Take 1 tablet (20 mg total) by mouth daily. 01/22/14  Yes Deepak Advani, MD  benazepril (LOTENSIN) 5 MG tablet TAKE 2 TABLETS BY MOUTH EVERY MORNING. 07/08/14  Yes Lorayne Marek, MD  clopidogrel (PLAVIX) 75 MG tablet Take 1 tablet (75 mg total) by mouth daily with breakfast. 01/22/14  Yes Deepak Advani, MD  cyclobenzaprine (FLEXERIL) 5 MG tablet Take 1 tablet (5 mg total) by mouth at bedtime. 05/13/14  Yes Deepak Advani, MD  famotidine (PEPCID) 20 MG tablet TAKE 1 TABLET BY MOUTH 2 TIMES DAILY. 07/08/14  Yes Deepak Advani, MD  gabapentin (NEURONTIN) 300 MG capsule TAKE 1 CAPSULE BY MOUTH AT BEDTIME. 05/09/14  Yes Olugbemiga E Jegede, MD  glipiZIDE (GLUCOTROL) 5 MG tablet TAKE 1 TABLET BY MOUTH 2 TIMES DAILY BEFORE A MEAL. 07/08/14  Yes Lorayne Marek, MD  glucose blood (ACCU-CHEK  AVIVA) test strip Use as instructed 06/19/14  Yes Deepak Advani, MD  HYDROcodone-acetaminophen (NORCO/VICODIN) 5-325 MG per tablet Take 1-2 tablets by mouth every 4 (four) hours as needed. 04/24/14  Yes Shari A Upstill, PA-C  insulin glargine (LANTUS) 100 UNIT/ML injection Inject 0.65 mLs (65 Units total) into the skin at bedtime. 06/19/14  Yes Lorayne Marek, MD  Lancets (ACCU-CHEK MULTICLIX) lancets Use as instructed 06/19/14   Yes Deepak Advani, MD  loratadine (CLARITIN) 10 MG tablet Take 1 tablet (10 mg total) by mouth daily as needed for allergies or rhinitis. 01/22/14  Yes Deepak Advani, MD  metFORMIN (GLUCOPHAGE) 500 MG tablet TAKE 1 TABLET BY MOUTH 2 TIMES DAILY WITH A MEAL. 07/17/14  Yes Lorayne Marek, MD  oxymetazoline (AFRIN NASAL SPRAY) 0.05 % nasal spray Place 1 spray into both nostrils 2 (two) times daily as needed for congestion. 01/22/14  Yes Lorayne Marek, MD  Vitamin D, Ergocalciferol, (DRISDOL) 50000 UNITS CAPS capsule Take 50,000 Units by mouth every 7 (seven) days. sunday   Yes Historical Provider, MD  VOLTAREN 1 % GEL APPLY 4 GRAMS TOPICALLY 4 TIMES DAILY. 07/08/14  Yes Lorayne Marek, MD  metFORMIN (GLUMETZA) 500 MG (MOD) 24 hr tablet Take 1 tablet (500 mg total) by mouth 2 (two) times daily with a meal. Patient not taking: Reported on 07/23/2014 01/22/14   Lorayne Marek, MD  traMADol (ULTRAM) 50 MG tablet Take 1 tablet (50 mg total) by mouth every 8 (eight) hours as needed for moderate pain. Patient not taking: Reported on 07/23/2014 05/13/14   Lorayne Marek, MD    Family History  Problem Relation Age of Onset  . Diabetes Mother   . Stroke Mother   . Hyperlipidemia Mother   . Cancer Father   . Heart disease Father     PVD and  CAROTID  . Heart attack Father   . Diabetes Sister   . Hypertension Sister   . Heart disease Sister     Before age 95  . Hyperlipidemia Sister   . Varicose Veins Sister   . Cancer Brother   . Cancer Maternal Aunt     breast cancer   . Diabetes Maternal Grandmother   . Diabetes Paternal Grandmother     History   Social History  . Marital Status: Divorced    Spouse Name: N/A    Number of Children: N/A  . Years of Education: N/A   Social History Main Topics  . Smoking status: Former Smoker -- 1.00 packs/day for 20 years    Quit date: 06/21/2013  . Smokeless tobacco: Never Used  . Alcohol Use: No  . Drug Use: No  . Sexual Activity: No   Other Topics Concern    . Not on file   Social History Narrative    Review of Systems  Constitutional: Negative for fever, diaphoresis, activity change and appetite change.  Respiratory: Negative for cough, chest tightness and shortness of breath.   Cardiovascular: Negative for chest pain and palpitations.  Gastrointestinal: Negative.  Negative for abdominal distention.  Genitourinary: Negative for dysuria and flank pain.  Psychiatric/Behavioral: Negative for behavioral problems and agitation.    Vital Signs: BP 78/56 mmHg  Pulse 106  Temp(Src) 98.1 F (36.7 C) (Oral)  Resp 13  Ht 5\' 2"  (1.575 m)  Wt 160 lb (72.576 kg)  BMI 29.26 kg/m2  SpO2 99%  Physical Exam  Constitutional: She appears well-developed and well-nourished. She appears distressed.  Neck:  Positive for bilateral carotid bruits.  Cardiovascular: Normal  rate and regular rhythm.  Exam reveals no gallop and no friction rub.   Murmur heard. Pulmonary/Chest: Effort normal and breath sounds normal. No respiratory distress. She has no wheezes. She has no rales.  Abdominal: Soft. Bowel sounds are normal. She exhibits no distension and no mass. There is no rebound and no guarding. No hernia.  Skin: She is not diaphoretic.  Psychiatric: She has a normal mood and affect. Her behavior is normal. Judgment and thought content normal.    Imaging:  repeat CTA today confirms patency of the celiac and SMA stents. Incidentally, there is stent fracture of both the celiac and SMA stents. Celiac, SMA, and IMA remain patent. Difficult to exclude mild IntraStent hyperplasia or narrowing. Negative for occlusion. CTA correlates with the patient's clinical response.  Labs:  CBC:  Recent Labs  08/13/13 0856 08/17/13 0830 01/28/14 1327 02/11/14 0915 03/04/14 1603 03/06/14 0253  WBC 5.7 6.1  --   --  7.6 7.4  HGB 11.9* 11.7* 12.6 13.3 12.7 9.5*  HCT 35.9* 34.5* 37.0 39.0 36.4 28.5*  PLT 318 286  --   --  296 229    COAGS:  Recent Labs   08/17/13 0830 03/04/14 1603  INR 1.04 0.98  APTT 28 27    BMP:  Recent Labs  08/13/13 0856  08/17/13 0830 01/28/14 1327 02/11/14 0915 03/04/14 1603 03/06/14 0253 07/18/14 1655  NA 141  --  140 140 142 144 143  --   K 4.5  --  3.9 4.1 4.8 4.8 3.6*  --   CL 107  --  104 106 101 106 112  --   CO2 26  --  23  --   --  25 23  --   GLUCOSE 245*  --  117* 213* 69* 117* 42*  --   BUN 11  --  12 15 9 15 7 13   CALCIUM 9.1  --  8.8  --   --  9.4 7.6*  --   CREATININE 0.49*  < > 0.48* 0.60 0.60 0.78 0.46* 0.75  GFRNONAA >89  --  >90  --   --  87* >90 85  GFRAA >89  --  >90  --   --  >90 >90 >89  < > = values in this interval not displayed.  LIVER FUNCTION TESTS:  Recent Labs  08/13/13 0856 08/17/13 0830 03/04/14 1603  BILITOT 0.2 <0.2* 0.2*  AST 10 12 13   ALT 8 6 7   ALKPHOS 84 81 96  PROT 5.9* 6.4 6.7  ALBUMIN 3.9 3.8 4.1     Assessment and Plan:   Status post celiac and SMA stents for chronic systematic mesenteric vascular occlusive disease. No recurrent abdominal pain, intestinal angina, diet changes, or change in appetite. Stable bowel habits. No recurrent mesenteric ischemia. Stable CTA.  Plan: Continue aspirin and Plavix antiplatelet therapy. Outpatient follow-up in 12 months with a repeat CTA abdomen only    I spent a total of 30 minutes with the patient in face to face in clinical consultation, greater than 50% of which was counseling/coordinating care for the patient.  SignedGreggory Keen 07/23/2014, 3:24 PM

## 2014-08-13 ENCOUNTER — Ambulatory Visit: Payer: Medicaid Other | Attending: Internal Medicine | Admitting: Internal Medicine

## 2014-08-13 ENCOUNTER — Encounter: Payer: Self-pay | Admitting: Internal Medicine

## 2014-08-13 VITALS — BP 159/72 | HR 98 | Temp 98.0°F | Resp 16 | Wt 168.8 lb

## 2014-08-13 DIAGNOSIS — Z794 Long term (current) use of insulin: Secondary | ICD-10-CM | POA: Insufficient documentation

## 2014-08-13 DIAGNOSIS — M545 Low back pain: Secondary | ICD-10-CM | POA: Insufficient documentation

## 2014-08-13 DIAGNOSIS — E7800 Pure hypercholesterolemia, unspecified: Secondary | ICD-10-CM

## 2014-08-13 DIAGNOSIS — E119 Type 2 diabetes mellitus without complications: Secondary | ICD-10-CM | POA: Insufficient documentation

## 2014-08-13 DIAGNOSIS — G8929 Other chronic pain: Secondary | ICD-10-CM | POA: Diagnosis not present

## 2014-08-13 DIAGNOSIS — I10 Essential (primary) hypertension: Secondary | ICD-10-CM | POA: Diagnosis not present

## 2014-08-13 DIAGNOSIS — I2510 Atherosclerotic heart disease of native coronary artery without angina pectoris: Secondary | ICD-10-CM | POA: Diagnosis not present

## 2014-08-13 DIAGNOSIS — Z87891 Personal history of nicotine dependence: Secondary | ICD-10-CM | POA: Diagnosis not present

## 2014-08-13 DIAGNOSIS — E139 Other specified diabetes mellitus without complications: Secondary | ICD-10-CM

## 2014-08-13 LAB — HEMOGLOBIN A1C
HEMOGLOBIN A1C: 13.4 % — AB (ref <5.7)
Mean Plasma Glucose: 338 mg/dL — ABNORMAL HIGH (ref <117)

## 2014-08-13 LAB — COMPLETE METABOLIC PANEL WITH GFR
ALBUMIN: 3.9 g/dL (ref 3.5–5.2)
ALT: 10 U/L (ref 0–35)
AST: 12 U/L (ref 0–37)
Alkaline Phosphatase: 84 U/L (ref 39–117)
BILIRUBIN TOTAL: 0.3 mg/dL (ref 0.2–1.2)
BUN: 14 mg/dL (ref 6–23)
CHLORIDE: 105 meq/L (ref 96–112)
CO2: 26 mEq/L (ref 19–32)
CREATININE: 0.67 mg/dL (ref 0.50–1.10)
Calcium: 9.7 mg/dL (ref 8.4–10.5)
GFR, Est African American: >89 mL/min
GFR, Est Non African American: >89 mL/min
GLUCOSE: 268 mg/dL — AB (ref 70–99)
Potassium: 5.1 mEq/L (ref 3.5–5.3)
Sodium: 139 mEq/L (ref 135–145)
Total Protein: 6.1 g/dL (ref 6.0–8.3)

## 2014-08-13 LAB — POCT GLYCOSYLATED HEMOGLOBIN (HGB A1C)

## 2014-08-13 LAB — GLUCOSE, POCT (MANUAL RESULT ENTRY): POC Glucose: 287 mg/dl — AB (ref 70–99)

## 2014-08-13 MED ORDER — INSULIN GLARGINE 100 UNIT/ML ~~LOC~~ SOLN: 65.0000 [IU] | Freq: Every day | SUBCUTANEOUS | Status: DC

## 2014-08-13 MED ORDER — ATORVASTATIN CALCIUM 20 MG PO TABS: 20.0000 mg | ORAL_TABLET | Freq: Every day | ORAL | Status: DC

## 2014-08-13 MED ORDER — CYCLOBENZAPRINE HCL 5 MG PO TABS: 5.0000 mg | ORAL_TABLET | Freq: Every day | ORAL | Status: DC

## 2014-08-13 MED ORDER — GLIPIZIDE 10 MG PO TABS: ORAL_TABLET | ORAL | Status: DC

## 2014-08-13 MED ORDER — FAMOTIDINE 20 MG PO TABS: ORAL_TABLET | ORAL | Status: DC

## 2014-08-13 MED ORDER — CLOPIDOGREL BISULFATE 75 MG PO TABS: 75.0000 mg | ORAL_TABLET | Freq: Every day | ORAL | Status: DC

## 2014-08-13 MED ORDER — INSULIN ASPART 100 UNIT/ML ~~LOC~~ SOLN
10.0000 [IU] | Freq: Once | SUBCUTANEOUS | Status: AC
  Administered 2014-08-13: 10 [IU] via SUBCUTANEOUS

## 2014-08-13 MED ORDER — METFORMIN HCL 1000 MG PO TABS: ORAL_TABLET | ORAL | Status: DC

## 2014-08-13 MED ORDER — GABAPENTIN 300 MG PO CAPS: ORAL_CAPSULE | ORAL | Status: DC

## 2014-08-13 MED ORDER — DICLOFENAC SODIUM 1 % TD GEL: TRANSDERMAL | Status: DC

## 2014-08-13 MED ORDER — BENAZEPRIL HCL 5 MG PO TABS: ORAL_TABLET | ORAL | Status: DC

## 2014-08-13 NOTE — Progress Notes (Signed)
MRN: [id number] Name: Sarah Alvarez  Sex: female Age: 64 y.o. DOB: [birth year]  Allergies: Review of patient's allergies indicates no known allergies.  Chief Complaint  Patient presents with  . Follow-up    HPI: Patient is 64 y.o. female who history of diabetes hypertension hyperlipidemia, CAD status post PCI with stents, comes today for followup she reported to have ran out of her Lantus medication for one weekbut has been taking her metformin and Glucotrol, today noticed her hemoglobin A1c has trended up, patient reported that her fasting sugar is usually between 100 to 150 mg/dL, denies any hypoglycemic symptoms.patient also has lost follow up with her cardiologist and is requesting another referral.  Past Medical History  Diagnosis Date  . Staph infection   . Restless leg syndrome   . Chronic mesenteric ischemia     s/p SMA stent 06/22/13  . Coronary artery disease   . Hypertension   . Hyperlipidemia   . Diabetes mellitus     Type  II    Past Surgical History  Procedure Laterality Date  . Cholecystectomy    . Abdominal hysterectomy    . Coronary angioplasty      ? LCX stent ~ 2000 in [place], [place] with LCX stent [facility] 09/1999 for reported re-instent stenosis  . Endarterectomy Right 07/19/2013    Procedure: ENDARTERECTOMY CAROTID-RIGHT;  Surgeon: Angelia Mould, MD;  Location: [place];  Service: Vascular;  Laterality: Right;  . Carotid endarterectomy Right 07-19-13  . Insertion of retrograde carotid stent Left 03/05/2014    Procedure: 1)Cutdown of open exposure left common carotid; 2) Retrograde left common carotid stent; 3) Repair of left common carotid artery.;  Surgeon: Elam Dutch, MD;  Location: [place];  Service: Vascular;  Laterality: Left;  . Arch aortogram N/A 02/11/2014    Procedure: ARCH AORTOGRAM;  Surgeon: Angelia Mould, MD;  Location: [facility] CATH LAB;  Service: Cardiovascular;  Laterality: N/A;      Medication List       This list is accurate as  of: 08/13/14  3:06 PM.  Always use your most recent med list.               accu-chek multiclix lancets  Use as instructed     acetaminophen 650 MG CR tablet  Commonly known as:  TYLENOL  Take 650 mg by mouth every 8 (eight) hours as needed for pain.     aspirin 325 MG tablet  Take 1 tablet (325 mg total) by mouth daily.     atorvastatin 20 MG tablet  Commonly known as:  LIPITOR  Take 1 tablet (20 mg total) by mouth daily.     benazepril 5 MG tablet  Commonly known as:  LOTENSIN  TAKE 2 TABLETS BY MOUTH EVERY MORNING.     clopidogrel 75 MG tablet  Commonly known as:  PLAVIX  Take 1 tablet (75 mg total) by mouth daily with breakfast.     cyclobenzaprine 5 MG tablet  Commonly known as:  FLEXERIL  Take 1 tablet (5 mg total) by mouth at bedtime.     diclofenac sodium 1 % Gel  Commonly known as:  VOLTAREN  APPLY 4 GRAMS TOPICALLY 4 TIMES DAILY.     famotidine 20 MG tablet  Commonly known as:  PEPCID  TAKE 1 TABLET BY MOUTH 2 TIMES DAILY.     gabapentin 300 MG capsule  Commonly known as:  NEURONTIN  Take  2 time a day .  glipiZIDE 10 MG tablet  Commonly known as:  GLUCOTROL  TAKE 1 TABLET BY MOUTH 2 TIMES DAILY BEFORE A MEAL.     glucose blood test strip  Commonly known as:  ACCU-CHEK AVIVA  Use as instructed     HYDROcodone-acetaminophen 5-325 MG per tablet  Commonly known as:  NORCO/VICODIN  Take 1-2 tablets by mouth every 4 (four) hours as needed.     insulin glargine 100 UNIT/ML injection  Commonly known as:  LANTUS  Inject 0.65 mLs (65 Units total) into the skin at bedtime.     loratadine 10 MG tablet  Commonly known as:  CLARITIN  Take 1 tablet (10 mg total) by mouth daily as needed for allergies or rhinitis.     metFORMIN 500 MG (MOD) 24 hr tablet  Commonly known as:  GLUMETZA  Take 1 tablet (500 mg total) by mouth 2 (two) times daily with a meal.     metFORMIN 1000 MG tablet  Commonly known as:  GLUCOPHAGE  TAKE 1 TABLET BY MOUTH 2 TIMES DAILY  WITH A MEAL.     oxymetazoline 0.05 % nasal spray  Commonly known as:  AFRIN NASAL SPRAY  Place 1 spray into both nostrils 2 (two) times daily as needed for congestion.     traMADol 50 MG tablet  Commonly known as:  ULTRAM  Take 1 tablet (50 mg total) by mouth every 8 (eight) hours as needed for moderate pain.     Vitamin D (Ergocalciferol) 50000 UNITS Caps capsule  Commonly known as:  DRISDOL  Take 50,000 Units by mouth every 7 (seven) days. sunday        Meds ordered this encounter  Medications  . atorvastatin (LIPITOR) 20 MG tablet    Sig: Take 1 tablet (20 mg total) by mouth daily.    Dispense:  30 tablet    Refill:  5  . benazepril (LOTENSIN) 5 MG tablet    Sig: TAKE 2 TABLETS BY MOUTH EVERY MORNING.    Dispense:  60 tablet    Refill:  3  . clopidogrel (PLAVIX) 75 MG tablet    Sig: Take 1 tablet (75 mg total) by mouth daily with breakfast.    Dispense:  30 tablet    Refill:  3  . cyclobenzaprine (FLEXERIL) 5 MG tablet    Sig: Take 1 tablet (5 mg total) by mouth at bedtime.    Dispense:  30 tablet    Refill:  1  . famotidine (PEPCID) 20 MG tablet    Sig: TAKE 1 TABLET BY MOUTH 2 TIMES DAILY.    Dispense:  60 tablet    Refill:  3  . gabapentin (NEURONTIN) 300 MG capsule    Sig: Take  2 time a day .    Dispense:  60 capsule    Refill:  3  . glipiZIDE (GLUCOTROL) 10 MG tablet    Sig: TAKE 1 TABLET BY MOUTH 2 TIMES DAILY BEFORE A MEAL.    Dispense:  60 tablet    Refill:  3  . insulin glargine (LANTUS) 100 UNIT/ML injection    Sig: Inject 0.65 mLs (65 Units total) into the skin at bedtime.    Dispense:  30 mL    Refill:  3  . metFORMIN (GLUCOPHAGE) 1000 MG tablet    Sig: TAKE 1 TABLET BY MOUTH 2 TIMES DAILY WITH A MEAL.    Dispense:  60 tablet    Refill:  3    2nd requst - 1st req  sent 06/10/14 - pt last seen 05/15/14 pt has been out since 06/10/14 [facility],R[facility]  . diclofenac sodium (VOLTAREN) 1 % GEL    Sig: APPLY 4 GRAMS TOPICALLY 4 TIMES DAILY.    Dispense:  100  g    Refill:  2  . insulin aspart (novoLOG) injection 10 Units    Sig:     Immunization History  Administered Date(s) Administered  . Influenza Split 06/05/2012  . Influenza,inj,Quad PF,36+ Mos 06/12/2013, 06/21/2013  . Pneumococcal Polysaccharide-23 06/21/2013    Family History  Problem Relation Age of Onset  . Diabetes Mother   . Stroke Mother   . Hyperlipidemia Mother   . Cancer Father   . Heart disease Father     PVD and  CAROTID  . Heart attack Father   . Diabetes Sister   . Hypertension Sister   . Heart disease Sister     Before age 31  . Hyperlipidemia Sister   . Varicose Veins Sister   . Cancer Brother   . Cancer Maternal Aunt     breast cancer   . Diabetes Maternal Grandmother   . Diabetes Paternal Grandmother     History  Substance Use Topics  . Smoking status: Former Smoker -- 1.00 packs/day for 20 years    Quit date: 06/21/2013  . Smokeless tobacco: Never Used  . Alcohol Use: No    Review of Systems   As noted in HPI  Filed Vitals:   08/13/14 1423  BP: 159/72  Pulse: 98  Temp: 98 F (36.7 C)  Resp: 16    Physical Exam  Physical Exam  Constitutional: No distress.  Eyes: EOM are normal. Pupils are equal, round, and reactive to light.  Cardiovascular: Normal rate and regular rhythm.   Pulmonary/Chest: Breath sounds normal. No respiratory distress. She has no wheezes. She has no rales.  Musculoskeletal: She exhibits no edema.    CBC    Component Value Date/Time   WBC 7.4 03/06/2014 0253   RBC 3.18* 03/06/2014 0253   HGB 9.5* 03/06/2014 0253   HCT 28.5* 03/06/2014 0253   PLT 229 03/06/2014 0253   MCV 89.6 03/06/2014 0253   LYMPHSABS 1.3 08/13/2013 0856   MONOABS 0.2 08/13/2013 0856   EOSABS 0.1 08/13/2013 0856   BASOSABS 0.0 08/13/2013 0856    CMP     Component Value Date/Time   NA 143 03/06/2014 0253   K 3.6* 03/06/2014 0253   CL 112 03/06/2014 0253   CO2 23 03/06/2014 0253   GLUCOSE 42* 03/06/2014 0253   BUN 13  07/18/2014 1655   CREATININE 0.75 07/18/2014 1655   CREATININE 0.46* 03/06/2014 0253   CALCIUM 7.6* 03/06/2014 0253   PROT 6.7 03/04/2014 1603   ALBUMIN 4.1 03/04/2014 1603   AST 13 03/04/2014 1603   ALT 7 03/04/2014 1603   ALKPHOS 96 03/04/2014 1603   BILITOT 0.2* 03/04/2014 1603   GFRNONAA 85 07/18/2014 1655   GFRNONAA >90 03/06/2014 0253   GFRAA >89 07/18/2014 1655   GFRAA >90 03/06/2014 0253    Lab Results  Component Value Date/Time   CHOL 124 08/13/2013 08:56 AM    No components found for: HGA1C  Lab Results  Component Value Date/Time   AST 13 03/04/2014 04:03 PM    Assessment and Plan  Other specified diabetes mellitus without complications - Plan:  Results for orders placed or performed in visit on 08/13/14  Glucose (CBG)  Result Value Ref Range   POC Glucose 287.0 (A) 70 -  99 mg/dl  HgB A1c  Result Value Ref Range   Hemoglobin A1C >15.0    Diabetes is uncontrolled, HgB A1c has trended up from 9% to more than 15% ? Patient ran out of her insulin, I have increased the dose of Glucotrol, she'll continue with metformin, Lantus she will continue 65 units and uptitrate if her fasting sugar is persistently more than 140 mg/dL, patient understand and verbalized instructions, she'll keep the fingerstick log. , gabapentin (NEURONTIN) 300 MG capsule, glipiZIDE (GLUCOTROL) 10 MG tablet, insulin glargine (LANTUS) 100 UNIT/ML injection, metFORMIN (GLUCOPHAGE) 1000 MG tablet, insulin aspart (novoLOG) injection 10 Units, COMPLETE METABOLIC PANEL WITH GFR, Hemoglobin A1c  High cholesterol - Plan: Continue with atorvastatin (LIPITOR) 20 MG tablet, will get fasting lipid panel on the next visit.  Chronic lower back pain - Plan: diclofenac sodium (VOLTAREN) 1 % GEL  Essential hypertension, benign - Plan:advised patient for DASH diet, continue with  benazepril (LOTENSIN) 5 MG tablet, COMPLETE METABOLIC PANEL WITH GFR  CAD in native artery - Plan: clopidogrel (PLAVIX) 75 MG tablet,  Ambulatory referral to Cardiology    Return in about 3 months (around 11/11/2014) for diabetes, hypertension.  Lorayne Marek, MD

## 2014-08-13 NOTE — Progress Notes (Signed)
Patient here for three month follow up on her diabetes And medication refills

## 2014-08-16 ENCOUNTER — Telehealth: Payer: Self-pay

## 2014-08-16 NOTE — Telephone Encounter (Signed)
Pt aware of lab results 

## 2014-08-16 NOTE — Telephone Encounter (Signed)
LVM to return call.

## 2014-08-16 NOTE — Telephone Encounter (Signed)
-----   Message from Lorayne Marek, MD sent at 08/14/2014 12:27 PM EST ----- Blood work reviewed, call and let the patient know that her blood sugar is elevated, need to have  better diabetes control, advise patient for low carbohydrate diet and as we discussed during the visit she'll continue with her insulin and we increased the dose of Glucotrol , will repeat A1c in 3 months.

## 2014-09-16 ENCOUNTER — Encounter: Payer: Self-pay | Admitting: Internal Medicine

## 2014-09-16 ENCOUNTER — Ambulatory Visit (INDEPENDENT_AMBULATORY_CARE_PROVIDER_SITE_OTHER): Payer: Medicaid Other | Admitting: Internal Medicine

## 2014-09-16 VITALS — BP 150/74 | HR 100 | Ht 62.0 in | Wt 182.8 lb

## 2014-09-16 DIAGNOSIS — R0602 Shortness of breath: Secondary | ICD-10-CM

## 2014-09-16 LAB — BASIC METABOLIC PANEL
BUN: 10 mg/dL (ref 6–23)
CHLORIDE: 108 meq/L (ref 96–112)
CO2: 28 mEq/L (ref 19–32)
CREATININE: 0.59 mg/dL (ref 0.40–1.20)
Calcium: 8.9 mg/dL (ref 8.4–10.5)
GFR: 109.21 mL/min (ref >60.00)
Glucose, Bld: 68 mg/dL — ABNORMAL LOW (ref 70–99)
Potassium: 4 mEq/L (ref 3.5–5.1)
Sodium: 138 mEq/L (ref 135–145)

## 2014-09-16 LAB — BRAIN NATRIURETIC PEPTIDE: Pro B Natriuretic peptide (BNP): 152 pg/mL — ABNORMAL HIGH (ref 0.0–100.0)

## 2014-09-16 LAB — CBC WITH DIFFERENTIAL/PLATELET
Basophils Absolute: 0 10*3/uL (ref 0.0–0.1)
Basophils Relative: 0.5 % (ref 0.0–3.0)
EOS ABS: 0.1 10*3/uL (ref 0.0–0.7)
Eosinophils Relative: 2.1 % (ref 0.0–5.0)
HCT: 31.6 % — ABNORMAL LOW (ref 36.0–46.0)
HEMOGLOBIN: 10.7 g/dL — AB (ref 12.0–15.0)
LYMPHS PCT: 27.9 % (ref 12.0–46.0)
Lymphs Abs: 1.6 10*3/uL (ref 0.7–4.0)
MCHC: 33.8 g/dL (ref 30.0–36.0)
MCV: 92.2 fl (ref 78.0–100.0)
MONOS PCT: 5.1 % (ref 3.0–12.0)
Monocytes Absolute: 0.3 10*3/uL (ref 0.1–1.0)
NEUTROS PCT: 64.4 % (ref 43.0–77.0)
Neutro Abs: 3.8 10*3/uL (ref 1.4–7.7)
PLATELETS: 329 10*3/uL (ref 150.0–400.0)
RBC: 3.43 Mil/uL — AB (ref 3.87–5.11)
RDW: 14.9 % (ref 11.5–15.5)
WBC: 5.8 10*3/uL (ref 4.0–10.5)

## 2014-09-16 LAB — LIPID PANEL
CHOL/HDL RATIO: 3
Cholesterol: 166 mg/dL (ref 0–200)
HDL: 52.1 mg/dL (ref >39.00)
LDL CALC: 92 mg/dL (ref 0–99)
NonHDL: 113.9
Triglycerides: 112 mg/dL (ref 0.0–149.0)
VLDL: 22.4 mg/dL (ref 0.0–40.0)

## 2014-09-16 NOTE — Patient Instructions (Signed)
Your physician recommends that you continue on your current medications as directed. Please refer to the Current Medication list given to you today.  Your physician recommends that you to have labs today CBC, BMET, BNP, and Lipid.  Your physician has requested that you have an echocardiogram. Echocardiography is a painless test that uses sound waves to create images of your heart. It provides your doctor with information about the size and shape of your heart and how well your heart's chambers and valves are working. This procedure takes approximately one hour. There are no restrictions for this procedure.  Your physician has requested that you have a lexiscan myoview. For further information please visit [email or web address]. Please follow instruction sheet, as given.  Your physician wants you to follow-up in: 6 month with Dr. Harrington Challenger. You will receive a reminder letter in the mail two months in advance. If you don't receive a letter, please call our office to schedule the follow-up appointment.

## 2014-09-16 NOTE — Progress Notes (Addendum)
Cardiology Office Note   Date:  09/16/2014   ID:  Sarah Alvarez, DOB January 10, 1951, MRN [id number]  PCP:  Lorayne Marek, MD  Cardiologist:   Dorris Carnes, MD   Patient presents for continued f/u of chest pain     History of Present Illness: Sarah Alvarez is a 64 y.o. female with a history of CAD Stents done in [facility] and [place] ([place], [place])  Lives in [place]   Last seen a long time ago.  Her anginal equivalent appears to be numbness in L arm  She hs not had any numbness recently   Gets occasional SOB  With activity Has noticed she has some PND  New  1 to 2x per wk     Gets swelling in L leg  Has DM for over 15 years  Sugars 70s to 250  Last A1c over 9    Current Outpatient Prescriptions  Medication Sig Dispense Refill  . acetaminophen (TYLENOL) 650 MG CR tablet Take 650 mg by mouth every 8 (eight) hours as needed for pain.    Marland Kitchen aspirin 325 MG tablet Take 1 tablet (325 mg total) by mouth daily.    Marland Kitchen atorvastatin (LIPITOR) 20 MG tablet Take 1 tablet (20 mg total) by mouth daily. 30 tablet 5  . benazepril (LOTENSIN) 5 MG tablet TAKE 2 TABLETS BY MOUTH EVERY MORNING. 60 tablet 3  . clopidogrel (PLAVIX) 75 MG tablet Take 1 tablet (75 mg total) by mouth daily with breakfast. 30 tablet 3  . cyclobenzaprine (FLEXERIL) 5 MG tablet Take 1 tablet (5 mg total) by mouth at bedtime. 30 tablet 1  . diclofenac sodium (VOLTAREN) 1 % GEL APPLY 4 GRAMS TOPICALLY 4 TIMES DAILY. 100 g 2  . famotidine (PEPCID) 20 MG tablet TAKE 1 TABLET BY MOUTH 2 TIMES DAILY. 60 tablet 3  . gabapentin (NEURONTIN) 300 MG capsule Take  2 time a day . 60 capsule 3  . glipiZIDE (GLUCOTROL) 10 MG tablet TAKE 1 TABLET BY MOUTH 2 TIMES DAILY BEFORE A MEAL. 60 tablet 3  . glucose blood (ACCU-CHEK AVIVA) test strip Use as instructed 100 each 12  . insulin glargine (LANTUS) 100 UNIT/ML injection Inject 0.65 mLs (65 Units total) into the skin at bedtime. 30 mL 3  . Lancets (ACCU-CHEK MULTICLIX) lancets Use as  instructed 100 each 12  . loratadine (CLARITIN) 10 MG tablet Take 1 tablet (10 mg total) by mouth daily as needed for allergies or rhinitis. 30 tablet 11  . metFORMIN (GLUCOPHAGE) 1000 MG tablet TAKE 1 TABLET BY MOUTH 2 TIMES DAILY WITH A MEAL. 60 tablet 3  . metFORMIN (GLUMETZA) 500 MG (MOD) 24 hr tablet Take 1 tablet (500 mg total) by mouth 2 (two) times daily with a meal. (Patient not taking: Reported on 07/23/2014) 60 tablet 2  . oxymetazoline (AFRIN NASAL SPRAY) 0.05 % nasal spray Place 1 spray into both nostrils 2 (two) times daily as needed for congestion. 30 mL 0  . Vitamin D, Ergocalciferol, (DRISDOL) 50000 UNITS CAPS capsule Take 50,000 Units by mouth every 7 (seven) days. sunday    . [DISCONTINUED] colchicine 0.6 MG tablet Take two tab PO, then take 1 tab PO 1 hour later 6 tablet 0   No current facility-administered medications for this visit.    Allergies:   Review of patient's allergies indicates no known allergies.   Past Medical History  Diagnosis Date  . Staph infection   . Restless leg syndrome   . Chronic mesenteric  ischemia     s/p SMA stent 06/22/13  . Coronary artery disease   . Hypertension   . Hyperlipidemia   . Diabetes mellitus     Type  II    Past Surgical History  Procedure Laterality Date  . Cholecystectomy    . Abdominal hysterectomy    . Coronary angioplasty      ? LCX stent ~ 2000 in [place], [place] with LCX stent [facility] 09/1999 for reported re-instent stenosis  . Endarterectomy Right 07/19/2013    Procedure: ENDARTERECTOMY CAROTID-RIGHT;  Surgeon: Angelia Mould, MD;  Location: [place];  Service: Vascular;  Laterality: Right;  . Carotid endarterectomy Right 07-19-13  . Insertion of retrograde carotid stent Left 03/05/2014    Procedure: 1)Cutdown of open exposure left common carotid; 2) Retrograde left common carotid stent; 3) Repair of left common carotid artery.;  Surgeon: Elam Dutch, MD;  Location: [place];  Service: Vascular;  Laterality: Left;  .  Arch aortogram N/A 02/11/2014    Procedure: ARCH AORTOGRAM;  Surgeon: Angelia Mould, MD;  Location: [facility] CATH LAB;  Service: Cardiovascular;  Laterality: N/A;     Social History:  The patient  reports that she quit smoking about 14 months ago. She has never used smokeless tobacco. She reports that she does not drink alcohol or use illicit drugs.   Family History:  The patient's family history includes Cancer in her brother, father, and maternal aunt; Diabetes in her maternal grandmother, mother, paternal grandmother, and sister; Heart attack in her father; Heart disease in her father and sister; Hyperlipidemia in her mother and sister; Hypertension in her sister; Stroke in her mother; Varicose Veins in her sister.    ROS:  Please see the history of present illness. All other systems are reviewed and  Negative to the above problem except as noted.    PHYSICAL EXAM: VS:  BP 150/74 mmHg  Pulse 100  Ht 5\' 2"  (1.575 m)  Wt 182 lb 12.8 oz (82.918 kg)  BMI 33.43 kg/m2  GEN: Well nourished, well developed, in no acute distress HEENT: normal Neck: no JVD, Bruits bilatearl , or masses Cardiac: RRR; II/VI systolic murmur LSBb, rubs, or gallops,1-2+  edema (L gr R) Respiratory:  clear to auscultation bilaterally, normal work of breathing GI: soft, nontender, nondistended, + BS  No hepatomegaly  MS: no deformity Moving all extremities   Skin: warm and dry, no rash Neuro:  Strength and sensation are intact Psych: euthymic mood, full affect   EKG:  EKG is ordered today.  SR 100 bpm  Septal MI     Lipid Panel    Component Value Date/Time   CHOL 124 08/13/2013 0856   TRIG 105 08/13/2013 0856   HDL 38* 08/13/2013 0856   CHOLHDL 3.3 08/13/2013 0856   VLDL 21 08/13/2013 0856   LDLCALC 65 08/13/2013 0856      Wt Readings from Last 3 Encounters:  09/16/14 182 lb 12.8 oz (82.918 kg)  08/13/14 168 lb 12.8 oz (76.567 kg)  07/23/14 160 lb (72.576 kg)      ASSESSMENT AND PLAN:  1.   SOB  Need to get CBC, TSH, BNP  Would also set up for lexiscan myoview and echo  Need to get records from Dr Clayborn Bigness Patinet with peripheral edema on exam   2.  CAD As noted above  3  HL  Check lipids    Signed, Dorris Carnes, MD  09/16/2014 12:19 PM    [address], [place], [place]  [address] Phone: [phone]; Fax: [phone]    Addendum  (10/01/14):  Patinet had echo that showed normal LVEF  Increased filling pressures Myoview shows small are of severe ischemia inferiorly  .  I have reviewed  It appears signif I would recomm R and L heart cath to define, given that patient has had progressive SOB Patient understands risks  Agrees to proceed  Dorris Carnes

## 2014-09-24 ENCOUNTER — Encounter: Payer: Self-pay | Admitting: Vascular Surgery

## 2014-09-25 ENCOUNTER — Ambulatory Visit (INDEPENDENT_AMBULATORY_CARE_PROVIDER_SITE_OTHER): Payer: Medicaid Other | Admitting: Vascular Surgery

## 2014-09-25 ENCOUNTER — Ambulatory Visit (HOSPITAL_COMMUNITY)
Admission: RE | Admit: 2014-09-25 | Discharge: 2014-09-25 | Disposition: A | Discharge status: Home / Self Care | Payer: Medicaid Other | Source: Ambulatory Visit | Attending: Vascular Surgery | Admitting: Vascular Surgery

## 2014-09-25 ENCOUNTER — Encounter: Payer: Self-pay | Admitting: Vascular Surgery

## 2014-09-25 VITALS — BP 135/71 | HR 92 | Temp 98.2°F | Resp 16 | Ht 63.0 in | Wt 182.0 lb

## 2014-09-25 DIAGNOSIS — I6523 Occlusion and stenosis of bilateral carotid arteries: Secondary | ICD-10-CM | POA: Diagnosis present

## 2014-09-25 DIAGNOSIS — Z48812 Encounter for surgical aftercare following surgery on the circulatory system: Secondary | ICD-10-CM

## 2014-09-25 NOTE — Progress Notes (Signed)
Vascular and Vein Specialist of [facility]  Patient name: Sarah Alvarez MRN: [id number] DOB: [birth year] Sex: female  REASON FOR VISIT: follow up of bilateral carotid disease  HPI: Sarah Alvarez is a 64 y.o. female who underwent retrograde left common carotid artery stenting by Dr. Juanda Crumble fields on 03/05/2014 for a high-grade left common carotid artery stenosis. She also has undergone right carotid endarterectomy on 07/19/2013. She comes in for a 6 month follow up visit. Since I saw her last, she denies any history of stroke, TIAs, expressive or receptive aphasia, or amaurosis fugax. She is on aspirin. She is on a statin. She is on Plavix.    Past Medical History  Diagnosis Date  . Staph infection   . Restless leg syndrome   . Chronic mesenteric ischemia     s/p SMA stent 06/22/13  . Coronary artery disease   . Hypertension   . Hyperlipidemia   . Diabetes mellitus     Type  II   Family History  Problem Relation Age of Onset  . Diabetes Mother   . Stroke Mother   . Hyperlipidemia Mother   . Cancer Father   . Heart disease Father     PVD and  CAROTID  . Heart attack Father   . Diabetes Sister   . Hypertension Sister   . Heart disease Sister     Before age 58  . Hyperlipidemia Sister   . Varicose Veins Sister   . Cancer Brother   . Cancer Maternal Aunt     breast cancer   . Diabetes Maternal Grandmother   . Diabetes Paternal Grandmother    SOCIAL HISTORY: History  Substance Use Topics  . Smoking status: Former Smoker -- 1.00 packs/day for 20 years    Quit date: 06/21/2013  . Smokeless tobacco: Never Used  . Alcohol Use: No   No Known Allergies Current Outpatient Prescriptions  Medication Sig Dispense Refill  . acetaminophen (TYLENOL) 650 MG CR tablet Take 650 mg by mouth every 8 (eight) hours as needed for pain.    Marland Kitchen aspirin 325 MG tablet Take 1 tablet (325 mg total) by mouth daily.    Marland Kitchen atorvastatin (LIPITOR) 20 MG tablet Take 1 tablet (20 mg total) by mouth  daily. 30 tablet 5  . benazepril (LOTENSIN) 5 MG tablet TAKE 2 TABLETS BY MOUTH EVERY MORNING. 60 tablet 3  . clopidogrel (PLAVIX) 75 MG tablet Take 1 tablet (75 mg total) by mouth daily with breakfast. 30 tablet 3  . cyclobenzaprine (FLEXERIL) 5 MG tablet Take 1 tablet (5 mg total) by mouth at bedtime. 30 tablet 1  . diclofenac sodium (VOLTAREN) 1 % GEL APPLY 4 GRAMS TOPICALLY 4 TIMES DAILY. 100 g 2  . famotidine (PEPCID) 20 MG tablet TAKE 1 TABLET BY MOUTH 2 TIMES DAILY. 60 tablet 3  . gabapentin (NEURONTIN) 300 MG capsule Take  2 time a day . 60 capsule 3  . glipiZIDE (GLUCOTROL) 10 MG tablet TAKE 1 TABLET BY MOUTH 2 TIMES DAILY BEFORE A MEAL. 60 tablet 3  . glucose blood (ACCU-CHEK AVIVA) test strip Use as instructed 100 each 12  . insulin glargine (LANTUS) 100 UNIT/ML injection Inject 0.65 mLs (65 Units total) into the skin at bedtime. 30 mL 3  . Lancets (ACCU-CHEK MULTICLIX) lancets Use as instructed 100 each 12  . loratadine (CLARITIN) 10 MG tablet Take 1 tablet (10 mg total) by mouth daily as needed for allergies or rhinitis. 30 tablet 11  . metFORMIN (  GLUCOPHAGE) 1000 MG tablet TAKE 1 TABLET BY MOUTH 2 TIMES DAILY WITH A MEAL. 60 tablet 3  . metFORMIN (GLUMETZA) 500 MG (MOD) 24 hr tablet Take 1 tablet (500 mg total) by mouth 2 (two) times daily with a meal. (Patient not taking: Reported on 07/23/2014) 60 tablet 2  . oxymetazoline (AFRIN NASAL SPRAY) 0.05 % nasal spray Place 1 spray into both nostrils 2 (two) times daily as needed for congestion. 30 mL 0  . Vitamin D, Ergocalciferol, (DRISDOL) 50000 UNITS CAPS capsule Take 50,000 Units by mouth every 7 (seven) days. sunday    . [DISCONTINUED] colchicine 0.6 MG tablet Take two tab PO, then take 1 tab PO 1 hour later 6 tablet 0   No current facility-administered medications for this visit.   REVIEW OF SYSTEMS: Valu.Nieves ] denotes positive finding; [  ] denotes negative finding  CARDIOVASCULAR:  Valu.Nieves ] chest pain   [ ]  chest pressure   [ ]   palpitations   [ ]  orthopnea   [ ]  dyspnea on exertion   Valu.Nieves ] claudication   [ ]  rest pain   [ ]  DVT   [ ]  phlebitis PULMONARY:   Valu.Nieves ] productive cough   [ ]  asthma   [ ]  wheezing NEUROLOGIC:   [ ]  weakness  [ ]  paresthesias  [ ]  aphasia  [ ]  amaurosis  [ ]  dizziness HEMATOLOGIC:   [ ]  bleeding problems   [ ]  clotting disorders MUSCULOSKELETAL:  [ ]  joint pain   [ ]  joint swelling Valu.Nieves ] leg swelling GASTROINTESTINAL: [ ]   blood in stool  [ ]   hematemesis GENITOURINARY:  [ ]   dysuria  [ ]   hematuria PSYCHIATRIC:  [ ]  history of major depression INTEGUMENTARY:  [ ]  rashes  [ ]  ulcers CONSTITUTIONAL:  [ ]  fever   [ ]  chills  PHYSICAL EXAM: Filed Vitals:   09/25/14 1217 09/25/14 1222  BP: 133/97 135/71  Pulse: 94 92  Temp: 98.2 F (36.8 C)   TempSrc: Oral   Resp: 16   Height: 5\' 3"  (1.6 m)   Weight: 182 lb (82.555 kg)   SpO2: 97%    Body mass index is 32.25 kg/(m^2). GENERAL: The patient is a well-nourished female, in no acute distress. The vital signs are documented above. CARDIOVASCULAR: There is a regular rate and rhythm. She has bilateral carotid bruits. PULMONARY: There is good air exchange bilaterally without wheezing or rales. ABDOMEN: Soft and non-tender with normal pitched bowel sounds.  MUSCULOSKELETAL: There are no major deformities or cyanosis. NEUROLOGIC: No focal weakness or paresthesias are detected. SKIN: There are no ulcers or rashes noted. PSYCHIATRIC: The patient has a normal affect.  DATA:  I have independently interpreted her carotid duplex scan. Her right carotid endarterectomy site is widely patent with some mildly elevated velocities in the mid to distal internal carotid artery suggesting a 40-59% stenosis.there is a 60-79% left internal carotid artery stenosis. It is difficult to visualize the proximal common carotid artery stent.  MEDICAL ISSUES:  BILATERAL CAROTID DISEASE:The patient has a 60-79% left carotid stenosis. I have recommended a follow up  carotid duplex scan in 6 months. He has undergone a left carotid stent although this cannot be seen by duplex. I think the only way to really follow that would be cerebral arteriography and I do not think that is indicated given that she is asymptomatic and given the small risk of stroke associated with this procedure. On the right side she has a  mild recurrent stenosis of 40-59% and at this point we'll continue with a 6 month duplex scan. She knows to continue her aspirin, Plavix, and statin. Fortunately she is not a smoker. See her back in 6 months or just call sooner she has problems.  Return in about 6 months (around 03/28/2015).   [facility] [place]: [phone]

## 2014-09-26 ENCOUNTER — Ambulatory Visit (HOSPITAL_BASED_OUTPATIENT_CLINIC_OR_DEPARTMENT_OTHER): Payer: Medicaid Other | Admitting: Radiology

## 2014-09-26 ENCOUNTER — Telehealth: Payer: Self-pay | Admitting: Internal Medicine

## 2014-09-26 ENCOUNTER — Ambulatory Visit (HOSPITAL_COMMUNITY): Payer: Medicaid Other | Attending: Internal Medicine | Admitting: Radiology

## 2014-09-26 ENCOUNTER — Other Ambulatory Visit: Payer: Self-pay | Admitting: *Deleted

## 2014-09-26 DIAGNOSIS — R0602 Shortness of breath: Secondary | ICD-10-CM | POA: Insufficient documentation

## 2014-09-26 DIAGNOSIS — I6523 Occlusion and stenosis of bilateral carotid arteries: Secondary | ICD-10-CM

## 2014-09-26 MED ORDER — AMINOPHYLLINE 25 MG/ML IV SOLN
75.0000 mg | Freq: Once | INTRAVENOUS | Status: AC
  Administered 2014-09-26: 75 mg via INTRAVENOUS

## 2014-09-26 MED ORDER — TECHNETIUM TC 99M SESTAMIBI GENERIC - CARDIOLITE
33.0000 | Freq: Once | INTRAVENOUS | Status: AC | PRN
  Administered 2014-09-26: 33 via INTRAVENOUS

## 2014-09-26 MED ORDER — REGADENOSON 0.4 MG/5ML IV SOLN
0.4000 mg | Freq: Once | INTRAVENOUS | Status: AC
  Administered 2014-09-26: 0.4 mg via INTRAVENOUS

## 2014-09-26 MED ORDER — TECHNETIUM TC 99M SESTAMIBI GENERIC - CARDIOLITE
11.0000 | Freq: Once | INTRAVENOUS | Status: AC | PRN
  Administered 2014-09-26: 11 via INTRAVENOUS

## 2014-09-26 NOTE — Telephone Encounter (Signed)
New Msg        Pt states she is returning call from today.  Please call back.

## 2014-09-26 NOTE — Progress Notes (Signed)
Echocardiogram performed.  

## 2014-09-26 NOTE — Progress Notes (Signed)
[facility] [address] [place], [place] [address] [phone]    Cardiology Nuclear Med Study  Sarah Alvarez is a 64 y.o. female     MRN : [id number]     DOB: [birth year]  Procedure Date: 09/26/2014  Nuclear Med Background Indication for Stress Test:  Evaluation for Ischemia and Follow up CAD History:  CAD Cardiac Risk Factors: Carotid Disease, Hypertension and IDDM Type 2  Symptoms:  Chest Pain and DOE   Nuclear Pre-Procedure Caffeine/Decaff Intake:  None NPO After: 7:00pm   Lungs:  clear O2 Sat: 97% on room air. IV 0.9% NS with Angio Cath:  22g  IV Site: L Hand  IV Started by:  Crissie Figures, RN  Chest Size (in):  34 Cup Size: C  Height: 5\' 2"  (1.575 m)  Weight:  181 lb (82.101 kg)  BMI:  Body mass index is 33.1 kg/(m^2). Tech Comments:  Aminophylline 75 mg IV given for symptoms. All were resolved before leaving. 250cc NS IV given for hypotension.    Nuclear Med Study 1 or 2 day study: 1 day  Stress Test Type:  Lexiscan  Reading MD: N/A  Order Authorizing Provider:  Dorris Carnes, MD  Resting Radionuclide: Technetium 32m Sestamibi  Resting Radionuclide Dose: 11.0 mCi   Stress Radionuclide:  Technetium 74m Sestamibi  Stress Radionuclide Dose: 33.0 mCi           Stress Protocol Rest HR: 85 Stress HR: 93  Rest BP: 93/66 Stress BP: 113/89  Exercise Time (min): n/a METS: n/a   Predicted Max HR: 157 bpm % Max HR: 59.24 bpm Rate Pressure Product: 10509   Dose of Adenosine (mg):  n/a Dose of Lexiscan: 0.4 mg  Dose of Atropine (mg): n/a Dose of Dobutamine: n/a mcg/kg/min (at max HR)  Stress Test Technologist: Perrin Maltese, EMT-P  Nuclear Technologist:  Earl Many, CNMT     Rest Procedure:  Myocardial perfusion imaging was performed at rest 45 minutes following the intravenous administration of Technetium 40m Sestamibi. Rest ECG: NSR, cannot R/O prior septal MI, lateral TWI.  Stress Procedure:  The patient received IV Lexiscan 0.4  mg over 15-seconds.  Technetium 63m Sestamibi injected at 30-seconds. This patient had sob, felt weird, chest tightness, and a headache with the Lexiscan injection. Quantitative spect images were obtained after a 45 minute delay. Stress ECG: No significant ST segment change suggestive of ischemia.  QPS Raw Data Images:  Acquisition technically good; normal left ventricular size. Stress Images:  There is decreased uptake in the inferior wall. Rest Images:  Normal homogeneous uptake in all areas of the myocardium. Subtraction (SDS):  These findings are consistent with ischemia. Transient Ischemic Dilatation (Normal <1.22):  1.09 Lung/Heart Ratio (Normal <0.45):  0.34  Quantitative Gated Spect Images QGS EDV:  124 ml QGS ESV:  59 ml  Impression Exercise Capacity:  Lexiscan with no exercise. BP Response:  Hypotensive blood pressure response. Clinical Symptoms:  The patient complained of chest tightness. ECG Impression:  No significant ST segment change suggestive of ischemia. Comparison with Prior Nuclear Study: No images to compare  Overall Impression:  Low risk stress nuclear study with a small, severe, reversible inferior basal defect consistent with mild inferior ischemia.  LV Ejection Fraction: 53%.  LV Wall Motion:  NL LV Function; NL Wall Motion  Kirk Ruths

## 2014-10-01 ENCOUNTER — Encounter: Payer: Self-pay | Admitting: *Deleted

## 2014-10-01 ENCOUNTER — Other Ambulatory Visit: Payer: Self-pay | Admitting: *Deleted

## 2014-10-01 DIAGNOSIS — R079 Chest pain, unspecified: Secondary | ICD-10-CM

## 2014-10-01 DIAGNOSIS — R0789 Other chest pain: Principal | ICD-10-CM

## 2014-10-02 ENCOUNTER — Other Ambulatory Visit (INDEPENDENT_AMBULATORY_CARE_PROVIDER_SITE_OTHER): Payer: Medicaid Other | Admitting: *Deleted

## 2014-10-02 DIAGNOSIS — R0789 Other chest pain: Secondary | ICD-10-CM

## 2014-10-02 DIAGNOSIS — R079 Chest pain, unspecified: Secondary | ICD-10-CM

## 2014-10-02 LAB — PROTIME-INR
INR: 1 ratio (ref 0.8–1.0)
PROTHROMBIN TIME: 10.6 s (ref 9.6–13.1)

## 2014-10-02 LAB — BASIC METABOLIC PANEL
BUN: 19 mg/dL (ref 6–23)
CO2: 27 meq/L (ref 19–32)
Calcium: 8.9 mg/dL (ref 8.4–10.5)
Chloride: 104 mEq/L (ref 96–112)
Creatinine, Ser: 0.73 mg/dL (ref 0.40–1.20)
GFR: 85.41 mL/min (ref >60.00)
GLUCOSE: 340 mg/dL — AB (ref 70–99)
POTASSIUM: 4.3 meq/L (ref 3.5–5.1)
SODIUM: 136 meq/L (ref 135–145)

## 2014-10-02 LAB — CBC WITH DIFFERENTIAL/PLATELET
Basophils Absolute: 0 10*3/uL (ref 0.0–0.1)
Basophils Relative: 0.4 % (ref 0.0–3.0)
EOS PCT: 1.8 % (ref 0.0–5.0)
Eosinophils Absolute: 0.1 10*3/uL (ref 0.0–0.7)
HEMATOCRIT: 31.1 % — AB (ref 36.0–46.0)
HEMOGLOBIN: 10.4 g/dL — AB (ref 12.0–15.0)
LYMPHS ABS: 1.4 10*3/uL (ref 0.7–4.0)
Lymphocytes Relative: 18.8 % (ref 12.0–46.0)
MCHC: 33.4 g/dL (ref 30.0–36.0)
MCV: 93.4 fl (ref 78.0–100.0)
Monocytes Absolute: 0.3 10*3/uL (ref 0.1–1.0)
Monocytes Relative: 3.8 % (ref 3.0–12.0)
Neutro Abs: 5.6 10*3/uL (ref 1.4–7.7)
Neutrophils Relative %: 75.2 % (ref 43.0–77.0)
Platelets: 293 10*3/uL (ref 150.0–400.0)
RBC: 3.33 Mil/uL — ABNORMAL LOW (ref 3.87–5.11)
RDW: 14.4 % (ref 11.5–15.5)
WBC: 7.4 10*3/uL (ref 4.0–10.5)

## 2014-10-02 NOTE — Addendum Note (Signed)
Addended by: Eulis Foster on: 10/02/2014 08:47 AM   Modules accepted: Orders

## 2014-10-02 NOTE — Telephone Encounter (Signed)
Patient had been called re: lab results on Patient had echo and nuclear study, results have been reviewed.  Had labs today for cath set up for tomorrow.

## 2014-10-03 ENCOUNTER — Ambulatory Visit (HOSPITAL_COMMUNITY)
Admission: RE | Admit: 2014-10-03 | Discharge: 2014-10-03 | Disposition: A | Discharge status: Home / Self Care | Payer: Medicaid Other | Source: Ambulatory Visit | Attending: Cardiovascular Disease | Admitting: Cardiovascular Disease

## 2014-10-03 ENCOUNTER — Encounter (HOSPITAL_COMMUNITY)
Admission: RE | Disposition: A | Discharge status: Home / Self Care | Payer: Self-pay | Source: Ambulatory Visit | Attending: Cardiovascular Disease

## 2014-10-03 ENCOUNTER — Encounter (HOSPITAL_COMMUNITY): Payer: Self-pay | Admitting: Cardiovascular Disease

## 2014-10-03 DIAGNOSIS — Z9049 Acquired absence of other specified parts of digestive tract: Secondary | ICD-10-CM | POA: Diagnosis not present

## 2014-10-03 DIAGNOSIS — Z794 Long term (current) use of insulin: Secondary | ICD-10-CM | POA: Diagnosis not present

## 2014-10-03 DIAGNOSIS — Z955 Presence of coronary angioplasty implant and graft: Secondary | ICD-10-CM | POA: Diagnosis not present

## 2014-10-03 DIAGNOSIS — Z90710 Acquired absence of both cervix and uterus: Secondary | ICD-10-CM | POA: Diagnosis not present

## 2014-10-03 DIAGNOSIS — E119 Type 2 diabetes mellitus without complications: Secondary | ICD-10-CM | POA: Insufficient documentation

## 2014-10-03 DIAGNOSIS — Z7982 Long term (current) use of aspirin: Secondary | ICD-10-CM | POA: Insufficient documentation

## 2014-10-03 DIAGNOSIS — E785 Hyperlipidemia, unspecified: Secondary | ICD-10-CM | POA: Insufficient documentation

## 2014-10-03 DIAGNOSIS — I2510 Atherosclerotic heart disease of native coronary artery without angina pectoris: Secondary | ICD-10-CM | POA: Diagnosis not present

## 2014-10-03 DIAGNOSIS — R079 Chest pain, unspecified: Secondary | ICD-10-CM | POA: Diagnosis present

## 2014-10-03 DIAGNOSIS — Z87891 Personal history of nicotine dependence: Secondary | ICD-10-CM | POA: Insufficient documentation

## 2014-10-03 DIAGNOSIS — I10 Essential (primary) hypertension: Secondary | ICD-10-CM | POA: Diagnosis not present

## 2014-10-03 HISTORY — PX: LEFT HEART CATHETERIZATION WITH CORONARY ANGIOGRAM: SHX5451

## 2014-10-03 LAB — CK TOTAL AND CKMB (NOT AT ARMC)
CK, MB: 1.3 ng/mL (ref 0.3–4.0)
RELATIVE INDEX: INVALID (ref 0.0–2.5)
Total CK: 31 U/L (ref 7–177)

## 2014-10-03 LAB — POCT I-STAT 3, VENOUS BLOOD GAS (G3P V)
Acid-base deficit: 3 mmol/L — ABNORMAL HIGH (ref 0.0–2.0)
BICARBONATE: 21.9 meq/L (ref 20.0–24.0)
O2 Saturation: 59 %
PH VEN: 7.347 — AB (ref 7.250–7.300)
TCO2: 23 mmol/L (ref 0–100)
pCO2, Ven: 40 mmHg — ABNORMAL LOW (ref 45.0–50.0)
pO2, Ven: 32 mmHg (ref 30.0–45.0)

## 2014-10-03 LAB — POCT I-STAT 3, ART BLOOD GAS (G3+)
Acid-base deficit: 4 mmol/L — ABNORMAL HIGH (ref 0.0–2.0)
Bicarbonate: 21.2 mEq/L (ref 20.0–24.0)
O2 SAT: 94 %
TCO2: 22 mmol/L (ref 0–100)
pCO2 arterial: 35.7 mmHg (ref 35.0–45.0)
pH, Arterial: 7.381 (ref 7.350–7.450)
pO2, Arterial: 71 mmHg — ABNORMAL LOW (ref 80.0–100.0)

## 2014-10-03 LAB — GLUCOSE, CAPILLARY
GLUCOSE-CAPILLARY: 124 mg/dL — AB (ref 70–99)
Glucose-Capillary: 66 mg/dL — ABNORMAL LOW (ref 70–99)
Glucose-Capillary: 75 mg/dL (ref 70–99)
Glucose-Capillary: 138 mg/dL — ABNORMAL HIGH (ref 70–99)
Glucose-Capillary: 42 mg/dL — CL (ref 70–99)

## 2014-10-03 SURGERY — LEFT HEART CATHETERIZATION WITH CORONARY ANGIOGRAM

## 2014-10-03 MED ORDER — LIDOCAINE HCL (PF) 1 % IJ SOLN
INTRAMUSCULAR | Status: AC
  Filled 2014-10-03: qty 30

## 2014-10-03 MED ORDER — SODIUM CHLORIDE 0.9 % IV SOLN
INTRAVENOUS | Status: DC
  Administered 2014-10-03: 10:00:00 via INTRAVENOUS

## 2014-10-03 MED ORDER — OXYCODONE-ACETAMINOPHEN 5-325 MG PO TABS
ORAL_TABLET | ORAL | Status: AC
  Administered 2014-10-03: 2 via ORAL
  Filled 2014-10-03: qty 2

## 2014-10-03 MED ORDER — DEXTROSE 50 % IV SOLN
INTRAVENOUS | Status: AC
  Administered 2014-10-03: 50 mL
  Filled 2014-10-03: qty 50

## 2014-10-03 MED ORDER — HEPARIN (PORCINE) IN NACL 2-0.9 UNIT/ML-% IJ SOLN
INTRAMUSCULAR | Status: AC
  Filled 2014-10-03: qty 1000

## 2014-10-03 MED ORDER — MIDAZOLAM HCL 2 MG/2ML IJ SOLN
INTRAMUSCULAR | Status: AC
  Filled 2014-10-03: qty 2

## 2014-10-03 MED ORDER — HEPARIN (PORCINE) IN NACL 2-0.9 UNIT/ML-% IJ SOLN
INTRAMUSCULAR | Status: AC
  Filled 2014-10-03: qty 1500

## 2014-10-03 MED ORDER — NITROGLYCERIN IN D5W 200-5 MCG/ML-% IV SOLN
INTRAVENOUS | Status: AC
  Filled 2014-10-03: qty 250

## 2014-10-03 MED ORDER — NITROGLYCERIN 1 MG/10 ML FOR IR/CATH LAB
INTRA_ARTERIAL | Status: AC
  Filled 2014-10-03: qty 10

## 2014-10-03 MED ORDER — SODIUM CHLORIDE 0.9 % IJ SOLN: 3.0000 mL | INTRAMUSCULAR | Status: DC | PRN

## 2014-10-03 MED ORDER — DEXTROSE 250 MG/ML IV SOLN: 0.5000 g/kg | Freq: Once | INTRAVENOUS | Status: DC

## 2014-10-03 MED ORDER — SODIUM CHLORIDE 0.9 % IJ SOLN: 3.0000 mL | Freq: Two times a day (BID) | INTRAMUSCULAR | Status: DC

## 2014-10-03 MED ORDER — LIDOCAINE HCL (PF) 1 % IJ SOLN
INTRAMUSCULAR | Status: AC
Start: 2014-10-03 — End: 2014-10-03
  Filled 2014-10-03: qty 30

## 2014-10-03 MED ORDER — DEXTROSE 50 % IV SOLN
25.0000 mL | Freq: Once | INTRAVENOUS | Status: AC
  Administered 2014-10-03: 25 mL via INTRAVENOUS

## 2014-10-03 MED ORDER — MORPHINE SULFATE 2 MG/ML IJ SOLN: 2.0000 mg | INTRAMUSCULAR | Status: DC | PRN

## 2014-10-03 MED ORDER — SODIUM CHLORIDE 0.9 % IV SOLN: INTRAVENOUS | Status: AC

## 2014-10-03 MED ORDER — OXYCODONE-ACETAMINOPHEN 5-325 MG PO TABS
1.0000 | ORAL_TABLET | ORAL | Status: DC | PRN
  Administered 2014-10-03: 2 via ORAL

## 2014-10-03 MED ORDER — SODIUM CHLORIDE 0.9 % IV SOLN: 250.0000 mL | INTRAVENOUS | Status: DC | PRN

## 2014-10-03 MED ORDER — FENTANYL CITRATE 0.05 MG/ML IJ SOLN
INTRAMUSCULAR | Status: AC
  Filled 2014-10-03: qty 2

## 2014-10-03 MED ORDER — ASPIRIN 81 MG PO CHEW: 81.0000 mg | CHEWABLE_TABLET | ORAL | Status: DC

## 2014-10-03 NOTE — H&P (View-Only) (Signed)
Cardiology Office Note   Date:  09/16/2014   ID:  [name], DOB [birth year], MRN [id number]  PCP:  Lorayne Marek, MD  Cardiologist:   Dorris Carnes, MD   Patient presents for continued f/u of chest pain     History of Present Illness: Sarah Alvarez is a 64 y.o. female with a history of CAD Stents done in [facility] and [facility] ([place], [place])  Lives in [place]   Last seen a long time ago.  Her anginal equivalent appears to be numbness in L arm  She hs not had any numbness recently   Gets occasional SOB  With activity Has noticed she has some PND  New  1 to 2x per wk     Gets swelling in L leg  Has DM for over 15 years  Sugars 70s to 250  Last A1c over 9    Current Outpatient Prescriptions  Medication Sig Dispense Refill  . acetaminophen (TYLENOL) 650 MG CR tablet Take 650 mg by mouth every 8 (eight) hours as needed for pain.    Marland Kitchen aspirin 325 MG tablet Take 1 tablet (325 mg total) by mouth daily.    Marland Kitchen atorvastatin (LIPITOR) 20 MG tablet Take 1 tablet (20 mg total) by mouth daily. 30 tablet 5  . benazepril (LOTENSIN) 5 MG tablet TAKE 2 TABLETS BY MOUTH EVERY MORNING. 60 tablet 3  . clopidogrel (PLAVIX) 75 MG tablet Take 1 tablet (75 mg total) by mouth daily with breakfast. 30 tablet 3  . cyclobenzaprine (FLEXERIL) 5 MG tablet Take 1 tablet (5 mg total) by mouth at bedtime. 30 tablet 1  . diclofenac sodium (VOLTAREN) 1 % GEL APPLY 4 GRAMS TOPICALLY 4 TIMES DAILY. 100 g 2  . famotidine (PEPCID) 20 MG tablet TAKE 1 TABLET BY MOUTH 2 TIMES DAILY. 60 tablet 3  . gabapentin (NEURONTIN) 300 MG capsule Take  2 time a day . 60 capsule 3  . glipiZIDE (GLUCOTROL) 10 MG tablet TAKE 1 TABLET BY MOUTH 2 TIMES DAILY BEFORE A MEAL. 60 tablet 3  . glucose blood (ACCU-CHEK AVIVA) test strip Use as instructed 100 each 12  . insulin glargine (LANTUS) 100 UNIT/ML injection Inject 0.65 mLs (65 Units total) into the skin at bedtime. 30 mL 3  . Lancets (ACCU-CHEK MULTICLIX) lancets Use as  instructed 100 each 12  . loratadine (CLARITIN) 10 MG tablet Take 1 tablet (10 mg total) by mouth daily as needed for allergies or rhinitis. 30 tablet 11  . metFORMIN (GLUCOPHAGE) 1000 MG tablet TAKE 1 TABLET BY MOUTH 2 TIMES DAILY WITH A MEAL. 60 tablet 3  . metFORMIN (GLUMETZA) 500 MG (MOD) 24 hr tablet Take 1 tablet (500 mg total) by mouth 2 (two) times daily with a meal. (Patient not taking: Reported on 07/23/2014) 60 tablet 2  . oxymetazoline (AFRIN NASAL SPRAY) 0.05 % nasal spray Place 1 spray into both nostrils 2 (two) times daily as needed for congestion. 30 mL 0  . Vitamin D, Ergocalciferol, (DRISDOL) 50000 UNITS CAPS capsule Take 50,000 Units by mouth every 7 (seven) days. sunday    . [DISCONTINUED] colchicine 0.6 MG tablet Take two tab PO, then take 1 tab PO 1 hour later 6 tablet 0   No current facility-administered medications for this visit.    Allergies:   Review of patient's allergies indicates no known allergies.   Past Medical History  Diagnosis Date  . Staph infection   . Restless leg syndrome   . Chronic mesenteric  ischemia     s/p SMA stent 06/22/13  . Coronary artery disease   . Hypertension   . Hyperlipidemia   . Diabetes mellitus     Type  II    Past Surgical History  Procedure Laterality Date  . Cholecystectomy    . Abdominal hysterectomy    . Coronary angioplasty      ? LCX stent ~ 2000 in [place] with LCX stent [facility] 09/1999 for reported re-instent stenosis  . Endarterectomy Right 07/19/2013    Procedure: ENDARTERECTOMY CAROTID-RIGHT;  Surgeon: Angelia Mould, MD;  Location: [facility];  Service: Vascular;  Laterality: Right;  . Carotid endarterectomy Right 07-19-13  . Insertion of retrograde carotid stent Left 03/05/2014    Procedure: 1)Cutdown of open exposure left common carotid; 2) Retrograde left common carotid stent; 3) Repair of left common carotid artery.;  Surgeon: Elam Dutch, MD;  Location: [facility];  Service: Vascular;  Laterality: Left;  .  Arch aortogram N/A 02/11/2014    Procedure: ARCH AORTOGRAM;  Surgeon: Angelia Mould, MD;  Location: [facility] CATH LAB;  Service: Cardiovascular;  Laterality: N/A;     Social History:  The patient  reports that she quit smoking about 14 months ago. She has never used smokeless tobacco. She reports that she does not drink alcohol or use illicit drugs.   Family History:  The patient's family history includes Cancer in her brother, father, and maternal aunt; Diabetes in her maternal grandmother, mother, paternal grandmother, and sister; Heart attack in her father; Heart disease in her father and sister; Hyperlipidemia in her mother and sister; Hypertension in her sister; Stroke in her mother; Varicose Veins in her sister.    ROS:  Please see the history of present illness. All other systems are reviewed and  Negative to the above problem except as noted.    PHYSICAL EXAM: VS:  BP 150/74 mmHg  Pulse 100  Ht 5\' 2"  (1.575 m)  Wt 182 lb 12.8 oz (82.918 kg)  BMI 33.43 kg/m2  GEN: Well nourished, well developed, in no acute distress HEENT: normal Neck: no JVD, Bruits bilatearl , or masses Cardiac: RRR; II/VI systolic murmur LSBb, rubs, or gallops,1-2+  edema (L gr R) Respiratory:  clear to auscultation bilaterally, normal work of breathing GI: soft, nontender, nondistended, + BS  No hepatomegaly  MS: no deformity Moving all extremities   Skin: warm and dry, no rash Neuro:  Strength and sensation are intact Psych: euthymic mood, full affect   EKG:  EKG is ordered today.  SR 100 bpm  Septal MI     Lipid Panel    Component Value Date/Time   CHOL 124 08/13/2013 0856   TRIG 105 08/13/2013 0856   HDL 38* 08/13/2013 0856   CHOLHDL 3.3 08/13/2013 0856   VLDL 21 08/13/2013 0856   LDLCALC 65 08/13/2013 0856      Wt Readings from Last 3 Encounters:  09/16/14 182 lb 12.8 oz (82.918 kg)  08/13/14 168 lb 12.8 oz (76.567 kg)  07/23/14 160 lb (72.576 kg)      ASSESSMENT AND PLAN:  1.   SOB  Need to get CBC, TSH, BNP  Would also set up for lexiscan myoview and echo  Need to get records from Dr Clayborn Bigness Patinet with peripheral edema on exam   2.  CAD As noted above  3  HL  Check lipids    Signed, Dorris Carnes, MD  09/16/2014 12:19 PM    [facility]  [address], [place], [place]  [address] Phone: [phone]; Fax: [phone]    Addendum  (10/01/14):  Patinet had echo that showed normal LVEF  Increased filling pressures Myoview shows small are of severe ischemia inferiorly  .  I have reviewed  It appears signif I would recomm R and L heart cath to define, given that patient has had progressive SOB Patient understands risks  Agrees to proceed  Dorris Carnes

## 2014-10-03 NOTE — CV Procedure (Signed)
      Cardiac Catheterization Operative Report  Sarah Alvarez [id number] 3/24/20162:38 PM Lorayne Marek, MD  Procedure Performed:  1. Left Heart Catheterization 2. Selective Coronary Angiography 3. Right Heart Catheterization  Operator: Lauree Chandler, MD  Indication: 63 yo female with history of CAD s/p prior PCI/stents, DM, HTN, HLD, former tobacco abuse with recent dyspnea. Her complaints of dyspnea led to a stress myoview which was read as low risk with possible ischemia in the base of the inferior wall.                                  Procedure Details: The risks, benefits, complications, treatment options, and expected outcomes were discussed with the patient. The patient and/or family concurred with the proposed plan, giving informed consent. The patient was brought to the cath lab after IV hydration was begun and oral premedication was given. The patient was further sedated with Versed and Fentanyl. The right groin was prepped and draped in the usual manner. Using the modified Seldinger access technique, a 5 French sheath was placed in the right femoral artery. A 6 French sheath was inserted into the right femoral vein. A balloon tipped catheter was used to perform a right heart catheterization. Standard diagnostic catheters were used to perform selective coronary angiography. The aortic valve was crossed with the JR4 catheter and LV pressures were measured. There were no immediate complications. The patient was taken to the recovery area in stable condition.   Hemodynamic Findings: Ao:   137/47         LV: 134/3/9 RA: 1            RV: 25/0/1 PA: 27/6 (mean 16) PCWP:  17 Fick Cardiac Output: 5 L/min Fick Cardiac Index: 2.69 L/min/m2 Central Aortic Saturation: 94% Pulmonary Artery Saturation: 59%  Angiographic Findings:  Left main: No obstructive disease  Left Anterior Descending Artery: Large caliber vessel that courses to the apex. The mid vessel has 40%  stenosis. There are two moderate caliber diagonal branches. The first diagonal branch is a moderate caliber vessel with proximal 20% stenosis. The second diagonal branch is a small caliber vessel with ostial 40% stenosis.   Circumflex Artery: Moderate caliber vessel with two obtuse marginal branches. The first obtuse marginal branch is a small to moderate caliber vessel with mild plaque. The second obtuse marginal branch is a moderate caliber vessel. There is a patent stented segment in the mid Circumflex extending into the second OM branch. There is diffuse 30% restenosis within the stented segment. This does not appear to be flow limiting.   Right Coronary Artery: Large dominant vessel with calcification noted throughout the vessel. The proximal vessel has diffuse 30% stenosis. The mid vessel has diffuse 30% stenosis. The distal vessel has diffuse 30% stenosis. The moderate caliber posterolateral branch has proximal 40% stenosis. The moderate caliber PDA has diffuse 30% stenosis. There is a very small sub-branch off of the PLA that has diffuse 60% stenosis.   Left Ventricular Angiogram: Deferred.   Impression: 1. Patent stent mid Circumflex with mild restenosis 2. Diffuse non-obstructive disease in the RCA and LAD  Recommendations: Continue medical management of CAD       Complications:  None; patient tolerated the procedure well.

## 2014-10-03 NOTE — Progress Notes (Signed)
Drank 8oz OJ. Will notify Dr. Angelena Form and recheck CBG in 15 minutes.

## 2014-10-03 NOTE — Progress Notes (Signed)
Site area: rt groin sheaths pulled by [employer] Prior to Removal:  Level  0 Pressure Applied For:  20 minutes Manual:   yes Patient Status During Pull:  stable Post Pull Site:  Level  0  Post Pull Instructions Given:  yes Post Pull Pulses Present:  yes Dressing Applied:  yes Bedrest begins @ 6283 Comments:  0 complications

## 2014-10-03 NOTE — Progress Notes (Signed)
Pts blood sugar was rechecked it was 66.  Pts is shakey, sweaty and has a headache.  I pushed 25 of Dextrose 50.  Will recheck in 15 minutes.

## 2014-10-03 NOTE — Progress Notes (Signed)
Informed Rennis Harding RN of pt's CBG of 66 who is informing Dr. Angelena Form. Hypoglycemic protocol initiated.

## 2014-10-03 NOTE — Discharge Instructions (Signed)

## 2014-10-03 NOTE — Interval H&P Note (Signed)
History and Physical Interval Note:  10/03/2014 1:43 PM  Monico Blitz  has presented today for cardiac cath with the diagnosis of CAD, unstable angina, abnormal stress test. The various methods of treatment have been discussed with the patient and family. After consideration of risks, benefits and other options for treatment, the patient has consented to  Procedure(s): LEFT HEART CATHETERIZATION WITH CORONARY ANGIOGRAM (N/A) as a surgical intervention .  The patient's history has been reviewed, patient examined, no change in status, stable for surgery.  I have reviewed the patient's chart and labs.  Questions were answered to the patient's satisfaction.   Cath Lab Visit (complete for each Cath Lab visit)  Clinical Evaluation Leading to the Procedure:   ACS: No.  Non-ACS:    Anginal Classification: CCS III  Anti-ischemic medical therapy: No Therapy  Non-Invasive Test Results: Low-risk stress test findings: cardiac mortality <1%/year  Prior CABG: No previous CABG          MCALHANY,CHRISTOPHER

## 2014-10-29 ENCOUNTER — Encounter: Payer: Self-pay | Admitting: Internal Medicine

## 2014-10-29 ENCOUNTER — Ambulatory Visit: Payer: Medicaid Other | Attending: Internal Medicine | Admitting: Internal Medicine

## 2014-10-29 VITALS — BP 120/60 | HR 102 | Temp 98.2°F | Resp 16 | Wt 184.2 lb

## 2014-10-29 DIAGNOSIS — Z90710 Acquired absence of both cervix and uterus: Secondary | ICD-10-CM | POA: Diagnosis not present

## 2014-10-29 DIAGNOSIS — G2581 Restless legs syndrome: Secondary | ICD-10-CM | POA: Diagnosis not present

## 2014-10-29 DIAGNOSIS — Z794 Long term (current) use of insulin: Secondary | ICD-10-CM | POA: Diagnosis not present

## 2014-10-29 DIAGNOSIS — Z7902 Long term (current) use of antithrombotics/antiplatelets: Secondary | ICD-10-CM | POA: Insufficient documentation

## 2014-10-29 DIAGNOSIS — E785 Hyperlipidemia, unspecified: Secondary | ICD-10-CM | POA: Insufficient documentation

## 2014-10-29 DIAGNOSIS — Z7982 Long term (current) use of aspirin: Secondary | ICD-10-CM | POA: Insufficient documentation

## 2014-10-29 DIAGNOSIS — Z87891 Personal history of nicotine dependence: Secondary | ICD-10-CM | POA: Diagnosis not present

## 2014-10-29 DIAGNOSIS — R208 Other disturbances of skin sensation: Secondary | ICD-10-CM

## 2014-10-29 DIAGNOSIS — Z9861 Coronary angioplasty status: Secondary | ICD-10-CM | POA: Diagnosis not present

## 2014-10-29 DIAGNOSIS — E119 Type 2 diabetes mellitus without complications: Secondary | ICD-10-CM | POA: Insufficient documentation

## 2014-10-29 DIAGNOSIS — R202 Paresthesia of skin: Secondary | ICD-10-CM | POA: Insufficient documentation

## 2014-10-29 DIAGNOSIS — E139 Other specified diabetes mellitus without complications: Secondary | ICD-10-CM

## 2014-10-29 DIAGNOSIS — R200 Anesthesia of skin: Secondary | ICD-10-CM

## 2014-10-29 DIAGNOSIS — Z9049 Acquired absence of other specified parts of digestive tract: Secondary | ICD-10-CM | POA: Diagnosis not present

## 2014-10-29 DIAGNOSIS — I2510 Atherosclerotic heart disease of native coronary artery without angina pectoris: Secondary | ICD-10-CM | POA: Diagnosis not present

## 2014-10-29 DIAGNOSIS — I10 Essential (primary) hypertension: Secondary | ICD-10-CM | POA: Insufficient documentation

## 2014-10-29 LAB — GLUCOSE, POCT (MANUAL RESULT ENTRY): POC Glucose: 211 mg/dl — AB (ref 70–99)

## 2014-10-29 MED ORDER — GABAPENTIN 300 MG PO CAPS: 300.0000 mg | ORAL_CAPSULE | Freq: Three times a day (TID) | ORAL | Status: DC

## 2014-10-29 NOTE — Progress Notes (Signed)
Patient complains of bilateral foot pain with the left foot being the worse of her feet Patient is having throbbing and tingling At night her feet stiffen up making it hard for her to sleep Patient states the pain has gotten worse in the past two weeks

## 2014-10-29 NOTE — Progress Notes (Signed)
MRN: [id number] Name: Sarah Alvarez  Sex: female Age: 64 y.o. DOB: [birth year]  Allergies: Review of patient's allergies indicates no known allergies.  Chief Complaint  Patient presents with  . Foot Pain    HPI: Patient is 64 y.o. female who has history of diabetes hypertension hyperlipidemia comes today complaining of numbness tingling in both feet recently worse for the last few days, as per patient she is taking Neurontin 300 mg twice a day, also previously her diabetes has been uncontrolled but recently has been compliant with her medications and as per patient her fasting sugar is usually between 100 to 200 mg/dL, patient denies any recent fall or trauma.  Past Medical History  Diagnosis Date  . Staph infection   . Restless leg syndrome   . Chronic mesenteric ischemia     s/p SMA stent 06/22/13  . Coronary artery disease   . Hypertension   . Hyperlipidemia   . Diabetes mellitus     Type  II    Past Surgical History  Procedure Laterality Date  . Cholecystectomy    . Abdominal hysterectomy    . Coronary angioplasty      ? LCX stent ~ 2000 in [place], [place] with LCX stent [facility] 09/1999 for reported re-instent stenosis  . Endarterectomy Right 07/19/2013    Procedure: ENDARTERECTOMY CAROTID-RIGHT;  Surgeon: Angelia Mould, MD;  Location: [place];  Service: Vascular;  Laterality: Right;  . Carotid endarterectomy Right 07-19-13  . Insertion of retrograde carotid stent Left 03/05/2014    Procedure: 1)Cutdown of open exposure left common carotid; 2) Retrograde left common carotid stent; 3) Repair of left common carotid artery.;  Surgeon: Elam Dutch, MD;  Location: [place];  Service: Vascular;  Laterality: Left;  . Arch aortogram N/A 02/11/2014    Procedure: ARCH AORTOGRAM;  Surgeon: Angelia Mould, MD;  Location: [facility] CATH LAB;  Service: Cardiovascular;  Laterality: N/A;  . Left heart catheterization with coronary angiogram N/A 10/03/2014    Procedure: LEFT HEART  CATHETERIZATION WITH CORONARY ANGIOGRAM;  Surgeon: Burnell Blanks, MD;  Location: [facility] CATH LAB;  Service: Cardiovascular;  Laterality: N/A;      Medication List       This list is accurate as of: 10/29/14 12:35 PM.  Always use your most recent med list.               accu-chek multiclix lancets  Use as instructed     acetaminophen 650 MG CR tablet  Commonly known as:  TYLENOL  Take 650 mg by mouth every 8 (eight) hours as needed for pain.     aspirin 325 MG tablet  Take 1 tablet (325 mg total) by mouth daily.     atorvastatin 20 MG tablet  Commonly known as:  LIPITOR  Take 1 tablet (20 mg total) by mouth daily.     benazepril 5 MG tablet  Commonly known as:  LOTENSIN  TAKE 2 TABLETS BY MOUTH EVERY MORNING.     clopidogrel 75 MG tablet  Commonly known as:  PLAVIX  Take 1 tablet (75 mg total) by mouth daily with breakfast.     cyclobenzaprine 5 MG tablet  Commonly known as:  FLEXERIL  Take 1 tablet (5 mg total) by mouth at bedtime.     diclofenac sodium 1 % Gel  Commonly known as:  VOLTAREN  APPLY 4 GRAMS TOPICALLY 4 TIMES DAILY.     famotidine 20 MG tablet  Commonly known as:  PEPCID  TAKE 1 TABLET BY MOUTH 2 TIMES DAILY.     gabapentin 300 MG capsule  Commonly known as:  NEURONTIN  Take 1 capsule (300 mg total) by mouth 3 (three) times daily.     glipiZIDE 10 MG tablet  Commonly known as:  GLUCOTROL  TAKE 1 TABLET BY MOUTH 2 TIMES DAILY BEFORE A MEAL.     glucose blood test strip  Commonly known as:  ACCU-CHEK AVIVA  Use as instructed     insulin glargine 100 UNIT/ML injection  Commonly known as:  LANTUS  Inject 0.65 mLs (65 Units total) into the skin at bedtime.     loratadine 10 MG tablet  Commonly known as:  CLARITIN  Take 1 tablet (10 mg total) by mouth daily as needed for allergies or rhinitis.     metFORMIN 500 MG (MOD) 24 hr tablet  Commonly known as:  GLUMETZA  Take 1 tablet (500 mg total) by mouth 2 (two) times daily with a meal.       metFORMIN 1000 MG tablet  Commonly known as:  GLUCOPHAGE  TAKE 1 TABLET BY MOUTH 2 TIMES DAILY WITH A MEAL.     oxymetazoline 0.05 % nasal spray  Commonly known as:  AFRIN NASAL SPRAY  Place 1 spray into both nostrils 2 (two) times daily as needed for congestion.     Vitamin D (Ergocalciferol) 50000 UNITS Caps capsule  Commonly known as:  DRISDOL  Take 50,000 Units by mouth every 7 (seven) days. sunday        Meds ordered this encounter  Medications  . DISCONTD: gabapentin (NEURONTIN) 300 MG capsule    Sig: Take 1 capsule (300 mg total) by mouth 3 (three) times daily.    Dispense:  60 capsule    Refill:  3  . gabapentin (NEURONTIN) 300 MG capsule    Sig: Take 1 capsule (300 mg total) by mouth 3 (three) times daily.    Dispense:  90 capsule    Refill:  3    Immunization History  Administered Date(s) Administered  . Influenza Split 06/05/2012  . Influenza,inj,Quad PF,36+ Mos 06/12/2013, 06/21/2013  . Pneumococcal Polysaccharide-23 06/21/2013    Family History  Problem Relation Age of Onset  . Diabetes Mother   . Stroke Mother   . Hyperlipidemia Mother   . Cancer Father   . Heart disease Father     PVD and  CAROTID  . Heart attack Father   . Diabetes Sister   . Hypertension Sister   . Heart disease Sister     Before age 87  . Hyperlipidemia Sister   . Varicose Veins Sister   . Cancer Brother   . Cancer Maternal Aunt     breast cancer   . Diabetes Maternal Grandmother   . Diabetes Paternal Grandmother     History  Substance Use Topics  . Smoking status: Former Smoker -- 1.00 packs/day for 20 years    Quit date: 06/21/2013  . Smokeless tobacco: Never Used  . Alcohol Use: No    Review of Systems   As noted in HPI  Filed Vitals:   10/29/14 1224  BP: 120/60  Pulse:   Temp:   Resp:     Physical Exam  Physical Exam  Constitutional: No distress.  Eyes: EOM are normal. Pupils are equal, round, and reactive to light.  Cardiovascular: Normal rate  and regular rhythm.   Pulmonary/Chest: Breath sounds normal. No respiratory distress. She has no wheezes. She has  no rales.  Musculoskeletal:  Bilateral feet impaired sensation, no ulcer or skin breakdown.    CBC    Component Value Date/Time   WBC 7.4 10/02/2014 0847   RBC 3.33* 10/02/2014 0847   HGB 10.4* 10/02/2014 0847   HCT 31.1* 10/02/2014 0847   PLT 293.0 10/02/2014 0847   MCV 93.4 10/02/2014 0847   LYMPHSABS 1.4 10/02/2014 0847   MONOABS 0.3 10/02/2014 0847   EOSABS 0.1 10/02/2014 0847   BASOSABS 0.0 10/02/2014 0847    CMP     Component Value Date/Time   NA 136 10/02/2014 0847   K 4.3 10/02/2014 0847   CL 104 10/02/2014 0847   CO2 27 10/02/2014 0847   GLUCOSE 340* 10/02/2014 0847   BUN 19 10/02/2014 0847   CREATININE 0.73 10/02/2014 0847   CREATININE 0.67 08/13/2014 1510   CALCIUM 8.9 10/02/2014 0847   PROT 6.1 08/13/2014 1510   ALBUMIN 3.9 08/13/2014 1510   AST 12 08/13/2014 1510   ALT 10 08/13/2014 1510   ALKPHOS 84 08/13/2014 1510   BILITOT 0.3 08/13/2014 1510   GFRNONAA >89 08/13/2014 1510   GFRNONAA >90 03/06/2014 0253   GFRAA >89 08/13/2014 1510   GFRAA >90 03/06/2014 0253    Lab Results  Component Value Date/Time   CHOL 166 09/16/2014 01:11 PM    Lab Results  Component Value Date/Time   HGBA1C 13.4* 08/13/2014 03:10 PM   HGBA1C >15.0 08/13/2014 02:22 PM    Lab Results  Component Value Date/Time   AST 12 08/13/2014 03:10 PM    Assessment and Plan  Other specified diabetes mellitus without complications - Plan:  Results for orders placed or performed in visit on 10/29/14  Glucose (CBG)  Result Value Ref Range   POC Glucose 211 (A) 70 - 99 mg/dl  'advise patient for compliance with her diabetes medications, diabetes meal planning, will check her A1c in 3 months time,Glucose (CBG), Ambulatory referral to Podiatry, gabapentin (NEURONTIN) 300 MG capsule, DISCONTINUED: gabapentin (NEURONTIN) 300 MG capsule  Numbness in feet - Plan:  Ambulatory referral to Podiatry, I have increased the dose of gabapentin (NEURONTIN) 300 MG capsule to 3 times a day.    Return in about 3 months (around 01/28/2015), or if symptoms worsen or fail to improve, for hypertension, diabetes.   This note has been created with Surveyor, quantity. Any transcriptional errors are unintentional.    Lorayne Marek, MD

## 2014-11-13 ENCOUNTER — Ambulatory Visit (INDEPENDENT_AMBULATORY_CARE_PROVIDER_SITE_OTHER): Payer: Medicaid Other | Admitting: Podiatry

## 2014-11-13 ENCOUNTER — Encounter: Payer: Self-pay | Admitting: Podiatry

## 2014-11-13 VITALS — BP 116/75 | HR 90 | Resp 18

## 2014-11-13 DIAGNOSIS — E1140 Type 2 diabetes mellitus with diabetic neuropathy, unspecified: Secondary | ICD-10-CM | POA: Diagnosis not present

## 2014-11-13 DIAGNOSIS — E1149 Type 2 diabetes mellitus with other diabetic neurological complication: Secondary | ICD-10-CM

## 2014-11-13 NOTE — Patient Instructions (Signed)
You can increase your gabapentin to 600mg  at nighttime. Call me in 2-3 weeks if symptoms are not improving or if you do not notice much difference with the increase.

## 2014-11-13 NOTE — Addendum Note (Signed)
Addended by: Celesta Gentile R on: 11/13/2014 10:35 AM   Modules accepted: Level of Service

## 2014-11-13 NOTE — Progress Notes (Signed)
   Subjective:    Patient ID: Sarah Alvarez, female    DOB: 1950-07-19, 64 y.o.   MRN: [id number]  HPI  Ms. Carlis Abbott, 64 year old female, presents the office today with complaints of numbness and burning to both of her feet. She states that she has been on gabapentin for approximate one year and last month or gabapentin was increased to 300 mg 3 times a day. She states that since the increase in the gabapentin she continues to have the burning and numbness to her feet and almost a crawling-like sensation to her legs particularly at night. She denies any history of injury or trauma denies any swelling or redness overlying the feet. She does have a history of gout to the left foot which typically flares up in the winter and she's had no problems without recently. She states that her blood sugar fluctuates. Denies any history of ulceration. Denies any claudication symptoms. No other complaints at this time.  Review of Systems  HENT: Positive for sinus pressure and sneezing.   Respiratory: Positive for cough.   Gastrointestinal: Positive for constipation.  Endocrine:       EXCESSIVE THIRST  Musculoskeletal: Positive for back pain.       JOINT PAIN AND MUSCLE PAIN  Neurological: Positive for numbness.  Hematological: Bruises/bleeds easily.       Objective:   Physical Exam AAO x3, NAD DP/PT pulses palpable bilaterally, CRT less than 3 seconds Protective sensation decreased with Derrel Nip mom,, decreased vibratory sensation, Achilles tendon reflex intact. There is no areas of tenderness to bilateral lower extremities. No overlying edema, erythema, increase in warmth. Mild hammertoe contractures are present. MMT 5/5, ROM WNL No open lesions or pre-ulcerative lesions identified bilaterally. No pain with calf compression, swelling, warmth, erythema.     Assessment & Plan:  64 year old female presents the office with complaints consistent with diabetic neuropathy. -Treatment options were  discussed including alternatives, risks, complications. -At this time her symptoms do appear to be consistent with neuropathy. She has increased her dosage within the last month without any resolution. I did discuss with her she can increase the gabapentin to 600 mg at nighttime to see if this helps alleviate the pain and night. I also discussed with her possibly changes medication to Lyrica however as there is room to increase the dose of gabapentin we will continue with this for now. -Discussed the importance of daily foot inspection. -Follow-up in 3 months or sooner if any palms are to arise. I discussed that if her symptoms are not improving despite increase in gabapentin to call the office and we can further discuss.

## 2014-12-10 ENCOUNTER — Other Ambulatory Visit: Payer: Self-pay | Admitting: Internal Medicine

## 2014-12-11 ENCOUNTER — Telehealth: Payer: Self-pay | Admitting: *Deleted

## 2014-12-11 DIAGNOSIS — R200 Anesthesia of skin: Secondary | ICD-10-CM

## 2014-12-11 DIAGNOSIS — E139 Other specified diabetes mellitus without complications: Secondary | ICD-10-CM

## 2014-12-11 MED ORDER — GABAPENTIN 300 MG PO CAPS: 300.0000 mg | ORAL_CAPSULE | Freq: Three times a day (TID) | ORAL | Status: DC

## 2014-12-11 NOTE — Telephone Encounter (Addendum)
Pt states the Gabapentin 300mg  4 times a day, isn't helping, and she also needs a refill if there is to be a change.  I left a message with Dr. Leigh Aurora orders to make an appt, and I informed pt Dr. Jacqualyn Posey refilled Gabapentin to take as previously ordered until seen in office.

## 2014-12-11 NOTE — Telephone Encounter (Signed)
Pt called to make certain the Gabapentin was called to [place].  I informed the pt it was and transferred pt to schedulers.

## 2014-12-11 NOTE — Telephone Encounter (Signed)
She should come in and we can discuss switching to lyrica. Thanks.

## 2014-12-13 ENCOUNTER — Ambulatory Visit: Payer: Medicaid Other | Admitting: Podiatry

## 2014-12-16 ENCOUNTER — Encounter: Payer: Self-pay | Admitting: Internal Medicine

## 2014-12-16 ENCOUNTER — Ambulatory Visit: Payer: Medicaid Other | Attending: Internal Medicine | Admitting: Internal Medicine

## 2014-12-16 VITALS — BP 139/67 | HR 103 | Temp 98.0°F | Resp 16 | Wt 188.8 lb

## 2014-12-16 DIAGNOSIS — Z9049 Acquired absence of other specified parts of digestive tract: Secondary | ICD-10-CM | POA: Insufficient documentation

## 2014-12-16 DIAGNOSIS — E139 Other specified diabetes mellitus without complications: Secondary | ICD-10-CM | POA: Diagnosis not present

## 2014-12-16 DIAGNOSIS — Z90710 Acquired absence of both cervix and uterus: Secondary | ICD-10-CM | POA: Diagnosis not present

## 2014-12-16 DIAGNOSIS — G2581 Restless legs syndrome: Secondary | ICD-10-CM | POA: Insufficient documentation

## 2014-12-16 DIAGNOSIS — E785 Hyperlipidemia, unspecified: Secondary | ICD-10-CM | POA: Insufficient documentation

## 2014-12-16 DIAGNOSIS — E119 Type 2 diabetes mellitus without complications: Secondary | ICD-10-CM | POA: Insufficient documentation

## 2014-12-16 DIAGNOSIS — Z87891 Personal history of nicotine dependence: Secondary | ICD-10-CM | POA: Diagnosis not present

## 2014-12-16 DIAGNOSIS — G8929 Other chronic pain: Secondary | ICD-10-CM | POA: Diagnosis not present

## 2014-12-16 DIAGNOSIS — Z7902 Long term (current) use of antithrombotics/antiplatelets: Secondary | ICD-10-CM | POA: Diagnosis not present

## 2014-12-16 DIAGNOSIS — M545 Low back pain, unspecified: Secondary | ICD-10-CM

## 2014-12-16 DIAGNOSIS — Z9861 Coronary angioplasty status: Secondary | ICD-10-CM | POA: Insufficient documentation

## 2014-12-16 DIAGNOSIS — I2510 Atherosclerotic heart disease of native coronary artery without angina pectoris: Secondary | ICD-10-CM | POA: Diagnosis not present

## 2014-12-16 DIAGNOSIS — Z794 Long term (current) use of insulin: Secondary | ICD-10-CM | POA: Insufficient documentation

## 2014-12-16 DIAGNOSIS — I10 Essential (primary) hypertension: Secondary | ICD-10-CM | POA: Diagnosis not present

## 2014-12-16 DIAGNOSIS — Z7982 Long term (current) use of aspirin: Secondary | ICD-10-CM | POA: Diagnosis not present

## 2014-12-16 LAB — POCT GLYCOSYLATED HEMOGLOBIN (HGB A1C): HEMOGLOBIN A1C: 8.5

## 2014-12-16 LAB — GLUCOSE, POCT (MANUAL RESULT ENTRY): POC Glucose: 148 mg/dl — AB (ref 70–99)

## 2014-12-16 MED ORDER — CYCLOBENZAPRINE HCL 5 MG PO TABS: 5.0000 mg | ORAL_TABLET | Freq: Every day | ORAL | Status: DC

## 2014-12-16 MED ORDER — DICLOFENAC SODIUM 1 % TD GEL: TRANSDERMAL | Status: DC

## 2014-12-16 MED ORDER — METFORMIN HCL 1000 MG PO TABS: ORAL_TABLET | ORAL | Status: DC

## 2014-12-16 MED ORDER — GLIPIZIDE 10 MG PO TABS: ORAL_TABLET | ORAL | Status: DC

## 2014-12-16 MED ORDER — BENAZEPRIL HCL 5 MG PO TABS: ORAL_TABLET | ORAL | Status: DC

## 2014-12-16 MED ORDER — FAMOTIDINE 20 MG PO TABS: ORAL_TABLET | ORAL | Status: DC

## 2014-12-16 MED ORDER — CLOPIDOGREL BISULFATE 75 MG PO TABS: 75.0000 mg | ORAL_TABLET | Freq: Every day | ORAL | Status: DC

## 2014-12-16 NOTE — Progress Notes (Signed)
MRN: [id number] Name: Sarah Alvarez  Sex: female Age: 64 y.o. DOB: Jul 29, 1950  Allergies: Review of patient's allergies indicates no known allergies.  Chief Complaint  Patient presents with  . Follow-up    HPI: Patient is 64 y.o. female who has history of diabetes, hypertension, hyperlipidemia, CAD comes today for followup, her hemoglobin A1c has trended down , she reports only one time she had blood sugar dropped to 44 mg/dL, as per patient she ate blood sugar went up, she denies any headache dizziness chest and shortness of breath, today she is requesting refill on her medications. She is also following up with the cardiology.patient is requesting referral to see an ophthalmologist.  Past Medical History  Diagnosis Date  . Staph infection   . Restless leg syndrome   . Chronic mesenteric ischemia     s/p SMA stent 06/22/13  . Coronary artery disease   . Hypertension   . Hyperlipidemia   . Diabetes mellitus     Type  II    Past Surgical History  Procedure Laterality Date  . Cholecystectomy    . Abdominal hysterectomy    . Coronary angioplasty      ? LCX stent ~ 2000 in [place], [place] with LCX stent [facility] 09/1999 for reported re-instent stenosis  . Endarterectomy Right 07/19/2013    Procedure: ENDARTERECTOMY CAROTID-RIGHT;  Surgeon: Angelia Mould, MD;  Location: [place];  Service: Vascular;  Laterality: Right;  . Carotid endarterectomy Right 07-19-13  . Insertion of retrograde carotid stent Left 03/05/2014    Procedure: 1)Cutdown of open exposure left common carotid; 2) Retrograde left common carotid stent; 3) Repair of left common carotid artery.;  Surgeon: Elam Dutch, MD;  Location: [place];  Service: Vascular;  Laterality: Left;  . Arch aortogram N/A 02/11/2014    Procedure: ARCH AORTOGRAM;  Surgeon: Angelia Mould, MD;  Location: [facility] CATH LAB;  Service: Cardiovascular;  Laterality: N/A;  . Left heart catheterization with coronary angiogram N/A 10/03/2014   Procedure: LEFT HEART CATHETERIZATION WITH CORONARY ANGIOGRAM;  Surgeon: Burnell Blanks, MD;  Location: [facility] CATH LAB;  Service: Cardiovascular;  Laterality: N/A;      Medication List       This list is accurate as of: 12/16/14 11:10 AM.  Always use your most recent med list.               accu-chek multiclix lancets  Use as instructed     acetaminophen 650 MG CR tablet  Commonly known as:  TYLENOL  Take 650 mg by mouth every 8 (eight) hours as needed for pain.     aspirin 325 MG tablet  Take 1 tablet (325 mg total) by mouth daily.     atorvastatin 20 MG tablet  Commonly known as:  LIPITOR  Take 1 tablet (20 mg total) by mouth daily.     benazepril 5 MG tablet  Commonly known as:  LOTENSIN  TAKE 2 TABLETS BY MOUTH EVERY MORNING.     clopidogrel 75 MG tablet  Commonly known as:  PLAVIX  Take 1 tablet (75 mg total) by mouth daily with breakfast.     cyclobenzaprine 5 MG tablet  Commonly known as:  FLEXERIL  Take 1 tablet (5 mg total) by mouth at bedtime.     diclofenac sodium 1 % Gel  Commonly known as:  VOLTAREN  APPLY 4 GRAMS TOPICALLY 4 TIMES DAILY.     famotidine 20 MG tablet  Commonly known as:  PEPCID  TAKE 1 TABLET BY MOUTH 2 TIMES DAILY.     gabapentin 300 MG capsule  Commonly known as:  NEURONTIN  Take 1 capsule (300 mg total) by mouth 3 (three) times daily.     glipiZIDE 10 MG tablet  Commonly known as:  GLUCOTROL  TAKE 1 TABLET BY MOUTH 2 TIMES DAILY BEFORE A MEAL.     glucose blood test strip  Commonly known as:  ACCU-CHEK AVIVA  Use as instructed     insulin glargine 100 UNIT/ML injection  Commonly known as:  LANTUS  Inject 0.65 mLs (65 Units total) into the skin at bedtime.     loratadine 10 MG tablet  Commonly known as:  CLARITIN  Take 1 tablet (10 mg total) by mouth daily as needed for allergies or rhinitis.     metFORMIN 500 MG (MOD) 24 hr tablet  Commonly known as:  GLUMETZA  Take 1 tablet (500 mg total) by mouth 2 (two) times  daily with a meal.     metFORMIN 1000 MG tablet  Commonly known as:  GLUCOPHAGE  TAKE 1 TABLET BY MOUTH 2 TIMES DAILY WITH A MEAL.     oxymetazoline 0.05 % nasal spray  Commonly known as:  AFRIN NASAL SPRAY  Place 1 spray into both nostrils 2 (two) times daily as needed for congestion.     Vitamin D (Ergocalciferol) 50000 UNITS Caps capsule  Commonly known as:  DRISDOL  Take 50,000 Units by mouth every 7 (seven) days. sunday        Meds ordered this encounter  Medications  . benazepril (LOTENSIN) 5 MG tablet    Sig: TAKE 2 TABLETS BY MOUTH EVERY MORNING.    Dispense:  60 tablet    Refill:  3  . clopidogrel (PLAVIX) 75 MG tablet    Sig: Take 1 tablet (75 mg total) by mouth daily with breakfast.    Dispense:  30 tablet    Refill:  3  . cyclobenzaprine (FLEXERIL) 5 MG tablet    Sig: Take 1 tablet (5 mg total) by mouth at bedtime.    Dispense:  30 tablet    Refill:  3  . diclofenac sodium (VOLTAREN) 1 % GEL    Sig: APPLY 4 GRAMS TOPICALLY 4 TIMES DAILY.    Dispense:  100 g    Refill:  2  . famotidine (PEPCID) 20 MG tablet    Sig: TAKE 1 TABLET BY MOUTH 2 TIMES DAILY.    Dispense:  60 tablet    Refill:  3  . glipiZIDE (GLUCOTROL) 10 MG tablet    Sig: TAKE 1 TABLET BY MOUTH 2 TIMES DAILY BEFORE A MEAL.    Dispense:  60 tablet    Refill:  3  . metFORMIN (GLUCOPHAGE) 1000 MG tablet    Sig: TAKE 1 TABLET BY MOUTH 2 TIMES DAILY WITH A MEAL.    Dispense:  60 tablet    Refill:  3    2nd requst - 1st req sent 06/10/14 - pt last seen 05/15/14 pt has been out since 06/10/14 [facility],RPh    Immunization History  Administered Date(s) Administered  . Influenza Split 06/05/2012  . Influenza,inj,Quad PF,36+ Mos 06/12/2013, 06/21/2013  . Pneumococcal Polysaccharide-23 06/21/2013    Family History  Problem Relation Age of Onset  . Diabetes Mother   . Stroke Mother   . Hyperlipidemia Mother   . Cancer Father   . Heart disease Father     PVD and  CAROTID  .  Heart attack Father     . Diabetes Sister   . Hypertension Sister   . Heart disease Sister     Before age 31  . Hyperlipidemia Sister   . Varicose Veins Sister   . Cancer Brother   . Cancer Maternal Aunt     breast cancer   . Diabetes Maternal Grandmother   . Diabetes Paternal Grandmother     History  Substance Use Topics  . Smoking status: Former Smoker -- 1.00 packs/day for 20 years    Quit date: 06/21/2013  . Smokeless tobacco: Never Used  . Alcohol Use: No    Review of Systems   As noted in HPI  Filed Vitals:   12/16/14 1043  BP: 139/67  Pulse: 103  Temp: 98 F (36.7 C)  Resp: 16    Physical Exam  Physical Exam  Constitutional: No distress.  Eyes: EOM are normal. Pupils are equal, round, and reactive to light.  Cardiovascular: Normal rate and regular rhythm.   Pulmonary/Chest: Breath sounds normal. No respiratory distress. She has no wheezes. She has no rales.  Musculoskeletal: She exhibits no edema.    CBC    Component Value Date/Time   WBC 7.4 10/02/2014 0847   RBC 3.33* 10/02/2014 0847   HGB 10.4* 10/02/2014 0847   HCT 31.1* 10/02/2014 0847   PLT 293.0 10/02/2014 0847   MCV 93.4 10/02/2014 0847   LYMPHSABS 1.4 10/02/2014 0847   MONOABS 0.3 10/02/2014 0847   EOSABS 0.1 10/02/2014 0847   BASOSABS 0.0 10/02/2014 0847    CMP     Component Value Date/Time   NA 136 10/02/2014 0847   K 4.3 10/02/2014 0847   CL 104 10/02/2014 0847   CO2 27 10/02/2014 0847   GLUCOSE 340* 10/02/2014 0847   BUN 19 10/02/2014 0847   CREATININE 0.73 10/02/2014 0847   CREATININE 0.67 08/13/2014 1510   CALCIUM 8.9 10/02/2014 0847   PROT 6.1 08/13/2014 1510   ALBUMIN 3.9 08/13/2014 1510   AST 12 08/13/2014 1510   ALT 10 08/13/2014 1510   ALKPHOS 84 08/13/2014 1510   BILITOT 0.3 08/13/2014 1510   GFRNONAA >89 08/13/2014 1510   GFRNONAA >90 03/06/2014 0253   GFRAA >89 08/13/2014 1510   GFRAA >90 03/06/2014 0253    Lab Results  Component Value Date/Time   CHOL 166 09/16/2014 01:11  PM    Lab Results  Component Value Date/Time   HGBA1C 8.50 12/16/2014 10:45 AM   HGBA1C 13.4* 08/13/2014 03:10 PM    Lab Results  Component Value Date/Time   AST 12 08/13/2014 03:10 PM    Assessment and Plan  Other specified diabetes mellitus without complications - Plan:  Results for orders placed or performed in visit on 12/16/14  Glucose (CBG)  Result Value Ref Range   POC Glucose 148.0 (A) 70 - 99 mg/dl  HgB A1c  Result Value Ref Range   Hemoglobin A1C 8.50    Hemoglobin A1c has trended down from 13.4% weight 8.5%, she had  one episode of low blood sugar level at 4 AM, she's currently on metformin, Glucotrol, Lantus 65 units each bedtime, I have advised patient to reduce the dose of Lantus to 63 units if her blood sugar drops frequently, she understand and verbalized instructions, will check blood chemistry on the following visit, HgB A1c, glipiZIDE (GLUCOTROL) 10 MG tablet, metFORMIN (GLUCOPHAGE) 1000 MG tablet, Ambulatory referral to Ophthalmology  Essential hypertension, benign - Plan: blood pressure is well controlled continue with current meds  benazepril (LOTENSIN) 5 MG tablet  CAD in native artery - Plan: clopidogrel (PLAVIX) 75 MG tablet, following up with cardiology.  Chronic lower back pain - Plan: diclofenac sodium (VOLTAREN) 1 % GEL   Return in about 3 months (around 03/18/2015), or if symptoms worsen or fail to improve.   This note has been created with Surveyor, quantity. Any transcriptional errors are unintentional.    Lorayne Marek, MD

## 2014-12-16 NOTE — Patient Instructions (Signed)
Diabetes Mellitus and Food It is important for you to manage your blood sugar (glucose) level. Your blood glucose level can be greatly affected by what you eat. Eating healthier foods in the appropriate amounts throughout the day at about the same time each day will help you control your blood glucose level. It can also help slow or prevent worsening of your diabetes mellitus. Healthy eating may even help you improve the level of your blood pressure and reach or maintain a healthy weight.  HOW CAN FOOD AFFECT ME? Carbohydrates Carbohydrates affect your blood glucose level more than any other type of food. Your dietitian will help you determine how many carbohydrates to eat at each meal and teach you how to count carbohydrates. Counting carbohydrates is important to keep your blood glucose at a healthy level, especially if you are using insulin or taking certain medicines for diabetes mellitus. Alcohol Alcohol can cause sudden decreases in blood glucose (hypoglycemia), especially if you use insulin or take certain medicines for diabetes mellitus. Hypoglycemia can be a life-threatening condition. Symptoms of hypoglycemia (sleepiness, dizziness, and disorientation) are similar to symptoms of having too much alcohol.  If your health care provider has given you approval to drink alcohol, do so in moderation and use the following guidelines:  Women should not have more than one drink per day, and men should not have more than two drinks per day. One drink is equal to:  12 oz of beer.  5 oz of wine.  1 oz of hard liquor.  Do not drink on an empty stomach.  Keep yourself hydrated. Have water, diet soda, or unsweetened iced tea.  Regular soda, juice, and other mixers might contain a lot of carbohydrates and should be counted. WHAT FOODS ARE NOT RECOMMENDED? As you make food choices, it is important to remember that all foods are not the same. Some foods have fewer nutrients per serving than other  foods, even though they might have the same number of calories or carbohydrates. It is difficult to get your body what it needs when you eat foods with fewer nutrients. Examples of foods that you should avoid that are high in calories and carbohydrates but low in nutrients include:  Trans fats (most processed foods list trans fats on the Nutrition Facts label).  Regular soda.  Juice.  Candy.  Sweets, such as cake, pie, doughnuts, and cookies.  Fried foods. WHAT FOODS CAN I EAT? Have nutrient-rich foods, which will nourish your body and keep you healthy. The food you should eat also will depend on several factors, including:  The calories you need.  The medicines you take.  Your weight.  Your blood glucose level.  Your blood pressure level.  Your cholesterol level. You also should eat a variety of foods, including:  Protein, such as meat, poultry, fish, tofu, nuts, and seeds (lean animal proteins are best).  Fruits.  Vegetables.  Dairy products, such as milk, cheese, and yogurt (low fat is best).  Breads, grains, pasta, cereal, rice, and beans.  Fats such as olive oil, trans fat-free margarine, canola oil, avocado, and olives. DOES EVERYONE WITH DIABETES MELLITUS HAVE THE SAME MEAL PLAN? Because every person with diabetes mellitus is different, there is not one meal plan that works for everyone. It is very important that you meet with a dietitian who will help you create a meal plan that is just right for you. Document Released: 03/25/2005 Document Revised: 07/03/2013 Document Reviewed: 05/25/2013 ExitCare Patient Information 2015 [employer]. This   information is not intended to replace advice given to you by your health care provider. Make sure you discuss any questions you have with your health care provider. DASH Eating Plan DASH stands for "Dietary Approaches to Stop Hypertension." The DASH eating plan is a healthy eating plan that has been shown to reduce high  blood pressure (hypertension). Additional health benefits may include reducing the risk of type 2 diabetes mellitus, heart disease, and stroke. The DASH eating plan may also help with weight loss. WHAT DO I NEED TO KNOW ABOUT THE DASH EATING PLAN? For the DASH eating plan, you will follow these general guidelines:  Choose foods with a percent daily value for sodium of less than 5% (as listed on the food label).  Use salt-free seasonings or herbs instead of table salt or sea salt.  Check with your health care provider or pharmacist before using salt substitutes.  Eat lower-sodium products, often labeled as "lower sodium" or "no salt added."  Eat fresh foods.  Eat more vegetables, fruits, and low-fat dairy products.  Choose whole grains. Look for the word "whole" as the first word in the ingredient list.  Choose fish and skinless chicken or turkey more often than red meat. Limit fish, poultry, and meat to 6 oz (170 g) each day.  Limit sweets, desserts, sugars, and sugary drinks.  Choose heart-healthy fats.  Limit cheese to 1 oz (28 g) per day.  Eat more home-cooked food and less restaurant, buffet, and fast food.  Limit fried foods.  Cook foods using methods other than frying.  Limit canned vegetables. If you do use them, rinse them well to decrease the sodium.  When eating at a restaurant, ask that your food be prepared with less salt, or no salt if possible. WHAT FOODS CAN I EAT? Seek help from a dietitian for individual calorie needs. Grains Whole grain or whole wheat bread. Brown rice. Whole grain or whole wheat pasta. Quinoa, bulgur, and whole grain cereals. Low-sodium cereals. Corn or whole wheat flour tortillas. Whole grain cornbread. Whole grain crackers. Low-sodium crackers. Vegetables Fresh or frozen vegetables (raw, steamed, roasted, or grilled). Low-sodium or reduced-sodium tomato and vegetable juices. Low-sodium or reduced-sodium tomato sauce and paste. Low-sodium  or reduced-sodium canned vegetables.  Fruits All fresh, canned (in natural juice), or frozen fruits. Meat and Other Protein Products Ground beef (85% or leaner), grass-fed beef, or beef trimmed of fat. Skinless chicken or turkey. Ground chicken or turkey. Pork trimmed of fat. All fish and seafood. Eggs. Dried beans, peas, or lentils. Unsalted nuts and seeds. Unsalted canned beans. Dairy Low-fat dairy products, such as skim or 1% milk, 2% or reduced-fat cheeses, low-fat ricotta or cottage cheese, or plain low-fat yogurt. Low-sodium or reduced-sodium cheeses. Fats and Oils Tub margarines without trans fats. Light or reduced-fat mayonnaise and salad dressings (reduced sodium). Avocado. Safflower, olive, or canola oils. Natural peanut or almond butter. Other Unsalted popcorn and pretzels. The items listed above may not be a complete list of recommended foods or beverages. Contact your dietitian for more options. WHAT FOODS ARE NOT RECOMMENDED? Grains White bread. White pasta. White rice. Refined cornbread. Bagels and croissants. Crackers that contain trans fat. Vegetables Creamed or fried vegetables. Vegetables in a cheese sauce. Regular canned vegetables. Regular canned tomato sauce and paste. Regular tomato and vegetable juices. Fruits Dried fruits. Canned fruit in light or heavy syrup. Fruit juice. Meat and Other Protein Products Fatty cuts of meat. Ribs, chicken wings, bacon, sausage, bologna, salami, chitterlings, fatback, hot   dogs, bratwurst, and packaged luncheon meats. Salted nuts and seeds. Canned beans with salt. Dairy Whole or 2% milk, cream, half-and-half, and cream cheese. Whole-fat or sweetened yogurt. Full-fat cheeses or blue cheese. Nondairy creamers and whipped toppings. Processed cheese, cheese spreads, or cheese curds. Condiments Onion and garlic salt, seasoned salt, table salt, and sea salt. Canned and packaged gravies. Worcestershire sauce. Tartar sauce. Barbecue sauce.  Teriyaki sauce. Soy sauce, including reduced sodium. Steak sauce. Fish sauce. Oyster sauce. Cocktail sauce. Horseradish. Ketchup and mustard. Meat flavorings and tenderizers. Bouillon cubes. Hot sauce. Tabasco sauce. Marinades. Taco seasonings. Relishes. Fats and Oils Butter, stick margarine, lard, shortening, ghee, and bacon fat. Coconut, palm kernel, or palm oils. Regular salad dressings. Other Pickles and olives. Salted popcorn and pretzels. The items listed above may not be a complete list of foods and beverages to avoid. Contact your dietitian for more information. WHERE CAN I FIND MORE INFORMATION? National Heart, Lung, and [employer] Institute: www.nhlbi.nih.gov/health/health-topics/topics/dash/ Document Released: 06/17/2011 Document Revised: 11/12/2013 Document Reviewed: 05/02/2013 ExitCare Patient Information 2015 [employer]. This information is not intended to replace advice given to you by your health care provider. Make sure you discuss any questions you have with your health care provider.  

## 2014-12-16 NOTE — Progress Notes (Signed)
Patient here for follow up on her diabetes and in  Need of medication refills

## 2014-12-30 ENCOUNTER — Encounter: Payer: Self-pay | Admitting: Podiatry

## 2014-12-30 ENCOUNTER — Ambulatory Visit (INDEPENDENT_AMBULATORY_CARE_PROVIDER_SITE_OTHER): Payer: Medicaid Other | Admitting: Podiatry

## 2014-12-30 VITALS — BP 113/62 | HR 96 | Resp 12

## 2014-12-30 DIAGNOSIS — R200 Anesthesia of skin: Secondary | ICD-10-CM

## 2014-12-30 DIAGNOSIS — E1140 Type 2 diabetes mellitus with diabetic neuropathy, unspecified: Secondary | ICD-10-CM | POA: Diagnosis not present

## 2014-12-30 DIAGNOSIS — R208 Other disturbances of skin sensation: Secondary | ICD-10-CM | POA: Diagnosis not present

## 2014-12-30 DIAGNOSIS — E1149 Type 2 diabetes mellitus with other diabetic neurological complication: Secondary | ICD-10-CM

## 2014-12-30 MED ORDER — GABAPENTIN 300 MG PO CAPS: 300.0000 mg | ORAL_CAPSULE | Freq: Four times a day (QID) | ORAL | Status: DC

## 2015-01-04 NOTE — Progress Notes (Signed)
Patient ID: Sarah Alvarez, female   DOB: 04-27-1951, 64 y.o.   MRN: [id number]  Subjective: 64 year old female presents the office they for follow-up evaluation of neuropathy. She states she continues have pain to her feet as well as tingling and numbness. She states that she's continue with Within 3 mg 3 times a day and she has not increase the dosage. She denies any history of injury or trauma to the area. She gets some intermittent swelling to her feet/ankles bilaterally without any associated erythema her increase in warmth. No other complaints at this time and no acute changes since last appointment.   Objective: AAO x3, NAD DP/PT pulses palpable bilaterally, CRT less than 3 seconds Protective sensation decreased with Simms Weinstein monofilament, decreased vibratory sensation, Achilles tendon reflex intact. There is no areas of pinpoint bony tenderness or pain the vibratory sensation to bilateral lower extremities. There is mild edema to bilateral lower extremities have there is no associated erythema or increase in warmth. MMT 5/5, ROM WNL No open lesions or pre-ulcerative lesions identified bilaterally. There is no pain with calf compression, swelling, warmth, erythema.  Assessment: 63 year old female with bilateral pain, likely neuropathy.  Plan: -Treatment options were discussed include alternatives, risks, complications. -At this time I believe that she can increase her dose of gabapentin to see this helps alleviate her symptoms. Discussed with her shoes to taking 600 mg at nighttime 300 mg in the morning and at lunch. She can slowly increase to 600mg  3 times a day. Discussed with her how to do this. If she is unable to tolerate this much medication directed to call the office. -Follow-up in 4 weeks or sooner should any problems arise. In the meantime, call the office with any questions/concerns/change in symptoms. Intensive. Next appointment we'll discuss possible change to Lyrica or  neurology evaluation. *x-ray next appointment.

## 2015-01-06 ENCOUNTER — Other Ambulatory Visit: Payer: Self-pay

## 2015-01-27 ENCOUNTER — Ambulatory Visit (INDEPENDENT_AMBULATORY_CARE_PROVIDER_SITE_OTHER): Payer: Medicaid Other

## 2015-01-27 ENCOUNTER — Encounter: Payer: Self-pay | Admitting: Podiatry

## 2015-01-27 ENCOUNTER — Ambulatory Visit (INDEPENDENT_AMBULATORY_CARE_PROVIDER_SITE_OTHER): Payer: Medicaid Other | Admitting: Podiatry

## 2015-01-27 VITALS — BP 98/53 | HR 99 | Resp 15

## 2015-01-27 DIAGNOSIS — G609 Hereditary and idiopathic neuropathy, unspecified: Secondary | ICD-10-CM

## 2015-01-27 DIAGNOSIS — M79672 Pain in left foot: Principal | ICD-10-CM

## 2015-01-27 DIAGNOSIS — I739 Peripheral vascular disease, unspecified: Secondary | ICD-10-CM | POA: Diagnosis not present

## 2015-01-27 DIAGNOSIS — M79671 Pain in right foot: Secondary | ICD-10-CM

## 2015-01-27 DIAGNOSIS — E1140 Type 2 diabetes mellitus with diabetic neuropathy, unspecified: Secondary | ICD-10-CM

## 2015-01-27 DIAGNOSIS — E1149 Type 2 diabetes mellitus with other diabetic neurological complication: Secondary | ICD-10-CM

## 2015-01-27 DIAGNOSIS — R200 Anesthesia of skin: Secondary | ICD-10-CM

## 2015-01-28 NOTE — Progress Notes (Signed)
Patient ID: LADA FULBRIGHT, female   DOB: 06/20/1951, 64 y.o.   MRN: [id number]  Subjective: 64 year old female presents the office they for follow-up evaluation of neuropathy. She states she continues have pain to her feet as well as tingling and numbness. She states that she's continue with With 300mg  of gabapentin twice a day and 600mg  at night. She states that she does get some pain to her legs when walking and she stands to walk about 1 block or so without having to stop. She gets some intermittent swelling to her feet/ankles bilaterally without any associated erythema her increase in warmth. No other complaints at this time and no acute changes since last appointment.   Objective: AAO x3, NAD DP/PT pulses palpable bilaterally, CRT less than 3 seconds Protective sensation decreased with Simms Weinstein monofilament, decreased vibratory sensation, Achilles tendon reflex intact. There is mild discomfort to palpation overlying the right fifth toe particularly with shoe gear. There is no area pinpoint tenderness to the area and there is no pain with vibratory sensation.  There are no other areas of pinpoint bony tenderness or pain the vibratory sensation to bilateral lower extremities. There is mild edema to bilateral lower extremities have there is no associated erythema or increase in warmth. MMT 5/5, ROM WNL No open lesions or pre-ulcerative lesions identified bilaterally. There is no pain with calf compression, swelling, warmth, erythema.  Assessment: 64 year old female with bilateral pain, likely neuropathy.  Plan: -Treatment options were discussed include alternatives, risks, complications. -Recommended to slowly increase her gabapentin 600 mg 3 times a day. -She does have some claudication symptoms will obtain vascular studies although she does have palpable pulses. -Offloading pad for fifth toe -Shoe gear modifications -Follow-up in 4 weeks or sooner should any problems arise. In the  meantime, call the office with any questions/concerns/change in symptoms.   Celesta Gentile, DPM

## 2015-01-29 ENCOUNTER — Other Ambulatory Visit: Payer: Self-pay | Admitting: Podiatry

## 2015-01-29 ENCOUNTER — Telehealth: Payer: Self-pay | Admitting: *Deleted

## 2015-01-29 ENCOUNTER — Telehealth: Payer: Self-pay

## 2015-01-29 DIAGNOSIS — I739 Peripheral vascular disease, unspecified: Secondary | ICD-10-CM

## 2015-01-29 NOTE — Telephone Encounter (Addendum)
Pt scheduled with [facility] for Lower arterial dopplers for B/L claudication. Oswaldo Conroy [phone] states pt needs Medicaid prior authorization.  I registered pt in [place] and faxed required documentation.  Oswaldo Conroy - [phone] states if Prior Auth not acquired at this time, will need to cancel appt until received.  I informed pt we had started the paperwork for the Prior Auth, but it had not been received, so the appt 01/30/2015 had to be cancelled, but could be rescheduled once received.  Pt states understanding.  Prior Auth for lower arterial doppler for claudication, states ABI may confirm the diagnosis.  Faxed new request for ABI [id number] for Peripheral vascular disease ICD10 I73.9 to [employer] [phone].

## 2015-01-29 NOTE — Telephone Encounter (Signed)
Received call from [facility]/Precert from [facility] was not obtained yet . Pt will be resch PER  VALERIE

## 2015-01-30 ENCOUNTER — Ambulatory Visit (HOSPITAL_COMMUNITY): Payer: Medicaid Other

## 2015-01-30 NOTE — Telephone Encounter (Signed)
Please order ABI

## 2015-02-03 HISTORY — PX: CATARACT EXTRACTION W/ INTRAOCULAR LENS IMPLANT: SHX1309

## 2015-02-10 ENCOUNTER — Telehealth: Payer: Self-pay

## 2015-02-10 NOTE — Telephone Encounter (Signed)
Patient called stated her blood sugar at the eye dr was in the four hundreds this am  And when she got home and check it again it was at [name] with Dr Annitta Needs She can do between 5 and 10 units of lantus now and usual dose tonight and  We will see her in the am at her appointment

## 2015-02-11 ENCOUNTER — Inpatient Hospital Stay (HOSPITAL_COMMUNITY)
Admission: EM | Admit: 2015-02-11 | Discharge: 2015-02-14 | DRG: 812 | Disposition: A | Discharge status: Home / Self Care | Payer: Medicaid Other | Attending: Oncology | Admitting: Oncology

## 2015-02-11 ENCOUNTER — Encounter (HOSPITAL_COMMUNITY): Payer: Self-pay | Admitting: Cardiology

## 2015-02-11 ENCOUNTER — Ambulatory Visit: Payer: Medicaid Other | Admitting: Internal Medicine

## 2015-02-11 DIAGNOSIS — Z87891 Personal history of nicotine dependence: Secondary | ICD-10-CM

## 2015-02-11 DIAGNOSIS — M1990 Unspecified osteoarthritis, unspecified site: Secondary | ICD-10-CM | POA: Diagnosis present

## 2015-02-11 DIAGNOSIS — K551 Chronic vascular disorders of intestine: Secondary | ICD-10-CM | POA: Diagnosis not present

## 2015-02-11 DIAGNOSIS — N179 Acute kidney failure, unspecified: Secondary | ICD-10-CM | POA: Diagnosis present

## 2015-02-11 DIAGNOSIS — E860 Dehydration: Secondary | ICD-10-CM

## 2015-02-11 DIAGNOSIS — R011 Cardiac murmur, unspecified: Secondary | ICD-10-CM | POA: Diagnosis present

## 2015-02-11 DIAGNOSIS — D62 Acute posthemorrhagic anemia: Principal | ICD-10-CM | POA: Diagnosis present

## 2015-02-11 DIAGNOSIS — Z79899 Other long term (current) drug therapy: Secondary | ICD-10-CM

## 2015-02-11 DIAGNOSIS — K2960 Other gastritis without bleeding: Secondary | ICD-10-CM | POA: Diagnosis present

## 2015-02-11 DIAGNOSIS — J45909 Unspecified asthma, uncomplicated: Secondary | ICD-10-CM | POA: Diagnosis present

## 2015-02-11 DIAGNOSIS — Z7982 Long term (current) use of aspirin: Secondary | ICD-10-CM | POA: Diagnosis not present

## 2015-02-11 DIAGNOSIS — E1165 Type 2 diabetes mellitus with hyperglycemia: Secondary | ICD-10-CM | POA: Diagnosis present

## 2015-02-11 DIAGNOSIS — G8929 Other chronic pain: Secondary | ICD-10-CM | POA: Diagnosis present

## 2015-02-11 DIAGNOSIS — K550 Acute vascular disorders of intestine: Secondary | ICD-10-CM

## 2015-02-11 DIAGNOSIS — I5030 Unspecified diastolic (congestive) heart failure: Secondary | ICD-10-CM

## 2015-02-11 DIAGNOSIS — D124 Benign neoplasm of descending colon: Secondary | ICD-10-CM | POA: Diagnosis present

## 2015-02-11 DIAGNOSIS — Z95818 Presence of other cardiac implants and grafts: Secondary | ICD-10-CM

## 2015-02-11 DIAGNOSIS — I252 Old myocardial infarction: Secondary | ICD-10-CM

## 2015-02-11 DIAGNOSIS — Z955 Presence of coronary angioplasty implant and graft: Secondary | ICD-10-CM | POA: Diagnosis not present

## 2015-02-11 DIAGNOSIS — Z7902 Long term (current) use of antithrombotics/antiplatelets: Secondary | ICD-10-CM | POA: Diagnosis not present

## 2015-02-11 DIAGNOSIS — I2510 Atherosclerotic heart disease of native coronary artery without angina pectoris: Secondary | ICD-10-CM | POA: Diagnosis present

## 2015-02-11 DIAGNOSIS — R0989 Other specified symptoms and signs involving the circulatory and respiratory systems: Secondary | ICD-10-CM | POA: Diagnosis present

## 2015-02-11 DIAGNOSIS — I10 Essential (primary) hypertension: Secondary | ICD-10-CM | POA: Diagnosis present

## 2015-02-11 DIAGNOSIS — M545 Low back pain: Secondary | ICD-10-CM | POA: Diagnosis present

## 2015-02-11 DIAGNOSIS — K219 Gastro-esophageal reflux disease without esophagitis: Secondary | ICD-10-CM | POA: Diagnosis present

## 2015-02-11 DIAGNOSIS — E1151 Type 2 diabetes mellitus with diabetic peripheral angiopathy without gangrene: Secondary | ICD-10-CM

## 2015-02-11 DIAGNOSIS — Z794 Long term (current) use of insulin: Secondary | ICD-10-CM

## 2015-02-11 DIAGNOSIS — I6523 Occlusion and stenosis of bilateral carotid arteries: Secondary | ICD-10-CM

## 2015-02-11 DIAGNOSIS — IMO0002 Reserved for concepts with insufficient information to code with codable children: Secondary | ICD-10-CM

## 2015-02-11 DIAGNOSIS — E1142 Type 2 diabetes mellitus with diabetic polyneuropathy: Secondary | ICD-10-CM | POA: Diagnosis present

## 2015-02-11 DIAGNOSIS — K921 Melena: Secondary | ICD-10-CM | POA: Diagnosis not present

## 2015-02-11 DIAGNOSIS — R55 Syncope and collapse: Secondary | ICD-10-CM | POA: Diagnosis present

## 2015-02-11 HISTORY — DX: Type 2 diabetes mellitus with diabetic polyneuropathy: E11.42

## 2015-02-11 HISTORY — DX: Unspecified asthma, uncomplicated: J45.909

## 2015-02-11 HISTORY — DX: Unspecified osteoarthritis, unspecified site: M19.90

## 2015-02-11 HISTORY — DX: Type 2 diabetes mellitus without complications: E11.9

## 2015-02-11 HISTORY — DX: Anemia, unspecified: D64.9

## 2015-02-11 HISTORY — DX: Low back pain: M54.5

## 2015-02-11 HISTORY — DX: Other chronic pain: G89.29

## 2015-02-11 HISTORY — DX: Gastro-esophageal reflux disease without esophagitis: K21.9

## 2015-02-11 HISTORY — DX: Low back pain, unspecified: M54.50

## 2015-02-11 HISTORY — DX: Cardiac murmur, unspecified: R01.1

## 2015-02-11 LAB — CBC
HCT: 26 % — ABNORMAL LOW (ref 36.0–46.0)
HEMATOCRIT: 28.8 % — AB (ref 36.0–46.0)
HEMOGLOBIN: 8.7 g/dL — AB (ref 12.0–15.0)
Hemoglobin: 7.9 g/dL — ABNORMAL LOW (ref 12.0–15.0)
MCH: 22.7 pg — ABNORMAL LOW (ref 26.0–34.0)
MCH: 23 pg — ABNORMAL LOW (ref 26.0–34.0)
MCHC: 30.2 g/dL (ref 30.0–36.0)
MCHC: 30.4 g/dL (ref 30.0–36.0)
MCV: 75 fL — ABNORMAL LOW (ref 78.0–100.0)
MCV: 75.8 fL — AB (ref 78.0–100.0)
Platelets: 337 10*3/uL (ref 150–400)
Platelets: 303 10*3/uL (ref 150–400)
RBC: 3.84 MIL/uL — AB (ref 3.87–5.11)
RBC: 3.43 MIL/uL — AB (ref 3.87–5.11)
RDW: 17.4 % — AB (ref 11.5–15.5)
RDW: 17.5 % — ABNORMAL HIGH (ref 11.5–15.5)
WBC: 7.6 10*3/uL (ref 4.0–10.5)
WBC: 7.2 10*3/uL (ref 4.0–10.5)

## 2015-02-11 LAB — PREPARE RBC (CROSSMATCH)

## 2015-02-11 LAB — BASIC METABOLIC PANEL
Anion gap: 8 (ref 5–15)
BUN: 22 mg/dL — ABNORMAL HIGH (ref 6–20)
CO2: 25 mmol/L (ref 22–32)
Calcium: 8.7 mg/dL — ABNORMAL LOW (ref 8.9–10.3)
Chloride: 104 mmol/L (ref 101–111)
Creatinine, Ser: 1.15 mg/dL — ABNORMAL HIGH (ref 0.44–1.00)
GFR calc Af Amer: 57 mL/min — ABNORMAL LOW (ref >60)
GFR, EST NON AFRICAN AMERICAN: 50 mL/min — AB (ref >60)
Glucose, Bld: 308 mg/dL — ABNORMAL HIGH (ref 65–99)
POTASSIUM: 3.9 mmol/L (ref 3.5–5.1)
Sodium: 137 mmol/L (ref 135–145)

## 2015-02-11 LAB — I-STAT TROPONIN, ED: TROPONIN I, POC: 0 ng/mL (ref 0.00–0.08)

## 2015-02-11 LAB — I-STAT CG4 LACTIC ACID, ED: Lactic Acid, Venous: 2.82 mmol/L (ref 0.5–2.0)

## 2015-02-11 LAB — POC OCCULT BLOOD, ED: FECAL OCCULT BLD: NEGATIVE

## 2015-02-11 LAB — GLUCOSE, CAPILLARY
GLUCOSE-CAPILLARY: 145 mg/dL — AB (ref 65–99)
Glucose-Capillary: 252 mg/dL — ABNORMAL HIGH (ref 65–99)
Glucose-Capillary: 106 mg/dL — ABNORMAL HIGH (ref 65–99)

## 2015-02-11 LAB — PROTIME-INR
INR: 1.07 (ref 0.00–1.49)
Prothrombin Time: 14.1 seconds (ref 11.6–15.2)

## 2015-02-11 LAB — LACTIC ACID, PLASMA: LACTIC ACID, VENOUS: 1.7 mmol/L (ref 0.5–2.0)

## 2015-02-11 MED ORDER — PANTOPRAZOLE SODIUM 40 MG IV SOLR
40.0000 mg | Freq: Two times a day (BID) | INTRAVENOUS | Status: DC
  Administered 2015-02-11 – 2015-02-13 (×5): 40 mg via INTRAVENOUS
  Filled 2015-02-11 (×6): qty 40

## 2015-02-11 MED ORDER — ATORVASTATIN CALCIUM 20 MG PO TABS
20.0000 mg | ORAL_TABLET | Freq: Every day | ORAL | Status: DC
  Administered 2015-02-11 – 2015-02-14 (×4): 20 mg via ORAL
  Filled 2015-02-11 (×4): qty 1

## 2015-02-11 MED ORDER — DICLOFENAC SODIUM 1 % TD GEL
4.0000 g | Freq: Four times a day (QID) | TRANSDERMAL | Status: DC
  Administered 2015-02-11 – 2015-02-14 (×9): 4 g via TOPICAL
  Filled 2015-02-11: qty 100

## 2015-02-11 MED ORDER — KETOROLAC TROMETHAMINE 0.5 % OP SOLN
1.0000 [drp] | Freq: Four times a day (QID) | OPHTHALMIC | Status: DC
  Administered 2015-02-11 – 2015-02-14 (×12): 1 [drp] via OPHTHALMIC
  Filled 2015-02-11: qty 3

## 2015-02-11 MED ORDER — SODIUM CHLORIDE 0.9 % IV SOLN: Freq: Once | INTRAVENOUS | Status: DC

## 2015-02-11 MED ORDER — SODIUM CHLORIDE 0.9 % IV SOLN
INTRAVENOUS | Status: AC
  Administered 2015-02-11: 15:00:00 via INTRAVENOUS

## 2015-02-11 MED ORDER — GABAPENTIN 300 MG PO CAPS
300.0000 mg | ORAL_CAPSULE | Freq: Four times a day (QID) | ORAL | Status: DC
  Administered 2015-02-11 – 2015-02-14 (×11): 300 mg via ORAL
  Filled 2015-02-11 (×14): qty 1

## 2015-02-11 MED ORDER — INSULIN ASPART 100 UNIT/ML ~~LOC~~ SOLN
0.0000 [IU] | Freq: Three times a day (TID) | SUBCUTANEOUS | Status: DC
  Administered 2015-02-11: 1 [IU] via SUBCUTANEOUS
  Administered 2015-02-12: 3 [IU] via SUBCUTANEOUS
  Administered 2015-02-12: 2 [IU] via SUBCUTANEOUS
  Administered 2015-02-13: 5 [IU] via SUBCUTANEOUS
  Administered 2015-02-14: 7 [IU] via SUBCUTANEOUS

## 2015-02-11 MED ORDER — INSULIN GLARGINE 100 UNIT/ML ~~LOC~~ SOLN
25.0000 [IU] | Freq: Every day | SUBCUTANEOUS | Status: DC
  Administered 2015-02-12: 25 [IU] via SUBCUTANEOUS
  Filled 2015-02-11 (×2): qty 0.25

## 2015-02-11 MED ORDER — BOOST / RESOURCE BREEZE PO LIQD
1.0000 | Freq: Three times a day (TID) | ORAL | Status: DC
  Administered 2015-02-11 – 2015-02-14 (×8): 1 via ORAL

## 2015-02-11 MED ORDER — PREDNISOLONE ACETATE 1 % OP SUSP
1.0000 [drp] | Freq: Four times a day (QID) | OPHTHALMIC | Status: DC
  Administered 2015-02-11 – 2015-02-14 (×12): 1 [drp] via OPHTHALMIC
  Filled 2015-02-11: qty 1

## 2015-02-11 MED ORDER — SODIUM CHLORIDE 0.9 % IV BOLUS (SEPSIS)
1000.0000 mL | Freq: Once | INTRAVENOUS | Status: AC
  Administered 2015-02-11: 1000 mL via INTRAVENOUS

## 2015-02-11 MED ORDER — OFLOXACIN 0.3 % OP SOLN
1.0000 [drp] | Freq: Four times a day (QID) | OPHTHALMIC | Status: DC
  Administered 2015-02-11 – 2015-02-14 (×12): 1 [drp] via OPHTHALMIC
  Filled 2015-02-11: qty 5

## 2015-02-11 NOTE — H&P (Signed)
Date: 02/11/2015               Patient Name:  Sarah Alvarez MRN: [id number]  DOB: July 30, 1950 Age / Sex: 64 y.o., female   PCP: Lorayne Marek, MD         Medical Service: Internal Medicine Teaching Service         Attending Physician: Dr. Annia Belt, MD    First Contact: Dr. Zada Finders Pager: [phone]  Second Contact: Dr. Johnnette Gourd Pager: [phone]       After Hours (After 5p/  First Contact Pager: [phone]  weekends / holidays): Second Contact Pager: [phone]   Chief Complaint: Dizziness  History of Present Illness: 46 Y O F with PMH of DM- Uncontrolled, HTN, Carotid artery stenosis, Chronic mesenteric ischemia. Presented to they Ed with complaints of  dizziness of one day. Pt was in her usual state of health, until yesterday when she suddenly did not feel too well, was grocery shopping and went to the bathroom, and then realized she had had a bowel movement which was loose and black. She called her sister for help , was dizzy, finally got home and had several more black watery bowel movements. She denies seeing blood in her stools. She had one episode of vomiting yesterday that was not bloody. Dizziness is present only with standing.  She patient is complaint with aspirin- She says she takes a baby aspirin, Also takes Plavix. She denies taking any NSAIDs. She has never had a Colonoscopy though she has been told she needs one, she describes having procedure for like an EGD but she says it was when she was managed for chronic mesenteric ischemia. She has never had similar episodes of dizziness. She denies contact with anyone with diarrhea, and recent antibiotic use. She took a tablet of imodium yesterday and she cannot remember having any bowel movement since then.  She she was supposed to have cataract surgery yesterday morning that was deferred because her CBGs- 400s. She denies polyuria or polydipsia. She called her PCPs office and the nurse informed her to take another 5-10U of  lantus, this am her CBG- 130s.  She denies chest pain, prior hx of dizziness and is complaint with all her meds.  In the Ed she was found to have a Mild AKI and also noted that her Hgb was 8 from her baseline that has fluctuanted between 9-12 baseline.  Meds: No current facility-administered medications for this encounter.   Current Outpatient Prescriptions  Medication Sig Dispense Refill  . acetaminophen (TYLENOL) 650 MG CR tablet Take 650 mg by mouth every 8 (eight) hours as needed for pain.    Marland Kitchen aspirin 325 MG tablet Take 1 tablet (325 mg total) by mouth daily.    Marland Kitchen atorvastatin (LIPITOR) 20 MG tablet Take 1 tablet (20 mg total) by mouth daily. 30 tablet 5  . benazepril (LOTENSIN) 5 MG tablet TAKE 2 TABLETS BY MOUTH EVERY MORNING. 60 tablet 3  . clopidogrel (PLAVIX) 75 MG tablet Take 1 tablet (75 mg total) by mouth daily with breakfast. 30 tablet 3  . cyclobenzaprine (FLEXERIL) 5 MG tablet Take 1 tablet (5 mg total) by mouth at bedtime. 30 tablet 3  . diclofenac sodium (VOLTAREN) 1 % GEL APPLY 4 GRAMS TOPICALLY 4 TIMES DAILY. 100 g 2  . famotidine (PEPCID) 20 MG tablet TAKE 1 TABLET BY MOUTH 2 TIMES DAILY. 60 tablet 3  . gabapentin (NEURONTIN) 300 MG capsule Take 1 capsule (300 mg total)  by mouth 4 (four) times daily. 90 capsule 3  . glipiZIDE (GLUCOTROL) 10 MG tablet TAKE 1 TABLET BY MOUTH 2 TIMES DAILY BEFORE A MEAL. 60 tablet 3  . insulin glargine (LANTUS) 100 UNIT/ML injection Inject 0.65 mLs (65 Units total) into the skin at bedtime. 30 mL 3  . ketorolac (ACULAR) 0.4 % SOLN Place 1 drop into the left eye 4 (four) times daily.    Marland Kitchen loratadine (CLARITIN) 10 MG tablet Take 1 tablet (10 mg total) by mouth daily as needed for allergies or rhinitis. 30 tablet 11  . metFORMIN (GLUMETZA) 500 MG (MOD) 24 hr tablet Take 1 tablet (500 mg total) by mouth 2 (two) times daily with a meal. 60 tablet 2  . ofloxacin (OCUFLOX) 0.3 % ophthalmic solution Place 1 drop into the left eye 4 (four) times  daily.    Marland Kitchen oxymetazoline (AFRIN NASAL SPRAY) 0.05 % nasal spray Place 1 spray into both nostrils 2 (two) times daily as needed for congestion. 30 mL 0  . prednisoLONE acetate (PRED FORTE) 1 % ophthalmic suspension Place 1 drop into the left eye 4 (four) times daily.    . Vitamin D, Ergocalciferol, (DRISDOL) 50000 UNITS CAPS capsule Take 50,000 Units by mouth every 7 (seven) days. sunday    . glucose blood (ACCU-CHEK AVIVA) test strip Use as instructed 100 each 12  . Lancets (ACCU-CHEK MULTICLIX) lancets Use as instructed 100 each 12  . metFORMIN (GLUCOPHAGE) 1000 MG tablet TAKE 1 TABLET BY MOUTH 2 TIMES DAILY WITH A MEAL. (Patient not taking: Reported on 02/11/2015) 60 tablet 3  . [DISCONTINUED] colchicine 0.6 MG tablet Take two tab PO, then take 1 tab PO 1 hour later 6 tablet 0    Allergies: Allergies as of 02/11/2015  . (No Known Allergies)   Past Medical History  Diagnosis Date  . Staph infection   . Restless leg syndrome   . Chronic mesenteric ischemia     s/p SMA stent 06/22/13  . Coronary artery disease   . Hypertension   . Hyperlipidemia   . Diabetes mellitus     Type  II   Past Surgical History  Procedure Laterality Date  . Cholecystectomy    . Abdominal hysterectomy    . Coronary angioplasty      ? LCX stent ~ 2000 in [place], [place] with LCX stent [facility] 09/1999 for reported re-instent stenosis  . Endarterectomy Right 07/19/2013    Procedure: ENDARTERECTOMY CAROTID-RIGHT;  Surgeon: Angelia Mould, MD;  Location: [place];  Service: Vascular;  Laterality: Right;  . Carotid endarterectomy Right 07-19-13  . Insertion of retrograde carotid stent Left 03/05/2014    Procedure: 1)Cutdown of open exposure left common carotid; 2) Retrograde left common carotid stent; 3) Repair of left common carotid artery.;  Surgeon: Elam Dutch, MD;  Location: [place];  Service: Vascular;  Laterality: Left;  . Arch aortogram N/A 02/11/2014    Procedure: ARCH AORTOGRAM;  Surgeon: Angelia Mould, MD;  Location: [facility] CATH LAB;  Service: Cardiovascular;  Laterality: N/A;  . Left heart catheterization with coronary angiogram N/A 10/03/2014    Procedure: LEFT HEART CATHETERIZATION WITH CORONARY ANGIOGRAM;  Surgeon: Burnell Blanks, MD;  Location: [facility] C[facility] LAB;  Service: Cardiovascular;  Laterality: N/A;   Family History  Problem Relation Age of Onset  . Diabetes Mother   . Stroke Mother   . Hyperlipidemia Mother   . Cancer Father   . Heart disease Father     PVD and  CAROTID  . Heart attack Father   . Diabetes Sister   . Hypertension Sister   . Heart disease Sister     Before age 32  . Hyperlipidemia Sister   . Varicose Veins Sister   . Cancer Brother   . Cancer Maternal Aunt     breast cancer   . Diabetes Maternal Grandmother   . Diabetes Paternal Grandmother    History   Social History  . Marital Status: Divorced    Spouse Name: N/A  . Number of Children: N/A  . Years of Education: N/A   Occupational History  . Not on file.   Social History Main Topics  . Smoking status: Former Smoker -- 1.00 packs/day for 20 years    Quit date: 06/21/2013  . Smokeless tobacco: Never Used  . Alcohol Use: No  . Drug Use: No  . Sexual Activity: No   Other Topics Concern  . Not on file   Social History Narrative    Review of Systems: CONSTITUTIONAL- No Fever, change in appetite. SKIN- No Rash, colour changes or itching. Mouth/throat- Wears dentures, no moth ulcers. RESPIRATORY- Chronic cough from prior cig use, no SOB. CARDIAC- No chest pain. URINARY- No Frequency, urgency, straining or dysuria. NEUROLOGIC- No Numbness, or burning, or extremity weakness PYSCH- Denies depression or anxiety.  Physical Exam: Blood pressure 100/73, pulse 76, temperature 97.9 F (36.6 C), temperature source Oral, resp. rate 14, height 5\' 3"  (1.6 m), weight 177 lb (80.287 kg), SpO2 100 %. GENERAL- alert, co-operative, appears as stated age, not in any distress. HEENT-  Atraumatic, normocephalic, pupil- right larger than left but reactive, left has had cataract surgey,  CARDIAC- 3/6 systolic murmur heard in aortic and in pulmonary area, no murmurs, rubs or gallops. RESP- Moving equal volumes of air, and clear to auscultation bilaterally, no wheezes or crackles. ABDOMEN- Soft, nontender, no guarding or rebound, no palpable masses or organomegaly, bowel sounds present. BACK- Normal curvature of the spine, mild tenderness lower vertebrae with slight discoluration, no CVA tenderness. NEURO- No obvious Cr N abnormality, strenght upper and lower extremities- 5/5, Sensation intact- globally, DTRs- Normal, finger to nose test normal bilat, rapid alternating movement- intact, Gait- Normal. EXTREMITIES- pulse 2+, symmetric, no pedal edema. SKIN- Warm, dry, No rash or lesion. PSYCH- Normal mood and affect, appropriate thought content and speech.  Lab results: Basic Metabolic Panel:  Recent Labs  02/11/15 0934  NA 137  K 3.9  CL 104  CO2 25  GLUCOSE 308*  BUN 22*  CREATININE 1.15*  CALCIUM 8.7*   CBC:  Recent Labs  02/11/15 0934  WBC 7.6  HGB 8.7*  HCT 28.8*  MCV 75.0*  PLT 337   Other results: EKG: Rate- 80bpm, regular sinus, PR- 126, Qtc- 468, no ST abnormalities, left axis deviation, T wave inversion in 1 and AVL which are old, incomplete left BBB .  Assessment & Plan by Problem: Principal Problem:   Acute blood loss anemia Active Problems:   DM (diabetes mellitus)   Essential hypertension, benign   Carotid bruit   Chronic mesenteric ischemia   Chronic low back pain   Near syncope  Acute Blood loss Anemia- Most likely cause of her dizziness, Likely from upper GI bleed considering hx of melanotic stools. No EGD on file. Likely aggravated by Aspirin and plavix use for Carotid artery stenosis with stent placement- 02/2015. No hx of NSAID use. Hgb- 8.7 today. Was 10.4 last check- 3 months ago, but appears to fluctuatate between 10-12  over the  past year. FOBTs attempted in  the ED- No stool in rectum. BUN-22, not markedly elevated considering GI bleed. Liver enzymes in the past have been normal, therefore doubt Oesophageal varices.  She has been complaint with pepcid for GERD which helps. - Admit to Med surg - IVF- N/s 125cc/hr for 24 hrs - Clear liquid diet - CBC Q12H - INR- PT - Protonix 40mg  Q12H - FOBT - Repeat lactic acid- initial check in ED slightly elevated at 2.8 - Consider GI consult if Hgb drops for possible EGD, and colonoscopy as pt is past due for one. - Type and match 2 Units. - Hold aspirin and plavix  AKI-  likley due to dehydration with hyperglycemia and acute blood loss. - Hydrate- N/s  - CMet am  Grade 2 Diastolic dysfunction- Last Echo- 09/2014. Ef- 85-02%, Grade 2 Diastolic dysfunction. On benazepril. Appears dehydrated, no sign of fluid overload. Hydrate for now.  - Monitor fluids.  Carotid artery stenosis and mesenteric ischemia- Both stented. Carotids- 02/2014, SMA- 06/2013. On plavix and aspirin for over a year.  - Hold antiplatelets for now  Diabetes- Uncontrolled. Home meds-  Metformin 1000mg  BID and Lantus 65 units daily. - SSI-s - Lantus 25u daily   HTN- Bp- 100/70s. Home meds- benazepril- 10mg  daily. - Hold for now as pt has a slight AKI.  Dispo: Disposition is deferred at this time, awaiting improvement of current medical problems.   The patient does have a current PCP (Lorayne Marek, MD) and does need an [facility] hospital follow-up appointment after discharge.  The patient does not know have transportation limitations that hinder transportation to clinic appointments.  Signed: Bethena Roys, MD 02/11/2015, 11:55 AM

## 2015-02-11 NOTE — ED Notes (Signed)
Attempted report 

## 2015-02-11 NOTE — ED Notes (Signed)
Pt to department via EMS from home- pt reports diarrhea for about 24 hours and weakness. Pt reports hx of the same. Pt was orthostatic on EMS arrival. Bp-110/60 90/60 given 500cc NS. 4mg  Zofran. Hr-108 Left forearm 18g.

## 2015-02-11 NOTE — ED Notes (Signed)
i stat cg4 labs 2.81mmol,<> shown to dr.walden

## 2015-02-11 NOTE — ED Provider Notes (Signed)
CSN: [id number]     Arrival date & time 02/11/15  7096 History   First MD Initiated Contact with Patient 02/11/15 [phone]     Chief Complaint  Patient presents with  . Near Syncope     (Consider location/radiation/quality/duration/timing/severity/associated sxs/prior Treatment) HPI Comments: Patient here for near-syncope. Today she was going to see her PCP for elevated sugars. She had her cataract surgery cancelled yesterday due to hyperglycemia. Patient was headed to her PCP and she had multiple episodes of black diarrhea. Hx of stents in her stomach previously.   Patient is a 64 y.o. female presenting with near-syncope. The history is provided by the patient.  Near Syncope This is a new problem. The current episode started 3 to 5 hours ago. The problem occurs constantly. The problem has not changed since onset.Pertinent negatives include no shortness of breath. Nothing aggravates the symptoms. Nothing relieves the symptoms.    Past Medical History  Diagnosis Date  . Staph infection   . Restless leg syndrome   . Chronic mesenteric ischemia     s/p SMA stent 06/22/13  . Coronary artery disease   . Hypertension   . Hyperlipidemia   . Diabetes mellitus     Type  II   Past Surgical History  Procedure Laterality Date  . Cholecystectomy    . Abdominal hysterectomy    . Coronary angioplasty      ? LCX stent ~ 2000 in [place], [place] with LCX stent [facility] 09/1999 for reported re-instent stenosis  . Endarterectomy Right 07/19/2013    Procedure: ENDARTERECTOMY CAROTID-RIGHT;  Surgeon: Angelia Mould, MD;  Location: [place];  Service: Vascular;  Laterality: Right;  . Carotid endarterectomy Right 07-19-13  . Insertion of retrograde carotid stent Left 03/05/2014    Procedure: 1)Cutdown of open exposure left common carotid; 2) Retrograde left common carotid stent; 3) Repair of left common carotid artery.;  Surgeon: Elam Dutch, MD;  Location: [place];  Service: Vascular;  Laterality: Left;  .  Arch aortogram N/A 02/11/2014    Procedure: ARCH AORTOGRAM;  Surgeon: Angelia Mould, MD;  Location: [facility] CATH LAB;  Service: Cardiovascular;  Laterality: N/A;  . Left heart catheterization with coronary angiogram N/A 10/03/2014    Procedure: LEFT HEART CATHETERIZATION WITH CORONARY ANGIOGRAM;  Surgeon: Burnell Blanks, MD;  Location: [facility] CATH LAB;  Service: Cardiovascular;  Laterality: N/A;   Family History  Problem Relation Age of Onset  . Diabetes Mother   . Stroke Mother   . Hyperlipidemia Mother   . Cancer Father   . Heart disease Father     PVD and  CAROTID  . Heart attack Father   . Diabetes Sister   . Hypertension Sister   . Heart disease Sister     Before age 2  . Hyperlipidemia Sister   . Varicose Veins Sister   . Cancer Brother   . Cancer Maternal Aunt     breast cancer   . Diabetes Maternal Grandmother   . Diabetes Paternal Grandmother    History  Substance Use Topics  . Smoking status: Former Smoker -- 1.00 packs/day for 20 years    Quit date: 06/21/2013  . Smokeless tobacco: Never Used  . Alcohol Use: No   OB History    No data available     Review of Systems  Constitutional: Negative for fever.  Respiratory: Negative for cough and shortness of breath.   Cardiovascular: Positive for near-syncope.  Gastrointestinal: Negative for vomiting.  All other systems  reviewed and are negative.     Allergies  Review of patient's allergies indicates no known allergies.  Home Medications   Prior to Admission medications   Medication Sig Start Date End Date Taking? Authorizing Provider  acetaminophen (TYLENOL) 650 MG CR tablet Take 650 mg by mouth every 8 (eight) hours as needed for pain.    Historical Provider, MD  aspirin 325 MG tablet Take 1 tablet (325 mg total) by mouth daily. 06/25/13   Donne Hazel, MD  atorvastatin (LIPITOR) 20 MG tablet Take 1 tablet (20 mg total) by mouth daily. 08/13/14   Lorayne Marek, MD  benazepril (LOTENSIN) 5 MG tablet  TAKE 2 TABLETS BY MOUTH EVERY MORNING. 12/16/14   Lorayne Marek, MD  clopidogrel (PLAVIX) 75 MG tablet Take 1 tablet (75 mg total) by mouth daily with breakfast. 12/16/14   Lorayne Marek, MD  cyclobenzaprine (FLEXERIL) 5 MG tablet Take 1 tablet (5 mg total) by mouth at bedtime. 12/16/14   Lorayne Marek, MD  diclofenac sodium (VOLTAREN) 1 % GEL APPLY 4 GRAMS TOPICALLY 4 TIMES DAILY. 12/16/14   Lorayne Marek, MD  famotidine (PEPCID) 20 MG tablet TAKE 1 TABLET BY MOUTH 2 TIMES DAILY. 12/16/14   Lorayne Marek, MD  gabapentin (NEURONTIN) 300 MG capsule Take 1 capsule (300 mg total) by mouth 4 (four) times daily. 12/30/14   Trula Slade, DPM  glipiZIDE (GLUCOTROL) 10 MG tablet TAKE 1 TABLET BY MOUTH 2 TIMES DAILY BEFORE A MEAL. 12/16/14   Lorayne Marek, MD  glucose blood (ACCU-CHEK AVIVA) test strip Use as instructed 06/19/14   Lorayne Marek, MD  insulin glargine (LANTUS) 100 UNIT/ML injection Inject 0.65 mLs (65 Units total) into the skin at bedtime. 08/13/14   Lorayne Marek, MD  Lancets (ACCU-CHEK MULTICLIX) lancets Use as instructed 06/19/14   Lorayne Marek, MD  loratadine (CLARITIN) 10 MG tablet Take 1 tablet (10 mg total) by mouth daily as needed for allergies or rhinitis. 01/22/14   Lorayne Marek, MD  metFORMIN (GLUCOPHAGE) 1000 MG tablet TAKE 1 TABLET BY MOUTH 2 TIMES DAILY WITH A MEAL. 12/16/14   Lorayne Marek, MD  metFORMIN (GLUMETZA) 500 MG (MOD) 24 hr tablet Take 1 tablet (500 mg total) by mouth 2 (two) times daily with a meal. 01/22/14   Lorayne Marek, MD  oxymetazoline (AFRIN NASAL SPRAY) 0.05 % nasal spray Place 1 spray into both nostrils 2 (two) times daily as needed for congestion. 01/22/14   Lorayne Marek, MD  Vitamin D, Ergocalciferol, (DRISDOL) 50000 UNITS CAPS capsule Take 50,000 Units by mouth every 7 (seven) days. sunday    Historical Provider, MD   BP 111/53 mmHg  Pulse 80  Temp(Src) 98.1 F (36.7 C) (Oral)  Resp 18  Ht 5\' 3"  (1.6 m)  Wt 177 lb (80.287 kg)  BMI 31.36 kg/m2  SpO2  100% Physical Exam  Constitutional: She is oriented to person, place, and time. She appears well-developed and well-nourished. No distress.  HENT:  Head: Normocephalic and atraumatic.  Mouth/Throat: Oropharynx is clear and moist.  Eyes: EOM are normal. Pupils are equal, round, and reactive to light.  Neck: Normal range of motion. Neck supple.  Cardiovascular: Normal rate and regular rhythm.  Exam reveals no friction rub.   No murmur heard. Pulmonary/Chest: Effort normal and breath sounds normal. No respiratory distress. She has no wheezes. She has no rales.  Abdominal: Soft. She exhibits no distension. There is no tenderness. There is no rebound.  Musculoskeletal: Normal range of motion. She exhibits no edema.  Neurological: She is alert and oriented to person, place, and time.  Skin: She is not diaphoretic.  Nursing note and vitals reviewed.   ED Course  Procedures (including critical care time) Labs Review Labs Reviewed  CBC  BASIC METABOLIC PANEL  POC OCCULT BLOOD, ED  I-STAT CG4 LACTIC ACID, ED  I-STAT TROPOININ, ED    Imaging Review No results found.   EKG Interpretation   Date/Time:  Tuesday February 11 2015 08:53:22 EDT Ventricular Rate:  80 PR Interval:  126 QRS Duration: 98 QT Interval:  406 QTC Calculation: 468 R Axis:   -10 Text Interpretation:  Normal sinus rhythm T wave abnormality, consider  lateral ischemia Abnormal ECG No significant change since last tracing  Confirmed by Mingo Amber  MD, [place] ([id number]) on 02/11/2015 9:00:09 AM      MDM   Final diagnoses:  Near syncope    73F here with near-syncope. Multiple episodes of black diarrhea today. No hx of GI bleeds previously. Extensive cardiac history - last cath 5 months ago, no stents placed, had some mild obstructive disease and a patent stent, no interventions taken at that time. AFVSS here. Was allegedly orthostatic with EMS en route and given 500 cc of fluid. No stool in the vault, so none available for  hemoccult testing, so hemoccult negative when the lab checked. Will check labs and hydrate. Labs show hemoglobin drop from mid 10s to 8.7. Plan on admission.  Evelina Bucy, MD 02/12/15 [phone]

## 2015-02-12 LAB — COMPREHENSIVE METABOLIC PANEL
ALT: 10 U/L — ABNORMAL LOW (ref 14–54)
AST: 15 U/L (ref 15–41)
Albumin: 3.2 g/dL — ABNORMAL LOW (ref 3.5–5.0)
Alkaline Phosphatase: 66 U/L (ref 38–126)
Anion gap: 5 (ref 5–15)
BUN: 10 mg/dL (ref 6–20)
CHLORIDE: 110 mmol/L (ref 101–111)
CO2: 25 mmol/L (ref 22–32)
Calcium: 8.6 mg/dL — ABNORMAL LOW (ref 8.9–10.3)
Creatinine, Ser: 0.57 mg/dL (ref 0.44–1.00)
GFR calc non Af Amer: >60 mL/min (ref >60)
GLUCOSE: 53 mg/dL — AB (ref 65–99)
Potassium: 3.8 mmol/L (ref 3.5–5.1)
SODIUM: 140 mmol/L (ref 135–145)
TOTAL PROTEIN: 5.4 g/dL — AB (ref 6.5–8.1)
Total Bilirubin: 0.4 mg/dL (ref 0.3–1.2)

## 2015-02-12 LAB — CBC
HCT: 26.7 % — ABNORMAL LOW (ref 36.0–46.0)
HCT: 27.8 % — ABNORMAL LOW (ref 36.0–46.0)
HEMOGLOBIN: 8.2 g/dL — AB (ref 12.0–15.0)
HEMOGLOBIN: 8.3 g/dL — AB (ref 12.0–15.0)
MCH: 23.4 pg — ABNORMAL LOW (ref 26.0–34.0)
MCH: 22.6 pg — ABNORMAL LOW (ref 26.0–34.0)
MCHC: 30.7 g/dL (ref 30.0–36.0)
MCHC: 29.9 g/dL — AB (ref 30.0–36.0)
MCV: 76.3 fL — ABNORMAL LOW (ref 78.0–100.0)
MCV: 75.5 fL — ABNORMAL LOW (ref 78.0–100.0)
PLATELETS: 318 10*3/uL (ref 150–400)
PLATELETS: 308 10*3/uL (ref 150–400)
RBC: 3.5 MIL/uL — ABNORMAL LOW (ref 3.87–5.11)
RBC: 3.68 MIL/uL — AB (ref 3.87–5.11)
RDW: 17.4 % — ABNORMAL HIGH (ref 11.5–15.5)
RDW: 17.1 % — AB (ref 11.5–15.5)
WBC: 7.9 10*3/uL (ref 4.0–10.5)
WBC: 5.5 10*3/uL (ref 4.0–10.5)

## 2015-02-12 LAB — GLUCOSE, CAPILLARY
GLUCOSE-CAPILLARY: 64 mg/dL — AB (ref 65–99)
GLUCOSE-CAPILLARY: 68 mg/dL (ref 65–99)
GLUCOSE-CAPILLARY: 211 mg/dL — AB (ref 65–99)
Glucose-Capillary: 116 mg/dL — ABNORMAL HIGH (ref 65–99)
Glucose-Capillary: 145 mg/dL — ABNORMAL HIGH (ref 65–99)
Glucose-Capillary: 154 mg/dL — ABNORMAL HIGH (ref 65–99)
Glucose-Capillary: 178 mg/dL — ABNORMAL HIGH (ref 65–99)

## 2015-02-12 MED ORDER — SODIUM CHLORIDE 0.9 % IV SOLN
INTRAVENOUS | Status: DC
  Administered 2015-02-12: 22:00:00 via INTRAVENOUS

## 2015-02-12 MED ORDER — GLUCOSE 40 % PO GEL
ORAL | Status: AC
  Administered 2015-02-12: 37.5 g
  Filled 2015-02-12: qty 1

## 2015-02-12 MED ORDER — INSULIN GLARGINE 100 UNIT/ML ~~LOC~~ SOLN
20.0000 [IU] | Freq: Every day | SUBCUTANEOUS | Status: DC
  Filled 2015-02-12: qty 0.2

## 2015-02-12 MED ORDER — ACETAMINOPHEN 325 MG PO TABS
650.0000 mg | ORAL_TABLET | Freq: Once | ORAL | Status: AC
  Administered 2015-02-12: 650 mg via ORAL
  Filled 2015-02-12: qty 2

## 2015-02-12 MED ORDER — ACETAMINOPHEN 500 MG PO TABS
500.0000 mg | ORAL_TABLET | Freq: Four times a day (QID) | ORAL | Status: DC | PRN
  Administered 2015-02-12 – 2015-02-14 (×3): 500 mg via ORAL
  Filled 2015-02-12 (×3): qty 1

## 2015-02-12 NOTE — Consult Note (Signed)
[facility] Consultation Note  Referring Provider:  Dr. Murriel Hopper (Medical Teaching Service) Primary Care Physician:  Lorayne Marek, MD  Reason for Consultation:  Melena, anemia  HPI: Sarah Alvarez is a 64 y.o. female whom we've been asked to see for above reason.  Couple days ago, patient with progressive dizziness, actually passed out while out shopping.  Recent right cataract surgery, left postponed due to poor glycemic control.  Couple days of black diarrheal stools as well.  Mild lower abdominal crampy pains, decreased after defecation.  No bowel movement since yesterday.  Isolated non-bloody emesis x couple days ago.  No dysphagia.  History of mesenteric ischemia with what sounds like mesenteric stent placement a couple years ago.  On chronic aspirin and clopdigirel.  She is not certain whether she's ever had an endoscopy or colonoscopy.  Weight loss about 10 lbs over the past few weeks.   Past Medical History  Diagnosis Date  . Staph infection   . Restless leg syndrome   . Chronic mesenteric ischemia     s/p SMA stent 06/22/13  . Coronary artery disease   . Hypertension   . Hyperlipidemia   . Heart murmur   . Asthma     "mild touch" (02/11/2015)  . Type II diabetes mellitus   . Anemia   . History of blood transfusion     "getting one today for my low HgB" (82/2016)  . GERD (gastroesophageal reflux disease)   . Headache     "weekly" (02/11/2015)  . Arthritis     "hands" (02/11/2015)  . Chronic lower back pain   . Diabetic peripheral neuropathy     "in my legs" (02/11/2015)    Past Surgical History  Procedure Laterality Date  . Endarterectomy Right 07/19/2013    Procedure: ENDARTERECTOMY CAROTID-RIGHT;  Surgeon: Angelia Mould, MD;  Location: [facility];  Service: Vascular;  Laterality: Right;  . Insertion of retrograde carotid stent Left 03/05/2014    Procedure: 1)Cutdown of open exposure left common carotid; 2) Retrograde left common carotid stent; 3)  Repair of left common carotid artery.;  Surgeon: Elam Dutch, MD;  Location: [facility];  Service: Vascular;  Laterality: Left;  . Arch aortogram N/A 02/11/2014    Procedure: ARCH AORTOGRAM;  Surgeon: Angelia Mould, MD;  Location: [facility] CATH LAB;  Service: Cardiovascular;  Laterality: N/A;  . Left heart catheterization with coronary angiogram N/A 10/03/2014    Procedure: LEFT HEART CATHETERIZATION WITH CORONARY ANGIOGRAM;  Surgeon: Burnell Blanks, MD;  Location: [facility] CATH LAB;  Service: Cardiovascular;  Laterality: N/A;  . Cataract extraction w/ intraocular lens implant Left 02/03/2015  . Tonsillectomy  1950's  . Laparoscopic cholecystectomy    . Finger fracture surgery Left 2015    "they had to rebreak it and put pins in it"  . Fracture surgery    . Abdominal hysterectomy    . Coronary angioplasty      ? LCX stent ~ 2000 in [place], [place] with LCX stent [facility] 09/1999 for reported re-instent stenosis    Prior to Admission medications   Medication Sig Start Date End Date Taking? Authorizing Provider  acetaminophen (TYLENOL) 650 MG CR tablet Take 650 mg by mouth every 8 (eight) hours as needed for pain.   Yes Historical Provider, MD  aspirin 325 MG tablet Take 1 tablet (325 mg total) by mouth daily. 06/25/13  Yes Donne Hazel, MD  atorvastatin (LIPITOR) 20 MG tablet Take 1 tablet (20 mg total) by mouth  daily. 08/13/14  Yes Deepak Advani, MD  benazepril (LOTENSIN) 5 MG tablet TAKE 2 TABLETS BY MOUTH EVERY MORNING. 12/16/14  Yes Lorayne Marek, MD  clopidogrel (PLAVIX) 75 MG tablet Take 1 tablet (75 mg total) by mouth daily with breakfast. 12/16/14  Yes Deepak Advani, MD  cyclobenzaprine (FLEXERIL) 5 MG tablet Take 1 tablet (5 mg total) by mouth at bedtime. 12/16/14  Yes Deepak Advani, MD  diclofenac sodium (VOLTAREN) 1 % GEL APPLY 4 GRAMS TOPICALLY 4 TIMES DAILY. 12/16/14  Yes Lorayne Marek, MD  famotidine (PEPCID) 20 MG tablet TAKE 1 TABLET BY MOUTH 2 TIMES DAILY. 12/16/14  Yes Lorayne Marek, MD   gabapentin (NEURONTIN) 300 MG capsule Take 1 capsule (300 mg total) by mouth 4 (four) times daily. 12/30/14  Yes Trula Slade, DPM  glipiZIDE (GLUCOTROL) 10 MG tablet TAKE 1 TABLET BY MOUTH 2 TIMES DAILY BEFORE A MEAL. 12/16/14  Yes Deepak Advani, MD  insulin glargine (LANTUS) 100 UNIT/ML injection Inject 0.65 mLs (65 Units total) into the skin at bedtime. 08/13/14  Yes Lorayne Marek, MD  ketorolac (ACULAR) 0.4 % SOLN Place 1 drop into the left eye 4 (four) times daily.   Yes Historical Provider, MD  loratadine (CLARITIN) 10 MG tablet Take 1 tablet (10 mg total) by mouth daily as needed for allergies or rhinitis. 01/22/14  Yes Lorayne Marek, MD  metFORMIN (GLUMETZA) 500 MG (MOD) 24 hr tablet Take 1 tablet (500 mg total) by mouth 2 (two) times daily with a meal. 01/22/14  Yes Deepak Advani, MD  ofloxacin (OCUFLOX) 0.3 % ophthalmic solution Place 1 drop into the left eye 4 (four) times daily.   Yes Historical Provider, MD  oxymetazoline (AFRIN NASAL SPRAY) 0.05 % nasal spray Place 1 spray into both nostrils 2 (two) times daily as needed for congestion. 01/22/14  Yes Lorayne Marek, MD  prednisoLONE acetate (PRED FORTE) 1 % ophthalmic suspension Place 1 drop into the left eye 4 (four) times daily.   Yes Historical Provider, MD  Vitamin D, Ergocalciferol, (DRISDOL) 50000 UNITS CAPS capsule Take 50,000 Units by mouth every 7 (seven) days. sunday   Yes Historical Provider, MD  glucose blood (ACCU-CHEK AVIVA) test strip Use as instructed 06/19/14   Lorayne Marek, MD  Lancets (ACCU-CHEK MULTICLIX) lancets Use as instructed 06/19/14   Lorayne Marek, MD  metFORMIN (GLUCOPHAGE) 1000 MG tablet TAKE 1 TABLET BY MOUTH 2 TIMES DAILY WITH A MEAL. Patient not taking: Reported on 02/11/2015 12/16/14   Lorayne Marek, MD    Current Facility-Administered Medications  Medication Dose Route Frequency Provider Last Rate Last Dose  . 0.9 %  sodium chloride infusion   Intravenous Continuous Bethena Roys, MD 125 mL/hr at  02/11/15 1447    . 0.9 %  sodium chloride infusion   Intravenous Once Ejiroghene E Emokpae, MD      . acetaminophen (TYLENOL) tablet 500 mg  500 mg Oral Q6H PRN Loleta Chance, MD   500 mg at 02/12/15 1696  . atorvastatin (LIPITOR) tablet 20 mg  20 mg Oral Daily Ejiroghene Arlyce Dice, MD   20 mg at 02/12/15 0953  . diclofenac sodium (VOLTAREN) 1 % transdermal gel 4 g  4 g Topical QID Bethena Roys, MD   4 g at 02/12/15 0957  . feeding supplement (BOOST / RESOURCE BREEZE) liquid 1 Container  1 Container Oral TID BM Annia Belt, MD   1 Container at 02/12/15 1000  . gabapentin (NEURONTIN) capsule 300 mg  300 mg Oral QID  Zada Finders, MD   300 mg at 02/12/15 0953  . insulin aspart (novoLOG) injection 0-9 Units  0-9 Units Subcutaneous TID WC Ejiroghene Arlyce Dice, MD   3 Units at 02/12/15 1216  . insulin glargine (LANTUS) injection 20 Units  20 Units Subcutaneous QHS Lucious Groves, DO      . ketorolac (ACULAR) 0.5 % ophthalmic solution 1 drop  1 drop Left Eye QID Bethena Roys, MD   1 drop at 02/12/15 0955  . ofloxacin (OCUFLOX) 0.3 % ophthalmic solution 1 drop  1 drop Left Eye QID Bethena Roys, MD   1 drop at 02/12/15 0955  . pantoprazole (PROTONIX) injection 40 mg  40 mg Intravenous Q12H Ejiroghene E Denton Brick, MD   40 mg at 02/12/15 0953  . prednisoLONE acetate (PRED FORTE) 1 % ophthalmic suspension 1 drop  1 drop Left Eye QID Bethena Roys, MD   1 drop at 02/12/15 0954    Allergies as of 02/11/2015  . (No Known Allergies)    Family History  Problem Relation Age of Onset  . Diabetes Mother   . Stroke Mother   . Hyperlipidemia Mother   . Cancer Father   . Heart disease Father     PVD and  CAROTID  . Heart attack Father   . Diabetes Sister   . Hypertension Sister   . Heart disease Sister     Before age 31  . Hyperlipidemia Sister   . Varicose Veins Sister   . Cancer Brother   . Cancer Maternal Aunt     breast cancer   . Diabetes Maternal Grandmother    . Diabetes Paternal Grandmother     History   Social History  . Marital Status: Divorced    Spouse Name: N/A  . Number of Children: N/A  . Years of Education: N/A   Occupational History  . Not on file.   Social History Main Topics  . Smoking status: Former Smoker -- 1.00 packs/day for 20 years    Types: Cigarettes    Quit date: 06/21/2013  . Smokeless tobacco: Never Used  . Alcohol Use: No  . Drug Use: No  . Sexual Activity: No   Other Topics Concern  . Not on file   Social History Narrative    Review of Systems: ROS Dr. Denton Brick 02/11/15 reviewed and I agree  Physical Exam: Vital signs in last 24 hours: Temp:  [98 F (36.7 C)-98.2 F (36.8 C)] 98.2 F (36.8 C) (08/03 0527) Pulse Rate:  [76-80] 76 (08/03 0527) Resp:  [18] 18 (08/03 0527) BP: (109-113)/(65-68) 109/68 mmHg (08/03 0527) SpO2:  [100 %] 100 % (08/03 0527) Weight:  [79.914 kg (176 lb 2.9 oz)-80.8 kg (178 lb 2.1 oz)] 79.914 kg (176 lb 2.9 oz) (08/03 0527) Last BM Date: 02/11/15 General:   Alert,  Older-appearing than stated age, Well-developed, well-nourished, pleasant and cooperative in NAD Head:  Normocephalic and atraumatic. Eyes:  Sclera clear, no icterus.   Conjunctiva pink. Ears:  Normal auditory acuity. Nose:  No deformity, discharge,  or lesions. Mouth:  No deformity or lesions.  Oropharynx pink & moist. Neck:  Bruit ausculted left carotid; Supple; no masses or thyromegaly. Lungs:  Clear throughout to auscultation.   No wheezes, crackles, or rhonchi. No acute distress. Heart:  Regular rate and rhythm; II/VI LUSB murmur, clicks, rubs,  or gallops. Abdomen:  Soft, nontender and nondistended. No bruit ausculted; No masses, hepatosplenomegaly or hernias noted. Normal bowel sounds, without guarding, and without  rebound.     Msk:  Some extremity atrophy, otherwise symmetrical without gross deformities. Normal posture. Pulses:  Normal pulses noted. Extremities:  Without clubbing or edema. Neurologic:   Alert and  oriented x4; diffusely weak, otherwise grossly normal neurologically. Skin:  Scattered ecchymoses; otherwise Intact without significant lesions or rashes.. Psych:  Alert and cooperative. Normal mood and affect.   Lab Results:  Recent Labs  02/11/15 0934 02/11/15 1857 02/12/15 0615  WBC 7.6 7.2 7.9  HGB 8.7* 7.9* 8.2*  HCT 28.8* 26.0* 26.7*  PLT 337 303 318   BMET  Recent Labs  02/11/15 0934 02/12/15 0615  NA 137 140  K 3.9 3.8  CL 104 110  CO2 25 25  GLUCOSE 308* 53*  BUN 22* 10  CREATININE 1.15* 0.57  CALCIUM 8.7* 8.6*   LFT  Recent Labs  02/12/15 0615  PROT 5.4*  ALBUMIN 3.2*  AST 15  ALT 10*  ALKPHOS 66  BILITOT 0.4   PT/INR  Recent Labs  02/11/15 1857  LABPROT 14.1  INR 1.07    Studies/Results: No results found.  Impression:  1.  Melena. 2.  Acute blood loss anemia.  Microcytosis also suggestive of more chronic indolent bleed as well.  Plan:  1.  Hold aspirin and clopidigrel. 2.  PPI. 3.  Clear liquids, NPO after midnight. 4.  Endoscopy tomorrow; if endoscopy is unrevealing tomorrow, consider colonoscopy Friday. 5.  [facility] will follow.   LOS: 1 day   Nishika Parkhurst Jerilynn Mages  02/12/2015, 12:26 PM  Pager [phone] If no answer or after 5 PM call [phone]

## 2015-02-12 NOTE — Progress Notes (Signed)
Nutrition Brief Note  Patient identified on the Malnutrition Screening Tool (MST) Report  Wt Readings from Last 15 Encounters:  02/12/15 176 lb 2.9 oz (79.914 kg)  12/16/14 188 lb 12.8 oz (85.639 kg)  10/29/14 184 lb 3.2 oz (83.553 kg)  10/03/14 184 lb (83.462 kg)  09/26/14 181 lb (82.101 kg)  09/25/14 182 lb (82.555 kg)  09/16/14 182 lb 12.8 oz (82.918 kg)  08/13/14 168 lb 12.8 oz (76.567 kg)  07/23/14 160 lb (72.576 kg)  05/15/14 173 lb (78.472 kg)  05/13/14 169 lb 12.8 oz (77.021 kg)  05/02/14 165 lb (74.844 kg)  03/21/14 166 lb (75.297 kg)  03/20/14 167 lb 12.8 oz (76.114 kg)  03/05/14 169 lb 5 oz (76.8 kg)    Body mass index is 31.22 kg/(m^2). Patient meets criteria for Obesity based on current BMI. Pt reports having a good appetite and eating well PTA with 3 meals daily. She has lost 6% of her body weight in the past 2 months (non-significant). Pt appears well nourished, no muscle or fat wasting.   Current diet order is Clear Liquids, patient is consuming approximately 100% of meals at this time. Labs and medications reviewed.   No nutrition interventions warranted at this time. If nutrition issues arise, please consult RD.   Pryor Ochoa RD, LDN Inpatient Clinical Dietitian Pager: [phone] After Hours Pager: [phone]

## 2015-02-12 NOTE — Progress Notes (Signed)
Hypoglycemic Event  CBG: 64  Treatment: 1 tube instant glucose  Symptoms: None  Follow-up CBG: Time: 0728 CBG Result:154  Possible Reasons for Event: Inadequate meal intake  Comments/MD notified:Notified on-call doctor,Dr. Shelby Mattocks, Phyllip Claw  Remember to initiate Hypoglycemia Order Set & complete

## 2015-02-12 NOTE — Progress Notes (Signed)
CBG - 64 this morning and was notified by Nurse tech after she got her fingerstick. Instructed nurse tech to give 2 cups of orange juice. Patient is on a clear liquid diet and therefore not able to have a snack such as graham crackers. Oral gel glucagon was given per hypoglycemic protocol.CBG was rechecked and her blood sugar increased to 154. Patient is fine except for a nagging headache that she has had during the night in which the doctor was called to notify and request tylenol.for the patient. No other complaints. Will continue to monitor patient to end of the shift.

## 2015-02-12 NOTE — Progress Notes (Signed)
Subjective: Patient states that she has not had any bowel movement or bleeding since hospitalization. She is urinating She states that she has not had any dizziness as she is careful not to stand up quickly or walk on her own. She has been ambulating some with assistance. She also complains of some new epigastric pain. Objective: Vital signs in last 24 hours: Filed Vitals:   02/11/15 1215 02/11/15 1305 02/12/15 0214 02/12/15 0527  BP: 114/61 113/65 110/67 109/68  Pulse: 76 80 76 76  Temp:  98 F (36.7 C) 98 F (36.7 C) 98.2 F (36.8 C)  TempSrc:  Oral  Oral  Resp: 18 18 18 18   Height:  5\' 3"  (1.6 m)    Weight:  80.8 kg (178 lb 2.1 oz)  79.914 kg (176 lb 2.9 oz)  SpO2: 100% 100% 100% 100%   Weight change:   Intake/Output Summary (Last 24 hours) at 02/12/15 1104 Last data filed at 02/12/15 0958  Gross per 24 hour  Intake 2462.08 ml  Output   1450 ml  Net 1012.08 ml   General: resting in bed HEENT: EOMI, no scleral icterus Cardiac: RRR, systolic murmur heard over RUS and LUS borders Pulm: clear to auscultation bilaterally, moving normal volumes of air Abd: soft, some tenderness in epigastric region, nondistended, BS present Ext: warm and well perfused, no pedal edema Neuro: alert and oriented X3  Lab Results: Basic Metabolic Panel:  Recent Labs Lab 02/11/15 0934 02/12/15 0615  NA 137 140  K 3.9 3.8  CL 104 110  CO2 25 25  GLUCOSE 308* 53*  BUN 22* 10  CREATININE 1.15* 0.57  CALCIUM 8.7* 8.6*   Liver Function Tests:  Recent Labs Lab 02/12/15 0615  AST 15  ALT 10*  ALKPHOS 66  BILITOT 0.4  PROT 5.4*  ALBUMIN 3.2*   No results for input(s): LIPASE, AMYLASE in the last 168 hours. No results for input(s): AMMONIA in the last 168 hours. CBC:  Recent Labs Lab 02/11/15 1857 02/12/15 0615  WBC 7.2 7.9  HGB 7.9* 8.2*  HCT 26.0* 26.7*  MCV 75.8* 76.3*  PLT 303 318   Cardiac Enzymes: No results for input(s): CKTOTAL, CKMB, CKMBINDEX, TROPONINI in the  last 168 hours. BNP: No results for input(s): PROBNP in the last 168 hours. D-Dimer: No results for input(s): DDIMER in the last 168 hours. CBG:  Recent Labs Lab 02/11/15 1639 02/11/15 2057 02/12/15 0025 02/12/15 0636 02/12/15 0655 02/12/15 0758  GLUCAP 145* 106* 145* 68 64* 154*   Hemoglobin A1C: No results for input(s): HGBA1C in the last 168 hours. Fasting Lipid Panel: No results for input(s): CHOL, HDL, LDLCALC, TRIG, CHOLHDL, LDLDIRECT in the last 168 hours. Thyroid Function Tests: No results for input(s): TSH, T4TOTAL, FREET4, T3FREE, THYROIDAB in the last 168 hours. Coagulation:  Recent Labs Lab 02/11/15 1857  LABPROT 14.1  INR 1.07   Anemia Panel: No results for input(s): VITAMINB12, FOLATE, FERRITIN, TIBC, IRON, RETICCTPCT in the last 168 hours. Urine Drug Screen: Drugs of Abuse  No results found for: LABOPIA, COCAINSCRNUR, LABBENZ, AMPHETMU, THCU, LABBARB  Alcohol Level: No results for input(s): ETH in the last 168 hours. Urinalysis: No results for input(s): COLORURINE, LABSPEC, PHURINE, GLUCOSEU, HGBUR, BILIRUBINUR, KETONESUR, PROTEINUR, UROBILINOGEN, NITRITE, LEUKOCYTESUR in the last 168 hours.  Invalid input(s): APPERANCEUR  Micro Results: No results found for this or any previous visit (from the past 240 hour(s)). Studies/Results: No results found. Medications: I have reviewed the patient's current medications. Scheduled Meds: . sodium  chloride   Intravenous Once  . atorvastatin  20 mg Oral Daily  . diclofenac sodium  4 g Topical QID  . feeding supplement  1 Container Oral TID BM  . gabapentin  300 mg Oral QID  . insulin aspart  0-9 Units Subcutaneous TID WC  . insulin glargine  20 Units Subcutaneous QHS  . ketorolac  1 drop Left Eye QID  . ofloxacin  1 drop Left Eye QID  . pantoprazole (PROTONIX) IV  40 mg Intravenous Q12H  . prednisoLONE acetate  1 drop Left Eye QID   Continuous Infusions: . sodium chloride 125 mL/hr at 02/11/15 1447    PRN Meds:.acetaminophen Assessment/Plan: Principal Problem:   Acute blood loss anemia Active Problems:   DM (diabetes mellitus)   Essential hypertension, benign   Carotid bruit   Chronic mesenteric ischemia   Chronic low back pain   Near syncope  Acute blood loss anemia: Patient had two days history of melanotic stools she described as a mix of black and red color. No prior EGD on file. Bleed most likely 2/2 aspirin and plavix used for stent placed in carotid artery or coronary stent. She denies any bowel movements or episodes of bleeding since hospitalization. Her dizziness was likely due to her acute blood loss. This is most likely an upper GI bleed due to possible ulcer. Hgb up to 8.2 today compared to 7.9. -Clear liquid diet -Continue IV NS 125 mL/hr today -Continue Protonix 40 mg IV every 12 hours -GI consulted, EGD tomorrow, possible colonoscopy if EGD negative -Hold aspirin and plavix  AKI: likely 2/2 dehydration and acute blood loss. Corrected: Creatinine back to 0.57 compared to 1.15 on admission. -Continue IV fluids  Grade 2 diastolic dysfunction: Echo 09/2014 shows EF 60-65%, denies any chest pain, cough, SOB. -Monitor fluids  Carotid artery stenosis, mesenteric ischemia: Carotid stent 02/2014, SMA 06/2013. On aspirin, plavix over a year. -Hold both aspirin and plavix, spoke to cardiology and both can be stopped safely for now. If need to restart antiplatelet, may start plavix but not aspirin as that has higher risk for GI bleed  Diabetes: Had low of 65 in the am corrected with orange juice, glucagon -Decrease lantus to 20 Units -SSI  HTN: Benazepril held due to AKI during admission, BPs stable -Continue to hold benazepril    Dispo: Disposition is deferred at this time, awaiting improvement of current medical problems.  Anticipated discharge in approximately 2-3 day(s).   The patient does have a current PCP (Lorayne Marek, MD) and does not need an [facility] hospital  follow-up appointment after discharge.  The patient does not know have transportation limitations that hinder transportation to clinic appointments.  .Services Needed at time of discharge: Y = Yes, Blank = No PT:   OT:   RN:   Equipment:   Other:     LOS: 1 day   Zada Finders, MD 02/12/2015, 11:04 AM

## 2015-02-12 NOTE — Progress Notes (Signed)
Pt a/o, c/o HA this am PRN Tylenol given, pt on clears, plan for EGD 02/13/15, VSS, pt stable

## 2015-02-13 ENCOUNTER — Inpatient Hospital Stay (HOSPITAL_COMMUNITY): Payer: Medicaid Other | Admitting: Certified Registered"

## 2015-02-13 ENCOUNTER — Encounter (HOSPITAL_COMMUNITY)
Admission: EM | Disposition: A | Discharge status: Home / Self Care | Payer: Self-pay | Source: Home / Self Care | Attending: Oncology

## 2015-02-13 ENCOUNTER — Encounter (HOSPITAL_COMMUNITY): Payer: Self-pay | Admitting: Certified Registered"

## 2015-02-13 HISTORY — PX: ESOPHAGOGASTRODUODENOSCOPY (EGD) WITH PROPOFOL: SHX5813

## 2015-02-13 LAB — GLUCOSE, CAPILLARY
GLUCOSE-CAPILLARY: 117 mg/dL — AB (ref 65–99)
GLUCOSE-CAPILLARY: 78 mg/dL (ref 65–99)
GLUCOSE-CAPILLARY: 128 mg/dL — AB (ref 65–99)
GLUCOSE-CAPILLARY: 111 mg/dL — AB (ref 65–99)
Glucose-Capillary: 63 mg/dL — ABNORMAL LOW (ref 65–99)
Glucose-Capillary: 90 mg/dL (ref 65–99)
Glucose-Capillary: 279 mg/dL — ABNORMAL HIGH (ref 65–99)
Glucose-Capillary: 189 mg/dL — ABNORMAL HIGH (ref 65–99)

## 2015-02-13 SURGERY — ESOPHAGOGASTRODUODENOSCOPY (EGD) WITH PROPOFOL
Anesthesia: Monitor Anesthesia Care | Laterality: Left

## 2015-02-13 MED ORDER — LACTATED RINGERS IV SOLN
INTRAVENOUS | Status: DC | PRN
  Administered 2015-02-13: 10:00:00 via INTRAVENOUS

## 2015-02-13 MED ORDER — INSULIN GLARGINE 100 UNIT/ML ~~LOC~~ SOLN
10.0000 [IU] | Freq: Every day | SUBCUTANEOUS | Status: DC
  Filled 2015-02-13 (×2): qty 0.1

## 2015-02-13 MED ORDER — LACTATED RINGERS IV SOLN
Freq: Once | INTRAVENOUS | Status: AC
Start: 2015-02-13 — End: 2015-02-13
  Administered 2015-02-13: 1000 mL via INTRAVENOUS

## 2015-02-13 MED ORDER — LIDOCAINE HCL (CARDIAC) 20 MG/ML IV SOLN
INTRAVENOUS | Status: DC | PRN
  Administered 2015-02-13: 60 mg via INTRAVENOUS

## 2015-02-13 MED ORDER — DEXTROSE 50 % IV SOLN
25.0000 mL | Freq: Once | INTRAVENOUS | Status: AC
  Administered 2015-02-13: 25 mL via INTRAVENOUS

## 2015-02-13 MED ORDER — BUTAMBEN-TETRACAINE-BENZOCAINE 2-2-14 % EX AERO
INHALATION_SPRAY | CUTANEOUS | Status: DC | PRN
  Administered 2015-02-13: 2 via TOPICAL

## 2015-02-13 MED ORDER — PROPOFOL INFUSION 10 MG/ML OPTIME
INTRAVENOUS | Status: DC | PRN
  Administered 2015-02-13: 300 ug/kg/min via INTRAVENOUS

## 2015-02-13 MED ORDER — PEG 3350-KCL-NA BICARB-NACL 420 G PO SOLR
4000.0000 mL | Freq: Once | ORAL | Status: AC
  Administered 2015-02-13: 4000 mL via ORAL
  Filled 2015-02-13: qty 4000

## 2015-02-13 MED ORDER — SODIUM CHLORIDE 0.9 % IV SOLN
INTRAVENOUS | Status: DC
  Administered 2015-02-13: 1000 mL via INTRAVENOUS

## 2015-02-13 MED ORDER — PANTOPRAZOLE SODIUM 40 MG PO TBEC
40.0000 mg | DELAYED_RELEASE_TABLET | Freq: Every day | ORAL | Status: DC
  Administered 2015-02-14: 40 mg via ORAL
  Filled 2015-02-13: qty 1

## 2015-02-13 MED ORDER — FENTANYL CITRATE (PF) 100 MCG/2ML IJ SOLN: 25.0000 ug | INTRAMUSCULAR | Status: DC | PRN

## 2015-02-13 MED ORDER — DEXTROSE 50 % IV SOLN
INTRAVENOUS | Status: AC
  Filled 2015-02-13: qty 50

## 2015-02-13 NOTE — Progress Notes (Signed)
Pt NPO for EGD this am.  Dr. Cristina Gong informed, asking  if he wants pt to have meds. With sips  of H20 before procedure.  MD instructed no, to keep pt NPO.  Karie Kirks, Therapist, sports.

## 2015-02-13 NOTE — Progress Notes (Signed)
[name] BP was 179/76, denies headache, denies nausea and vomiting, not in respiratory distress.  Dr. Juleen China was notified and no new orders given at this time.

## 2015-02-13 NOTE — Anesthesia Postprocedure Evaluation (Signed)
  Anesthesia Post-op Note  Patient: Sarah Alvarez  Procedure(s) Performed: Procedure(s): ESOPHAGOGASTRODUODENOSCOPY (EGD) WITH PROPOFOL (Left)  Patient Location: PACU  Anesthesia Type: MAC  Level of Consciousness: awake and alert   Airway and Oxygen Therapy: Patient Spontanous Breathing  Post-op Pain: Controlled  Post-op Assessment: Post-op Vital signs reviewed, Patient's Cardiovascular Status Stable and Respiratory Function Stable  Post-op Vital Signs: Reviewed  Filed Vitals:   02/13/15 1045  BP:   Pulse: 77  Temp: 36.6 C  Resp: 14    Complications: No apparent anesthesia complications

## 2015-02-13 NOTE — Anesthesia Preprocedure Evaluation (Addendum)
Anesthesia Evaluation  Patient identified by MRN, date of birth, ID band Patient awake    Reviewed: Allergy & Precautions, H&P , NPO status , Patient's Chart, lab work & pertinent test results  Airway Mallampati: II  TM Distance: >3 FB Neck ROM: Full    Dental no notable dental hx. (+) Upper Dentures, Lower Dentures, Dental Advisory Given   Pulmonary asthma , former smoker,  breath sounds clear to auscultation  Pulmonary exam normal       Cardiovascular hypertension, Pt. on medications + CAD and + Peripheral Vascular Disease + Valvular Problems/Murmurs Rhythm:Regular Rate:Normal     Neuro/Psych  Headaches, negative psych ROS   GI/Hepatic Neg liver ROS, GERD-  Medicated,  Endo/Other  diabetes, Type 2, Oral Hypoglycemic Agents  Renal/GU negative Renal ROS  negative genitourinary   Musculoskeletal  (+) Arthritis -, Osteoarthritis,    Abdominal   Peds  Hematology negative hematology ROS (+) anemia ,   Anesthesia Other Findings   Reproductive/Obstetrics negative OB ROS                            Anesthesia Physical Anesthesia Plan  ASA: III  Anesthesia Plan: MAC   Post-op Pain Management:    Induction: Intravenous  Airway Management Planned: Nasal Cannula  Additional Equipment:   Intra-op Plan:   Post-operative Plan:   Informed Consent: I have reviewed the patients History and Physical, chart, labs and discussed the procedure including the risks, benefits and alternatives for the proposed anesthesia with the patient or authorized representative who has indicated his/her understanding and acceptance.   Dental advisory given  Plan Discussed with: CRNA  Anesthesia Plan Comments:         Anesthesia Quick Evaluation

## 2015-02-13 NOTE — Op Note (Signed)
[facility] [place], [address]   ENDOSCOPY PROCEDURE REPORT  PATIENT: Sarah, Alvarez  MR#: [id number] BIRTHDATE: Aug 14, 1950 , 64  yrs. old GENDER: female ENDOSCOPIST:Emmaleah Meroney Marquette, MD REFERRED BY: med teaching service PROCEDURE DATE:  02/20/15 PROCEDURE:   upper endoscopy ASA CLASS:    per anesthesia INDICATIONS: anemia (stool heme negative, reportedly had melenic stool), on aspirin and Plavix MEDICATION: propofol per anesthesia TOPICAL ANESTHETIC:   unknown  DESCRIPTION OF PROCEDURE:   The patient was brought from her hospital room to the [facility] cone endoscopy unit. After the risks and benefits of the procedure were explained, informed consent was obtained.  The PENTAX GASTOROSCOPE [id number]  adultendoscope was introduced through the mouth  and advanced to the second portion of the duodenum .  The instrument was slowly withdrawn as the mucosa was fully examined. Estimated blood loss is zero unless otherwise noted in this procedure report.    The endoscope was passed under direct vision and entered the esophagus without significant difficulty.  The larynx was briefly seen and appeared normal.  The esophagus was normal in its entirety. No reflux esophagitis, Barrett's esophagus, varices, infection, neoplasia, Mallory-Weiss tear, ring, stricture, or hiatal hernia was observed.  The stomach was entered. It contained a small clear residual. No blood or coffee-ground material was seen. The gastric mucosa was normal in all areas. No aspirin induced gastropathy was observed. No gastritis, erosions, ulcers, polyps, or masses were observed anywhere in the stomach on careful examination. A retroflexed view of the cardia was normal.  The pylorus, duodenal bulb, and second duodenum looked normal. The duodenal bulb was carefully inspected and was free of any erosions or ulcers.  No biopsies were obtained.         The scope was then  withdrawn from the patient and the procedure completed.  COMPLICATIONS: There were no immediate complications.  ENDOSCOPIC IMPRESSION: 1. Normal endoscopic examination 2. No source of patient's anemia or reported melena was observed during this exam 3. Specifically, no evidence of gastropathy related to the patient's use of aspirin   RECOMMENDATIONS:  1. Okay to resume aspirin and Plavix 2. In view of ongoing aspirin and Plavix therapy, I would favor consideration of lifelong PPI prophylaxis (pantoprazole rather than omeprazole, because of theoretical possible interference of omeprazole with Plavix) to prevent future ulcer disease and GI bleeding, even though no gastritis or ulcers were observed at the present time 3. Consideration for colonoscopy or other forms of colon cancer screening, either prior to discharge or as an outpatient   _______________________________ eSigned:  Ronald Lobo, MD 2015-02-20 10:49 AM     cc:  CPT CODES: ICD CODES:  The ICD and CPT codes recommended by this software are interpretations from the data that the clinical staff has captured with the software.  The verification of the translation of this report to the ICD and CPT codes and modifiers is the sole responsibility of the health care institution and practicing physician where this report was generated.  [employer]. will not be held responsible for the validity of the ICD and CPT codes included on this report.  AMA assumes no liability for data contained or not contained herein. CPT is a Designer, television/film set of the [employer].  PATIENT NAME:  Sarah, Alvarez MR#: [id number]

## 2015-02-13 NOTE — Progress Notes (Signed)
Subjective: Patient continues to be NPO without any bowel movements. She states that she is feeling well other than some continued mild achy epigastric abdominal pain. She says that she had a low blood glucose this morning which was quickly corrected with dextrose fluids. No other changes overnight. Objective: Vital signs in last 24 hours: Filed Vitals:   02/12/15 0527 02/12/15 1506 02/12/15 2032 02/13/15 0558  BP: 109/68 119/59 112/64 107/49  Pulse: 76 81 71 77  Temp: 98.2 F (36.8 C) 98.1 F (36.7 C) 99.2 F (37.3 C) 98.9 F (37.2 C)  TempSrc: Oral Oral Oral Oral  Resp: 18 18 18 18   Height:      Weight: 79.914 kg (176 lb 2.9 oz)   82.056 kg (180 lb 14.4 oz)  SpO2: 100% 100% 100% 99%   Weight change: 1.769 kg (3 lb 14.4 oz)  Intake/Output Summary (Last 24 hours) at 02/13/15 0801 Last data filed at 02/13/15 0753  Gross per 24 hour  Intake   1737 ml  Output   3450 ml  Net  -1713 ml   General: resting in bed HEENT: EOMI, no scleral icterus Cardiac: RRR, systolic murmur heard over upper sternal borders Pulm: clear to auscultation bilaterally, moving normal volumes of air Abd: soft, mild tenderness in epigastric region, nondistended, BS present Ext: warm and well perfused, no pedal edema Neuro: alert and oriented X3  Lab Results: Basic Metabolic Panel:  Recent Labs Lab 02/11/15 0934 02/12/15 0615  NA 137 140  K 3.9 3.8  CL 104 110  CO2 25 25  GLUCOSE 308* 53*  BUN 22* 10  CREATININE 1.15* 0.57  CALCIUM 8.7* 8.6*   Liver Function Tests:  Recent Labs Lab 02/12/15 0615  AST 15  ALT 10*  ALKPHOS 66  BILITOT 0.4  PROT 5.4*  ALBUMIN 3.2*   No results for input(s): LIPASE, AMYLASE in the last 168 hours. No results for input(s): AMMONIA in the last 168 hours. CBC:  Recent Labs Lab 02/12/15 0615 02/12/15 1800  WBC 7.9 5.5  HGB 8.2* 8.3*  HCT 26.7* 27.8*  MCV 76.3* 75.5*  PLT 318 308   Cardiac Enzymes: No results for input(s): CKTOTAL, CKMB,  CKMBINDEX, TROPONINI in the last 168 hours. BNP: No results for input(s): PROBNP in the last 168 hours. D-Dimer: No results for input(s): DDIMER in the last 168 hours. CBG:  Recent Labs Lab 02/12/15 0758 02/12/15 1158 02/12/15 1701 02/12/15 2035 02/13/15 0626 02/13/15 0658  GLUCAP 154* 211* 178* 116* 63* 117*   Hemoglobin A1C: No results for input(s): HGBA1C in the last 168 hours. Fasting Lipid Panel: No results for input(s): CHOL, HDL, LDLCALC, TRIG, CHOLHDL, LDLDIRECT in the last 168 hours. Thyroid Function Tests: No results for input(s): TSH, T4TOTAL, FREET4, T3FREE, THYROIDAB in the last 168 hours. Coagulation:  Recent Labs Lab 02/11/15 1857  LABPROT 14.1  INR 1.07   Anemia Panel: No results for input(s): VITAMINB12, FOLATE, FERRITIN, TIBC, IRON, RETICCTPCT in the last 168 hours. Urine Drug Screen: Drugs of Abuse  No results found for: LABOPIA, COCAINSCRNUR, LABBENZ, AMPHETMU, THCU, LABBARB  Alcohol Level: No results for input(s): ETH in the last 168 hours. Urinalysis: No results for input(s): COLORURINE, LABSPEC, PHURINE, GLUCOSEU, HGBUR, BILIRUBINUR, KETONESUR, PROTEINUR, UROBILINOGEN, NITRITE, LEUKOCYTESUR in the last 168 hours.  Invalid input(s): APPERANCEUR Misc. Labs:   Micro Results: No results found for this or any previous visit (from the past 240 hour(s)). Studies/Results: No results found. Medications: I have reviewed the patient's current medications. Scheduled Meds: .  sodium chloride   Intravenous Once  . atorvastatin  20 mg Oral Daily  . diclofenac sodium  4 g Topical QID  . feeding supplement  1 Container Oral TID BM  . gabapentin  300 mg Oral QID  . insulin aspart  0-9 Units Subcutaneous TID WC  . insulin glargine  10 Units Subcutaneous QHS  . ketorolac  1 drop Left Eye QID  . ofloxacin  1 drop Left Eye QID  . pantoprazole (PROTONIX) IV  40 mg Intravenous Q12H  . prednisoLONE acetate  1 drop Left Eye QID   Continuous Infusions: .  sodium chloride 20 mL/hr at 02/12/15 2209   PRN Meds:.acetaminophen Assessment/Plan: Principal Problem:   Acute blood loss anemia Active Problems:   DM (diabetes mellitus)   Essential hypertension, benign   Carotid bruit   Chronic mesenteric ischemia   Chronic low back pain   Near syncope  Acute blood loss anemia: Patient had 2 days melanotic stools prior to admission associated with dizziness. Bleed likely 2/2  aspirin and plavix use for carotid/coranary stent. Denies any bowel movements or new bleeding since hospitalization. Denies any dizziness, which was likely due to acute blood loss and dehydration. Most likely upper GI bleed, Hgb trending upwards with most recent 8.3 compared to 7.9 two days ago. -GI consulted, EGD completely normal, scheduled for colonoscopy tomorrow. Recommend lifelong PPI of pantoprazole rather than omeprazole due to possible interference with Plavix. -NPO at midnight -IV NS 20 mL/hr  -Hold aspirin and plavix -Continue Protonix 40 mg every 12 hours  AKI: Corrected likely 2/2 to acute blood loss and dehydration. Latest Creatinine 0.57.  Grade 2 diastolic dysfunction: Echo 09/2014 showed EF 60-65%. Denies chest pain, cough, SOB -Monitor fluids  Carotid artery stenosis, mesenteric ischemia: Carotid stent 02/2014, SMA 06/2013. On aspirin and plavix over a year.  -Hold both aspirin and plavix. Spoke to cardiology and both can be safely stopped for now.   Diabetes: Had low of 63 this morning. Patient is NPO and refused insulin last night. -Decrease Lantus to 10 Units at bedtime  HTN: BPs stable -Continue to hold benazepril   Dispo: Disposition is deferred at this time, awaiting improvement of current medical problems.  Anticipated discharge in approximately 1-3 day(s).   The patient does have a current PCP (Lorayne Marek, MD) and does not need an [facility] hospital follow-up appointment after discharge.  The patient does not have transportation limitations that  hinder transportation to clinic appointments.  .Services Needed at time of discharge: Y = Yes, Blank = No PT:   OT:   RN:   Equipment:   Other:     LOS: 2 days   Zada Finders, MD 02/13/2015, 8:01 AM

## 2015-02-13 NOTE — Progress Notes (Signed)
Patient's endoscopy was completely normal.  I have discussed doing colonoscopy with the patient. She is Hemoccult-negative, and her anemia is normocytic. Nonetheless, she has never had a screening colonoscopy, and in view of her age and the anemia and the fact she's never had screening, I think that despite her multiple medical problems, colonoscopy is appropriate. She feels the same way. Purpose and risks reviewed.  The patient and I also agree that it is probably better to go ahead and do it as an inpatient, taking into account her labile blood sugars and overall medical status, rather than attempting to do it as an outpatient. Accordingly, we will proceed to a prep today and do her colonoscopy tomorrow.  Cleotis Nipper, M.D. Pager [phone] If no answer or after 5 PM call [phone]

## 2015-02-13 NOTE — Transfer of Care (Signed)
Immediate Anesthesia Transfer of Care Note  Patient: Sarah Alvarez  Procedure(s) Performed: Procedure(s): ESOPHAGOGASTRODUODENOSCOPY (EGD) WITH PROPOFOL (Left)  Patient Location: Endoscopy Unit  Anesthesia Type:MAC  Level of Consciousness: awake, alert , oriented and patient cooperative  Airway & Oxygen Therapy: Patient Spontanous Breathing and Patient connected to nasal cannula oxygen  Post-op Assessment: Report given to RN, Post -op Vital signs reviewed and stable and Patient moving all extremities  Post vital signs: Reviewed and stable  Last Vitals:  Filed Vitals:   02/13/15 0912  BP: 118/54  Pulse:   Temp:   Resp: 12    Complications: No apparent anesthesia complications

## 2015-02-14 ENCOUNTER — Encounter (HOSPITAL_COMMUNITY): Payer: Self-pay | Admitting: Gastroenterology

## 2015-02-14 ENCOUNTER — Encounter (HOSPITAL_COMMUNITY)
Admission: EM | Disposition: A | Discharge status: Home / Self Care | Payer: Self-pay | Source: Home / Self Care | Attending: Oncology

## 2015-02-14 ENCOUNTER — Inpatient Hospital Stay (HOSPITAL_COMMUNITY): Payer: Medicaid Other | Admitting: Certified Registered"

## 2015-02-14 HISTORY — PX: COLONOSCOPY WITH PROPOFOL: SHX5780

## 2015-02-14 LAB — GLUCOSE, CAPILLARY
GLUCOSE-CAPILLARY: 98 mg/dL (ref 65–99)
GLUCOSE-CAPILLARY: 103 mg/dL — AB (ref 65–99)
GLUCOSE-CAPILLARY: 305 mg/dL — AB (ref 65–99)
Glucose-Capillary: 110 mg/dL — ABNORMAL HIGH (ref 65–99)

## 2015-02-14 SURGERY — COLONOSCOPY WITH PROPOFOL
Anesthesia: Monitor Anesthesia Care

## 2015-02-14 MED ORDER — PROPOFOL 10 MG/ML IV BOLUS
INTRAVENOUS | Status: DC | PRN
  Administered 2015-02-14: 10 mg via INTRAVENOUS

## 2015-02-14 MED ORDER — FERROUS SULFATE 325 (65 FE) MG PO TABS: 325.0000 mg | ORAL_TABLET | Freq: Every day | ORAL | Status: DC

## 2015-02-14 MED ORDER — PROPOFOL INFUSION 10 MG/ML OPTIME
INTRAVENOUS | Status: DC | PRN
  Administered 2015-02-14: 150 ug/kg/min via INTRAVENOUS

## 2015-02-14 MED ORDER — PANTOPRAZOLE SODIUM 40 MG PO TBEC: 40.0000 mg | DELAYED_RELEASE_TABLET | Freq: Every day | ORAL | Status: DC

## 2015-02-14 MED ORDER — LACTATED RINGERS IV SOLN
INTRAVENOUS | Status: DC | PRN
  Administered 2015-02-14: 13:00:00 via INTRAVENOUS

## 2015-02-14 MED ORDER — PHENYLEPHRINE HCL 10 MG/ML IJ SOLN
INTRAMUSCULAR | Status: DC | PRN
  Administered 2015-02-14: 80 ug via INTRAVENOUS

## 2015-02-14 MED ORDER — LIDOCAINE HCL (CARDIAC) 20 MG/ML IV SOLN
INTRAVENOUS | Status: DC | PRN
  Administered 2015-02-14: 20 mg via INTRAVENOUS

## 2015-02-14 NOTE — Progress Notes (Signed)
Patient's colonoscopy was essentially negative (one tiny polyp removed).  The reason for her microcytic anemia is not clear, but her stool was Hemoccult-negative at time of admission. Perhaps she has had intermittent blood loss from aspirin , even though her current endoscopy and colonoscopy do not show signs of aspirin induced GI tract irritation.   Recommendations:  1. Consider checking iron studies ; although the patient is microcytic, it is possible that she has anemia on a basis different than iron deficiency , in which case it would not be of GI tract origin.  2. Consider outpatient hemoccult monitoring. If positive , could consider doing a capsule endoscopy but at the moment, I don't think it is of sufficient yield to justify doing that procedure. 3. From my standpoint, discharge at any time is okay. I will sign off at this time. Please let us know if we can be of further assistance in this patient's care  Cleotis Nipper, M.D. Pager [phone] If no answer or after 5 PM call [phone]

## 2015-02-14 NOTE — Anesthesia Preprocedure Evaluation (Addendum)
Anesthesia Evaluation  Patient identified by MRN, date of birth, ID band  Reviewed: Allergy & Precautions, NPO status , Patient's Chart, lab work & pertinent test results  Airway Mallampati: II  TM Distance: >3 FB Neck ROM: Full    Dental  (+) Edentulous Upper, Edentulous Lower, Dental Advisory Given   Pulmonary asthma , former smoker,  breath sounds clear to auscultation        Cardiovascular hypertension, + CAD and + Peripheral Vascular Disease Rhythm:Regular Rate:Normal     Neuro/Psych    GI/Hepatic GERD-  ,  Endo/Other  diabetes  Renal/GU      Musculoskeletal   Abdominal   Peds  Hematology  (+) anemia ,   Anesthesia Other Findings   Reproductive/Obstetrics                            Anesthesia Physical Anesthesia Plan  ASA: III  Anesthesia Plan:    Post-op Pain Management:    Induction:   Airway Management Planned:   Additional Equipment:   Intra-op Plan:   Post-operative Plan:   Informed Consent:   Plan Discussed with:   Anesthesia Plan Comments:         Anesthesia Quick Evaluation

## 2015-02-14 NOTE — Discharge Instructions (Signed)
Results of your EGD and Colonoscopy are normal. There was a small polyp found by colonoscopy, we are awaiting pathology results. Depending on results, it is recommended to have a repeat colonoscopy in either 5 or 10 years.  You may safely resume your aspirin and plavix.   We will also prescribe you iron supplement called Ferrous Sulfate 325 mg to be taken once a day. This will help bring your hemoglobin blood levels back up.  To help prevent stomach ulcers/inflammation we will prescribe a medication that reduces your stomach acid production. This is Pantoprazole 40 mg to be taken once daily.  Please schedule a follow up visit with your primary care physician, Dr. Lorayne Marek.

## 2015-02-14 NOTE — Op Note (Signed)
[facility] [place], [address]   COLONOSCOPY PROCEDURE REPORT  PATIENT: Sarah, Alvarez  MR#: [id number] BIRTHDATE: February 04, 1951 , 64  yrs. old GENDER: female ENDOSCOPIST: Ronald Lobo, MD REFERRED BY:  Lorayne Marek, MD PROCEDURE DATE:  Mar 01, 2015 PROCEDURE:    Colonoscopy with biopsy ASA CLASS:   III INDICATIONS:   anemia with negative endoscopy in a diabetic patient on aspirin and no prior history of colonoscopic screening MEDICATIONS:  propofol, per anesthesia  DESCRIPTION OF PROCEDURE:   After the risks and benefits and of the procedure were explained, informed consent was obtained.  The patient was brought from her hospital room to the [facility] cone endoscopy unit.  Timeout was performed.         The Pentax Adult Colon [id number] colonoscope was introduced through the anus and advanced to the terminal ileum      .  The quality of the prep was  excellent .  The instrument was then slowly withdrawn as the colon was fully examined. Estimated blood loss is zero unless otherwise noted in this procedure report.   The quality of the prep was excellent and it's felt that all areas were well seen.        There was no significant difficulty advancing the scope to the terminal ileum.  In the descending colon there was a 3 mm sessile polyp removed by a single cold biopsy.  This was otherwise a completely normal examination. No large polyps, cancer, colitis, vascular malformations, or diverticulosis were noted.  Retroflexion in the rectum was normal.          The scope was then withdrawn from the patient and the procedure completed.  WITHDRAWAL TIME:  per nurse's notes  COMPLICATIONS: There were no immediate complications.  ENDOSCOPIC IMPRESSION: 1. No cause for anemia evident on current examination 2. solitary diminutive sessile polyp in proximal colon of dubious significance, path pending   RECOMMENDATIONS: 1. Await pathology on  the polyp and consider repeat colonoscopy in 5 years if it is adenomatous in character , otherwise, consider repeat screening colonoscopy in 10 years if the patient remains in reasonably good overall medical health  REPEAT EXAM:  to be arranged according to pathology results  cc:  _______________________________ eSigned:  Ronald Lobo, MD 03-01-15 1:28 PM   CPT CODES: ICD CODES:  The ICD and CPT codes recommended by this software are interpretations from the data that the clinical staff has captured with the software.  The verification of the translation of this report to the ICD and CPT codes and modifiers is the sole responsibility of the health care institution and practicing physician where this report was generated.  [employer]. will not be held responsible for the validity of the ICD and CPT codes included on this report.  AMA assumes no liability for data contained or not contained herein. CPT is a Designer, television/film set of the [employer].   PATIENT NAME:  Sarah, Alvarez MR#: [id number]

## 2015-02-14 NOTE — Progress Notes (Signed)
Subjective: Patient is currently NPO and prepped for colonoscopy this afternoon. She had a bowel movement that was not bloody. She states that she is feeling well other than some continued mild achy epigastric abdominal pain that is not limiting her activity. No other changes overnight. Objective: Vital signs in last 24 hours: Filed Vitals:   02/13/15 2136 02/14/15 0631 02/14/15 0825 02/14/15 1135  BP: 179/76 106/46 120/62 126/75  Pulse: 93 88 70 84  Temp: 98 F (36.7 C) 98.8 F (37.1 C) 98.8 F (37.1 C)   TempSrc: Tympanic Oral Oral Oral  Resp: 18 20 18 18   Height:      Weight:  81.557 kg (179 lb 12.8 oz)    SpO2: 100% 100% 100% 99%   Weight change: -0.499 kg (-1 lb 1.6 oz)  Intake/Output Summary (Last 24 hours) at 02/14/15 1250 Last data filed at 02/14/15 0737  Gross per 24 hour  Intake 4963.67 ml  Output      0 ml  Net 4963.67 ml   General: resting in bed HEENT: EOMI, no scleral icterus Cardiac: RRR, systolic murmur heard over upper sternal borders Pulm: clear to auscultation bilaterally, moving normal volumes of air Abd: soft, mild tenderness in epigastric region, nondistended, BS present Ext: warm and well perfused, no pedal edema Neuro: alert and oriented X3  Lab Results: Basic Metabolic Panel:  Recent Labs Lab 02/11/15 0934 02/12/15 0615  NA 137 140  K 3.9 3.8  CL 104 110  CO2 25 25  GLUCOSE 308* 53*  BUN 22* 10  CREATININE 1.15* 0.57  CALCIUM 8.7* 8.6*   Liver Function Tests:  Recent Labs Lab 02/12/15 0615  AST 15  ALT 10*  ALKPHOS 66  BILITOT 0.4  PROT 5.4*  ALBUMIN 3.2*   No results for input(s): LIPASE, AMYLASE in the last 168 hours. No results for input(s): AMMONIA in the last 168 hours. CBC:  Recent Labs Lab 02/12/15 0615 02/12/15 1800  WBC 7.9 5.5  HGB 8.2* 8.3*  HCT 26.7* 27.8*  MCV 76.3* 75.5*  PLT 318 308   Cardiac Enzymes: No results for input(s): CKTOTAL, CKMB, CKMBINDEX, TROPONINI in the last 168 hours. BNP: No  results for input(s): PROBNP in the last 168 hours. D-Dimer: No results for input(s): DDIMER in the last 168 hours. CBG:  Recent Labs Lab 02/13/15 1010 02/13/15 1049 02/13/15 1134 02/13/15 1620 02/13/15 2155 02/14/15 0621  GLUCAP 78 128* 111* 279* 189* 98   Hemoglobin A1C: No results for input(s): HGBA1C in the last 168 hours. Fasting Lipid Panel: No results for input(s): CHOL, HDL, LDLCALC, TRIG, CHOLHDL, LDLDIRECT in the last 168 hours. Thyroid Function Tests: No results for input(s): TSH, T4TOTAL, FREET4, T3FREE, THYROIDAB in the last 168 hours. Coagulation:  Recent Labs Lab 02/11/15 1857  LABPROT 14.1  INR 1.07   Anemia Panel: No results for input(s): VITAMINB12, FOLATE, FERRITIN, TIBC, IRON, RETICCTPCT in the last 168 hours. Urine Drug Screen: Drugs of Abuse  No results found for: LABOPIA, COCAINSCRNUR, LABBENZ, AMPHETMU, THCU, LABBARB  Alcohol Level: No results for input(s): ETH in the last 168 hours. Urinalysis: No results for input(s): COLORURINE, LABSPEC, PHURINE, GLUCOSEU, HGBUR, BILIRUBINUR, KETONESUR, PROTEINUR, UROBILINOGEN, NITRITE, LEUKOCYTESUR in the last 168 hours.  Invalid input(s): APPERANCEUR Misc. Labs:   Micro Results: No results found for this or any previous visit (from the past 240 hour(s)). Studies/Results: No results found. Medications: I have reviewed the patient's current medications. Scheduled Meds: . sodium chloride   Intravenous Once  . atorvastatin  20 mg Oral Daily  . diclofenac sodium  4 g Topical QID  . feeding supplement  1 Container Oral TID BM  . gabapentin  300 mg Oral QID  . insulin aspart  0-9 Units Subcutaneous TID WC  . insulin glargine  10 Units Subcutaneous QHS  . ketorolac  1 drop Left Eye QID  . ofloxacin  1 drop Left Eye QID  . pantoprazole  40 mg Oral Daily  . prednisoLONE acetate  1 drop Left Eye QID   Continuous Infusions: . sodium chloride 1,000 mL (02/13/15 1910)   PRN  Meds:.acetaminophen Assessment/Plan: Principal Problem:   Acute blood loss anemia Active Problems:   DM (diabetes mellitus)   Essential hypertension, benign   Carotid bruit   Chronic mesenteric ischemia   Chronic low back pain   Near syncope  Acute blood loss anemia: Patient had 2 days melanotic stools prior to admission associated with dizziness. Bleed thought to be 2/2  aspirin and plavix use for carotid/coronary stent. EGD yesterday was completely normal. We are appreciative of GI consult. EGD yesterday was completely normal and she may continue aspirin and plavix use. Patient scheduled for colonoscopy this afternoon. GI has recommend lifelong PPI such as pantoprazole rather than omeprazole due to possible interference with Plavix. -NPO today for colonoscopy - if negative/normal will likely discharge today -IV NS 20 mL/hr  -Hold aspirin and plavix for now, can continue after discharge -Continue Protonix 40 mg every 12 hours  AKI: Corrected likely 2/2 to acute blood loss and dehydration. Latest Creatinine 0.57.  Grade 2 diastolic dysfunction: Echo 09/2014 showed EF 60-65%. Denies chest pain, cough, SOB -Monitor fluids  Carotid artery stenosis, mesenteric ischemia: Carotid stent 02/2014, SMA 06/2013. On aspirin and plavix over a year.  -Hold both aspirin and plavix for now. EGD showed no gastritis, GI has advised that it is safe to resume aspirin and plavix in the future. -Atorvastatin 20 mg daily  Diabetes:  -Lantus to 10 Units at bedtime -SSI  HTN: BPs stable -Continue to hold benazepril   Dispo: Disposition is deferred at this time, awaiting improvement of current medical problems.  Anticipated discharge in approximately 0-1 day(s).   The patient does have a current PCP (Lorayne Marek, MD) and does not need an [facility] hospital follow-up appointment after discharge.  The patient does not have transportation limitations that hinder transportation to clinic  appointments.  .Services Needed at time of discharge: Y = Yes, Blank = No PT:   OT:   RN:   Equipment:   Other:     LOS: 3 days   Zada Finders, MD 02/14/2015, 12:50 PM

## 2015-02-14 NOTE — Transfer of Care (Signed)
Immediate Anesthesia Transfer of Care Note  Patient: Sarah Alvarez  Procedure(s) Performed: Procedure(s): COLONOSCOPY WITH PROPOFOL (N/A)  Patient Location: Endoscopy Unit  Anesthesia Type:MAC  Level of Consciousness: oriented and sedated  Airway & Oxygen Therapy: Patient Spontanous Breathing and Patient connected to nasal cannula oxygen  Post-op Assessment: Report given to RN, Post -op Vital signs reviewed and stable and Patient moving all extremities X 4  Post vital signs: Reviewed and stable  Last Vitals:  Filed Vitals:   02/14/15 1135  BP: 126/75  Pulse: 84  Temp:   Resp: 18    Complications: No apparent anesthesia complications

## 2015-02-14 NOTE — Progress Notes (Signed)
Pt discharged home.  Medication list reviewed with patient.  Pt verbalizes understanding.

## 2015-02-14 NOTE — Discharge Summary (Signed)
Name: Sarah Alvarez MRN: [id number] DOB: 1951/07/06 64 y.o. PCP: Lorayne Marek, MD  Date of Admission: 02/11/2015  8:52 AM Date of Discharge: 02/14/2015 Attending Physician: Annia Belt, MD  Discharge Diagnosis: 1. Acute blood loss anemia  Principal Problem:   Acute blood loss anemia Active Problems:   DM (diabetes mellitus)   Essential hypertension, benign   Carotid bruit   Chronic mesenteric ischemia   Chronic low back pain   Near syncope  Discharge Medications:   Medication List    STOP taking these medications        famotidine 20 MG tablet  Commonly known as:  PEPCID      TAKE these medications        accu-chek multiclix lancets  Use as instructed     acetaminophen 650 MG CR tablet  Commonly known as:  TYLENOL  Take 650 mg by mouth every 8 (eight) hours as needed for pain.     aspirin 325 MG tablet  Take 1 tablet (325 mg total) by mouth daily.     atorvastatin 20 MG tablet  Commonly known as:  LIPITOR  Take 1 tablet (20 mg total) by mouth daily.     benazepril 5 MG tablet  Commonly known as:  LOTENSIN  TAKE 2 TABLETS BY MOUTH EVERY MORNING.     clopidogrel 75 MG tablet  Commonly known as:  PLAVIX  Take 1 tablet (75 mg total) by mouth daily with breakfast.     cyclobenzaprine 5 MG tablet  Commonly known as:  FLEXERIL  Take 1 tablet (5 mg total) by mouth at bedtime.     diclofenac sodium 1 % Gel  Commonly known as:  VOLTAREN  APPLY 4 GRAMS TOPICALLY 4 TIMES DAILY.     ferrous sulfate 325 (65 FE) MG tablet  Take 1 tablet (325 mg total) by mouth daily with breakfast.     gabapentin 300 MG capsule  Commonly known as:  NEURONTIN  Take 1 capsule (300 mg total) by mouth 4 (four) times daily.     glipiZIDE 10 MG tablet  Commonly known as:  GLUCOTROL  TAKE 1 TABLET BY MOUTH 2 TIMES DAILY BEFORE A MEAL.     glucose blood test strip  Commonly known as:  ACCU-CHEK AVIVA  Use as instructed     insulin glargine 100 UNIT/ML injection    Commonly known as:  LANTUS  Inject 0.65 mLs (65 Units total) into the skin at bedtime.     ketorolac 0.4 % Soln  Commonly known as:  ACULAR  Place 1 drop into the left eye 4 (four) times daily.     loratadine 10 MG tablet  Commonly known as:  CLARITIN  Take 1 tablet (10 mg total) by mouth daily as needed for allergies or rhinitis.     metFORMIN 500 MG (MOD) 24 hr tablet  Commonly known as:  GLUMETZA  Take 1 tablet (500 mg total) by mouth 2 (two) times daily with a meal.     ofloxacin 0.3 % ophthalmic solution  Commonly known as:  OCUFLOX  Place 1 drop into the left eye 4 (four) times daily.     oxymetazoline 0.05 % nasal spray  Commonly known as:  AFRIN NASAL SPRAY  Place 1 spray into both nostrils 2 (two) times daily as needed for congestion.     pantoprazole 40 MG tablet  Commonly known as:  PROTONIX  Take 1 tablet (40 mg total) by mouth daily.  prednisoLONE acetate 1 % ophthalmic suspension  Commonly known as:  PRED FORTE  Place 1 drop into the left eye 4 (four) times daily.     Vitamin D (Ergocalciferol) 50000 UNITS Caps capsule  Commonly known as:  DRISDOL  Take 50,000 Units by mouth every 7 (seven) days. sunday        Disposition and follow-up:   Sarah Alvarez was discharged from [facility] in Stable condition.  At the hospital follow up visit please address:  1.  Acute blood loss anemia: Patient's Hgb have trended up since hospitalization, latest being 8.3. Will start her on iron replacement with Ferrous Sulfate 325 mg once daily. Please address if she is taking this medication and if she is having any GI side effects.   EGD is completely normal, no sign of gastritis or ulcer. GI has advised that it is safe for her to continue aspirin and plavix. Will continue Atorvastatin as well and start life-long PPI therapy with pantoprazole.  Colonoscopy negative with exception of one small polyp in descending colon.  2.  Labs / imaging needed at  time of follow-up: Can consider CBC although a rise in Hgb will not be seen for about 4-6 weeks after inititiation of iron supplement. Iron studies can be considered to evaluate and rule out other causes of her microcytic anemia.  3.  Pending labs/ test needing follow-up: Pathology from colonoscopy.  Follow-up Appointments: Follow-up Information    Follow up with Lorayne Marek, MD. Schedule an appointment as soon as possible for a visit in 2 weeks.   Specialty:  Internal Medicine   Contact information:   [place] [id number] [phone]       Discharge Instructions: Discharge Instructions    Call MD for:  persistant nausea and vomiting    Complete by:  As directed      Call MD for:  severe uncontrolled pain    Complete by:  As directed      Call MD for:  temperature >100.4    Complete by:  As directed      Diet - low sodium heart healthy    Complete by:  As directed      Increase activity slowly    Complete by:  As directed            Consultations: Treatment Team:  Arta Silence, MD  Procedures Performed:  EGD on 02/13/2015 - please see note. Colonoscopy on 02/14/2015 - please see note.  2D Echo: Echo from 09/26/2014: LVef 60-65%, moderate LV wall thickness, Stage 2 diastolicdysfunction, elevated LV filling pressure, aortic sclerosiswithout stenosis, moderate MR, upper normal LA size.  Cardiac Cath: 10/03/2014:  1. Patent stent mid Circumflex with mild restenosis 2. Diffuse non-obstructive disease in the RCA and LAD  Admission HPI: 68 Y O F with PMH of DM- Uncontrolled, HTN, Carotid artery stenosis, Chronic mesenteric ischemia. Presented to they Ed with complaints of dizziness of one day. Pt was in her usual state of health, until yesterday when she suddenly did not feel too well, was grocery shopping and went to the bathroom, and then realized she had had a bowel movement which was loose and black. She called her sister for help , was dizzy, finally  got home and had several more black watery bowel movements. She denies seeing blood in her stools. She had one episode of vomiting yesterday that was not bloody. Dizziness is present only with standing.  She patient is complaint  with aspirin- She says she takes a baby aspirin, Also takes Plavix. She denies taking any NSAIDs. She has never had a Colonoscopy though she has been told she needs one, she describes having procedure for like an EGD but she says it was when she was managed for chronic mesenteric ischemia. She has never had similar episodes of dizziness. She denies contact with anyone with diarrhea, and recent antibiotic use. She took a tablet of imodium yesterday and she cannot remember having any bowel movement since then.  She she was supposed to have cataract surgery yesterday morning that was deferred because her CBGs- 400s. She denies polyuria or polydipsia. She called her PCPs office and the nurse informed her to take another 5-10U of lantus, this am her CBG- 130s.  She denies chest pain, prior hx of dizziness and is complaint with all her meds.  In the Ed she was found to have a Mild AKI and also noted that her Hgb was 8 from her baseline that has fluctuanted between 9-12 baseline.  Hospital Course by problem list:  Acute blood loss anemia: Patient's initial Hgb in ED was 8.7 which fell to 7.9. 2 PRBCs were prepped, but never transfused. Her Hgb has since trended upwards with latest value of 8.3. Her blood loss/melena were initially attributed to aspirin/NSAID induced gastritis. However, EGD as well as Colonoscopy performed by Dr. Ronald Lobo were both completely normal with exception of one small polyp of descending colon. Patient has been stable during hospital stay with no episodes of bleeding, diarrhea, or vomiting.  Near syncope: Patient's dizziness and lightheadedness are attributed to her volume loss/dehydration due to diarrhea with melanotic stool for 2 days prior to  admission. She denies any similar symptoms during stay. She was rehydrated with IV NS fluids.   Carotid artery stenosis/chronic mesenteric ischemia: Aspirin and plavix were held during stay. Per GI, it is safe to continue use.  Diabetes mellitus: Patient started on 25 Units Lantus at bedtime along with SSI. She had episodes of hypoglycemia for two mornings which we responded to by decreasing her Lantus eventually to 10 Units at bedtime.  Mild AKI: Patient had increase in creatinine from previous baseline of 0.6-0.7 to 1.15. This is attributed to dehydration and blood loss. Creatine quickly corrected to 0.57 with IV fluid resuscitation.  HTN: Patient's Benazepril was held during hospital stay due to mild AKI. Her BPs were stable during this time, with one elevated BP of 179/76. She had been on clear liquid diet or NPO for most of hospital stay.   Discharge Vitals:   BP 159/72 mmHg  Pulse 83  Temp(Src) 98 F (36.7 C) (Oral)  Resp 18  Ht 5\' 3"  (1.6 m)  Wt 81.557 kg (179 lb 12.8 oz)  BMI 31.86 kg/m2  SpO2 100%  Discharge Labs:  Results for orders placed or performed during the hospital encounter of 02/11/15 (from the past 24 hour(s))  Glucose, capillary     Status: Abnormal   Collection Time: 02/13/15  4:20 PM  Result Value Ref Range   Glucose-Capillary 279 (H) 65 - 99 mg/dL   Comment 1 Notify RN   Glucose, capillary     Status: Abnormal   Collection Time: 02/13/15  9:55 PM  Result Value Ref Range   Glucose-Capillary 189 (H) 65 - 99 mg/dL   Comment 1 Notify RN    Comment 2 Document in Chart   Glucose, capillary     Status: None   Collection Time: 02/14/15  6:21  AM  Result Value Ref Range   Glucose-Capillary 98 65 - 99 mg/dL  Glucose, capillary     Status: Abnormal   Collection Time: 02/14/15 11:39 AM  Result Value Ref Range   Glucose-Capillary 110 (H) 65 - 99 mg/dL    Signed: Zada Finders, MD 02/14/2015, 2:33 PM    Services Ordered on Discharge: none Equipment Ordered on  Discharge: none

## 2015-02-14 NOTE — Anesthesia Postprocedure Evaluation (Signed)
  Anesthesia Post-op Note  Patient: Sarah Alvarez  Procedure(s) Performed: Procedure(s): COLONOSCOPY WITH PROPOFOL (N/A)  Patient Location: PACU  Anesthesia Type:MAC  Level of Consciousness: awake and alert   Airway and Oxygen Therapy: Patient Spontanous Breathing  Post-op Pain: none  Post-op Assessment: Post-op Vital signs reviewed              Post-op Vital Signs: Reviewed  Last Vitals:  Filed Vitals:   02/14/15 1405  BP: 159/72  Pulse: 83  Temp: 36.7 C  Resp: 18    Complications: No apparent anesthesia complications

## 2015-02-15 LAB — TYPE AND SCREEN
ABO/RH(D): O POS
ANTIBODY SCREEN: NEGATIVE
UNIT DIVISION: 0
Unit division: 0

## 2015-02-17 ENCOUNTER — Encounter (HOSPITAL_COMMUNITY): Payer: Self-pay | Admitting: Gastroenterology

## 2015-02-18 ENCOUNTER — Telehealth: Payer: Self-pay | Admitting: *Deleted

## 2015-02-18 NOTE — Telephone Encounter (Addendum)
[employer] denied ABI [id number] on 02/03/2015. Pt is informed.

## 2015-02-18 NOTE — Telephone Encounter (Signed)
OK. Please let the patient know it is denied. She had some symptoms but she does have palpable pulses apparently so we will hold off. Probably this is why it was denied. Thanks.

## 2015-02-24 ENCOUNTER — Telehealth: Payer: Self-pay

## 2015-02-24 NOTE — Telephone Encounter (Signed)
-----   Message from Boykin Nearing, MD sent at 02/17/2015  6:29 PM EDT ----- Tubular adenoma on biopsy of colon polpy, repeat colonoscopy in 5 years recommended

## 2015-02-24 NOTE — Telephone Encounter (Signed)
Spoke with patient about her colonoscopy results and she is aware Patient also aware her next colonoscopy will be in five years

## 2015-02-25 ENCOUNTER — Telehealth: Payer: Self-pay | Admitting: Internal Medicine

## 2015-02-25 ENCOUNTER — Encounter (HOSPITAL_COMMUNITY): Payer: Self-pay | Admitting: Emergency Medicine

## 2015-02-25 ENCOUNTER — Inpatient Hospital Stay (HOSPITAL_COMMUNITY)
Admission: EM | Admit: 2015-02-25 | Discharge: 2015-03-01 | DRG: 803 | Disposition: A | Discharge status: Home / Self Care | Payer: Medicaid Other | Attending: Student in an Organized Health Care Education/Training Program | Admitting: Student in an Organized Health Care Education/Training Program

## 2015-02-25 ENCOUNTER — Telehealth: Payer: Self-pay

## 2015-02-25 DIAGNOSIS — Z79899 Other long term (current) drug therapy: Secondary | ICD-10-CM

## 2015-02-25 DIAGNOSIS — Q2733 Arteriovenous malformation of digestive system vessel: Secondary | ICD-10-CM

## 2015-02-25 DIAGNOSIS — E1151 Type 2 diabetes mellitus with diabetic peripheral angiopathy without gangrene: Secondary | ICD-10-CM | POA: Diagnosis not present

## 2015-02-25 DIAGNOSIS — E119 Type 2 diabetes mellitus without complications: Secondary | ICD-10-CM

## 2015-02-25 DIAGNOSIS — Z780 Asymptomatic menopausal state: Secondary | ICD-10-CM

## 2015-02-25 DIAGNOSIS — K922 Gastrointestinal hemorrhage, unspecified: Secondary | ICD-10-CM | POA: Diagnosis present

## 2015-02-25 DIAGNOSIS — R55 Syncope and collapse: Secondary | ICD-10-CM | POA: Diagnosis present

## 2015-02-25 DIAGNOSIS — E1142 Type 2 diabetes mellitus with diabetic polyneuropathy: Secondary | ICD-10-CM | POA: Diagnosis present

## 2015-02-25 DIAGNOSIS — I739 Peripheral vascular disease, unspecified: Secondary | ICD-10-CM | POA: Diagnosis present

## 2015-02-25 DIAGNOSIS — I2510 Atherosclerotic heart disease of native coronary artery without angina pectoris: Secondary | ICD-10-CM | POA: Diagnosis present

## 2015-02-25 DIAGNOSIS — Z7982 Long term (current) use of aspirin: Secondary | ICD-10-CM

## 2015-02-25 DIAGNOSIS — E1165 Type 2 diabetes mellitus with hyperglycemia: Secondary | ICD-10-CM | POA: Diagnosis present

## 2015-02-25 DIAGNOSIS — Z955 Presence of coronary angioplasty implant and graft: Secondary | ICD-10-CM | POA: Diagnosis not present

## 2015-02-25 DIAGNOSIS — I10 Essential (primary) hypertension: Secondary | ICD-10-CM | POA: Diagnosis present

## 2015-02-25 DIAGNOSIS — G2581 Restless legs syndrome: Secondary | ICD-10-CM | POA: Diagnosis present

## 2015-02-25 DIAGNOSIS — IMO0002 Reserved for concepts with insufficient information to code with codable children: Secondary | ICD-10-CM | POA: Diagnosis present

## 2015-02-25 DIAGNOSIS — E785 Hyperlipidemia, unspecified: Secondary | ICD-10-CM | POA: Diagnosis present

## 2015-02-25 DIAGNOSIS — Z833 Family history of diabetes mellitus: Secondary | ICD-10-CM | POA: Diagnosis not present

## 2015-02-25 DIAGNOSIS — K551 Chronic vascular disorders of intestine: Secondary | ICD-10-CM | POA: Diagnosis present

## 2015-02-25 DIAGNOSIS — D509 Iron deficiency anemia, unspecified: Secondary | ICD-10-CM | POA: Insufficient documentation

## 2015-02-25 DIAGNOSIS — Z7902 Long term (current) use of antithrombotics/antiplatelets: Secondary | ICD-10-CM

## 2015-02-25 DIAGNOSIS — Z87891 Personal history of nicotine dependence: Secondary | ICD-10-CM | POA: Diagnosis not present

## 2015-02-25 DIAGNOSIS — Z794 Long term (current) use of insulin: Secondary | ICD-10-CM

## 2015-02-25 DIAGNOSIS — K219 Gastro-esophageal reflux disease without esophagitis: Secondary | ICD-10-CM | POA: Diagnosis present

## 2015-02-25 DIAGNOSIS — Z8249 Family history of ischemic heart disease and other diseases of the circulatory system: Secondary | ICD-10-CM | POA: Diagnosis not present

## 2015-02-25 DIAGNOSIS — I252 Old myocardial infarction: Secondary | ICD-10-CM | POA: Diagnosis not present

## 2015-02-25 DIAGNOSIS — D500 Iron deficiency anemia secondary to blood loss (chronic): Secondary | ICD-10-CM | POA: Diagnosis present

## 2015-02-25 DIAGNOSIS — D649 Anemia, unspecified: Secondary | ICD-10-CM | POA: Diagnosis present

## 2015-02-25 HISTORY — DX: Headache: R51

## 2015-02-25 HISTORY — DX: Headache, unspecified: R51.9

## 2015-02-25 LAB — COMPREHENSIVE METABOLIC PANEL
ALBUMIN: 3.4 g/dL — AB (ref 3.5–5.0)
ALT: 12 U/L — ABNORMAL LOW (ref 14–54)
ALT: 11 U/L — ABNORMAL LOW (ref 14–54)
ANION GAP: 7 (ref 5–15)
AST: 16 U/L (ref 15–41)
AST: 17 U/L (ref 15–41)
Albumin: 3.3 g/dL — ABNORMAL LOW (ref 3.5–5.0)
Alkaline Phosphatase: 64 U/L (ref 38–126)
Alkaline Phosphatase: 64 U/L (ref 38–126)
Anion gap: 10 (ref 5–15)
BILIRUBIN TOTAL: 0.4 mg/dL (ref 0.3–1.2)
BUN: 16 mg/dL (ref 6–20)
BUN: 14 mg/dL (ref 6–20)
CALCIUM: 8.4 mg/dL — AB (ref 8.9–10.3)
CO2: 21 mmol/L — AB (ref 22–32)
CO2: 22 mmol/L (ref 22–32)
Calcium: 8.7 mg/dL — ABNORMAL LOW (ref 8.9–10.3)
Chloride: 103 mmol/L (ref 101–111)
Chloride: 107 mmol/L (ref 101–111)
Creatinine, Ser: 0.79 mg/dL (ref 0.44–1.00)
Creatinine, Ser: 0.75 mg/dL (ref 0.44–1.00)
GFR calc Af Amer: >60 mL/min (ref >60)
GFR calc Af Amer: >60 mL/min (ref >60)
GFR calc non Af Amer: >60 mL/min (ref >60)
GLUCOSE: 457 mg/dL — AB (ref 65–99)
Glucose, Bld: 320 mg/dL — ABNORMAL HIGH (ref 65–99)
POTASSIUM: 4.8 mmol/L (ref 3.5–5.1)
POTASSIUM: 4.4 mmol/L (ref 3.5–5.1)
SODIUM: 134 mmol/L — AB (ref 135–145)
Sodium: 136 mmol/L (ref 135–145)
TOTAL PROTEIN: 5.1 g/dL — AB (ref 6.5–8.1)
Total Bilirubin: 0.4 mg/dL (ref 0.3–1.2)
Total Protein: 5.5 g/dL — ABNORMAL LOW (ref 6.5–8.1)

## 2015-02-25 LAB — CBC WITH DIFFERENTIAL/PLATELET
Basophils Absolute: 0 10*3/uL (ref 0.0–0.1)
Basophils Relative: 1 % (ref 0–1)
EOS PCT: 3 % (ref 0–5)
Eosinophils Absolute: 0.1 10*3/uL (ref 0.0–0.7)
HEMATOCRIT: 30.5 % — AB (ref 36.0–46.0)
Hemoglobin: 8.9 g/dL — ABNORMAL LOW (ref 12.0–15.0)
LYMPHS ABS: 1.4 10*3/uL (ref 0.7–4.0)
Lymphocytes Relative: 33 % (ref 12–46)
MCH: 21.1 pg — ABNORMAL LOW (ref 26.0–34.0)
MCHC: 29.2 g/dL — AB (ref 30.0–36.0)
MCV: 72.3 fL — AB (ref 78.0–100.0)
MONOS PCT: 8 % (ref 3–12)
Monocytes Absolute: 0.3 10*3/uL (ref 0.1–1.0)
NEUTROS ABS: 2.5 10*3/uL (ref 1.7–7.7)
Neutrophils Relative %: 55 % (ref 43–77)
Platelets: 343 10*3/uL (ref 150–400)
RBC: 4.22 MIL/uL (ref 3.87–5.11)
RDW: 18.1 % — ABNORMAL HIGH (ref 11.5–15.5)
WBC: 4.3 10*3/uL (ref 4.0–10.5)

## 2015-02-25 LAB — CBC
HEMATOCRIT: 23.5 % — AB (ref 36.0–46.0)
Hemoglobin: 7.2 g/dL — ABNORMAL LOW (ref 12.0–15.0)
MCH: 23.7 pg — ABNORMAL LOW (ref 26.0–34.0)
MCHC: 30.6 g/dL (ref 30.0–36.0)
MCV: 77.3 fL — ABNORMAL LOW (ref 78.0–100.0)
Platelets: 296 10*3/uL (ref 150–400)
RBC: 3.04 MIL/uL — ABNORMAL LOW (ref 3.87–5.11)
RDW: 19.4 % — AB (ref 11.5–15.5)
WBC: 6.2 10*3/uL (ref 4.0–10.5)

## 2015-02-25 LAB — FOLATE: FOLATE: 30.7 ng/mL (ref >5.9)

## 2015-02-25 LAB — I-STAT CHEM 8, ED
BUN: 20 mg/dL (ref 6–20)
Calcium, Ion: 1.15 mmol/L (ref 1.13–1.30)
Chloride: 102 mmol/L (ref 101–111)
Creatinine, Ser: 0.8 mg/dL (ref 0.44–1.00)
Glucose, Bld: 443 mg/dL — ABNORMAL HIGH (ref 65–99)
HCT: 23 % — ABNORMAL LOW (ref 36.0–46.0)
Hemoglobin: 7.8 g/dL — ABNORMAL LOW (ref 12.0–15.0)
Potassium: 4.7 mmol/L (ref 3.5–5.1)
Sodium: 134 mmol/L — ABNORMAL LOW (ref 135–145)
TCO2: 23 mmol/L (ref 0–100)

## 2015-02-25 LAB — URINE MICROSCOPIC-ADD ON

## 2015-02-25 LAB — RETICULOCYTES
RBC.: 2.96 MIL/uL — ABNORMAL LOW (ref 3.87–5.11)
RETIC COUNT ABSOLUTE: 68.1 10*3/uL (ref 19.0–186.0)
Retic Ct Pct: 2.3 % (ref 0.4–3.1)

## 2015-02-25 LAB — URINALYSIS, ROUTINE W REFLEX MICROSCOPIC
Bilirubin Urine: NEGATIVE
Glucose, UA: >1000 mg/dL — AB
Hgb urine dipstick: NEGATIVE
Ketones, ur: NEGATIVE mg/dL
Leukocytes, UA: NEGATIVE
Nitrite: NEGATIVE
Protein, ur: NEGATIVE mg/dL
Specific Gravity, Urine: 1.025 (ref 1.005–1.030)
Urobilinogen, UA: 0.2 mg/dL (ref 0.0–1.0)
pH: 5.5 (ref 5.0–8.0)

## 2015-02-25 LAB — IRON AND TIBC
Iron: 13 ug/dL — ABNORMAL LOW (ref 28–170)
Saturation Ratios: 3 % — ABNORMAL LOW (ref 10.4–31.8)
TIBC: 384 ug/dL (ref 250–450)
UIBC: 371 ug/dL

## 2015-02-25 LAB — POC OCCULT BLOOD, ED: Fecal Occult Bld: POSITIVE — AB

## 2015-02-25 LAB — GLUCOSE, CAPILLARY: Glucose-Capillary: 233 mg/dL — ABNORMAL HIGH (ref 65–99)

## 2015-02-25 LAB — VITAMIN B12: Vitamin B-12: 297 pg/mL (ref 180–914)

## 2015-02-25 LAB — FERRITIN: FERRITIN: 3 ng/mL — AB (ref 11–307)

## 2015-02-25 MED ORDER — SODIUM CHLORIDE 0.9 % IJ SOLN
3.0000 mL | Freq: Two times a day (BID) | INTRAMUSCULAR | Status: DC
  Administered 2015-02-25 – 2015-02-28 (×3): 3 mL via INTRAVENOUS

## 2015-02-25 MED ORDER — INSULIN GLARGINE 100 UNIT/ML SOLOSTAR PEN: 10.0000 [IU] | PEN_INJECTOR | Freq: Every day | SUBCUTANEOUS | Status: DC

## 2015-02-25 MED ORDER — BENAZEPRIL HCL 5 MG PO TABS
5.0000 mg | ORAL_TABLET | Freq: Every day | ORAL | Status: DC
  Administered 2015-02-26 – 2015-03-01 (×4): 5 mg via ORAL
  Filled 2015-02-25 (×4): qty 1

## 2015-02-25 MED ORDER — ATORVASTATIN CALCIUM 20 MG PO TABS
20.0000 mg | ORAL_TABLET | Freq: Every day | ORAL | Status: DC
  Administered 2015-02-25 – 2015-03-01 (×5): 20 mg via ORAL
  Filled 2015-02-25 (×5): qty 1

## 2015-02-25 MED ORDER — PANTOPRAZOLE SODIUM 40 MG PO TBEC
40.0000 mg | DELAYED_RELEASE_TABLET | Freq: Every day | ORAL | Status: DC
  Administered 2015-02-25 – 2015-03-01 (×5): 40 mg via ORAL
  Filled 2015-02-25 (×5): qty 1

## 2015-02-25 MED ORDER — KETOROLAC TROMETHAMINE 30 MG/ML IJ SOLN
30.0000 mg | Freq: Once | INTRAMUSCULAR | Status: AC
  Administered 2015-02-25: 30 mg via INTRAMUSCULAR
  Filled 2015-02-25: qty 1

## 2015-02-25 MED ORDER — GABAPENTIN 300 MG PO CAPS
300.0000 mg | ORAL_CAPSULE | Freq: Four times a day (QID) | ORAL | Status: DC
  Administered 2015-02-25 – 2015-03-01 (×15): 300 mg via ORAL
  Filled 2015-02-25 (×15): qty 1

## 2015-02-25 MED ORDER — PROCHLORPERAZINE MALEATE 5 MG PO TABS
5.0000 mg | ORAL_TABLET | Freq: Once | ORAL | Status: AC
  Administered 2015-02-25: 5 mg via ORAL
  Filled 2015-02-25 (×2): qty 1

## 2015-02-25 MED ORDER — INSULIN GLARGINE 100 UNIT/ML ~~LOC~~ SOLN
10.0000 [IU] | Freq: Every day | SUBCUTANEOUS | Status: DC
  Administered 2015-02-25 – 2015-02-27 (×3): 10 [IU] via SUBCUTANEOUS
  Filled 2015-02-25 (×4): qty 0.1

## 2015-02-25 MED ORDER — KETOROLAC TROMETHAMINE 0.4 % OP SOLN: 1.0000 [drp] | Freq: Four times a day (QID) | OPHTHALMIC | Status: DC

## 2015-02-25 MED ORDER — OFLOXACIN 0.3 % OP SOLN
1.0000 [drp] | Freq: Four times a day (QID) | OPHTHALMIC | Status: DC
  Administered 2015-02-25 – 2015-03-01 (×15): 1 [drp] via OPHTHALMIC
  Filled 2015-02-25: qty 5

## 2015-02-25 MED ORDER — KETOROLAC TROMETHAMINE 0.5 % OP SOLN
1.0000 [drp] | Freq: Four times a day (QID) | OPHTHALMIC | Status: DC
  Administered 2015-02-25 – 2015-03-01 (×14): 1 [drp] via OPHTHALMIC
  Filled 2015-02-25: qty 3

## 2015-02-25 MED ORDER — SODIUM CHLORIDE 0.9 % IV SOLN: 250.0000 mL | INTRAVENOUS | Status: DC | PRN

## 2015-02-25 MED ORDER — ACETAMINOPHEN 325 MG PO TABS
650.0000 mg | ORAL_TABLET | Freq: Once | ORAL | Status: AC
  Administered 2015-02-25: 650 mg via ORAL
  Filled 2015-02-25: qty 2

## 2015-02-25 MED ORDER — LORATADINE 10 MG PO TABS: 10.0000 mg | ORAL_TABLET | Freq: Every day | ORAL | Status: DC | PRN

## 2015-02-25 MED ORDER — SODIUM CHLORIDE 0.9 % IJ SOLN: 3.0000 mL | INTRAMUSCULAR | Status: DC | PRN

## 2015-02-25 MED ORDER — DIPHENHYDRAMINE HCL 25 MG PO CAPS
25.0000 mg | ORAL_CAPSULE | Freq: Once | ORAL | Status: AC
  Administered 2015-02-25: 25 mg via ORAL
  Filled 2015-02-25: qty 1

## 2015-02-25 MED ORDER — SODIUM CHLORIDE 0.9 % IV BOLUS (SEPSIS)
1000.0000 mL | Freq: Once | INTRAVENOUS | Status: AC
  Administered 2015-02-25: 1000 mL via INTRAVENOUS

## 2015-02-25 MED ORDER — SODIUM CHLORIDE 0.9 % IJ SOLN
3.0000 mL | Freq: Two times a day (BID) | INTRAMUSCULAR | Status: DC
  Administered 2015-02-25: 3 mL via INTRAVENOUS

## 2015-02-25 MED ORDER — PREDNISOLONE ACETATE 1 % OP SUSP
1.0000 [drp] | Freq: Four times a day (QID) | OPHTHALMIC | Status: DC
  Administered 2015-02-25 – 2015-03-01 (×14): 1 [drp] via OPHTHALMIC
  Filled 2015-02-25: qty 1

## 2015-02-25 NOTE — Telephone Encounter (Signed)
Patient called complaining of dizziness feeling whoozy and kind Of walking " crooked" patient stated her blood sugar this am was 291 Patient stated her symptoms started yesterday Instructed patient that since we do not have a walk in clinic today To be evaluated at either the ED or urgent care

## 2015-02-25 NOTE — ED Provider Notes (Signed)
Medical screening examination/treatment/procedure(s) were conducted as a shared visit with non-physician practitioner(s) and myself.  I personally evaluated the patient during the encounter.   EKG Interpretation None       See the written copy of this report in the patient's paper medical record.  These results did not interface directly into the electronic medical record and are summarized here.  64 year old female with a chief complaint of symptomatically anemia. Patient having dark stools. Patient was seen here for the same was admitted had a EGD as well as colonoscopy which were benign. Patient continues to have dark stools and is having worsening symptoms with this. Difficulty moving around well at home. Denies any chest pain or shortness breath.  On exam patient with marked pallor. Subconjunctival pallor as well. Abdomen soft nontender nondistended.  Hemoglobin on discharge was 8.5. Repeat here was 7.2. Admit to the hospital.  Deno Etienne, DO 02/25/15 1655

## 2015-02-25 NOTE — Progress Notes (Signed)
Sarah Alvarez [id number] Admission Data: 02/25/2015 6:58 PM Attending Provider: Axel Filler, MD  UKG:URKYHC, Sarah Prey, MD Consults/ Treatment Team:    Sarah Alvarez is a 64 y.o. female patient admitted from ED awake, alert  & orientated  X 3,  Full Code, VSS - Blood pressure 128/50, pulse 84, temperature 98.6 F (37 C), temperature source Oral, resp. rate 19, height 5\' 3"  (1.6 m), weight 83.4 kg (183 lb 13.8 oz), SpO2 100 %., no c/o shortness of breath, no c/o chest pain, no distress noted. Tele # 11 placed and pt is currently running:normal sinus rhythm.   IV site WDL:  hand left, condition patent and no redness with a transparent dsg that's clean dry and intact.  Allergies:  No Known Allergies   Past Medical History  Diagnosis Date  . Staph infection   . Restless leg syndrome   . Chronic mesenteric ischemia     s/p SMA stent 06/22/13  . Coronary artery disease   . Hypertension   . Hyperlipidemia   . Heart murmur   . Asthma     "mild touch" (02/11/2015)  . Type II diabetes mellitus   . Anemia   . History of blood transfusion     "getting one today for my low HgB" (82/2016)  . GERD (gastroesophageal reflux disease)   . Headache     "weekly" (02/11/2015)  . Arthritis     "hands" (02/11/2015)  . Chronic lower back pain   . Diabetic peripheral neuropathy     "in my legs" (02/11/2015)    History:  obtained from the patient. Tobacco/alcohol: denied none  Pt orientation to unit, room and routine. Information packet given to patient/family and safety video watched.  Admission INP armband ID verified with patient/family, and in place. SR up x 2, fall risk assessment complete with Patient and family verbalizing understanding of risks associated with falls. Pt verbalizes an understanding of how to use the call bell and to call for help before getting out of bed.  Skin, clean-dry- intact without evidence of bruising, or skin tears.   No evidence of skin break down noted on  exam. no rashes, no ecchymoses, no petechiae, no nodules    Will cont to monitor and assist as needed.  Sarah Escareno Margaretha Sheffield, RN 02/25/2015 6:58 PM

## 2015-02-25 NOTE — ED Provider Notes (Signed)
CSN: [id number]     Arrival date & time 02/25/15  1152 History   First MD Initiated Contact with Patient 02/25/15 1441     Chief Complaint  Patient presents with  . 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 She has been maintained on dual antiplatelets agents with aspirin and Plavix.  Patient presents today with orthostatic dizziness. Patient was recently admitted to the hospital service for similar symptoms and discharged on 02/14/2015. Patient reports since shortly before hospital admission she was having orthostatic dizziness every time she stood up followed she was going to pass out. She reports symptoms are present when sitting and looking side to side or any other rapid head movements. She reports that symptoms are not associated with chest pain, shortness of breath, nausea or vomiting. She notes that symptoms persisted through her hospital stay, was also having a headache at that time. She reports that the dizziness has persisted despite adequate by mouth intake. At the time of admission patient was reporting blood in her stools, she had GI assessment with colonoscopy and endoscopy which showed no significant contributory findings. At the time discharge her hemoglobin was 8.3.   Patient reports that since hospital discharge she's been eating and drinking appropriately but denies having a bowel movement since discharge. Patient denies abdominal pain.  Past Medical History  Diagnosis Date  . Staph infection   . Restless leg syndrome   . Chronic mesenteric ischemia     s/p SMA  stent 06/22/13  . Coronary artery disease   . Hypertension   . Hyperlipidemia   . Heart murmur   . Asthma     "mild touch" (02/11/2015)  . Type II diabetes mellitus   . Anemia   . History of blood transfusion     "getting one today for my low HgB" (82/2016)  . GERD (gastroesophageal reflux disease)   . Headache     "weekly" (02/11/2015)  . Arthritis     "hands" (02/11/2015)  . Chronic lower back pain   . Diabetic peripheral neuropathy     "in my legs" (02/11/2015)   Past Surgical History  Procedure Laterality Date  . Endarterectomy Right 07/19/2013    Procedure: ENDARTERECTOMY CAROTID-RIGHT;  Surgeon: Angelia Mould, MD;  Location: [place];  Service: Vascular;  Laterality: Right;  . Insertion of retrograde carotid stent Left 03/05/2014    Procedure: 1)Cutdown of open exposure left common carotid; 2) Retrograde left common carotid stent; 3) Repair of left common carotid artery.;  Surgeon: Elam Dutch, MD;  Location: [place];  Service: Vascular;  Laterality: Left;  . Arch aortogram N/A 02/11/2014    Procedure: ARCH AORTOGRAM;  Surgeon: Angelia Mould, MD;  Location: [facility] CATH LAB;  Service: Cardiovascular;  Laterality: N/A;  . Left heart catheterization with coronary angiogram N/A 10/03/2014    Procedure: LEFT HEART CATHETERIZATION WITH CORONARY ANGIOGRAM;  Surgeon: Burnell Blanks, MD;  Location: [facility] CATH LAB;  Service: Cardiovascular;  Laterality: N/A;  . Cataract extraction w/ intraocular lens implant Left 02/03/2015  . Tonsillectomy  1950's  . Laparoscopic cholecystectomy    . Finger fracture surgery Left  2015    "they had to rebreak it and put pins in it"  . Fracture surgery    . Abdominal hysterectomy    . Coronary angioplasty      ? LCX stent ~ 2000 in [place], [place] with LCX stent [facility] 09/1999 for reported re-instent stenosis  . Esophagogastroduodenoscopy (egd) with propofol Left 02/13/2015    Procedure: ESOPHAGOGASTRODUODENOSCOPY (EGD) WITH PROPOFOL;  Surgeon: Ronald Lobo, MD;  Location: [place];  Service: Endoscopy;  Laterality: Left;  . Colonoscopy with propofol N/A 02/14/2015    Procedure: COLONOSCOPY WITH PROPOFOL;  Surgeon: Ronald Lobo, MD;  Location: [facility] ENDOSCOPY;  Service: Endoscopy;  Laterality: N/A;   Family History  Problem Relation Age of Onset  . Diabetes Mother   . Stroke Mother   . Hyperlipidemia Mother   . Cancer Father   . Heart disease Father     PVD and  CAROTID  . Heart attack Father   . Diabetes Sister   . Hypertension Sister   . Heart disease Sister     Before age 102  . Hyperlipidemia Sister   . Varicose Veins Sister   . Cancer Brother   . Cancer Maternal Aunt     breast cancer   . Diabetes Maternal Grandmother   . Diabetes Paternal Grandmother    Social History  Substance Use Topics  . Smoking status: Former Smoker -- 1.00 packs/day for 20 years    Types: Cigarettes    Quit date: 06/21/2013  . Smokeless tobacco: Never Used  . Alcohol Use: No   OB History    No data available     Review of Systems  All other systems reviewed and are negative.   Allergies  Review of patient's allergies indicates no known allergies.  Home Medications   Prior to Admission medications   Medication Sig Start Date End Date Taking? Authorizing Provider  acetaminophen (TYLENOL) 650 MG CR tablet Take 650 mg by mouth every 8 (eight) hours as needed for pain.    Historical Provider, MD  aspirin 325 MG tablet Take 1 tablet (325 mg total) by mouth daily. 06/25/13   Donne Hazel, MD  atorvastatin (LIPITOR) 20 MG tablet Take 1 tablet (20 mg total) by mouth daily. 08/13/14   Lorayne Marek, MD  benazepril (LOTENSIN) 5 MG tablet TAKE 2 TABLETS BY MOUTH EVERY MORNING. 12/16/14   Lorayne Marek, MD  clopidogrel (PLAVIX) 75 MG tablet Take 1 tablet (75 mg total) by mouth daily with breakfast. 12/16/14   Lorayne Marek, MD  cyclobenzaprine (FLEXERIL) 5 MG tablet Take 1 tablet (5 mg total) by mouth at bedtime. 12/16/14   Lorayne Marek, MD   diclofenac sodium (VOLTAREN) 1 % GEL APPLY 4 GRAMS TOPICALLY 4 TIMES DAILY. 12/16/14   Lorayne Marek, MD  ferrous sulfate 325 (65 FE) MG tablet Take 1 tablet (325 mg total) by mouth daily with breakfast. 02/14/15   Zada Finders, MD  gabapentin (NEURONTIN) 300 MG capsule Take 1 capsule (300 mg total) by mouth 4 (four) times daily. 12/30/14   Trula Slade, DPM  glipiZIDE (GLUCOTROL) 10 MG tablet TAKE 1 TABLET BY MOUTH 2 TIMES DAILY BEFORE A MEAL. 12/16/14   Lorayne Marek, MD  glucose blood (ACCU-CHEK AVIVA) test strip Use as instructed 06/19/14   Lorayne Marek, MD  insulin glargine (LANTUS) 100 UNIT/ML injection Inject 0.65 mLs (65 Units total) into the skin at bedtime. 08/13/14   Lorayne Marek, MD  ketorolac (ACULAR) 0.4 % SOLN Place 1 drop into the  left eye 4 (four) times daily.    Historical Provider, MD  Lancets (ACCU-CHEK MULTICLIX) lancets Use as instructed 06/19/14   Lorayne Marek, MD  loratadine (CLARITIN) 10 MG tablet Take 1 tablet (10 mg total) by mouth daily as needed for allergies or rhinitis. 01/22/14   Lorayne Marek, MD  metFORMIN (GLUMETZA) 500 MG (MOD) 24 hr tablet Take 1 tablet (500 mg total) by mouth 2 (two) times daily with a meal. 01/22/14   Lorayne Marek, MD  ofloxacin (OCUFLOX) 0.3 % ophthalmic solution Place 1 drop into the left eye 4 (four) times daily.    Historical Provider, MD  oxymetazoline (AFRIN NASAL SPRAY) 0.05 % nasal spray Place 1 spray into both nostrils 2 (two) times daily as needed for congestion. 01/22/14   Lorayne Marek, MD  pantoprazole (PROTONIX) 40 MG tablet Take 1 tablet (40 mg total) by mouth daily. 02/14/15   Zada Finders, MD  prednisoLONE acetate (PRED FORTE) 1 % ophthalmic suspension Place 1 drop into the left eye 4 (four) times daily.    Historical Provider, MD  Vitamin D, Ergocalciferol, (DRISDOL) 50000 UNITS CAPS capsule Take 50,000 Units by mouth every 7 (seven) days. sunday    Historical Provider, MD   BP 106/51 mmHg  Pulse 91  Temp(Src) 98.2 F (36.8 C)  (Oral)  Resp 18  SpO2 99%    Physical Exam  Constitutional: She is oriented to person, place, and time. She appears well-developed and well-nourished.  HENT:  Head: Normocephalic and atraumatic.  Eyes: Conjunctivae are normal. Pupils are equal, round, and reactive to light. Right eye exhibits no discharge. Left eye exhibits no discharge. No scleral icterus.  Neck: Normal range of motion. No JVD present. No tracheal deviation present.  Cardiovascular: Normal rate, regular rhythm, normal heart sounds and intact distal pulses.  Exam reveals no gallop and no friction rub.   No murmur heard. Pulmonary/Chest: Effort normal. No stridor.  Abdominal: Soft. Bowel sounds are normal. She exhibits no distension and no mass. There is no tenderness. There is no rebound and no guarding.  Genitourinary:  Soft stool in the rectal vault, no frank blood  Neurological: She is alert and oriented to person, place, and time. Coordination normal.  Psychiatric: She has a normal mood and affect. Her behavior is normal. Judgment and thought content normal.  Nursing note and vitals reviewed.   ED Course  Procedures (including critical care time) Labs Review Labs Reviewed  CBC WITH DIFFERENTIAL/PLATELET  COMPREHENSIVE METABOLIC PANEL  URINALYSIS, ROUTINE W REFLEX MICROSCOPIC (NOT AT [facility])    Imaging Review No results found. I have personally reviewed and evaluated these images and lab results as part of my medical decision-making.   EKG Interpretation None      MDM   Final diagnoses:  Iron deficiency anemia due to chronic blood loss    Labs:  Imaging:  Consults:  Therapeutics:  Discharge Meds:   Assessment/Plan: Patient presents with orthostatic hypotension, with hemoglobin of 7.2 with Hemoccult-positive stool. Patient was recently admitted for orthostatic hypotension with endoscopy and colonoscopy which showed no signs of active bleeding. Hospital consult for inpatient management. Patient  remained stable here in the ED, vital signs reassuring.         Okey Regal, PA-C 02/26/15 Patrick, DO 02/26/15 [phone]

## 2015-02-25 NOTE — Telephone Encounter (Signed)
Patient feeling light headed and felt like she was going to pass out and is staggerring on the floor when she moves around. She is not sure if she needs to go to the ER. Please follow up with pt. Patent has appt on Monday the 22nd. Thank you.

## 2015-02-25 NOTE — Progress Notes (Signed)
Received report from [facility] in ED

## 2015-02-25 NOTE — ED Notes (Signed)
MD at bedside. 

## 2015-02-25 NOTE — H&P (Signed)
Date: 02/25/2015               Patient Name:  Sarah Alvarez MRN: [id number]  DOB: 11/24/1950 Age / Sex: 64 y.o., female   PCP: Lorayne Marek, MD         Medical Service: Internal Medicine Teaching Service         Attending Physician: Dr. Axel Filler, MD    First Contact: Dr. Burgess Estelle Pager: [phone]  Second Contact: Dr. 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   She was feeling fine until yesterday morning when she started to feel a little dizzy. She was doing her usual ADLs and then she she felt dizzy, and felt like she was about to pass out. And held onto the chair but no LOC. Then this morning, she ate the breakfast,  And was reading the paper when she became dizzy again. She checked her CBGs which was 291. She takes 65 units of Lantus, metformin and glipizide. She also notes a weight loss of about 70 lbs over the last year.   She was admitted on August 2nd with complaints of dizziness and passing black stool mixed with blood, underwent EGD and colonoscopy. EGD was normal- no ulcers and no antiplatelet related gastritis, colonoscopy revealed benign polyp. Her HgB was stable on discharge and was 8.3 and she did not require any blood transfusion. The etiology of the bleeding was undetermined and we had thought that we can do capsule endoscopy to rule out small bowel AVM.   She was put on iron supplement  which she did not have a chance to start/ pick up.   She does not report any constitutional symptoms, and other ROS unremarkable, no gross blood in stool. No n/v/d/ Hasnot had a bowel movt since last week.  In the ER, her vitals were stable and her FOBT was found to be positive. UA showed glucose >1000 and HgB 7.2 with MCV 77  Meds: No current facility-administered medications for this encounter.   Current Outpatient Prescriptions  Medication Sig Dispense Refill  . acetaminophen (TYLENOL) 650 MG CR tablet Take 650 mg by mouth every 8 (eight) hours as needed for pain.    Marland Kitchen aspirin 325 MG tablet Take 1 tablet (325 mg total) by mouth daily.    Marland Kitchen atorvastatin (LIPITOR) 20 MG tablet Take 1 tablet (20 mg total) by mouth daily. 30 tablet 5  . benazepril (LOTENSIN) 5 MG tablet TAKE 2 TABLETS BY MOUTH EVERY MORNING. 60 tablet 3  . clopidogrel (PLAVIX) 75 MG tablet Take 1 tablet (75 mg total) by mouth daily with breakfast. 30 tablet 3  . cyclobenzaprine (FLEXERIL) 5 MG tablet Take 1 tablet (5 mg total) by mouth at bedtime. 30 tablet 3  . diclofenac sodium (VOLTAREN) 1 % GEL APPLY 4 GRAMS TOPICALLY 4 TIMES DAILY. 100 g 2  . ferrous sulfate 325 (65 FE) MG tablet Take 1 tablet (325 mg  total) by mouth daily with breakfast. 30 tablet 3  . gabapentin (NEURONTIN) 300 MG capsule Take 1 capsule (300 mg total) by mouth 4 (four) times daily. 90 capsule 3  . glipiZIDE (GLUCOTROL) 10 MG tablet TAKE 1 TABLET BY MOUTH 2 TIMES DAILY BEFORE A MEAL. 60 tablet 3  . glucose blood (ACCU-CHEK AVIVA) test strip Use as instructed 100 each 12  . Insulin Glargine (LANTUS SOLOSTAR) 100 UNIT/ML Solostar Pen Inject 65 Units into the skin at bedtime.     Marland Kitchen ketorolac (ACULAR) 0.4 % SOLN Place 1 drop into the left eye 4 (four) times daily.    . Lancets (ACCU-CHEK MULTICLIX) lancets Use as instructed 100 each 12  . loratadine (CLARITIN) 10 MG tablet Take 1 tablet (10 mg total) by mouth daily as needed for allergies or rhinitis. 30  tablet 11  . metFORMIN (GLUMETZA) 500 MG (MOD) 24 hr tablet Take 1 tablet (500 mg total) by mouth 2 (two) times daily with a meal. 60 tablet 2  . ofloxacin (OCUFLOX) 0.3 % ophthalmic solution Place 1 drop into the left eye 4 (four) times daily.    Marland Kitchen oxymetazoline (AFRIN NASAL SPRAY) 0.05 % nasal spray Place 1 spray into both nostrils 2 (two) times daily as needed for congestion. 30 mL 0  . pantoprazole (PROTONIX) 40 MG tablet Take 1 tablet (40 mg total) by mouth daily. 30 tablet 11  . prednisoLONE acetate (PRED FORTE) 1 % ophthalmic suspension Place 1 drop into the left eye 4 (four) times daily.    . Vitamin D, Ergocalciferol, (DRISDOL) 50000 UNITS CAPS capsule Take 50,000 Units by mouth every 7 (seven) days. sunday    . [DISCONTINUED] colchicine 0.6 MG tablet Take two tab PO, then take 1 tab PO 1 hour later 6 tablet 0    Allergies: Allergies as of 02/25/2015  . (No Known Allergies)   Past Medical History  Diagnosis Date  . Staph infection   . Restless leg syndrome   . Chronic mesenteric ischemia     s/p SMA stent 06/22/13  . Coronary artery disease   . Hypertension   . Hyperlipidemia   . Heart murmur   . Asthma     "mild touch" (02/11/2015)  . Type II diabetes mellitus   . Anemia   . History of blood transfusion     "getting one today for my low HgB" (82/2016)  . GERD (gastroesophageal reflux disease)   . Headache     "weekly" (02/11/2015)  . Arthritis     "hands" (02/11/2015)  . Chronic lower back pain   . Diabetic peripheral neuropathy     "in my legs" (02/11/2015)   Past Surgical History  Procedure Laterality Date  . Endarterectomy Right 07/19/2013    Procedure: ENDARTERECTOMY CAROTID-RIGHT;  Surgeon: Angelia Mould, MD;  Location: [place];  Service: Vascular;  Laterality: Right;  . Insertion of retrograde carotid stent Left 03/05/2014    Procedure: 1)Cutdown of open exposure left common carotid; 2) Retrograde left common carotid stent; 3) Repair of left common carotid  artery.;  Surgeon: Elam Dutch, MD;  Location: [place];  Service: Vascular;  Laterality: Left;  . Arch aortogram N/A 02/11/2014    Procedure: ARCH AORTOGRAM;  Surgeon: Angelia Mould, MD;  Location: [facility] CATH LAB;  Service: Cardiovascular;  Laterality: N/A;  . Left heart catheterization with coronary angiogram N/A 10/03/2014    Procedure: LEFT HEART CATHETERIZATION WITH CORONARY ANGIOGRAM;  Surgeon: Burnell Blanks, MD;  Location: [facility] CATH  LAB;  Service: Cardiovascular;  Laterality: N/A;  . Cataract extraction w/ intraocular lens implant Left 02/03/2015  . Tonsillectomy  1950's  . Laparoscopic cholecystectomy    . Finger fracture surgery Left 2015    "they had to rebreak it and put pins in it"  . Fracture surgery    . Abdominal hysterectomy    . Coronary angioplasty      ? LCX stent ~ 2000 in [place], [place] with LCX stent [facility] 09/1999 for reported re-instent stenosis  . Esophagogastroduodenoscopy (egd) with propofol Left 02/13/2015    Procedure: ESOPHAGOGASTRODUODENOSCOPY (EGD) WITH PROPOFOL;  Surgeon: Ronald Lobo, MD;  Location: [place];  Service: Endoscopy;  Laterality: Left;  . Colonoscopy with propofol N/A 02/14/2015    Procedure: COLONOSCOPY WITH PROPOFOL;  Surgeon: Ronald Lobo, MD;  Location: [facility] ENDOSCOPY;  Service: Endoscopy;  Laterality: N/A;   Family History  Problem Relation Age of Onset  . Diabetes Mother   . Stroke Mother   . Hyperlipidemia Mother   . Cancer Father   . Heart disease Father     PVD and  CAROTID  . Heart attack Father   . Diabetes Sister   . Hypertension Sister   . Heart disease Sister     Before age 47  . Hyperlipidemia Sister   . Varicose Veins Sister   . Cancer Brother   . Cancer Maternal Aunt     breast cancer   . Diabetes Maternal Grandmother   . Diabetes Paternal Grandmother    Social History   Social History  . Marital Status: Divorced    Spouse Name: N/A  . Number of Children: N/A  . Years of Education: N/A    Occupational History  . Not on file.   Social History Main Topics  . Smoking status: Former Smoker -- 1.00 packs/day for 20 years    Types: Cigarettes    Quit date: 06/21/2013  . Smokeless tobacco: Never Used  . Alcohol Use: No  . Drug Use: No  . Sexual Activity: No   Other Topics Concern  . Not on file   Social History Narrative    Review of Systems: Please see HPI, others reviews and unremarkable  Physical Exam: Blood pressure 138/63, pulse 78, temperature 98.2 F (36.8 C), temperature source Oral, resp. rate 18, SpO2 92 %.  General: A&O, anxious, no head trauma HEENT: EOMI, PERRLA, sclera white, some conj pallor, no ear/nose/throat erythema Neck: supple, midline trachea, no cervical LAD CV: RRR, normal s1, s2, no m/r/g,  Resp: equal and symmetric breath sounds, no wheezing heard Abdomen: soft, nontender, nondistended, +BS in all 4 quadrants, no bruits appreciated, no masses, no HSM Skin: warm, dry, intact, no open lesions or rashes noted Extremities: pulses intact b/l, no edema, clubbing or cyanosis, no calf tenderness Neurologic: no focal neurological deficits    Lab results: @LABTEST @ CBC Latest Ref Rng 02/25/2015 02/25/2015 02/12/2015  WBC 4.0 - 10.5 K/uL - 6.2 5.5  Hemoglobin 12.0 - 15.0 g/dL 7.8(L) 7.2(L) 8.3(L)  Hematocrit 36.0 - 46.0 % 23.0(L) 23.5(L) 27.8(L)  Platelets 150 - 400 K/uL - 296 308    BMP Latest Ref Rng 02/25/2015 02/25/2015 02/12/2015  Glucose 65 - 99 mg/dL 443(H) 457(H) 53(L)  BUN 6 - 20 mg/dL 20 16 10   Creatinine 0.44 - 1.00 mg/dL 0.80 0.79 0.57  Sodium 135 - 145 mmol/L 134(L) 134(L) 140  Potassium 3.5 - 5.1 mmol/L 4.7 4.8 3.8  Chloride 101 - 111 mmol/L 102 103 110  CO2 22 -  32 mmol/L - 21(L) 25  Calcium 8.9 - 10.3 mg/dL - 8.7(L) 8.6(L)    Iron/TIBC/Ferritin/ %Sat CMP Latest Ref Rng 02/25/2015 02/25/2015 02/12/2015  Glucose 65 - 99 mg/dL 443(H) 457(H) 53(L)  BUN 6 - 20 mg/dL 20 16 10   Creatinine 0.44 - 1.00 mg/dL 0.80 0.79 0.57  Sodium  135 - 145 mmol/L 134(L) 134(L) 140  Potassium 3.5 - 5.1 mmol/L 4.7 4.8 3.8  Chloride 101 - 111 mmol/L 102 103 110  CO2 22 - 32 mmol/L - 21(L) 25  Calcium 8.9 - 10.3 mg/dL - 8.7(L) 8.6(L)  Total Protein 6.5 - 8.1 g/dL - 5.5(L) 5.4(L)  Total Bilirubin 0.3 - 1.2 mg/dL - 0.4 0.4  Alkaline Phos 38 - 126 U/L - 64 66  AST 15 - 41 U/L - 16 15  ALT 14 - 54 U/L - 12(L) 10(L)    CBG (last 3)  No results for input(s): GLUCAP in the last 72 hours.    Imaging results:  No results found.  Other results: EKG: normal EKG, normal sinus rhythm, unchanged from previous tracings.  Assessment & Plan by Problem: Active Problems:   DM (diabetes mellitus) type II uncontrolled, periph vascular disorder   Essential hypertension, benign   Chronic mesenteric ischemia   Near syncope   Anemia   GI bleed  80 Y O F with PMH of DM- Uncontrolled, HTN, Carotid artery stenosis, Chronic mesenteric ischemia. Presented to the Ed with complaints of dizziness of one day after being discharged 10 days ago.   Dizziness: likely due to existing Iron deficiency anemia- HgB in the Er 7.2 with MCV 77. Her workup from colonoscopy and EGD has been negative 10 days ago. Pt has not had a chance to start her iron. GI had recommended last time to do capsule endoscopy to check for any small bowel bleeding or AVM. FOBT positive, but no gross blood seen by patient. DSenied hematemesis. Neuro had cleared her for aspirin and plavix.  -Capsule endoscopy after GI consult tomorrow- NPO at midnight -CBC, BMET -Anemia Panel (Iron and TIBC)  -Type and screen -consider IV iron tomorrow - Transfuse if HgB <7 -B12, folate -Retic count   T2DM- stable last a1c 8.5 in June Lantus 65 units  HLD: lipitor   Dispo: Disposition is deferred at this time, awaiting improvement of current medical problems. Anticipated discharge in approximately 2 day(s).   The patient does have a current PCP (Lorayne Marek, MD) and does need an [facility] hospital  follow-up appointment after discharge.  The patient does not have transportation limitations that hinder transportation to clinic appointments.  Signed: Burgess Estelle, MD 02/25/2015, 5:46 PM

## 2015-02-25 NOTE — ED Notes (Signed)
Pt states she was recently discharged for GI bleed last Friday on 6th. States she feels like she is very lightheaded and feels like she is weak. States she has not had a BM since she got home.

## 2015-02-26 DIAGNOSIS — K551 Chronic vascular disorders of intestine: Secondary | ICD-10-CM

## 2015-02-26 DIAGNOSIS — Z955 Presence of coronary angioplasty implant and graft: Secondary | ICD-10-CM

## 2015-02-26 DIAGNOSIS — E1151 Type 2 diabetes mellitus with diabetic peripheral angiopathy without gangrene: Secondary | ICD-10-CM

## 2015-02-26 DIAGNOSIS — I10 Essential (primary) hypertension: Secondary | ICD-10-CM

## 2015-02-26 DIAGNOSIS — Z9889 Other specified postprocedural states: Secondary | ICD-10-CM

## 2015-02-26 DIAGNOSIS — Z7982 Long term (current) use of aspirin: Secondary | ICD-10-CM

## 2015-02-26 DIAGNOSIS — E785 Hyperlipidemia, unspecified: Secondary | ICD-10-CM

## 2015-02-26 LAB — CBC
HCT: 20.7 % — ABNORMAL LOW (ref 36.0–46.0)
HCT: 30.7 % — ABNORMAL LOW (ref 36.0–46.0)
Hemoglobin: 6.3 g/dL — CL (ref 12.0–15.0)
Hemoglobin: 9.9 g/dL — ABNORMAL LOW (ref 12.0–15.0)
MCH: 23.5 pg — ABNORMAL LOW (ref 26.0–34.0)
MCH: 24.6 pg — ABNORMAL LOW (ref 26.0–34.0)
MCHC: 30.4 g/dL (ref 30.0–36.0)
MCHC: 32.2 g/dL (ref 30.0–36.0)
MCV: 77.2 fL — ABNORMAL LOW (ref 78.0–100.0)
MCV: 76.2 fL — AB (ref 78.0–100.0)
PLATELETS: 264 10*3/uL (ref 150–400)
PLATELETS: 240 10*3/uL (ref 150–400)
RBC: 2.68 MIL/uL — ABNORMAL LOW (ref 3.87–5.11)
RBC: 4.03 MIL/uL (ref 3.87–5.11)
RDW: 19 % — AB (ref 11.5–15.5)
RDW: 18.2 % — AB (ref 11.5–15.5)
WBC: 5.1 10*3/uL (ref 4.0–10.5)
WBC: 5.6 10*3/uL (ref 4.0–10.5)

## 2015-02-26 LAB — BASIC METABOLIC PANEL
Anion gap: 8 (ref 5–15)
BUN: 15 mg/dL (ref 6–20)
CALCIUM: 8.5 mg/dL — AB (ref 8.9–10.3)
CO2: 24 mmol/L (ref 22–32)
Chloride: 105 mmol/L (ref 101–111)
Creatinine, Ser: 0.59 mg/dL (ref 0.44–1.00)
GFR calc Af Amer: >60 mL/min (ref >60)
GLUCOSE: 201 mg/dL — AB (ref 65–99)
POTASSIUM: 4.3 mmol/L (ref 3.5–5.1)
Sodium: 137 mmol/L (ref 135–145)

## 2015-02-26 LAB — GLUCOSE, CAPILLARY
GLUCOSE-CAPILLARY: 150 mg/dL — AB (ref 65–99)
GLUCOSE-CAPILLARY: 214 mg/dL — AB (ref 65–99)
Glucose-Capillary: 177 mg/dL — ABNORMAL HIGH (ref 65–99)
Glucose-Capillary: 230 mg/dL — ABNORMAL HIGH (ref 65–99)

## 2015-02-26 LAB — PREPARE RBC (CROSSMATCH)

## 2015-02-26 MED ORDER — ACETAMINOPHEN 500 MG PO TABS
500.0000 mg | ORAL_TABLET | Freq: Four times a day (QID) | ORAL | Status: DC | PRN
  Administered 2015-02-26 – 2015-02-28 (×6): 500 mg via ORAL
  Filled 2015-02-26 (×6): qty 1

## 2015-02-26 MED ORDER — INSULIN ASPART 100 UNIT/ML ~~LOC~~ SOLN
0.0000 [IU] | Freq: Three times a day (TID) | SUBCUTANEOUS | Status: DC
  Administered 2015-02-27: 7 [IU] via SUBCUTANEOUS
  Administered 2015-02-27: 2 [IU] via SUBCUTANEOUS
  Administered 2015-02-27: 3 [IU] via SUBCUTANEOUS
  Administered 2015-02-28: 2 [IU] via SUBCUTANEOUS
  Administered 2015-02-28 (×2): 3 [IU] via SUBCUTANEOUS
  Administered 2015-03-01: 7 [IU] via SUBCUTANEOUS

## 2015-02-26 MED ORDER — INSULIN ASPART 100 UNIT/ML ~~LOC~~ SOLN
0.0000 [IU] | Freq: Every day | SUBCUTANEOUS | Status: DC
Start: 2015-02-26 — End: 2015-03-01
  Administered 2015-02-26 – 2015-02-27 (×2): 2 [IU] via SUBCUTANEOUS

## 2015-02-26 MED ORDER — DEXTROSE-NACL 5-0.45 % IV SOLN
INTRAVENOUS | Status: DC
  Administered 2015-02-26: 11:00:00 via INTRAVENOUS
  Administered 2015-02-27: 100 mL/h via INTRAVENOUS

## 2015-02-26 MED ORDER — SODIUM CHLORIDE 0.9 % IV SOLN
Freq: Once | INTRAVENOUS | Status: AC
Start: 2015-02-26 — End: 2015-02-26
  Administered 2015-02-26: 13:00:00 via INTRAVENOUS

## 2015-02-26 NOTE — Progress Notes (Signed)
Subjective: Patient states that she has a headache and continues to have dizziness, lightheadedness but does not try to walk without assistance. She states that she has not had a bowel movement since discharge from last hospital visit.  Objective: Vital signs in last 24 hours: Filed Vitals:   02/25/15 2119 02/25/15 2121 02/25/15 2123 02/26/15 0606  BP: 137/63 121/52 144/44 127/45  Pulse: 93 94 90 83  Temp:    97.3 F (36.3 C)  TempSrc:    Oral  Resp:    18  Height:      Weight:      SpO2: 99% 99% 100% 98%   Weight change:   Intake/Output Summary (Last 24 hours) at 02/26/15 1132 Last data filed at 02/26/15 0500  Gross per 24 hour  Intake      0 ml  Output    500 ml  Net   -500 ml   General: resting in bed HEENT: conjunctival pallor present, EOMI, no scleral icterus Cardiac: RRR, systolic murmur heard in upper sternal borders Pulm: clear to auscultation bilaterally, moving normal volumes of air Abd: soft, nontender, nondistended, BS present Ext: warm and well perfused, no pedal edema Neuro: alert and oriented X3  Lab Results: Basic Metabolic Panel:  Recent Labs Lab 02/25/15 1800 02/26/15 0530  NA 136 137  K 4.4 4.3  CL 107 105  CO2 22 24  GLUCOSE 320* 201*  BUN 14 15  CREATININE 0.75 0.59  CALCIUM 8.4* 8.5*   Liver Function Tests:  Recent Labs Lab 02/25/15 1249 02/25/15 1800  AST 16 17  ALT 12* 11*  ALKPHOS 64 64  BILITOT 0.4 0.4  PROT 5.5* 5.1*  ALBUMIN 3.4* 3.3*   No results for input(s): LIPASE, AMYLASE in the last 168 hours. No results for input(s): AMMONIA in the last 168 hours. CBC:  Recent Labs Lab 02/25/15 1800 02/26/15 0530  WBC 4.3 5.1  NEUTROABS 2.5  --   HGB 8.9* 6.3*  HCT 30.5* 20.7*  MCV 72.3* 77.2*  PLT 343 264   Cardiac Enzymes: No results for input(s): CKTOTAL, CKMB, CKMBINDEX, TROPONINI in the last 168 hours. BNP: No results for input(s): PROBNP in the last 168 hours. D-Dimer: No results for input(s): DDIMER in the  last 168 hours. CBG:  Recent Labs Lab 02/25/15 2232  GLUCAP 233*   Hemoglobin A1C: No results for input(s): HGBA1C in the last 168 hours. Fasting Lipid Panel: No results for input(s): CHOL, HDL, LDLCALC, TRIG, CHOLHDL, LDLDIRECT in the last 168 hours. Thyroid Function Tests: No results for input(s): TSH, T4TOTAL, FREET4, T3FREE, THYROIDAB in the last 168 hours. Coagulation: No results for input(s): LABPROT, INR in the last 168 hours. Anemia Panel:  Recent Labs Lab 02/25/15 1804  VITAMINB12 297  FOLATE 30.7  FERRITIN 3*  TIBC 384  IRON 13*  RETICCTPCT 2.3   Urine Drug Screen: Drugs of Abuse  No results found for: LABOPIA, COCAINSCRNUR, LABBENZ, AMPHETMU, THCU, LABBARB  Alcohol Level: No results for input(s): ETH in the last 168 hours. Urinalysis:  Recent Labs Lab 02/25/15 1505  COLORURINE YELLOW  LABSPEC 1.025  PHURINE 5.5  GLUCOSEU >1000*  HGBUR NEGATIVE  BILIRUBINUR NEGATIVE  KETONESUR NEGATIVE  PROTEINUR NEGATIVE  UROBILINOGEN 0.2  NITRITE NEGATIVE  LEUKOCYTESUR NEGATIVE    Micro Results: No results found for this or any previous visit (from the past 240 hour(s)). Studies/Results: No results found. Medications: I have reviewed the patient's current medications. Scheduled Meds: . sodium chloride   Intravenous Once  .  atorvastatin  20 mg Oral Daily  . benazepril  5 mg Oral Daily  . gabapentin  300 mg Oral QID  . insulin glargine  10 Units Subcutaneous QHS  . ketorolac  1 drop Left Eye QID  . ofloxacin  1 drop Left Eye QID  . pantoprazole  40 mg Oral Daily  . prednisoLONE acetate  1 drop Left Eye QID  . sodium chloride  3 mL Intravenous Q12H  . sodium chloride  3 mL Intravenous Q12H   Continuous Infusions: . dextrose 5 % and 0.45% NaCl 100 mL/hr at 02/26/15 1109   PRN Meds:.sodium chloride, acetaminophen, loratadine, sodium chloride Assessment/Plan: Principal Problem:   Iron deficiency anemia due to chronic blood loss Active Problems:   DM  (diabetes mellitus) type II uncontrolled, periph vascular disorder   Essential hypertension, benign   Chronic mesenteric ischemia   Near syncope   Anemia   GI bleed  Iron deficiency anemia due to chronic blood loss: Patient's near syncope likely 2/2 to blood loss. Patient had EGD and colonoscopy on last hospital visit (discharge 02/14/2015) which were both completely normal with exception of a single colon polyp which was removed. 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 Patient denies seeing gross blood in stool or hematemesis, but FOBT positive in the ED. Patient was cleared to continue Aspirin and plavix use. Patient's Hgb in ED was 7.2, this morning it is 6.2. Typed and screened. We are considering her blood loss due to GI bleed, possible AVM, location may be in small bowel as EGD and colonoscopy were normal. -Hold aspirin and plavix for now -NPO -GI consulted, to evaluate for capsule endoscopy, appreciate any other recommendations -Transfuse 2 units pRBCs, CBC post transfusion  T2DM: -Lantus 65 Units at bedtime -CBG 4x daily, before meals and at bedtime  Chronic mesenteric ischemia: Carotid stent 02/2014, SMA 06/2013, on aspirin and plavix over a year, cleared for use by GI on last hospital visit. -Hold aspirin and plavix for now -atorvastatin 20 mg daily  HTN: -Benazepril 5 mg daily    Dispo: Disposition is deferred at this time, awaiting improvement of current medical problems.  Anticipated discharge in approximately 1-3 day(s).   The patient does have a current PCP (Lorayne Marek, MD) and does not need an [facility] hospital follow-up appointment after discharge.  The patient does not have transportation limitations that hinder transportation to clinic appointments.  .Services Needed at time of discharge: Y = Yes, Blank = No PT:   OT:   RN:   Equipment:   Other:     LOS: 1 day   Zada Finders, MD 02/26/2015, 11:32 AM

## 2015-02-26 NOTE — Care Management Note (Signed)
Case Management Note  Patient Details  Name: Sarah Alvarez MRN: [id number] Date of Birth: [birth year]  Subjective/Objective:     Patient lives with her brother and her sister, pta she is indep.  She goes to [facility] for her follow ups, she has an appt on 8/22 at 9 am.  She has transportation at dc.  NCM will cont to follow for dc needs.                Action/Plan:   Expected Discharge Date:                  Expected Discharge Plan:  Home/Self Care  In-House Referral:     Discharge planning Services  CM Consult  Post Acute Care Choice:    Choice offered to:     DME Arranged:    DME Agency:     HH Arranged:    HH Agency:     Status of Service:  In process, will continue to follow  Medicare Important Message Given:    Date Medicare IM Given:    Medicare IM give by:    Date Additional Medicare IM Given:    Additional Medicare Important Message give by:     If discussed at [place] of Stay Meetings, dates discussed:    Additional Comments:  Zenon Mayo, RN 02/26/2015, 11:12 AM

## 2015-02-26 NOTE — Consult Note (Signed)
Referring Provider: Dr. Evette Doffing Primary Care Physician:  Lorayne Marek, MD Primary Gastroenterologist:  Althia Forts  Reason for Consultation:  Iron Deficiency Anemia  HPI: Sarah Alvarez is a 64 y.o. female seen for a consult with iron deficiency anemia who had a EGD/colonoscopy in early August that was negative for a source of the melena (and anemia) she had at that time. She is on Plavix and Aspirin with carotid stents and coronary artery disease. She denies any visible bleeding and states she has not had a BM since her d/c from the hospital on 02/14/15. Denies abdominal pain, melena, hematochezia. Had nausea on this past Monday but denies vomiting. Hgb 7.2 on admit yesterday (8.3 on 02/12/15).   Past Medical History  Diagnosis Date  . Staph infection   . Restless leg syndrome   . Chronic mesenteric ischemia     s/p SMA stent 06/22/13  . Coronary artery disease   . Hypertension   . Hyperlipidemia   . Heart murmur   . Asthma     "mild touch" (02/11/2015)  . Anemia   . GERD (gastroesophageal reflux disease)   . Chronic lower back pain   . Type II diabetes mellitus   . Diabetic peripheral neuropathy     "in my legs" (02/11/2015)  . Daily headache   . Arthritis     "hands" (02/25/2015)    Past Surgical History  Procedure Laterality Date  . Endarterectomy Right 07/19/2013    Procedure: ENDARTERECTOMY CAROTID-RIGHT;  Surgeon: Angelia Mould, MD;  Location: [place];  Service: Vascular;  Laterality: Right;  . Insertion of retrograde carotid stent Left 03/05/2014    Procedure: 1)Cutdown of open exposure left common carotid; 2) Retrograde left common carotid stent; 3) Repair of left common carotid artery.;  Surgeon: Elam Dutch, MD;  Location: [place];  Service: Vascular;  Laterality: Left;  . Arch aortogram N/A 02/11/2014    Procedure: ARCH AORTOGRAM;  Surgeon: Angelia Mould, MD;  Location: [facility] CATH LAB;  Service: Cardiovascular;  Laterality: N/A;  . Left heart catheterization  with coronary angiogram N/A 10/03/2014    Procedure: LEFT HEART CATHETERIZATION WITH CORONARY ANGIOGRAM;  Surgeon: Burnell Blanks, MD;  Location: [facility] CATH LAB;  Service: Cardiovascular;  Laterality: N/A;  . Cataract extraction w/ intraocular lens implant Left 02/03/2015  . Tonsillectomy  1950's  . Finger fracture surgery Left 2015    "they had to rebreak it and put pins in it"  . Fracture surgery    . Abdominal hysterectomy    . Coronary angioplasty      ? LCX stent ~ 2000 in [place], [place] with LCX stent [place] 09/1999 for reported re-instent stenosis  . Esophagogastroduodenoscopy (egd) with propofol Left 02/13/2015    Procedure: ESOPHAGOGASTRODUODENOSCOPY (EGD) WITH PROPOFOL;  Surgeon: Ronald Lobo, MD;  Location: [place];  Service: Endoscopy;  Laterality: Left;  . Colonoscopy with propofol N/A 02/14/2015    Procedure: COLONOSCOPY WITH PROPOFOL;  Surgeon: Ronald Lobo, MD;  Location: [facility] ENDOSCOPY;  Service: Endoscopy;  Laterality: N/A;  . Laparoscopic cholecystectomy      Prior to Admission medications   Medication Sig Start Date End Date Taking? Authorizing Provider  acetaminophen (TYLENOL) 650 MG CR tablet Take 650 mg by mouth every 8 (eight) hours as needed for pain.   Yes Historical Provider, MD  aspirin 325 MG tablet Take 1 tablet (325 mg total) by mouth daily. 06/25/13  Yes Donne Hazel, MD  atorvastatin (LIPITOR) 20 MG tablet Take 1 tablet (20  mg total) by mouth daily. 08/13/14  Yes Deepak Advani, MD  benazepril (LOTENSIN) 5 MG tablet TAKE 2 TABLETS BY MOUTH EVERY MORNING. 12/16/14  Yes Lorayne Marek, MD  clopidogrel (PLAVIX) 75 MG tablet Take 1 tablet (75 mg total) by mouth daily with breakfast. 12/16/14  Yes Deepak Advani, MD  cyclobenzaprine (FLEXERIL) 5 MG tablet Take 1 tablet (5 mg total) by mouth at bedtime. 12/16/14  Yes Deepak Advani, MD  diclofenac sodium (VOLTAREN) 1 % GEL APPLY 4 GRAMS TOPICALLY 4 TIMES DAILY. 12/16/14  Yes Lorayne Marek, MD  ferrous sulfate 325 (65 FE) MG  tablet Take 1 tablet (325 mg total) by mouth daily with breakfast. 02/14/15  Yes Zada Finders, MD  gabapentin (NEURONTIN) 300 MG capsule Take 1 capsule (300 mg total) by mouth 4 (four) times daily. 12/30/14  Yes Trula Slade, DPM  glipiZIDE (GLUCOTROL) 10 MG tablet TAKE 1 TABLET BY MOUTH 2 TIMES DAILY BEFORE A MEAL. 12/16/14  Yes Deepak Advani, MD  glucose blood (ACCU-CHEK AVIVA) test strip Use as instructed 06/19/14  Yes Deepak Advani, MD  Insulin Glargine (LANTUS SOLOSTAR) 100 UNIT/ML Solostar Pen Inject 65 Units into the skin at bedtime.    Yes Historical Provider, MD  ketorolac (ACULAR) 0.4 % SOLN Place 1 drop into the left eye 4 (four) times daily.   Yes Historical Provider, MD  Lancets (ACCU-CHEK MULTICLIX) lancets Use as instructed 06/19/14  Yes Deepak Advani, MD  loratadine (CLARITIN) 10 MG tablet Take 1 tablet (10 mg total) by mouth daily as needed for allergies or rhinitis. 01/22/14  Yes Lorayne Marek, MD  metFORMIN (GLUMETZA) 500 MG (MOD) 24 hr tablet Take 1 tablet (500 mg total) by mouth 2 (two) times daily with a meal. 01/22/14  Yes Deepak Advani, MD  ofloxacin (OCUFLOX) 0.3 % ophthalmic solution Place 1 drop into the left eye 4 (four) times daily.   Yes Historical Provider, MD  oxymetazoline (AFRIN NASAL SPRAY) 0.05 % nasal spray Place 1 spray into both nostrils 2 (two) times daily as needed for congestion. 01/22/14  Yes Deepak Advani, MD  pantoprazole (PROTONIX) 40 MG tablet Take 1 tablet (40 mg total) by mouth daily. 02/14/15  Yes Zada Finders, MD  prednisoLONE acetate (PRED FORTE) 1 % ophthalmic suspension Place 1 drop into the left eye 4 (four) times daily.   Yes Historical Provider, MD  Vitamin D, Ergocalciferol, (DRISDOL) 50000 UNITS CAPS capsule Take 50,000 Units by mouth every 7 (seven) days. sunday    Historical Provider, MD    Scheduled Meds: . atorvastatin  20 mg Oral Daily  . benazepril  5 mg Oral Daily  . gabapentin  300 mg Oral QID  . insulin glargine  10 Units Subcutaneous  QHS  . ketorolac  1 drop Left Eye QID  . ofloxacin  1 drop Left Eye QID  . pantoprazole  40 mg Oral Daily  . prednisoLONE acetate  1 drop Left Eye QID  . sodium chloride  3 mL Intravenous Q12H  . sodium chloride  3 mL Intravenous Q12H   Continuous Infusions: . dextrose 5 % and 0.45% NaCl 100 mL/hr at 02/26/15 1109   PRN Meds:.sodium chloride, acetaminophen, loratadine, sodium chloride  Allergies as of 02/25/2015  . (No Known Allergies)    Family History  Problem Relation Age of Onset  . Diabetes Mother   . Stroke Mother   . Hyperlipidemia Mother   . Cancer Father   . Heart disease Father     PVD and  CAROTID  .  Heart attack Father   . Diabetes Sister   . Hypertension Sister   . Heart disease Sister     Before age 7  . Hyperlipidemia Sister   . Varicose Veins Sister   . Cancer Brother   . Cancer Maternal Aunt     breast cancer   . Diabetes Maternal Grandmother   . Diabetes Paternal Grandmother     Social History   Social History  . Marital Status: Divorced    Spouse Name: N/A  . Number of Children: N/A  . Years of Education: N/A   Occupational History  . Not on file.   Social History Main Topics  . Smoking status: Former Smoker -- 1.00 packs/day for 20 years    Types: Cigarettes    Quit date: 06/21/2013  . Smokeless tobacco: Never Used  . Alcohol Use: No  . Drug Use: No  . Sexual Activity: No   Other Topics Concern  . Not on file   Social History Narrative    Review of Systems: All negative except as stated above in HPI.  Physical Exam: Vital signs: Filed Vitals:   02/26/15 1310  BP: 145/6  Pulse: 81  Temp: 98.8 F (37.1 C)  Resp: 20     General:   Lethargic, elderly, Well-developed, well-nourished, pleasant and cooperative in NAD Head: atraumatic Eyes: anicteric sclera ENT: oropharynx clear Neck: supple, nontender Lungs:  Clear throughout to auscultation.   No wheezes, crackles, or rhonchi. No acute distress. Heart:  Regular rate  and rhythm; no murmurs, clicks, rubs,  or gallops. Abdomen: soft, nontender, nondistended, +BS  Rectal:  Deferred Ext: pedal pulses intact, no edema Skin: no rash  GI:  Lab Results:  Recent Labs  02/25/15 1249 02/25/15 1653 02/25/15 1800 02/26/15 0530  WBC 6.2  --  4.3 5.1  HGB 7.2* 7.8* 8.9* 6.3*  HCT 23.5* 23.0* 30.5* 20.7*  PLT 296  --  343 264   BMET  Recent Labs  02/25/15 1249 02/25/15 1653 02/25/15 1800 02/26/15 0530  NA 134* 134* 136 137  K 4.8 4.7 4.4 4.3  CL 103 102 107 105  CO2 21*  --  22 24  GLUCOSE 457* 443* 320* 201*  BUN 16 20 14 15   CREATININE 0.79 0.80 0.75 0.59  CALCIUM 8.7*  --  8.4* 8.5*   LFT  Recent Labs  02/25/15 1800  PROT 5.1*  ALBUMIN 3.3*  AST 17  ALT 11*  ALKPHOS 64  BILITOT 0.4   PT/INR No results for input(s): LABPROT, INR in the last 72 hours.   Studies/Results: No results found.  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 Doubt small bowel ulcers. Malignancy less likely but possible. Capsule endoscopy tomorrow morning. Clear liquid diet today. NPO p MN. Supportive care.     LOS: 1 day   [place] C.  02/26/2015, 1:51 PM  Pager [phone]  If no answer or after 5 PM call [phone]

## 2015-02-26 NOTE — Progress Notes (Signed)
Results for PIHU, BASIL (MRN [id number]) as of 02/26/2015 14:28  Ref. Range 02/25/2015 22:32 02/26/2015 09:01 02/26/2015 12:35  Glucose-Capillary Latest Ref Range: 65-99 mg/dL 233 (H) 177 (H) 150 (H)  Noted that insulin orders are for Lantus 10 units every HS.  Recommend adding Novolog SENSITIVE correction scale TID & HS while in the hospital and when eating. Noted that patient takes Lantus 65 units daily at home. Will continue to follow while in hospital. Harvel Ricks RN BSN CDE

## 2015-02-27 ENCOUNTER — Encounter (HOSPITAL_COMMUNITY): Payer: Self-pay | Admitting: *Deleted

## 2015-02-27 ENCOUNTER — Encounter (HOSPITAL_COMMUNITY)
Admission: EM | Disposition: A | Discharge status: Home / Self Care | Payer: Self-pay | Source: Home / Self Care | Attending: Student in an Organized Health Care Education/Training Program

## 2015-02-27 HISTORY — PX: GIVENS CAPSULE STUDY: SHX5432

## 2015-02-27 LAB — TYPE AND SCREEN
ABO/RH(D): O POS
ANTIBODY SCREEN: NEGATIVE
UNIT DIVISION: 0
UNIT DIVISION: 0

## 2015-02-27 LAB — GLUCOSE, CAPILLARY
Glucose-Capillary: 169 mg/dL — ABNORMAL HIGH (ref 65–99)
Glucose-Capillary: 237 mg/dL — ABNORMAL HIGH (ref 65–99)
Glucose-Capillary: 313 mg/dL — ABNORMAL HIGH (ref 65–99)

## 2015-02-27 SURGERY — IMAGING PROCEDURE, GI TRACT, INTRALUMINAL, VIA CAPSULE
Anesthesia: LOCAL

## 2015-02-27 MED ORDER — SODIUM CHLORIDE 0.9 % IV SOLN
510.0000 mg | Freq: Once | INTRAVENOUS | Status: AC
  Administered 2015-02-27: 510 mg via INTRAVENOUS
  Filled 2015-02-27: qty 17

## 2015-02-27 SURGICAL SUPPLY — 1 items: TOWEL COTTON PACK 4EA (MISCELLANEOUS) ×4 IMPLANT

## 2015-02-27 NOTE — Progress Notes (Signed)
Subjective: Patient states that she is feeling well today and had a small bowel movement of solid brown stool, denies any visible blood. She states she began feeling better after blood transfusion yesterday. Is NPO and starting capsule endoscopy. Objective: Vital signs in last 24 hours: Filed Vitals:   02/27/15 0529 02/27/15 1030 02/27/15 1038 02/27/15 1117  BP: 135/58 154/123 174/72 181/70  Pulse: 76 80 77 80  Temp: 97.9 F (36.6 C)     TempSrc: Oral     Resp: 18     Height:      Weight:      SpO2: 95%      Weight change:   Intake/Output Summary (Last 24 hours) at 02/27/15 1127 Last data filed at 02/27/15 1049  Gross per 24 hour  Intake   3040 ml  Output   1800 ml  Net   1240 ml   General: resting in bed HEENT: no conjunctival pallor present, EOMI, no scleral icterus Cardiac: RRR, systolic murmur heard in upper sternal borders Pulm: clear to auscultation bilaterally, moving normal volumes of air Abd: soft, nontender, nondistended, BS present Ext: warm and well perfused, no pedal edema Neuro: alert and oriented X3  Lab Results: Basic Metabolic Panel:  Recent Labs Lab 02/25/15 1800 02/26/15 0530  NA 136 137  K 4.4 4.3  CL 107 105  CO2 22 24  GLUCOSE 320* 201*  BUN 14 15  CREATININE 0.75 0.59  CALCIUM 8.4* 8.5*   Liver Function Tests:  Recent Labs Lab 02/25/15 1249 02/25/15 1800  AST 16 17  ALT 12* 11*  ALKPHOS 64 64  BILITOT 0.4 0.4  PROT 5.5* 5.1*  ALBUMIN 3.4* 3.3*   No results for input(s): LIPASE, AMYLASE in the last 168 hours. No results for input(s): AMMONIA in the last 168 hours. CBC:  Recent Labs Lab 02/25/15 1800 02/26/15 0530 02/26/15 2006  WBC 4.3 5.1 5.6  NEUTROABS 2.5  --   --   HGB 8.9* 6.3* 9.9*  HCT 30.5* 20.7* 30.7*  MCV 72.3* 77.2* 76.2*  PLT 343 264 240   Cardiac Enzymes: No results for input(s): CKTOTAL, CKMB, CKMBINDEX, TROPONINI in the last 168 hours. BNP: No results for input(s): PROBNP in the last 168  hours. D-Dimer: No results for input(s): DDIMER in the last 168 hours. CBG:  Recent Labs Lab 02/25/15 2232 02/26/15 0901 02/26/15 1235 02/26/15 1716 02/26/15 2148  GLUCAP 233* 177* 150* 214* 230*   Hemoglobin A1C: No results for input(s): HGBA1C in the last 168 hours. Fasting Lipid Panel: No results for input(s): CHOL, HDL, LDLCALC, TRIG, CHOLHDL, LDLDIRECT in the last 168 hours. Thyroid Function Tests: No results for input(s): TSH, T4TOTAL, FREET4, T3FREE, THYROIDAB in the last 168 hours. Coagulation: No results for input(s): LABPROT, INR in the last 168 hours. Anemia Panel:  Recent Labs Lab 02/25/15 1804  VITAMINB12 297  FOLATE 30.7  FERRITIN 3*  TIBC 384  IRON 13*  RETICCTPCT 2.3   Urine Drug Screen: Drugs of Abuse  No results found for: LABOPIA, COCAINSCRNUR, LABBENZ, AMPHETMU, THCU, LABBARB  Alcohol Level: No results for input(s): ETH in the last 168 hours. Urinalysis:  Recent Labs Lab 02/25/15 1505  COLORURINE YELLOW  LABSPEC 1.025  PHURINE 5.5  GLUCOSEU >1000*  HGBUR NEGATIVE  BILIRUBINUR NEGATIVE  KETONESUR NEGATIVE  PROTEINUR NEGATIVE  UROBILINOGEN 0.2  NITRITE NEGATIVE  LEUKOCYTESUR NEGATIVE    Micro Results: No results found for this or any previous visit (from the past 240 hour(s)). Studies/Results: No results  found. Medications: I have reviewed the patient's current medications. Scheduled Meds: . atorvastatin  20 mg Oral Daily  . benazepril  5 mg Oral Daily  . gabapentin  300 mg Oral QID  . insulin aspart  0-5 Units Subcutaneous QHS  . insulin aspart  0-9 Units Subcutaneous TID WC  . insulin glargine  10 Units Subcutaneous QHS  . ketorolac  1 drop Left Eye QID  . ofloxacin  1 drop Left Eye QID  . pantoprazole  40 mg Oral Daily  . prednisoLONE acetate  1 drop Left Eye QID  . sodium chloride  3 mL Intravenous Q12H   Continuous Infusions: . dextrose 5 % and 0.45% NaCl 100 mL/hr (02/27/15 0304)   PRN Meds:.acetaminophen,  loratadine Assessment/Plan: Principal Problem:   Iron deficiency anemia due to chronic blood loss Active Problems:   DM (diabetes mellitus) type II uncontrolled, periph vascular disorder   Essential hypertension, benign   Chronic mesenteric ischemia   Near syncope   Symptomatic anemia   GI bleed  Iron deficiency anemia due to chronic blood loss: Patient's near syncope likely 2/2 to blood loss. Patient had EGD and colonoscopy on last hospital visit (discharge 02/14/2015) which were both completely normal with exception of a single colon polyp which was removed. 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 Patient denies seeing gross blood in stool or hematemesis, but FOBT positive in the ED. Patient was cleared to continue Aspirin and plavix use. Patient's Hgb in ED was 7.2, this morning it is 6.2. Typed and screened. We are considering her blood loss due to GI bleed, possible AVM, location may be in small bowel as EGD and colonoscopy were normal. -Hold aspirin and plavix for now, will start only Plavix on discharge -GI following for which we are appreciative. Capsule endoscopy in process. -Transfused 2 units pRBCs on 8/17, Hgb up to 9.9 -Will need to follow up with GI outpatient -Patient had iron deficit of approximately 1300 before blood transfusion, given 400 mg by transfusion of 2 PRBCs, will give additional 510 mg IV ferraheme today. On discharge, will start oral iron once a day.   T2DM: -Lantus 65 Units at bedtime -SSI -CBG 4x daily, before meals and at bedtime  Chronic mesenteric ischemia: Carotid stent 02/2014, SMA 06/2013, on aspirin and plavix over a year, cleared for use by GI on last hospital visit.  -Hold aspirin and plavix for now, continue only plavix on discharge -atorvastatin 20 mg daily  HTN: -Benazepril 5 mg daily    Dispo: Disposition is deferred at this time, awaiting improvement of current medical problems.  Anticipated discharge in approximately 1 day(s).   The  patient does have a current PCP (Lorayne Marek, MD) and does not need an [facility] hospital follow-up appointment after discharge.  The patient does not have transportation limitations that hinder transportation to clinic appointments.  .Services Needed at time of discharge: Y = Yes, Blank = No PT:   OT:   RN:   Equipment:   Other:     LOS: 2 days   Zada Finders, MD 02/27/2015, 11:27 AM

## 2015-02-27 NOTE — Progress Notes (Signed)
Results for HADDY, MULLINAX (MRN [id number]) as of 02/27/2015 13:06  Ref. Range 02/26/2015 12:35 02/26/2015 17:16 02/26/2015 21:48 02/27/2015 08:12 02/27/2015 11:56  Glucose-Capillary Latest Ref Range: 65-99 mg/dL 150 (H) 214 (H) 230 (H) 169 (H) 237 (H)  Noted that postprandial blood sugars continue to be elevated. Recommend increasing Novolog correction scale to MODERATE TID & HS if eating. Will continue to follow while in hospital. Harvel Ricks RN BSN CDE

## 2015-02-27 NOTE — Plan of Care (Signed)
Problem: Phase I Progression Outcomes Goal: Initial discharge plan identified Outcome: Completed/Met Date Met:  02/27/15 To return home with siblings

## 2015-02-28 ENCOUNTER — Inpatient Hospital Stay (HOSPITAL_COMMUNITY): Payer: Medicaid Other

## 2015-02-28 ENCOUNTER — Encounter (HOSPITAL_COMMUNITY): Payer: Self-pay | Admitting: Gastroenterology

## 2015-02-28 DIAGNOSIS — D509 Iron deficiency anemia, unspecified: Secondary | ICD-10-CM | POA: Insufficient documentation

## 2015-02-28 LAB — CBC
HEMATOCRIT: 29.6 % — AB (ref 36.0–46.0)
HEMOGLOBIN: 9.2 g/dL — AB (ref 12.0–15.0)
MCH: 23.8 pg — ABNORMAL LOW (ref 26.0–34.0)
MCHC: 31.1 g/dL (ref 30.0–36.0)
MCV: 76.5 fL — AB (ref 78.0–100.0)
Platelets: 296 10*3/uL (ref 150–400)
RBC: 3.87 MIL/uL (ref 3.87–5.11)
RDW: 18.2 % — AB (ref 11.5–15.5)
WBC: 4.8 10*3/uL (ref 4.0–10.5)

## 2015-02-28 LAB — GLUCOSE, CAPILLARY
GLUCOSE-CAPILLARY: 277 mg/dL — AB (ref 65–99)
GLUCOSE-CAPILLARY: 154 mg/dL — AB (ref 65–99)
Glucose-Capillary: 210 mg/dL — ABNORMAL HIGH (ref 65–99)
Glucose-Capillary: 220 mg/dL — ABNORMAL HIGH (ref 65–99)
Glucose-Capillary: 112 mg/dL — ABNORMAL HIGH (ref 65–99)

## 2015-02-28 LAB — LACTATE DEHYDROGENASE: LDH: 185 U/L (ref 98–192)

## 2015-02-28 MED ORDER — INSULIN GLARGINE 100 UNIT/ML ~~LOC~~ SOLN
20.0000 [IU] | Freq: Every day | SUBCUTANEOUS | Status: DC
  Administered 2015-02-28: 20 [IU] via SUBCUTANEOUS
  Filled 2015-02-28 (×2): qty 0.2

## 2015-02-28 MED ORDER — IOHEXOL 300 MG/ML  SOLN
100.0000 mL | Freq: Once | INTRAMUSCULAR | Status: AC | PRN
  Administered 2015-02-28: 100 mL via INTRAVENOUS

## 2015-02-28 NOTE — Discharge Summary (Signed)
Name: Sarah Alvarez MRN: [id number] DOB: 13-Dec-1950 64 y.o. PCP: Lorayne Marek, MD  Date of Admission: 02/25/2015  2:39 PM Date of Discharge: 03/01/2015 Attending Physician: Axel Filler, MD  Discharge Diagnosis: Principal Problem:   Iron deficiency anemia due to chronic blood loss Active Problems:   DM (diabetes mellitus) type II uncontrolled, periph vascular disorder   Essential hypertension, benign   Chronic mesenteric ischemia   Near syncope   Symptomatic anemia   GI bleed   Iron (Fe) deficiency anemia  Discharge Medications:   Medication List    STOP taking these medications        aspirin 325 MG tablet      TAKE these medications        accu-chek multiclix lancets  Use as instructed     acetaminophen 650 MG CR tablet  Commonly known as:  TYLENOL  Take 650 mg by mouth every 8 (eight) hours as needed for pain.     atorvastatin 20 MG tablet  Commonly known as:  LIPITOR  Take 1 tablet (20 mg total) by mouth daily.     benazepril 5 MG tablet  Commonly known as:  LOTENSIN  TAKE 2 TABLETS BY MOUTH EVERY MORNING.     clopidogrel 75 MG tablet  Commonly known as:  PLAVIX  Take 1 tablet (75 mg total) by mouth daily with breakfast.     cyclobenzaprine 5 MG tablet  Commonly known as:  FLEXERIL  Take 1 tablet (5 mg total) by mouth at bedtime.     diclofenac sodium 1 % Gel  Commonly known as:  VOLTAREN  APPLY 4 GRAMS TOPICALLY 4 TIMES DAILY.     ferrous sulfate 325 (65 FE) MG tablet  Take 1 tablet (325 mg total) by mouth daily with breakfast.     gabapentin 300 MG capsule  Commonly known as:  NEURONTIN  Take 1 capsule (300 mg total) by mouth 4 (four) times daily.     glipiZIDE 10 MG tablet  Commonly known as:  GLUCOTROL  TAKE 1 TABLET BY MOUTH 2 TIMES DAILY BEFORE A MEAL.     glucose blood test strip  Commonly known as:  ACCU-CHEK AVIVA  Use as instructed     ketorolac 0.4 % Soln  Commonly known as:  ACULAR  Place 1 drop into the left eye  4 (four) times daily.     LANTUS SOLOSTAR 100 UNIT/ML Solostar Pen  Generic drug:  Insulin Glargine  Inject 65 Units into the skin at bedtime.     loratadine 10 MG tablet  Commonly known as:  CLARITIN  Take 1 tablet (10 mg total) by mouth daily as needed for allergies or rhinitis.     metFORMIN 500 MG (MOD) 24 hr tablet  Commonly known as:  GLUMETZA  Take 1 tablet (500 mg total) by mouth 2 (two) times daily with a meal.     ofloxacin 0.3 % ophthalmic solution  Commonly known as:  OCUFLOX  Place 1 drop into the left eye 4 (four) times daily.     oxymetazoline 0.05 % nasal spray  Commonly known as:  AFRIN NASAL SPRAY  Place 1 spray into both nostrils 2 (two) times daily as needed for congestion.     pantoprazole 40 MG tablet  Commonly known as:  PROTONIX  Take 1 tablet (40 mg total) by mouth daily.     prednisoLONE acetate 1 % ophthalmic suspension  Commonly known as:  PRED FORTE  Place 1 drop  into the left eye 4 (four) times daily.     Vitamin D (Ergocalciferol) 50000 UNITS Caps capsule  Commonly known as:  DRISDOL  Take 50,000 Units by mouth every 7 (seven) days. sunday        Disposition and follow-up:   Sarah Alvarez was discharged from [facility] in Stable condition.  At the hospital follow up visit please address:  1.  Please repeat CBC.  Would recommened rechecking CBCs on a 1-2 week basis to ensure stability and determine need for furhter transfusions.  Please also assess if she was able to pick up her oral iron supplements.  She will also need to follow up with GI.  2.  Labs / imaging needed at time of follow-up: CBC  3.  Pending labs/ test needing follow-up: none  Follow-up Appointments: Follow-up Information    Follow up with Minerva Ends, MD. Go on 03/03/2015.   Specialty:  Family Medicine   Why:  at [facility] information:   [address] [place] [address] [phone]       Discharge Instructions: Discharge  Instructions    Call MD for:  difficulty breathing, headache or visual disturbances    Complete by:  As directed      Call MD for:  extreme fatigue    Complete by:  As directed      Call MD for:  persistant dizziness or light-headedness    Complete by:  As directed      Diet - low sodium heart healthy    Complete by:  As directed      Increase activity slowly    Complete by:  As directed            Consultations: Treatment Team:  Wilford Corner, MD Estanislado Emms, MD  Procedures Performed:  Ct Entero Abd/pelvis W/cm  03/01/2015   CLINICAL DATA:  Iron deficiency anemia. Abdominal swelling and nausea for several weeks. GI bleeding. Dizziness.  EXAM: CT ABDOMEN AND PELVIS WITH CONTRAST (ENTEROGRAPHY)  TECHNIQUE: Multidetector CT of the abdomen and pelvis during bolus administration of intravenous contrast. Negative oral contrast VoLumen was given.  CONTRAST:  122mL OMNIPAQUE IOHEXOL 300 MG/ML  SOLN  COMPARISON:  07/23/2014  FINDINGS: Lower Chest: No acute findings.  Hepatobiliary: No masses or other significant abnormality identified. Prior cholecystectomy noted. No evidence of biliary dilatation.  Pancreas: No mass, inflammatory changes, or other significant abnormality identified.  Spleen:  Within normal limits in size and appearance.  Adrenals:  No masses identified.  Kidneys/Urinary Tract:  No evidence of masses or hydronephrosis.  Stomach/Bowel/Peritoneum: No evidence of small bowel wall thickening or dilatation. No evidence of abnormal mucosal enhancement or mesenteric inflammatory changes. No evidence of fistulization or extraluminal gas or fluid collections. The terminal ileum is normal in appearance. No other areas of bowel wall thickening identified. Normal appendix visualized.  Vascular/Lymphatic: No pathologically enlarged lymph nodes identified. No abdominal aortic aneurysm or other significant retroperitoneal abnormality demonstrated.  Reproductive: Prior hysterectomy noted. Adnexal  regions are unremarkable in appearance.  Other:  None.  Musculoskeletal:  No suspicious bone lesions identified.  IMPRESSION: Negative. No evidence of inflammatory bowel disease or other significant abnormality.   Electronically Signed   By: Earle Gell M.D.   On: 03/01/2015 07:27    2D Echo: TTE 09/26/2014 - Left ventricle: The cavity size was normal. There was moderateconcentric hypertrophy. Systolic function was normal. Theestimated ejection fraction was in the range of 60% to  65%. Wallmotion was normal; there were no regional wall motionabnormalities. Doppler parameters are consistent withpseudonormal left ventricular relaxation (grade 2 diastolicdysfunction). The E/e&' ratio is >15, suggesting elevated LVfilling pressure. - Aortic valve: Trileaflet. Sclerosis without stenosis. - Mitral valve: Mildly thickened leaflets . There was moderateregurgitation. - Left atrium: The atrium was at the upper limits of normal insize. - Atrial septum: No defect or patent foramen ovale was identified. - Inferior vena cava: The vessel was normal in size. Therespirophasic diameter changes were in the normal range (>= 50%),consistent with normal central venous pressure.  Cardiac Cath: 10/03/2014 1. Patent stent mid Circumflex with mild restenosis 2. Diffuse non-obstructive disease in the RCA and LAD   Admission HPI: Ms. Denunzio is a 64 year old woman with HTN, IDDM, grade 2 diastolic cardiac dysfunction, and advanced vascular disease s/p b/l carotid artery surgery and subseq left carotid stent January 2015, CAD with MI status post placement of a coronary stent March 2001, mesenteric vascular disease status post mesenteric vascular stents to the superior mesenteric artery July 2015 who is here for dizziness and near syncope.   She was feeling fine until yesterday morning when she started to feel a little dizzy. She was doing her usual ADLs and then she she felt dizzy, and felt like she was about to pass  out. And held onto the chair but no LOC. Then this morning, she ate the breakfast, And was reading the paper when she became dizzy again. She checked her CBGs which was 291. She takes 65 units of Lantus, metformin and glipizide. She also notes a weight loss of about 70 lbs over the last year.   She was admitted on August 2nd with complaints of dizziness and passing black stool mixed with blood, underwent EGD and colonoscopy. EGD was normal- no ulcers and no antiplatelet related gastritis, colonoscopy revealed benign polyp. Her HgB was stable on discharge and was 8.3 and she did not require any blood transfusion. The etiology of the bleeding was undetermined and we had thought that we can do capsule endoscopy to rule out small bowel AVM.   She was put on iron supplement which she did not have a chance to start/ pick up.   She does not report any constitutional symptoms, and other ROS unremarkable, no gross blood in stool. No n/v/d/ Hasnot had a bowel movt since last week.  In the ER, her vitals were stable and her FOBT was found to be positive. UA showed glucose >1000 and HgB 7.2 with MCV 77  Hospital Course by problem list: Principal Problem:   Iron deficiency anemia due to chronic blood loss Active Problems:   DM (diabetes mellitus) type II uncontrolled, periph vascular disorder   Essential hypertension, benign   Chronic mesenteric ischemia   Near syncope   Symptomatic anemia   GI bleed   Iron (Fe) deficiency anemia   Iron deficiency anemia due to chronic blood loss: Patient returned to hospital after recent visit (discharge 02/14/2015) for similar symptoms. EGD and Colonoscopy were done at that time which were completely normal with exception of a single colon polyp which was removed and found to be tubular adenoma. Patient presented this time on 02/25/2015 with complaints of near syncope, no gross blood seen in stool or hematemesis. She was FOBT positive and had Hgb of 7.2 in the ED. Her  aspirin and plavix were held. Blood typed and screened. Hgb next morning was 6.2. She was transfused 2 Units PRBCs, with improvement in symptoms and Hgb to 9.9.  She was found to have an iron deficit of ~1300 and was given 520 mg IV ferraheme in addition to the roughly 400 mg she received by pRBC transfusion. Patient started capsule endoscopy on 8/18. Results were inadequate and patient underwent CT enterography on 8/19 which showed no evidence of mass or ulcers.  She had no evidence of further bleeding and we suspect she has a small AVM that is causing her IDA.  She has close follow up with PCP on Monday where a CBC can be checked and we would recommend continued oral supplementation of Iron as well as consideration of outpatient capsule study by GI to further clarify her source of bleeding.  She may require subsequent doses of IV iron or pRBC transfusions.  T2DM:  She was well controlled while inpatient.  Initially we started her on 10 units of lantus with a sensitive sliding scale, this was later increased to 20 units.   She will resume her home medications on discharge with close CBG monitoring.  I have warned her to cut back to 20 units of lantus if she goes low but I suspect she will be eating much more carbohydrates.  Chronic mesenteric ischemia: Home aspirin and plavix held. Atorvastatin 20 mg daily continued.  We decided to have her resume only plavix on discharge.  HTN: Continued home Benazepril 5 mg daily.    Discharge Vitals:   BP 119/61 mmHg  Pulse 80  Temp(Src) 98.1 F (36.7 C) (Oral)  Resp 16  Ht 5\' 3"  (1.6 m)  Wt 183 lb 13.8 oz (83.4 kg)  BMI 32.58 kg/m2  SpO2 94%  Discharge Labs:  Results for orders placed or performed during the hospital encounter of 02/25/15 (from the past 24 hour(s))  Glucose, capillary     Status: Abnormal   Collection Time: 02/28/15  5:00 PM  Result Value Ref Range   Glucose-Capillary 154 (H) 65 - 99 mg/dL  Glucose, capillary     Status: Abnormal    Collection Time: 02/28/15  9:04 PM  Result Value Ref Range   Glucose-Capillary 112 (H) 65 - 99 mg/dL   Comment 1 Notify RN   Glucose, capillary     Status: Abnormal   Collection Time: 03/01/15  8:22 AM  Result Value Ref Range   Glucose-Capillary 154 (H) 65 - 99 mg/dL    Signed: Lucious Groves, DO 03/01/2015, 11:55 AM    Services Ordered on Discharge: none Equipment Ordered on Discharge: none

## 2015-02-28 NOTE — Progress Notes (Signed)
Subjective: Patient is feeling well. Has not had any bowel movements since yesterday. She is eating and drinking without issue. No changes overnight. Objective: Vital signs in last 24 hours: Filed Vitals:   02/27/15 1117 02/27/15 1357 02/27/15 2157 02/28/15 0458  BP: 181/70 119/53 157/63 152/62  Pulse: 80 77 80 81  Temp:  98.6 F (37 C) 97.5 F (36.4 C) 98.5 F (36.9 C)  TempSrc:  Oral Axillary Oral  Resp:  16 16 16   Height:      Weight:      SpO2:  100% 99% 97%   Weight change:   Intake/Output Summary (Last 24 hours) at 02/28/15 1038 Last data filed at 02/28/15 0942  Gross per 24 hour  Intake 1311.67 ml  Output   2100 ml  Net -788.33 ml   General: resting in bed, no acute distress HEENT: no conjunctival pallor present, EOMI, no scleral icterus Cardiac: RRR, systolic murmur heard in upper sternal borders Pulm: clear to auscultation bilaterally, moving normal volumes of air Abd: soft, nontender, nondistended, BS present Ext: warm and well perfused, no pedal edema Neuro: alert and oriented X3  Lab Results: Basic Metabolic Panel:  Recent Labs Lab 02/25/15 1800 02/26/15 0530  NA 136 137  K 4.4 4.3  CL 107 105  CO2 22 24  GLUCOSE 320* 201*  BUN 14 15  CREATININE 0.75 0.59  CALCIUM 8.4* 8.5*   Liver Function Tests:  Recent Labs Lab 02/25/15 1249 02/25/15 1800  AST 16 17  ALT 12* 11*  ALKPHOS 64 64  BILITOT 0.4 0.4  PROT 5.5* 5.1*  ALBUMIN 3.4* 3.3*   No results for input(s): LIPASE, AMYLASE in the last 168 hours. No results for input(s): AMMONIA in the last 168 hours. CBC:  Recent Labs Lab 02/25/15 1800  02/26/15 2006 02/28/15 0543  WBC 4.3  < > 5.6 4.8  NEUTROABS 2.5  --   --   --   HGB 8.9*  < > 9.9* 9.2*  HCT 30.5*  < > 30.7* 29.6*  MCV 72.3*  < > 76.2* 76.5*  PLT 343  < > 240 296  < > = values in this interval not displayed. Cardiac Enzymes: No results for input(s): CKTOTAL, CKMB, CKMBINDEX, TROPONINI in the last 168 hours. BNP: No  results for input(s): PROBNP in the last 168 hours. D-Dimer: No results for input(s): DDIMER in the last 168 hours. CBG:  Recent Labs Lab 02/26/15 2148 02/27/15 0812 02/27/15 1156 02/27/15 1720 02/27/15 2206 02/28/15 0739  GLUCAP 230* 169* 237* 313* 210* 220*   Hemoglobin A1C: No results for input(s): HGBA1C in the last 168 hours. Fasting Lipid Panel: No results for input(s): CHOL, HDL, LDLCALC, TRIG, CHOLHDL, LDLDIRECT in the last 168 hours. Thyroid Function Tests: No results for input(s): TSH, T4TOTAL, FREET4, T3FREE, THYROIDAB in the last 168 hours. Coagulation: No results for input(s): LABPROT, INR in the last 168 hours. Anemia Panel:  Recent Labs Lab 02/25/15 1804  VITAMINB12 297  FOLATE 30.7  FERRITIN 3*  TIBC 384  IRON 13*  RETICCTPCT 2.3   Urine Drug Screen: Drugs of Abuse  No results found for: LABOPIA, COCAINSCRNUR, LABBENZ, AMPHETMU, THCU, LABBARB  Alcohol Level: No results for input(s): ETH in the last 168 hours. Urinalysis:  Recent Labs Lab 02/25/15 1505  COLORURINE YELLOW  LABSPEC 1.025  PHURINE 5.5  GLUCOSEU >1000*  HGBUR NEGATIVE  BILIRUBINUR NEGATIVE  KETONESUR NEGATIVE  PROTEINUR NEGATIVE  UROBILINOGEN 0.2  NITRITE NEGATIVE  LEUKOCYTESUR NEGATIVE  Micro Results: No results found for this or any previous visit (from the past 240 hour(s)). Studies/Results: No results found. Medications: I have reviewed the patient's current medications. Scheduled Meds: . atorvastatin  20 mg Oral Daily  . benazepril  5 mg Oral Daily  . gabapentin  300 mg Oral QID  . insulin aspart  0-5 Units Subcutaneous QHS  . insulin aspart  0-9 Units Subcutaneous TID WC  . insulin glargine  20 Units Subcutaneous QHS  . ketorolac  1 drop Left Eye QID  . ofloxacin  1 drop Left Eye QID  . pantoprazole  40 mg Oral Daily  . prednisoLONE acetate  1 drop Left Eye QID  . sodium chloride  3 mL Intravenous Q12H   Continuous Infusions: . dextrose 5 % and 0.45% NaCl  100 mL/hr (02/27/15 0304)   PRN Meds:.acetaminophen, loratadine Assessment/Plan: Principal Problem:   Iron deficiency anemia due to chronic blood loss Active Problems:   DM (diabetes mellitus) type II uncontrolled, periph vascular disorder   Essential hypertension, benign   Chronic mesenteric ischemia   Near syncope   Symptomatic anemia   GI bleed  Iron deficiency anemia due to chronic blood loss: Patient's near syncope likely 2/2 to blood loss. Patient had EGD and colonoscopy on last hospital visit (discharge 02/14/2015) which were both completely normal with exception of a single colon polyp which was removed. 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 Patient denies seeing gross blood in stool or hematemesis, but FOBT positive in the ED. Patient was cleared to continue Aspirin and plavix use. Patient's Hgb in ED was 7.2, this morning it is 6.2. Typed and screened. We are considering her blood loss due to GI bleed, possible AVM, location may be in small bowel as EGD and colonoscopy were normal. -Transfused 2 units pRBCs on 8/17, Hgb up to 9.9 with latest value at 9.2 -Patient had iron deficit of approximately 1300 before blood transfusion, given 400 mg by transfusion of 2 PRBCs, given additional 510 mg IV ferraheme on 8/18. -Hold aspirin and plavix for now, will start only Plavix on discharge -GI following for which we are appreciative. Capsule endoscopy completed. -[facility] for [facility] check next week -Will need to follow up with GI outpatient -LDH, haptoglobin to assess for hemolysis -Short stay follow ups for blood transfusions   T2DM: -Lantus 65 Units at bedtime -SSI -CBG 4x daily, before meals and at bedtime  Chronic mesenteric ischemia: Carotid stent 02/2014, SMA 06/2013, on aspirin and plavix over a year, cleared for use by GI on last hospital visit.  -Hold aspirin and plavix for now, continue only plavix on discharge -atorvastatin 20 mg  daily  HTN: -Benazepril 5 mg daily    Dispo: Disposition is deferred at this time, awaiting improvement of current medical problems.  Anticipated discharge in approximately today(s).   The patient does have a current PCP (Lorayne Marek, MD) and does not need an [facility] hospital follow-up appointment after discharge.  The patient does not have transportation limitations that hinder transportation to clinic appointments.  .Services Needed at time of discharge: Y = Yes, Blank = No PT:   OT:   RN:   Equipment:   Other:     LOS: 3 days   Zada Finders, MD 02/28/2015, 10:38 AM

## 2015-02-28 NOTE — Progress Notes (Signed)
Patient ID: Sarah Alvarez, female   DOB: [birth year], 64 y.o.   MRN: [id number] [facility]astroenterology Progress Note  Sarah Alvarez 64 y.o. 1951-02-07   Subjective: Sleeping comfortably. Easily arousable. Feels bloated. Minimal BM since admit. Objective: Vital signs in last 24 hours: Filed Vitals:   02/28/15 0458  BP: 152/62  Pulse: 81  Temp: 98.5 F (36.9 C)  Resp: 16    Physical Exam: Gen: elderly, no acute distress, well-nourished CV: RRR Chest: CTA B Abd: soft, mild distention, nontender, +BS Ext: no edema  Lab Results:  Recent Labs  02/25/15 1800 02/26/15 0530  NA 136 137  K 4.4 4.3  CL 107 105  CO2 22 24  GLUCOSE 320* 201*  BUN 14 15  CREATININE 0.75 0.59  CALCIUM 8.4* 8.5*    Recent Labs  02/25/15 1249 02/25/15 1800  AST 16 17  ALT 12* 11*  ALKPHOS 64 64  BILITOT 0.4 0.4  PROT 5.5* 5.1*  ALBUMIN 3.4* 3.3*    Recent Labs  02/25/15 1800  02/26/15 2006 02/28/15 0543  WBC 4.3  < > 5.6 4.8  NEUTROABS 2.5  --   --   --   HGB 8.9*  < > 9.9* 9.2*  HCT 30.5*  < > 30.7* 29.6*  MCV 72.3*  < > 76.2* 76.5*  PLT 343  < > 240 296  < > = values in this interval not displayed. No results for input(s): LABPROT, INR in the last 72 hours.    Assessment/Plan: Obscure GI bleeding - capsule endoscopy study inadequate due to delayed emptying of capsule from stomach (took over 5 hours) and food particles precluded view. Needs CT enterography as next step to evaluate her anemia. NPO until CT complete. Dr. Penelope Coop to f/u tomorrow.   [place] C. 02/28/2015, 12:10 PM  Pager [phone]  If no answer or after 5 PM call [phone]

## 2015-03-01 LAB — HAPTOGLOBIN: Haptoglobin: 56 mg/dL (ref 34–200)

## 2015-03-01 LAB — GLUCOSE, CAPILLARY
GLUCOSE-CAPILLARY: 154 mg/dL — AB (ref 65–99)
GLUCOSE-CAPILLARY: 337 mg/dL — AB (ref 65–99)

## 2015-03-01 NOTE — Progress Notes (Signed)
[facility] Progress Note  Subjective: No complaints  CT enterography reviewed and was negative for any cause of anemia  Objective: Vital signs in last 24 hours: Temp:  [98.1 F (36.7 C)-98.6 F (37 C)] 98.1 F (36.7 C) (08/20 0539) Pulse Rate:  [77-90] 80 (08/20 0539) Resp:  [16-18] 16 (08/20 0539) BP: (119-166)/(61-68) 119/61 mmHg (08/20 0539) SpO2:  [94 %-98 %] 94 % (08/20 0539) Weight change:    PE:  No distress  Heart rate and rhythm  Lungs clear  Abdomen soft and nontender  Lab Results: Results for orders placed or performed during the hospital encounter of 02/25/15 (from the past 24 hour(s))  Haptoglobin     Status: None   Collection Time: 02/28/15 10:55 AM  Result Value Ref Range   Haptoglobin 56 34 - 200 mg/dL  Lactate dehydrogenase     Status: None   Collection Time: 02/28/15 10:55 AM  Result Value Ref Range   LDH 185 98 - 192 U/L  Glucose, capillary     Status: Abnormal   Collection Time: 02/28/15 11:29 AM  Result Value Ref Range   Glucose-Capillary 277 (H) 65 - 99 mg/dL  Glucose, capillary     Status: Abnormal   Collection Time: 02/28/15  5:00 PM  Result Value Ref Range   Glucose-Capillary 154 (H) 65 - 99 mg/dL  Glucose, capillary     Status: Abnormal   Collection Time: 02/28/15  9:04 PM  Result Value Ref Range   Glucose-Capillary 112 (H) 65 - 99 mg/dL   Comment 1 Notify RN   Glucose, capillary     Status: Abnormal   Collection Time: 03/01/15  8:22 AM  Result Value Ref Range   Glucose-Capillary 154 (H) 65 - 99 mg/dL    Studies/Results: Ct Entero Abd/pelvis W/cm  03/01/2015   CLINICAL DATA:  Iron deficiency anemia. Abdominal swelling and nausea for several weeks. GI bleeding. Dizziness.  EXAM: CT ABDOMEN AND PELVIS WITH CONTRAST (ENTEROGRAPHY)  TECHNIQUE: Multidetector CT of the abdomen and pelvis during bolus administration of intravenous contrast. Negative oral contrast VoLumen was given.  CONTRAST:  18mL OMNIPAQUE IOHEXOL 300  MG/ML  SOLN  COMPARISON:  07/23/2014  FINDINGS: Lower Chest: No acute findings.  Hepatobiliary: No masses or other significant abnormality identified. Prior cholecystectomy noted. No evidence of biliary dilatation.  Pancreas: No mass, inflammatory changes, or other significant abnormality identified.  Spleen:  Within normal limits in size and appearance.  Adrenals:  No masses identified.  Kidneys/Urinary Tract:  No evidence of masses or hydronephrosis.  Stomach/Bowel/Peritoneum: No evidence of small bowel wall thickening or dilatation. No evidence of abnormal mucosal enhancement or mesenteric inflammatory changes. No evidence of fistulization or extraluminal gas or fluid collections. The terminal ileum is normal in appearance. No other areas of bowel wall thickening identified. Normal appendix visualized.  Vascular/Lymphatic: No pathologically enlarged lymph nodes identified. No abdominal aortic aneurysm or other significant retroperitoneal abnormality demonstrated.  Reproductive: Prior hysterectomy noted. Adnexal regions are unremarkable in appearance.  Other:  None.  Musculoskeletal:  No suspicious bone lesions identified.  IMPRESSION: Negative. No evidence of inflammatory bowel disease or other significant abnormality.   Electronically Signed   By: Earle Gell M.D.   On: 03/01/2015 07:27      Assessment: Anemia. No obvious cause found on endoscopic evaluation and CT enterography.  Plan:   Would send home on iron supplement. After follow-up with PCP in a week or 2 and have repeat CBC. We will sign off. Call us if  needed.    Cassell Clement 03/01/2015, 10:26 AM  Pager: [phone] If no answer or after 5 PM call [phone] Lab Results  Component Value Date   HGB 9.2* 02/28/2015   HGB 9.9* 02/26/2015   HGB 6.3* 02/26/2015   HCT 29.6* 02/28/2015   HCT 30.7* 02/26/2015   HCT 20.7* 02/26/2015   ALKPHOS 64 02/25/2015   ALKPHOS 64 02/25/2015   ALKPHOS 66 02/12/2015   AST 17 02/25/2015   AST  16 02/25/2015   AST 15 02/12/2015   ALT 11* 02/25/2015   ALT 12* 02/25/2015   ALT 10* 02/12/2015

## 2015-03-01 NOTE — Progress Notes (Signed)
Subjective: No further BM or other signs of bleeding.  Patient feels well, reports she is ready to go home.  Has follow up appointment on Monday. Objective: Vital signs in last 24 hours: Filed Vitals:   02/28/15 0458 02/28/15 1356 02/28/15 2107 03/01/15 0539  BP: 152/62 164/62 166/68 119/61  Pulse: 81 77 90 80  Temp: 98.5 F (36.9 C) 98.5 F (36.9 C) 98.6 F (37 C) 98.1 F (36.7 C)  TempSrc: Oral Oral Oral Oral  Resp: 16 18 18 16   Height:      Weight:      SpO2: 97% 98% 97% 94%   Weight change:   Intake/Output Summary (Last 24 hours) at 03/01/15 0957 Last data filed at 02/28/15 1800  Gross per 24 hour  Intake      0 ml  Output    900 ml  Net   -900 ml   General: resting in bed, no acute distress Cardiac: RRR Pulm: clear to auscultation bilaterally Abd: soft, nontender, nondistended, BS present Ext: warm and well perfused, no pedal edema Neuro: alert and oriented X3  Lab Results: Basic Metabolic Panel:  Recent Labs Lab 02/25/15 1800 02/26/15 0530  NA 136 137  K 4.4 4.3  CL 107 105  CO2 22 24  GLUCOSE 320* 201*  BUN 14 15  CREATININE 0.75 0.59  CALCIUM 8.4* 8.5*   Liver Function Tests:  Recent Labs Lab 02/25/15 1249 02/25/15 1800  AST 16 17  ALT 12* 11*  ALKPHOS 64 64  BILITOT 0.4 0.4  PROT 5.5* 5.1*  ALBUMIN 3.4* 3.3*   No results for input(s): LIPASE, AMYLASE in the last 168 hours. No results for input(s): AMMONIA in the last 168 hours. CBC:  Recent Labs Lab 02/25/15 1800  02/26/15 2006 02/28/15 0543  WBC 4.3  < > 5.6 4.8  NEUTROABS 2.5  --   --   --   HGB 8.9*  < > 9.9* 9.2*  HCT 30.5*  < > 30.7* 29.6*  MCV 72.3*  < > 76.2* 76.5*  PLT 343  < > 240 296  < > = values in this interval not displayed. Cardiac Enzymes: No results for input(s): CKTOTAL, CKMB, CKMBINDEX, TROPONINI in the last 168 hours. BNP: No results for input(s): PROBNP in the last 168 hours. D-Dimer: No results for input(s): DDIMER in the last 168  hours. CBG:  Recent Labs Lab 02/27/15 2206 02/28/15 0739 02/28/15 1129 02/28/15 1700 02/28/15 2104 03/01/15 0822  GLUCAP 210* 220* 277* 154* 112* 154*   Hemoglobin A1C: No results for input(s): HGBA1C in the last 168 hours. Fasting Lipid Panel: No results for input(s): CHOL, HDL, LDLCALC, TRIG, CHOLHDL, LDLDIRECT in the last 168 hours. Thyroid Function Tests: No results for input(s): TSH, T4TOTAL, FREET4, T3FREE, THYROIDAB in the last 168 hours. Coagulation: No results for input(s): LABPROT, INR in the last 168 hours. Anemia Panel:  Recent Labs Lab 02/25/15 1804  VITAMINB12 297  FOLATE 30.7  FERRITIN 3*  TIBC 384  IRON 13*  RETICCTPCT 2.3   Urine Drug Screen: Drugs of Abuse  No results found for: LABOPIA, COCAINSCRNUR, LABBENZ, AMPHETMU, THCU, LABBARB  Alcohol Level: No results for input(s): ETH in the last 168 hours. Urinalysis:  Recent Labs Lab 02/25/15 1505  COLORURINE YELLOW  LABSPEC 1.025  PHURINE 5.5  GLUCOSEU >1000*  HGBUR NEGATIVE  BILIRUBINUR NEGATIVE  KETONESUR NEGATIVE  PROTEINUR NEGATIVE  UROBILINOGEN 0.2  NITRITE NEGATIVE  LEUKOCYTESUR NEGATIVE    Micro Results: No results found for  this or any previous visit (from the past 240 hour(s)). Studies/Results: Ct Entero Abd/pelvis W/cm  03/01/2015   CLINICAL DATA:  Iron deficiency anemia. Abdominal swelling and nausea for several weeks. GI bleeding. Dizziness.  EXAM: CT ABDOMEN AND PELVIS WITH CONTRAST (ENTEROGRAPHY)  TECHNIQUE: Multidetector CT of the abdomen and pelvis during bolus administration of intravenous contrast. Negative oral contrast VoLumen was given.  CONTRAST:  161mL OMNIPAQUE IOHEXOL 300 MG/ML  SOLN  COMPARISON:  07/23/2014  FINDINGS: Lower Chest: No acute findings.  Hepatobiliary: No masses or other significant abnormality identified. Prior cholecystectomy noted. No evidence of biliary dilatation.  Pancreas: No mass, inflammatory changes, or other significant abnormality identified.   Spleen:  Within normal limits in size and appearance.  Adrenals:  No masses identified.  Kidneys/Urinary Tract:  No evidence of masses or hydronephrosis.  Stomach/Bowel/Peritoneum: No evidence of small bowel wall thickening or dilatation. No evidence of abnormal mucosal enhancement or mesenteric inflammatory changes. No evidence of fistulization or extraluminal gas or fluid collections. The terminal ileum is normal in appearance. No other areas of bowel wall thickening identified. Normal appendix visualized.  Vascular/Lymphatic: No pathologically enlarged lymph nodes identified. No abdominal aortic aneurysm or other significant retroperitoneal abnormality demonstrated.  Reproductive: Prior hysterectomy noted. Adnexal regions are unremarkable in appearance.  Other:  None.  Musculoskeletal:  No suspicious bone lesions identified.  IMPRESSION: Negative. No evidence of inflammatory bowel disease or other significant abnormality.   Electronically Signed   By: Earle Gell M.D.   On: 03/01/2015 07:27   Medications: I have reviewed the patient's current medications. Scheduled Meds: . atorvastatin  20 mg Oral Daily  . benazepril  5 mg Oral Daily  . gabapentin  300 mg Oral QID  . insulin aspart  0-5 Units Subcutaneous QHS  . insulin aspart  0-9 Units Subcutaneous TID WC  . insulin glargine  20 Units Subcutaneous QHS  . ketorolac  1 drop Left Eye QID  . ofloxacin  1 drop Left Eye QID  . pantoprazole  40 mg Oral Daily  . prednisoLONE acetate  1 drop Left Eye QID  . sodium chloride  3 mL Intravenous Q12H   Continuous Infusions: . dextrose 5 % and 0.45% NaCl 100 mL/hr (02/27/15 0304)   PRN Meds:.acetaminophen, loratadine Assessment/Plan: Principal Problem:   Iron deficiency anemia due to chronic blood loss Active Problems:   DM (diabetes mellitus) type II uncontrolled, periph vascular disorder   Essential hypertension, benign   Chronic mesenteric ischemia   Near syncope   Symptomatic anemia   GI  bleed   Iron (Fe) deficiency anemia  Iron deficiency anemia due to chronic blood loss: EGD and colonoscopy negative, capsule endoscopy was delayed leaving stomach and not of assistance, CT enterograpy shows no concerned lesions.  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 -Hold aspirin and plavix for now, will start only Plavix on discharge -GI following for which we are appreciative. Capsule endoscopy completed but non diagnostic.  CT enteropraphy -Will need [facility] at [facility] for CBC check on Monday, will likely need close following of CBCs -Outpatient follow up with GI outpatient  T2DM: well controlled -Lantus 20 Units at bedtime -SSI -S -CBG 4x daily, before meals and at bedtime -She will resume her home medications at discharge, I have cautioned her about the 65 units of insulin as she has only required 20 units here.  Chronic mesenteric ischemia: Carotid stent 02/2014, SMA 06/2013, on aspirin and plavix over  a year, cleared for use by GI on last hospital visit.  -Hold aspirin and plavix for now, continue only plavix on discharge -atorvastatin 20 mg daily  HTN: -Benazepril 5 mg daily    Dispo: Discharge home today, she has close follow up with her PCP scheduled for Monday.  The patient does have a current PCP (Lorayne Marek, MD) and does not need an [facility] hospital follow-up appointment after discharge.  The patient does not have transportation limitations that hinder transportation to clinic appointments.  .Services Needed at time of discharge: Y = Yes, Blank = No PT:   OT:   RN:   Equipment:   Other:     LOS: 4 days   Lucious Groves, DO 03/01/2015, 9:57 AM

## 2015-03-01 NOTE — Discharge Instructions (Signed)
Please follow up with your primary care doctor to recheck your blood levels.   We want you to start taking the oral iron pill and STOP taking Aspirin.  You may continue to take Plavix.

## 2015-03-01 NOTE — Progress Notes (Signed)
Discharge Note: All d/c instructions given with handout. No concerns. Waiting for ride.  Gershon Crane

## 2015-03-03 ENCOUNTER — Encounter (HOSPITAL_COMMUNITY): Payer: Self-pay | Admitting: Emergency Medicine

## 2015-03-03 ENCOUNTER — Ambulatory Visit: Payer: Medicaid Other | Attending: Family Medicine | Admitting: Family Medicine

## 2015-03-03 ENCOUNTER — Encounter: Payer: Self-pay | Admitting: Family Medicine

## 2015-03-03 ENCOUNTER — Emergency Department (HOSPITAL_COMMUNITY)
Admission: EM | Admit: 2015-03-03 | Discharge: 2015-03-03 | Disposition: A | Discharge status: Home / Self Care | Payer: Medicaid Other | Attending: Emergency Medicine | Admitting: Emergency Medicine

## 2015-03-03 ENCOUNTER — Emergency Department (HOSPITAL_COMMUNITY): Payer: Medicaid Other

## 2015-03-03 VITALS — BP 121/74 | HR 96 | Temp 98.9°F | Resp 16 | Ht 63.0 in | Wt 184.0 lb

## 2015-03-03 DIAGNOSIS — E139 Other specified diabetes mellitus without complications: Secondary | ICD-10-CM | POA: Diagnosis not present

## 2015-03-03 DIAGNOSIS — D509 Iron deficiency anemia, unspecified: Secondary | ICD-10-CM | POA: Diagnosis not present

## 2015-03-03 DIAGNOSIS — D62 Acute posthemorrhagic anemia: Secondary | ICD-10-CM

## 2015-03-03 DIAGNOSIS — K922 Gastrointestinal hemorrhage, unspecified: Secondary | ICD-10-CM | POA: Diagnosis not present

## 2015-03-03 DIAGNOSIS — N390 Urinary tract infection, site not specified: Secondary | ICD-10-CM | POA: Insufficient documentation

## 2015-03-03 DIAGNOSIS — E1140 Type 2 diabetes mellitus with diabetic neuropathy, unspecified: Secondary | ICD-10-CM | POA: Insufficient documentation

## 2015-03-03 DIAGNOSIS — R55 Syncope and collapse: Secondary | ICD-10-CM | POA: Diagnosis not present

## 2015-03-03 DIAGNOSIS — Z87891 Personal history of nicotine dependence: Secondary | ICD-10-CM | POA: Insufficient documentation

## 2015-03-03 DIAGNOSIS — D500 Iron deficiency anemia secondary to blood loss (chronic): Secondary | ICD-10-CM

## 2015-03-03 DIAGNOSIS — I959 Hypotension, unspecified: Secondary | ICD-10-CM | POA: Diagnosis not present

## 2015-03-03 DIAGNOSIS — M1990 Unspecified osteoarthritis, unspecified site: Secondary | ICD-10-CM | POA: Insufficient documentation

## 2015-03-03 DIAGNOSIS — E1151 Type 2 diabetes mellitus with diabetic peripheral angiopathy without gangrene: Secondary | ICD-10-CM

## 2015-03-03 DIAGNOSIS — D591 Other autoimmune hemolytic anemias: Secondary | ICD-10-CM | POA: Diagnosis not present

## 2015-03-03 DIAGNOSIS — J45909 Unspecified asthma, uncomplicated: Secondary | ICD-10-CM | POA: Insufficient documentation

## 2015-03-03 DIAGNOSIS — I2510 Atherosclerotic heart disease of native coronary artery without angina pectoris: Secondary | ICD-10-CM | POA: Insufficient documentation

## 2015-03-03 DIAGNOSIS — Z79899 Other long term (current) drug therapy: Secondary | ICD-10-CM | POA: Diagnosis not present

## 2015-03-03 DIAGNOSIS — I10 Essential (primary) hypertension: Secondary | ICD-10-CM | POA: Diagnosis not present

## 2015-03-03 DIAGNOSIS — G8929 Other chronic pain: Secondary | ICD-10-CM | POA: Diagnosis not present

## 2015-03-03 DIAGNOSIS — E1165 Type 2 diabetes mellitus with hyperglycemia: Principal | ICD-10-CM

## 2015-03-03 DIAGNOSIS — E1159 Type 2 diabetes mellitus with other circulatory complications: Secondary | ICD-10-CM

## 2015-03-03 DIAGNOSIS — D649 Anemia, unspecified: Secondary | ICD-10-CM

## 2015-03-03 DIAGNOSIS — R011 Cardiac murmur, unspecified: Secondary | ICD-10-CM | POA: Insufficient documentation

## 2015-03-03 DIAGNOSIS — Z7902 Long term (current) use of antithrombotics/antiplatelets: Secondary | ICD-10-CM | POA: Insufficient documentation

## 2015-03-03 DIAGNOSIS — R531 Weakness: Secondary | ICD-10-CM | POA: Diagnosis present

## 2015-03-03 DIAGNOSIS — IMO0002 Reserved for concepts with insufficient information to code with codable children: Secondary | ICD-10-CM

## 2015-03-03 LAB — URINALYSIS, ROUTINE W REFLEX MICROSCOPIC
BILIRUBIN URINE: NEGATIVE
Glucose, UA: >1000 mg/dL — AB
KETONES UR: NEGATIVE mg/dL
Nitrite: NEGATIVE
PROTEIN: NEGATIVE mg/dL
Specific Gravity, Urine: 1.018 (ref 1.005–1.030)
UROBILINOGEN UA: 0.2 mg/dL (ref 0.0–1.0)
pH: 5 (ref 5.0–8.0)

## 2015-03-03 LAB — URINE MICROSCOPIC-ADD ON

## 2015-03-03 LAB — CBC WITH DIFFERENTIAL/PLATELET
BASOS PCT: 1 % (ref 0–1)
Basophils Absolute: 0 10*3/uL (ref 0.0–0.1)
EOS ABS: 0.1 10*3/uL (ref 0.0–0.7)
EOS PCT: 2 % (ref 0–5)
HCT: 33.1 % — ABNORMAL LOW (ref 36.0–46.0)
HEMOGLOBIN: 10.3 g/dL — AB (ref 12.0–15.0)
Lymphocytes Relative: 16 % (ref 12–46)
Lymphs Abs: 1.1 10*3/uL (ref 0.7–4.0)
MCH: 24.7 pg — ABNORMAL LOW (ref 26.0–34.0)
MCHC: 31.1 g/dL (ref 30.0–36.0)
MCV: 79.4 fL (ref 78.0–100.0)
MONO ABS: 0.3 10*3/uL (ref 0.1–1.0)
MONOS PCT: 5 % (ref 3–12)
NEUTROS PCT: 76 % (ref 43–77)
Neutro Abs: 4.9 10*3/uL (ref 1.7–7.7)
PLATELETS: 305 10*3/uL (ref 150–400)
RBC: 4.17 MIL/uL (ref 3.87–5.11)
RDW: 19.7 % — AB (ref 11.5–15.5)
WBC: 6.5 10*3/uL (ref 4.0–10.5)

## 2015-03-03 LAB — POCT URINALYSIS DIPSTICK
Bilirubin, UA: NEGATIVE
Glucose, UA: 500
KETONES UA: NEGATIVE
Nitrite, UA: NEGATIVE
PH UA: 5.5
PROTEIN UA: NEGATIVE
SPEC GRAV UA: 1.015
Urobilinogen, UA: 0.2

## 2015-03-03 LAB — CBC
HCT: 33.1 % — ABNORMAL LOW (ref 36.0–46.0)
HEMOGLOBIN: 10.3 g/dL — AB (ref 12.0–15.0)
MCH: 24.8 pg — AB (ref 26.0–34.0)
MCHC: 31.1 g/dL (ref 30.0–36.0)
MCV: 79.8 fL (ref 78.0–100.0)
MPV: 9.5 fL (ref 8.6–12.4)
PLATELETS: 316 10*3/uL (ref 150–400)
RBC: 4.15 MIL/uL (ref 3.87–5.11)
RDW: 19.7 % — ABNORMAL HIGH (ref 11.5–15.5)
WBC: 6 10*3/uL (ref 4.0–10.5)

## 2015-03-03 LAB — COMPREHENSIVE METABOLIC PANEL
ALK PHOS: 74 U/L (ref 38–126)
ALT: 13 U/L — AB (ref 14–54)
AST: 25 U/L (ref 15–41)
Albumin: 3.8 g/dL (ref 3.5–5.0)
Anion gap: 9 (ref 5–15)
BUN: 13 mg/dL (ref 6–20)
CALCIUM: 9.4 mg/dL (ref 8.9–10.3)
CO2: 23 mmol/L (ref 22–32)
CREATININE: 0.84 mg/dL (ref 0.44–1.00)
Chloride: 109 mmol/L (ref 101–111)
GFR calc non Af Amer: >60 mL/min (ref >60)
Glucose, Bld: 95 mg/dL (ref 65–99)
Potassium: 4.2 mmol/L (ref 3.5–5.1)
SODIUM: 141 mmol/L (ref 135–145)
Total Bilirubin: 0.3 mg/dL (ref 0.3–1.2)
Total Protein: 6.1 g/dL — ABNORMAL LOW (ref 6.5–8.1)

## 2015-03-03 LAB — TYPE AND SCREEN
ABO/RH(D): O POS
Antibody Screen: NEGATIVE

## 2015-03-03 LAB — GLUCOSE, POCT (MANUAL RESULT ENTRY)
POC GLUCOSE: 295 mg/dL — AB (ref 70–99)
POC Glucose: 398 mg/dl — AB (ref 70–99)
POC Glucose: 114 mg/dl — AB (ref 70–99)

## 2015-03-03 LAB — I-STAT CG4 LACTIC ACID, ED
LACTIC ACID, VENOUS: 1.45 mmol/L (ref 0.5–2.0)
Lactic Acid, Venous: 2.35 mmol/L (ref 0.5–2.0)

## 2015-03-03 LAB — POC OCCULT BLOOD, ED: FECAL OCCULT BLD: POSITIVE — AB

## 2015-03-03 LAB — I-STAT TROPONIN, ED: Troponin i, poc: 0 ng/mL (ref 0.00–0.08)

## 2015-03-03 LAB — LIPASE, BLOOD: Lipase: 27 U/L (ref 22–51)

## 2015-03-03 MED ORDER — SODIUM CHLORIDE 0.9 % IV BOLUS (SEPSIS)
500.0000 mL | INTRAVENOUS | Status: AC
  Administered 2015-03-03: 500 mL via INTRAVENOUS

## 2015-03-03 MED ORDER — SODIUM CHLORIDE 0.9 % IV SOLN: 1000.0000 mL | INTRAVENOUS | Status: DC

## 2015-03-03 MED ORDER — SODIUM CHLORIDE 0.9 % IV BOLUS (SEPSIS)
1000.0000 mL | INTRAVENOUS | Status: AC
  Administered 2015-03-03 (×2): 1000 mL via INTRAVENOUS

## 2015-03-03 MED ORDER — GLUCOSE BLOOD VI STRP: 1.0000 | ORAL_STRIP | Freq: Three times a day (TID) | Status: AC

## 2015-03-03 MED ORDER — SITAGLIPTIN PHOS-METFORMIN HCL 50-1000 MG PO TABS: 1.0000 | ORAL_TABLET | Freq: Two times a day (BID) | ORAL | Status: DC

## 2015-03-03 MED ORDER — CEPHALEXIN 500 MG PO CAPS: 500.0000 mg | ORAL_CAPSULE | Freq: Two times a day (BID) | ORAL | Status: DC

## 2015-03-03 MED ORDER — INSULIN ASPART 100 UNIT/ML ~~LOC~~ SOLN
20.0000 [IU] | Freq: Once | SUBCUTANEOUS | Status: AC
  Administered 2015-03-03: 20 [IU] via SUBCUTANEOUS

## 2015-03-03 MED ORDER — GLIPIZIDE 10 MG PO TABS: ORAL_TABLET | ORAL | Status: DC

## 2015-03-03 MED ORDER — INSULIN GLARGINE 100 UNIT/ML SOLOSTAR PEN: 65.0000 [IU] | PEN_INJECTOR | Freq: Every day | SUBCUTANEOUS | Status: DC

## 2015-03-03 NOTE — Progress Notes (Signed)
   Subjective:    Patient ID: Sarah Alvarez, female    DOB: 08-19-1950, 64 y.o.   MRN: [id number] CC: HFU GI bleed, anemia, diabetes  HPI  CHRONIC DIABETES  Disease Monitoring  Blood Sugar Ranges: 200-300  Polyuria: no   Visual problems: no   Medication Compliance: yes but since CBGs running higher she takes extra lantus at times.  Medication Side Effects  Hypoglycemia: no   Preventitive Health Care  Eye Exam: done has cataracts, needs surgery   Foot Exam: done today   Diet pattern: regular meals   Exercise: rare  2. Lower GI bleed: hospitalized from 8/16-8/20/2016. Hgb down to 6.6, transfused up to 9.9. 9.2 on D/C from hospital. Had BM yesterday, dark stool, but no gross blood. Taking oral iron. Source of GI bleed has not been identified. She denies abdominal pain and fatigue.   Social History  Substance Use Topics  . Smoking status: Former Smoker -- 1.00 packs/day for 20 years    Types: Cigarettes    Quit date: 06/21/2013  . Smokeless tobacco: Never Used  . Alcohol Use: No   Review of Systems  Constitutional: Negative for fever and chills.  Eyes: Negative for visual disturbance.  Respiratory: Negative for shortness of breath.   Cardiovascular: Negative for chest pain.  Gastrointestinal: Negative for abdominal pain and blood in stool.  Musculoskeletal: Negative for back pain and arthralgias.  Skin: Negative for rash.  Allergic/Immunologic: Negative for immunocompromised state.  Hematological: Negative for adenopathy. Does not bruise/bleed easily.  Psychiatric/Behavioral: Negative for suicidal ideas and dysphoric mood.      Objective:   Physical Exam  Constitutional: She is oriented to person, place, and time. She appears well-developed and well-nourished. No distress.  HENT:  Mouth/Throat:    Eyes: Conjunctivae are normal. Pupils are equal, round, and reactive to light.  Cardiovascular: Normal rate, regular rhythm, normal heart sounds and intact distal pulses.     Pulmonary/Chest: Effort normal and breath sounds normal.  Musculoskeletal: She exhibits no edema.  Neurological: She is alert and oriented to person, place, and time.  Skin: Skin is warm and dry. No rash noted.  Psychiatric: She has a normal mood and affect.  BP 121/74 mmHg  Pulse 96  Temp(Src) 98.9 F (37.2 C) (Oral)  Resp 16  Ht 5\' 3"  (1.6 m)  Wt 184 lb (83.462 kg)  BMI 32.60 kg/m2  SpO2 100%  CBG 398  UA: negative ketone, 500 glucose  Lab Results  Component Value Date   HGBA1C 8.50 12/16/2014  treated with 20 U novolog F/u CBG 295   Patient was brought back to clinical area, 3 hrs after discharge due to feeling faint and weak.  CBG 140 BP 85/56   Patient alert, complaining of R arm heaviness.   Patient given soda and pretzels Still alert and less dizzy But still with R arm heaviness and BP low   Patient transferred to ED for evaluation and treatment      Assessment & Plan:

## 2015-03-03 NOTE — Assessment & Plan Note (Signed)
A: diabetes with known vascular complications including mesenteric ischemia and carotid artery disease. Recent hyperglycemia possibly stress related in setting of GI bleed P: Add janumet Continue lantus at current dose Refilled test strips

## 2015-03-03 NOTE — Patient Instructions (Addendum)
Ms. Spizzirri  Thank you for coming in today. It was a pleasure meeting you. I look forward to being your primary doctor.  1. Diabetes: Stop metformin Start januvia twice daily Continue lantus 65 U at bedtime Plenty of water, Do not drink sugar sweetened juice, soda etc   2. GI bleed: CBC today  F/u in 3 weeks with RN for CBG check and A1c  F/u with me in 6 weeks for diabetes, we may change your insulin  Dr. Adrian Blackwater

## 2015-03-03 NOTE — ED Notes (Signed)
Phlebotomy at bedside with the patient.   

## 2015-03-03 NOTE — ED Provider Notes (Signed)
4:10 PM Patient signed out to me at shift change by [facility], PA-C.  Patient presents today with hypotension.  She has recently been admitted twice for orthostatic hypotension.  She was discharged 2 days ago.  Patient given IVF in the ED and vital signs improved.  She is hemoccult positive, but this was worked up during recent admission.  Hemoglobin today is stable.  According to previous provider no gross bleeding on exam.  UA pending.  Plan is for the patient to be discharged if UA is negative.  UA showing possible UTI.  Urine cultured.  Patient started on antibiotics.   She reports that she is feeing much better.  VSS.  Patient stable for discharge.  Return precautions given.  Hyman Bible, PA-C 03/05/15 [place], PA-C 03/05/15 1352  Fredia Sorrow, MD 03/07/15 [phone]

## 2015-03-03 NOTE — Progress Notes (Signed)
HFU- GI Bleeding, Stated no pain feeling "fine" today. Elevated glucose today. Stated took medication about 7:30 am with grapefruit juice, toast and milk for breakfast  No Hx tobacco.

## 2015-03-03 NOTE — ED Notes (Addendum)
EMS - Patient coming from [facility] with c/o hypotension, weakness and dizziness.  Initial BP 86/58, CBG 398 was given 20 units Novolog at [facility] and CBG with EMS 88.

## 2015-03-03 NOTE — Assessment & Plan Note (Signed)
A: GI bleed with source unidentified. On iron therapy. Recent blood transfusion  P: CBC today  Patient transferred to ED today  for weakness and low BP

## 2015-03-03 NOTE — ED Provider Notes (Signed)
CSN: [id number]     Arrival date & time 03/03/15  1346 History   First MD Initiated Contact with Patient 03/03/15 1413     Chief Complaint  Patient presents with  . Hypotension  . Weakness  . Dizziness     (Consider location/radiation/quality/duration/timing/severity/associated sxs/prior Treatment) HPI Comments: Sarah Alvarez is a 64 y.o. 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 She reports that when she went to her doctor's office this morning she felt fine, she was found to have hyperglycemia in the office and was given 20 units of NovoLog, at around 1145 she began to "feel funny" with lightheadedness that occurred when she stood up, unrelieved with glucose tablets, and somewhat relieved with sitting down. Her repeat CBG was 114 in the office prior to being transferred here for her orthostasis. Lantus 65U taken last night around 9am. Currently she feels fine aside from feeling generally weak and fatigued. She reports last night she had dark stool but denies that this was sticky or tarry. She has been compliant since her discharge 2 days ago with her iron supplementation. She denies any diaphoresis, fevers, chills, chest pain, shortness breath, abdominal pain, nausea, vomiting, diarrhea, constipation, hematochezia, rectal pain, dysuria, hematuria, numbness, tingling, focal weakness, leg swelling, claudication, headache, vision changes, vertigo, cough, or URI symptoms.  Patient  is a 64 y.o. female presenting with near-syncope. The history is provided by the patient and medical records. No language interpreter was used.  Near Syncope This is a recurrent problem. The current episode started today. The problem occurs intermittently. The problem has been unchanged. Associated symptoms include weakness (generalized). Pertinent negatives include no abdominal pain, arthralgias, chest pain, chills, coughing, diaphoresis, fever, headaches, myalgias, nausea, numbness, sore throat, vertigo, visual change or vomiting. The symptoms are aggravated by standing. She has tried lying down for the symptoms. The treatment provided mild relief.    Past Medical History  Diagnosis Date  . Staph infection   . Restless leg syndrome   . Chronic mesenteric ischemia     s/p SMA stent 06/22/13  . Coronary artery disease   . Hypertension   . Hyperlipidemia   . Heart murmur   . Asthma     "mild touch" (02/11/2015)  . Anemia   . GERD (gastroesophageal reflux disease)   . Chronic lower back pain   . Type II diabetes mellitus   . Diabetic peripheral neuropathy     "in my legs" (02/11/2015)  . Daily headache   . Arthritis     "hands" (02/25/2015)   Past Surgical History  Procedure Laterality Date  . Endarterectomy Right 07/19/2013    Procedure: ENDARTERECTOMY CAROTID-RIGHT;  Surgeon: Angelia Mould, MD;  Location: [facility];  Service: Vascular;  Laterality: Right;  . Insertion of retrograde carotid stent Left 03/05/2014    Procedure: 1)Cutdown of open exposure left common carotid; 2) Retrograde left common carotid stent; 3) Repair of left common carotid artery.;  Surgeon: Elam Dutch, MD;  Location: [facility];  Service: Vascular;  Laterality: Left;  .  Arch aortogram N/A 02/11/2014    Procedure: ARCH AORTOGRAM;  Surgeon: Angelia Mould, MD;  Location: [facility] CATH LAB;  Service: Cardiovascular;  Laterality: N/A;  . Left heart catheterization with coronary angiogram N/A 10/03/2014    Procedure:  LEFT HEART CATHETERIZATION WITH CORONARY ANGIOGRAM;  Surgeon: Burnell Blanks, MD;  Location: [facility] CATH LAB;  Service: Cardiovascular;  Laterality: N/A;  . Cataract extraction w/ intraocular lens implant Left 02/03/2015  . Tonsillectomy  1950's  . Finger fracture surgery Left 2015    "they had to rebreak it and put pins in it"  . Fracture surgery    . Abdominal hysterectomy    . Coronary angioplasty      ? LCX stent ~ 2000 in [place], [place] with LCX stent [facility] 09/1999 for reported re-instent stenosis  . Esophagogastroduodenoscopy (egd) with propofol Left 02/13/2015    Procedure: ESOPHAGOGASTRODUODENOSCOPY (EGD) WITH PROPOFOL;  Surgeon: Ronald Lobo, MD;  Location: [facility];  Service: Endoscopy;  Laterality: Left;  . Colonoscopy with propofol N/A 02/14/2015    Procedure: COLONOSCOPY WITH PROPOFOL;  Surgeon: Ronald Lobo, MD;  Location: [facility] ENDOSCOPY;  Service: Endoscopy;  Laterality: N/A;  . Laparoscopic cholecystectomy    . Givens capsule study N/A 02/27/2015    Procedure: GIVENS CAPSULE STUDY;  Surgeon: Wilford Corner, MD;  Location: [facility] ENDOSCOPY;  Service: Endoscopy;  Laterality: N/A;   Family History  Problem Relation Age of Onset  . Diabetes Mother   . Stroke Mother   . Hyperlipidemia Mother   . Cancer Father   . Heart disease Father     PVD and  CAROTID  . Heart attack Father   . Diabetes Sister   . Hypertension Sister   . Heart disease Sister     Before age 39  . Hyperlipidemia Sister   . Varicose Veins Sister   . Cancer Brother   . Cancer Maternal Aunt     breast cancer   . Diabetes Maternal Grandmother   . Diabetes Paternal Grandmother    Social History  Substance Use Topics  . Smoking status: Former Smoker -- 1.00 packs/day for 20 years    Types: Cigarettes    Quit date: 06/21/2013  . Smokeless tobacco: Never Used  . Alcohol Use: No   OB History    No data available     Review of Systems  Constitutional: Negative for fever, chills and diaphoresis.   HENT: Negative for ear discharge, ear pain, rhinorrhea and sore throat.   Eyes: Negative for visual disturbance.  Respiratory: Negative for cough and shortness of breath.   Cardiovascular: Positive for near-syncope. Negative for chest pain and leg swelling.  Gastrointestinal: Positive for blood in stool (darker stools). Negative for nausea, vomiting, abdominal pain, diarrhea, constipation, anal bleeding and rectal pain.  Genitourinary: Negative for dysuria and hematuria.  Musculoskeletal: Negative for myalgias and arthralgias.  Skin: Negative for color change.  Allergic/Immunologic: Positive for immunocompromised state (diabetic).  Neurological: Positive for dizziness, weakness (generalized) and light-headedness. Negative for vertigo, syncope, numbness and headaches.  Hematological: Bruises/bleeds easily (on plavix).  Psychiatric/Behavioral: Negative for confusion.   10 Systems reviewed and are negative for acute change except as noted in the HPI.    Allergies  Review of patient's allergies indicates no known allergies.  Home Medications   Prior to Admission medications   Medication Sig Start Date End Date Taking? Authorizing Provider  acetaminophen (TYLENOL) 650 MG CR tablet Take 650 mg by mouth every 8 (eight) hours as needed for pain.  Historical Provider, MD  atorvastatin (LIPITOR) 20 MG tablet Take 1 tablet (20 mg total) by mouth daily. 08/13/14   Lorayne Marek, MD  benazepril (LOTENSIN) 5 MG tablet TAKE 2 TABLETS BY MOUTH EVERY MORNING. 12/16/14   Lorayne Marek, MD  clopidogrel (PLAVIX) 75 MG tablet Take 1 tablet (75 mg total) by mouth daily with breakfast. 12/16/14   Lorayne Marek, MD  cyclobenzaprine (FLEXERIL) 5 MG tablet Take 1 tablet (5 mg total) by mouth at bedtime. 12/16/14   Lorayne Marek, MD  diclofenac sodium (VOLTAREN) 1 % GEL APPLY 4 GRAMS TOPICALLY 4 TIMES DAILY. 12/16/14   Lorayne Marek, MD  ferrous sulfate 325 (65 FE) MG tablet Take 1 tablet (325 mg total) by mouth daily  with breakfast. 02/14/15   Zada Finders, MD  gabapentin (NEURONTIN) 300 MG capsule Take 1 capsule (300 mg total) by mouth 4 (four) times daily. 12/30/14   Trula Slade, DPM  glipiZIDE (GLUCOTROL) 10 MG tablet TAKE 1 TABLET BY MOUTH 2 TIMES DAILY BEFORE A MEAL. 03/03/15   Boykin Nearing, MD  glucose blood (ACCU-CHEK AVIVA) test strip 1 each by Other route 3 (three) times daily. Use as instructed 03/03/15   Boykin Nearing, MD  Insulin Glargine (LANTUS SOLOSTAR) 100 UNIT/ML Solostar Pen Inject 65 Units into the skin at bedtime. 03/03/15   Josalyn Funches, MD  ketorolac (ACULAR) 0.4 % SOLN Place 1 drop into the left eye 4 (four) times daily.    Historical Provider, MD  Lancets (ACCU-CHEK MULTICLIX) lancets Use as instructed 06/19/14   Lorayne Marek, MD  loratadine (CLARITIN) 10 MG tablet Take 1 tablet (10 mg total) by mouth daily as needed for allergies or rhinitis. 01/22/14   Lorayne Marek, MD  ofloxacin (OCUFLOX) 0.3 % ophthalmic solution Place 1 drop into the left eye 4 (four) times daily.    Historical Provider, MD  oxymetazoline (AFRIN NASAL SPRAY) 0.05 % nasal spray Place 1 spray into both nostrils 2 (two) times daily as needed for congestion. 01/22/14   Lorayne Marek, MD  pantoprazole (PROTONIX) 40 MG tablet Take 1 tablet (40 mg total) by mouth daily. 02/14/15   Zada Finders, MD  prednisoLONE acetate (PRED FORTE) 1 % ophthalmic suspension Place 1 drop into the left eye 4 (four) times daily.    Historical Provider, MD  sitaGLIPtin-metformin (JANUMET) 50-1000 MG per tablet Take 1 tablet by mouth 2 (two) times daily with a meal. 03/03/15   Boykin Nearing, MD  Vitamin D, Ergocalciferol, (DRISDOL) 50000 UNITS CAPS capsule Take 50,000 Units by mouth every 7 (seven) days. sunday    Historical Provider, MD   BP 89/50 mmHg  Pulse 87  Temp(Src) 98 F (36.7 C) (Oral)  Resp 24  Ht 5\' 3"  (1.6 m)  Wt 184 lb (83.462 kg)  BMI 32.60 kg/m2  SpO2 99% Physical Exam  Constitutional: She is oriented to person,  place, and time. She appears well-developed and well-nourished.  Non-toxic appearance. No distress.  Afebrile, nontoxic, NAD, talking on the phone. Hypotensive in the 80s/50s. Pale appearing.   HENT:  Head: Normocephalic and atraumatic.  Mouth/Throat: Oropharynx is clear and moist. Mucous membranes are dry.  Dry mucous membranes  Eyes: Conjunctivae and EOM are normal. Pupils are equal, round, and reactive to light. Right eye exhibits no discharge. Left eye exhibits no discharge.  PERRL, EOMI, no nystagmus, no visual field deficits   Neck: Normal range of motion. Neck supple.  Cardiovascular: Normal rate, regular rhythm, normal heart sounds and intact distal pulses.  Exam reveals  no gallop and no friction rub.   No murmur heard. Pulmonary/Chest: Effort normal and breath sounds normal. No respiratory distress. She has no decreased breath sounds. She has no wheezes. She has no rhonchi. She has no rales.  Abdominal: Soft. Normal appearance and bowel sounds are normal. She exhibits no distension. There is no tenderness. There is no rigidity, no rebound, no guarding, no CVA tenderness, no tenderness at McBurney's point and negative Murphy's sign.  Genitourinary: Rectal exam shows no external hemorrhoid, no internal hemorrhoid, no fissure, no mass, no tenderness and anal tone normal. Guaiac positive stool. Pelvic exam was performed with patient in the knee-chest position.  Chaperone present Soft stool present in rectal vault. No gross blood noted on rectal exam, normal tone, no tenderness, no mass or fissure, no hemorrhoids. FOBT+  Musculoskeletal: Normal range of motion.  MAE x4 Strength and sensation grossly intact Distal pulses intact No pedal edema, neg homan's bilaterally   Neurological: She is alert and oriented to person, place, and time. She has normal strength. No cranial nerve deficit or sensory deficit. Coordination normal. GCS eye subscore is 4. GCS verbal subscore is 5. GCS motor subscore  is 6.  CN 2-12 grossly intact A&O x4 GCS 15 Sensation and strength intact Gait not assessed due to hypotension Coordination with finger-to-nose WNL Neg pronator drift   Skin: Skin is warm, dry and intact. No rash noted. There is pallor.  Pallor  Psychiatric: She has a normal mood and affect.  Nursing note and vitals reviewed.   ED Course  Procedures (including critical care time) Labs Review Labs Reviewed  COMPREHENSIVE METABOLIC PANEL - Abnormal; Notable for the following:    Total Protein 6.1 (*)    ALT 13 (*)    All other components within normal limits  CBC WITH DIFFERENTIAL/PLATELET - Abnormal; Notable for the following:    Hemoglobin 10.3 (*)    HCT 33.1 (*)    MCH 24.7 (*)    RDW 19.7 (*)    All other components within normal limits  I-STAT CG4 LACTIC ACID, ED - Abnormal; Notable for the following:    Lactic Acid, Venous 2.35 (*)    All other components within normal limits  POC OCCULT BLOOD, ED - Abnormal; Notable for the following:    Fecal Occult Bld POSITIVE (*)    All other components within normal limits  URINE CULTURE  LIPASE, BLOOD  URINALYSIS, ROUTINE W REFLEX MICROSCOPIC (NOT AT [facility])  I-STAT TROPOININ, ED  TYPE AND SCREEN    Imaging Review Dg Chest Port 1 View  03/03/2015   CLINICAL DATA:  Lightheadedness  EXAM: PORTABLE CHEST - 1 VIEW  COMPARISON:  07/21/2013  FINDINGS: The heart size and mediastinal contours are within normal limits. Both lungs are clear but hypo aerated with crowding of the bronchovascular markings. The visualized skeletal structures are unremarkable.  IMPRESSION: No active disease.   Electronically Signed   By: Conchita Paris M.D.   On: 03/03/2015 15:20   I have personally reviewed and evaluated these images and lab results as part of my medical decision-making.  Capsule endoscopy on 8/18: Results were inadequate   Ct Entero Abd/pelvis W/cm 03/01/2015  CLINICAL DATA: Iron deficiency anemia. Abdominal swelling and nausea for  several weeks. GI bleeding. Dizziness. EXAM: CT ABDOMEN AND PELVIS WITH CONTRAST (ENTEROGRAPHY) TECHNIQUE: Multidetector CT of the abdomen and pelvis during bolus administration of intravenous contrast. Negative oral contrast VoLumen was given. CONTRAST: 119mL OMNIPAQUE IOHEXOL 300 MG/ML SOLN COMPARISON: 07/23/2014 FINDINGS:  Lower Chest: No acute findings. Hepatobiliary: No masses or other significant abnormality identified. Prior cholecystectomy noted. No evidence of biliary dilatation. Pancreas: No mass, inflammatory changes, or other significant abnormality identified. Spleen: Within normal limits in size and appearance. Adrenals: No masses identified. Kidneys/Urinary Tract: No evidence of masses or hydronephrosis. Stomach/Bowel/Peritoneum: No evidence of small bowel wall thickening or dilatation. No evidence of abnormal mucosal enhancement or mesenteric inflammatory changes. No evidence of fistulization or extraluminal gas or fluid collections. The terminal ileum is normal in appearance. No other areas of bowel wall thickening identified. Normal appendix visualized. Vascular/Lymphatic: No pathologically enlarged lymph nodes identified. No abdominal aortic aneurysm or other significant retroperitoneal abnormality demonstrated. Reproductive: Prior hysterectomy noted. Adnexal regions are unremarkable in appearance. Other: None. Musculoskeletal: No suspicious bone lesions identified. IMPRESSION: Negative. No evidence of inflammatory bowel disease or other significant abnormality. Electronically Signed By: Earle Gell M.D. On: 03/01/2015 07:27   2D Echo: TTE 09/26/2014 - Left ventricle: The cavity size was normal. There was moderateconcentric hypertrophy. Systolic function was normal. Theestimated ejection fraction was in the range of 60% to 65%. Wallmotion was normal; there were no regional wall motionabnormalities. Doppler parameters are consistent withpseudonormal left  ventricular relaxation (grade 2 diastolicdysfunction). The E/e&' ratio is >15, suggesting elevated LVfilling pressure. - Aortic valve: Trileaflet. Sclerosis without stenosis. - Mitral valve: Mildly thickened leaflets . There was moderateregurgitation. - Left atrium: The atrium was at the upper limits of normal insize. - Atrial septum: No defect or patent foramen ovale was identified. - Inferior vena cava: The vessel was normal in size. Therespirophasic diameter changes were in the normal range (>= 50%),consistent with normal central venous pressure.  Cardiac Cath: 10/03/2014 1. Patent stent mid Circumflex with mild restenosis 2. Diffuse non-obstructive disease in the RCA and LAD   EKG Interpretation None      MDM   Final diagnoses:  Hypotension, unspecified hypotension type  Symptomatic anemia  Gastrointestinal hemorrhage, unspecified gastritis, unspecified gastrointestinal hemorrhage type  Near syncope  Iron deficiency anemia due to chronic blood loss    64 y.o. female here with lightheadedness with standing and hypotension. Has been admitted 2 times in last 1 month for orthostatic hypotension and GI bleed, has required multiple transfusions. Was at PCP f/up today, felt well initially, was found to be hyperglycemic therefore given 20U novolog, later started feeling lightheaded with standing. Ate glucose tabs without relief. BP found to be 80s/50s. No pain anywhere. No HA/vision changes. Nonfocal neuro exam, did not ambulate pt due to severe hypotension. Recent admission did not find source of GI bleeding, they question if it could be an AVM. Reports darker stools last night, on iron therapy. Will obtain labs, EKG, orthostatics, type and screen (in case transfusion needed if H/H dropped), and CXR to eval for any sources of infection although no CP/SOB or cough. FOBT card sent. No gross blood on rectal, no tenderness, soft stool in vault. No hemorrhoids. Will give fluids and reassess  shortly.   3:53 PM Pt feeling improved after some fluids, BP up to 120s/70s. Lactic acid 2.35. Trop neg. CMP unremarkable. CBC with stable H/H. Lipase WNL. FOBT+. CXR clear. EKG unchanged from prior. Still awaiting U/A and orthostatic VS. Will ask nursing for this. Pt very adamant that she doesn't want to stay. Will sign out care to [facility] PA-C who will f/up with orthostatics and U/A. Plan is if normal, then could leave; if any indication for admission then admit to IM teaching service. Please see her note for  further documentation of care.   BP 100/69 mmHg  Pulse 79  Temp(Src) 98 F (36.7 C) (Oral)  Resp 17  Ht 5\' 3"  (1.6 m)  Wt 184 lb (83.462 kg)  BMI 32.60 kg/m2  SpO2 100%  Meds ordered this encounter  Medications  . 0.9 %  sodium chloride infusion    Sig:   . sodium chloride 0.9 % bolus 1,000 mL    Sig:     Order Specific Question:  Weight basis for 30 ml/kg NS bolus    Answer:  184 lb (83.462 kg)   Followed by  . sodium chloride 0.9 % bolus 500 mL    Sig:     Order Specific Question:  Weight basis for 30 ml/kg NS bolus    Answer:  184 lb (83.462 kg)      Marly Schuld Camprubi-Soms, PA-C 03/03/15 Fairlawn Liu, MD 03/03/15 [phone]

## 2015-03-03 NOTE — Progress Notes (Signed)
Patient was in lobby waiting to pick up medications and staff Notified Sarah Alvarez patient felt like she was going to pass out Blood sugar is at 147 Blood pressure is at 86/58 pulse 94 Patient put in wheel chair and in exam room 3 with dr Adrian Blackwater To be evaluated by

## 2015-03-03 NOTE — Progress Notes (Signed)
Patient in w/c. Given sprite and pretzels; CBG recheck 114 Patient states she took lantus 65 units last evening And was recently hospitalized for low hemoglobin and received transusion Unable to auscultate BP.  P 85 Patient assisted to table and placed in supine position with knees raised Patient continues to speak and understand commands Drinking ginger-ale and eating pretzels PCP in to re-examine [place] received to send to [facility] ED by emergency EMS Report given to [facility] ED charge RN, Gabriel Cirri

## 2015-03-04 LAB — MICROALBUMIN, URINE: MICROALB UR: 1.3 mg/dL (ref <2.0)

## 2015-03-05 LAB — URINE CULTURE

## 2015-03-10 ENCOUNTER — Other Ambulatory Visit: Payer: Self-pay | Admitting: Internal Medicine

## 2015-03-18 ENCOUNTER — Encounter: Payer: Self-pay | Admitting: Family

## 2015-03-19 ENCOUNTER — Ambulatory Visit (INDEPENDENT_AMBULATORY_CARE_PROVIDER_SITE_OTHER): Payer: Medicaid Other | Admitting: Family

## 2015-03-19 ENCOUNTER — Encounter: Payer: Self-pay | Admitting: Family

## 2015-03-19 VITALS — BP 135/77 | HR 100 | Temp 99.4°F | Ht 63.0 in | Wt 181.9 lb

## 2015-03-19 DIAGNOSIS — Z9889 Other specified postprocedural states: Secondary | ICD-10-CM | POA: Diagnosis not present

## 2015-03-19 DIAGNOSIS — I6523 Occlusion and stenosis of bilateral carotid arteries: Secondary | ICD-10-CM

## 2015-03-19 NOTE — Progress Notes (Signed)
Established Carotid Patient   History of Present Illness  Sarah Alvarez is a 64 y.o. female patient of Dr. Scot Dock who underwent retrograde left common carotid artery stenting by Dr. Ruta Hinds on 03/05/2014 for a high-grade left common carotid artery stenosis. She also has undergone right carotid endarterectomy on 07/19/2013.   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 This was done by interventional radiology.  Dr. Scot Dock saw pt on 09/25/14. At that time the patient had a 60-79% left carotid stenosis. Dr. Scot Dock recommended a follow up carotid duplex scan in 6 months. Pt had undergone a left carotid stent although this cannot be seen by duplex. Dr. Scot Dock indicated the only way to really follow that would be cerebral arteriography and Dr. Scot Dock did not think that is indicated given that she is asymptomatic and given the small risk of stroke associated with this procedure. On the right side she had a mild recurrent stenosis of 40-59%. She denies loss of vision in one eye vs the other, denies dizziness, denies post prandial abdominal pain since SMA stenting done, denies aphasia or confusion.  She has no known history of stroke or TIA.  She did not have a carotid Duplex today. It seems her insurance requires she be evaluated by a medical provider before this is approved to be covered. She apparently is here today for this. She is scheduled for carotid Duplex on 04/02/15.  She was hospitalized 02/25/15 then seen in [facility] ED on 03/03/15 for syncope, severe IDA, chronic blood loss, had iron and blood transfusions. Pt states source of blood loss could not be found, but is feeling better.  She is on a statin. She is on Plavix.  She denies any history of MI or kidney problems.  The patient's previous neurologic deficits are Unchanged.  She denies claudication symptoms with walking, denies non healing  wounds in legs or feet.   Pt Diabetic: Yes,  last A1C was 8.5 in June 2016 (review of records)  Pt smoker: former smoker, quit during January, 2015 hospital admission for right CEA, but is yet exposed to 2 smokers in her house.   Pt meds include:  Statin : Yes  ASA: no, stopped due to chronic blood loss, unknown etiology Other anticoagulants/antiplatelets: Plavix   Past Medical History  Diagnosis Date  . Staph infection   . Restless leg syndrome   . Chronic mesenteric ischemia     s/p SMA stent 06/22/13  . Coronary artery disease   . Hypertension   . Hyperlipidemia   . Heart murmur   . Asthma     "mild touch" (02/11/2015)  . Anemia   . GERD (gastroesophageal reflux disease)   . Chronic lower back pain   . Type II diabetes mellitus   . Diabetic peripheral neuropathy     "in my legs" (02/11/2015)  . Daily headache   . Arthritis     "hands" (02/25/2015)    Social History Social History  Substance Use Topics  . Smoking status: Former Smoker -- 1.00 packs/day for 20 years    Types: Cigarettes    Quit date: 06/21/2013  . Smokeless tobacco: Never Used  . Alcohol Use: No    Family History Family History  Problem Relation Age of Onset  . Diabetes Mother   . Stroke Mother   . Hyperlipidemia Mother   . Cancer Father   . Heart disease Father  PVD and  CAROTID  . Heart attack Father   . Diabetes Sister   . Hypertension Sister   . Heart disease Sister     Before age 65  . Hyperlipidemia Sister   . Varicose Veins Sister   . Cancer Brother   . Cancer Maternal Aunt     breast cancer   . Diabetes Maternal Grandmother   . Diabetes Paternal Grandmother     Surgical History Past Surgical History  Procedure Laterality Date  . Endarterectomy Right 07/19/2013    Procedure: ENDARTERECTOMY CAROTID-RIGHT;  Surgeon: Angelia Mould, MD;  Location: [facility];  Service: Vascular;  Laterality: Right;  . Insertion of retrograde carotid stent Left 03/05/2014    Procedure:  1)Cutdown of open exposure left common carotid; 2) Retrograde left common carotid stent; 3) Repair of left common carotid artery.;  Surgeon: Elam Dutch, MD;  Location: [facility];  Service: Vascular;  Laterality: Left;  . Arch aortogram N/A 02/11/2014    Procedure: ARCH AORTOGRAM;  Surgeon: Angelia Mould, MD;  Location: [facility] CATH LAB;  Service: Cardiovascular;  Laterality: N/A;  . Left heart catheterization with coronary angiogram N/A 10/03/2014    Procedure: LEFT HEART CATHETERIZATION WITH CORONARY ANGIOGRAM;  Surgeon: Burnell Blanks, MD;  Location: [facility] CATH LAB;  Service: Cardiovascular;  Laterality: N/A;  . Cataract extraction w/ intraocular lens implant Left 02/03/2015  . Tonsillectomy  1950's  . Finger fracture surgery Left 2015    "they had to rebreak it and put pins in it"  . Fracture surgery    . Abdominal hysterectomy    . Coronary angioplasty      ? LCX stent ~ 2000 in [place], [place] with LCX stent [facility] 09/1999 for reported re-instent stenosis  . Esophagogastroduodenoscopy (egd) with propofol Left 02/13/2015    Procedure: ESOPHAGOGASTRODUODENOSCOPY (EGD) WITH PROPOFOL;  Surgeon: Ronald Lobo, MD;  Location: [place];  Service: Endoscopy;  Laterality: Left;  . Colonoscopy with propofol N/A 02/14/2015    Procedure: COLONOSCOPY WITH PROPOFOL;  Surgeon: Ronald Lobo, MD;  Location: [facility] ENDOSCOPY;  Service: Endoscopy;  Laterality: N/A;  . Laparoscopic cholecystectomy    . Givens capsule study N/A 02/27/2015    Procedure: GIVENS CAPSULE STUDY;  Surgeon: Wilford Corner, MD;  Location: [facility] ENDOSCOPY;  Service: Endoscopy;  Laterality: N/A;    No Known Allergies  Current Outpatient Prescriptions  Medication Sig Dispense Refill  . acetaminophen (TYLENOL) 650 MG CR tablet Take 650 mg by mouth every 8 (eight) hours as needed for pain.    Marland Kitchen atorvastatin (LIPITOR) 20 MG tablet Take 1 tablet (20 mg total) by mouth daily. 30 tablet 5  . benazepril (LOTENSIN) 5 MG tablet TAKE 2 TABLETS  BY MOUTH EVERY MORNING. 60 tablet 3  . cephALEXin (KEFLEX) 500 MG capsule Take 1 capsule (500 mg total) by mouth 2 (two) times daily. 14 capsule 0  . clopidogrel (PLAVIX) 75 MG tablet Take 1 tablet (75 mg total) by mouth daily with breakfast. 30 tablet 3  . cyclobenzaprine (FLEXERIL) 5 MG tablet Take 1 tablet (5 mg total) by mouth at bedtime. 30 tablet 3  . diclofenac sodium (VOLTAREN) 1 % GEL APPLY 4 GRAMS TOPICALLY 4 TIMES DAILY. 100 g 2  . ferrous sulfate 325 (65 FE) MG tablet Take 1 tablet (325 mg total) by mouth daily with breakfast. 30 tablet 3  . gabapentin (NEURONTIN) 300 MG capsule Take 1 capsule (300 mg total) by mouth 4 (four) times daily. 90 capsule 3  . glipiZIDE (GLUCOTROL)  10 MG tablet TAKE 1 TABLET BY MOUTH 2 TIMES DAILY BEFORE A MEAL. 60 tablet 3  . glucose blood (ACCU-CHEK AVIVA) test strip 1 each by Other route 3 (three) times daily. Use as instructed 100 each 12  . Insulin Glargine (LANTUS SOLOSTAR) 100 UNIT/ML Solostar Pen Inject 65 Units into the skin at bedtime. 15 mL 3  . ketorolac (ACULAR) 0.4 % SOLN Place 1 drop into the left eye 4 (four) times daily.    . Lancets (ACCU-CHEK MULTICLIX) lancets Use as instructed 100 each 12  . loratadine (CLARITIN) 10 MG tablet Take 1 tablet (10 mg total) by mouth daily as needed for allergies or rhinitis. 30 tablet 11  . ofloxacin (OCUFLOX) 0.3 % ophthalmic solution Place 1 drop into the left eye 4 (four) times daily.    Marland Kitchen oxymetazoline (AFRIN NASAL SPRAY) 0.05 % nasal spray Place 1 spray into both nostrils 2 (two) times daily as needed for congestion. 30 mL 0  . pantoprazole (PROTONIX) 40 MG tablet Take 1 tablet (40 mg total) by mouth daily. 30 tablet 11  . prednisoLONE acetate (PRED FORTE) 1 % ophthalmic suspension Place 1 drop into the left eye 4 (four) times daily.    . sitaGLIPtin-metformin (JANUMET) 50-1000 MG per tablet Take 1 tablet by mouth 2 (two) times daily with a meal. 60 tablet 3  . Vitamin D, Ergocalciferol, (DRISDOL) 50000  UNITS CAPS capsule Take 50,000 Units by mouth every 7 (seven) days. sunday    . [DISCONTINUED] colchicine 0.6 MG tablet Take two tab PO, then take 1 tab PO 1 hour later 6 tablet 0   No current facility-administered medications for this visit.    Review of Systems : See HPI for pertinent positives and negatives.  Physical Examination  Filed Vitals:   03/19/15 1319 03/19/15 1323  BP: 123/75 135/77  Pulse: 100   Temp: 99.4 F (37.4 C)   TempSrc: Oral   Height: 5\' 3"  (1.6 m)   Weight: 181 lb 14.4 oz (82.509 kg)   SpO2: 98%    Body mass index is 32.23 kg/(m^2).    General: WDWN obese female in NAD  GAIT: normal  Eyes: PERRLA  Pulmonary: Non-labored, CTAB, no rales, no rhonchi, & no wheezing.  Cardiac: regular Rhythm, positive murmur.   VASCULAR EXAM  Carotid Bruits  Left  Right    Positive, soft  Positive, harsh   Radial pulses are 2+ palpable and equal.  LE Pulses  LEFT  RIGHT   POPLITEAL  not palpable  not palpable   POSTERIOR TIBIAL  2+ palpable  2+ palpable   DORSALIS PEDIS  ANTERIOR TIBIAL  not palpable  2+ palpable    Gastrointestinal: soft, nontender, BS WNL, no r/g, no masses palpated.  Musculoskeletal: Negative muscle atrophy/wasting. M/S 5/5 in left upper extremity, 4/5 in left lower extremity, 3/5 in right upper and lower extremities, Extremities without ischemic changes.  Neurologic: A&O X 3; Appropriate Affect, Speech is normal  CN 2-12 intact, Pain and light touch intact in extremities, Motor exam as listed above.             Assessment: Sarah Alvarez is a 64 y.o. female who is s/p right carotid endarterectomy with Dacron patch angioplasty on 07/19/2013 and on 03/05/14 Dr. Oneida Alar and Dr. Scot Dock inserted a stent in her left CCA.  She has no history of stroke or TIA. Carotid Duplex not performed today; this is scheduled for 04/02/15.   Plan: Follow-up as scheduled on 04/02/15 with Carotid  Duplex as already scheduled.     I discussed in depth with the patient the nature of atherosclerosis, and emphasized the importance of maximal medical management including strict control of blood pressure, blood glucose, and lipid levels, obtaining regular exercise, and continued cessation of smoking.  The patient is aware that without maximal medical management the underlying atherosclerotic disease process will progress, limiting the benefit of any interventions. The patient was given information about stroke prevention and what symptoms should prompt the patient to seek immediate medical care. Thank you for allowing [place] to participate in this patient's care.  Clemon Chambers, RN, MSN, FNP-C [facility] Office: [phone]  Clinic Physician: Scot Dock  03/19/2015 1:25 PM

## 2015-03-19 NOTE — Patient Instructions (Signed)
Stroke Prevention Some medical conditions and behaviors are associated with an increased chance of having a stroke. You may prevent a stroke by making healthy choices and managing medical conditions. HOW CAN I REDUCE MY RISK OF HAVING A STROKE?   Stay physically active. Get at least 30 minutes of activity on most or all days.  Do not smoke. It may also be helpful to avoid exposure to secondhand smoke.  Limit alcohol use. Moderate alcohol use is considered to be:  No more than 2 drinks per day for men.  No more than 1 drink per day for nonpregnant women.  Eat healthy foods. This involves:  Eating 5 or more servings of fruits and vegetables a day.  Making dietary changes that address high blood pressure (hypertension), high cholesterol, diabetes, or obesity.  Manage your cholesterol levels.  Making food choices that are high in fiber and low in saturated fat, trans fat, and cholesterol may control cholesterol levels.  Take any prescribed medicines to control cholesterol as directed by your health care provider.  Manage your diabetes.  Controlling your carbohydrate and sugar intake is recommended to manage diabetes.  Take any prescribed medicines to control diabetes as directed by your health care provider.  Control your hypertension.  Making food choices that are low in salt (sodium), saturated fat, trans fat, and cholesterol is recommended to manage hypertension.  Take any prescribed medicines to control hypertension as directed by your health care provider.  Maintain a healthy weight.  Reducing calorie intake and making food choices that are low in sodium, saturated fat, trans fat, and cholesterol are recommended to manage weight.  Stop drug abuse.  Avoid taking birth control pills.  Talk to your health care provider about the risks of taking birth control pills if you are over 35 years old, smoke, get migraines, or have ever had a blood clot.  Get evaluated for sleep  disorders (sleep apnea).  Talk to your health care provider about getting a sleep evaluation if you snore a lot or have excessive sleepiness.  Take medicines only as directed by your health care provider.  For some people, aspirin or blood thinners (anticoagulants) are helpful in reducing the risk of forming abnormal blood clots that can lead to stroke. If you have the irregular heart rhythm of atrial fibrillation, you should be on a blood thinner unless there is a good reason you cannot take them.  Understand all your medicine instructions.  Make sure that other conditions (such as anemia or atherosclerosis) are addressed. SEEK IMMEDIATE MEDICAL CARE IF:   You have sudden weakness or numbness of the face, arm, or leg, especially on one side of the body.  Your face or eyelid droops to one side.  You have sudden confusion.  You have trouble speaking (aphasia) or understanding.  You have sudden trouble seeing in one or both eyes.  You have sudden trouble walking.  You have dizziness.  You have a loss of balance or coordination.  You have a sudden, severe headache with no known cause.  You have new chest pain or an irregular heartbeat. Any of these symptoms may represent a serious problem that is an emergency. Do not wait to see if the symptoms will go away. Get medical help at once. Call your local emergency services (911 in [place] Do not drive yourself to the hospital. Document Released: 08/05/2004 Document Revised: 11/12/2013 Document Reviewed: 12/29/2012 Exit[employer] Patient Information 2015 [employer]. This information is not intended to replace advice given   to you by your health care provider. Make sure you discuss any questions you have with your health care provider.  

## 2015-03-20 ENCOUNTER — Ambulatory Visit: Payer: Medicaid Other | Admitting: Internal Medicine

## 2015-03-24 ENCOUNTER — Other Ambulatory Visit: Payer: Self-pay | Admitting: Internal Medicine

## 2015-03-24 ENCOUNTER — Telehealth: Payer: Self-pay | Admitting: *Deleted

## 2015-03-24 DIAGNOSIS — R200 Anesthesia of skin: Secondary | ICD-10-CM

## 2015-03-24 DIAGNOSIS — E1149 Type 2 diabetes mellitus with other diabetic neurological complication: Secondary | ICD-10-CM

## 2015-03-24 MED ORDER — GABAPENTIN 300 MG PO CAPS: 300.0000 mg | ORAL_CAPSULE | Freq: Four times a day (QID) | ORAL | Status: DC

## 2015-03-24 NOTE — Telephone Encounter (Signed)
Pt states has rx for Gabapentin from Dr. Jacqualyn Posey and she's about to run out.  Dr. Jacqualyn Posey states refill as previously +12 refills, and pt needs to be seen as need for foot problems, but needs to have yearly foot checks to continue medication.

## 2015-03-25 ENCOUNTER — Ambulatory Visit: Payer: Medicaid Other | Attending: Family Medicine | Admitting: Pharmacist

## 2015-03-25 DIAGNOSIS — E1159 Type 2 diabetes mellitus with other circulatory complications: Secondary | ICD-10-CM | POA: Insufficient documentation

## 2015-03-25 DIAGNOSIS — E1165 Type 2 diabetes mellitus with hyperglycemia: Secondary | ICD-10-CM

## 2015-03-25 DIAGNOSIS — IMO0002 Reserved for concepts with insufficient information to code with codable children: Secondary | ICD-10-CM

## 2015-03-25 DIAGNOSIS — E1151 Type 2 diabetes mellitus with diabetic peripheral angiopathy without gangrene: Secondary | ICD-10-CM

## 2015-03-25 DIAGNOSIS — Z794 Long term (current) use of insulin: Secondary | ICD-10-CM | POA: Diagnosis not present

## 2015-03-25 NOTE — Progress Notes (Signed)
S:    Patient arrives in a pleasant mood. Presents for diabetes follow up. Patient reports having history of Diabetes since 2014.   Patient reports adherence with medications. Current diabetes medications include Lantus 65 units daily, glipizide 10mg  twice daily, and Janumet 50/1000mg  twice daily.  Patient denies hypoglycemic events. Did not bring BG logs to visit today.  Patient reported dietary habits: Has cut out sugary drinks. Drinks fruit juice on occasion. Largest meal is breakfast.  Patient reported exercise habits: Works as a Electrical engineer once a week. Does not exercise otherwise.   Patient denies nocturia. Patient reports neuropathy. Has long-standing tingling in her feet. Prescribed gabapentin per Dr. Jacqualyn Posey. Patient reports visual changes due to cataracts. Has had cataract surgery in one eye already but, surgery for other eye on hold until blood sugar is under better control.    O:  . Lab Results  Component Value Date   HGBA1C 8.50 12/16/2014    Patient did not bring in meter or log. Home CBGs (reported): 100s-200s, highest 208  A/P: Diabetes diagnosed in 2014 currently uncontrolled based on A1c of 8.5.  Denies hypoglycemic events and is able to verbalize appropriate hypoglycemia management plan.  reports adherence with medication. Control is suboptimal due to sedentary lifestyle and poor diet. Patient recently prescribed Janumet for additional glycemic control for which she has been taking for approximately one week.  Unclear to what actual values are. Instructed the patient to continue taking her diabetes medications as prescribed and to log her blood sugars for the next two weeks. At follow up visit, will review readings, obtain a new A1c as she is due, and make further adjustments to regimen. I expect that by holding A1c by two weeks, the reading will be slightly improved and possibly <8% due to blood glucose <200 over the last few weeks.  Next A1C anticipated 03/2015.   Written patient instructions provided.  Follow up in Pharmacist Clinic Visit in 2 weeks.   Total time in face to face counseling 35 minutes.  Patient seen with [facility] PharmD resident.

## 2015-03-25 NOTE — Patient Instructions (Signed)
Good to see today!   Continue taking your diabetes medications exactly as you have been doing.  Check your blood sugars for the next 2 weeks and record them on the sheets provided. Bring your results to the next appointment.  Follow up in the Pharmacy Clinic with Nicoletta Ba in 2 weeks

## 2015-03-26 ENCOUNTER — Telehealth: Payer: Self-pay | Admitting: General Practice

## 2015-03-26 DIAGNOSIS — E139 Other specified diabetes mellitus without complications: Secondary | ICD-10-CM

## 2015-03-26 DIAGNOSIS — E1165 Type 2 diabetes mellitus with hyperglycemia: Secondary | ICD-10-CM

## 2015-03-26 DIAGNOSIS — R200 Anesthesia of skin: Secondary | ICD-10-CM

## 2015-03-26 DIAGNOSIS — I10 Essential (primary) hypertension: Secondary | ICD-10-CM

## 2015-03-26 DIAGNOSIS — M545 Low back pain, unspecified: Secondary | ICD-10-CM

## 2015-03-26 DIAGNOSIS — E1149 Type 2 diabetes mellitus with other diabetic neurological complication: Secondary | ICD-10-CM

## 2015-03-26 DIAGNOSIS — IMO0002 Reserved for concepts with insufficient information to code with codable children: Secondary | ICD-10-CM

## 2015-03-26 DIAGNOSIS — I2510 Atherosclerotic heart disease of native coronary artery without angina pectoris: Secondary | ICD-10-CM

## 2015-03-26 DIAGNOSIS — E1151 Type 2 diabetes mellitus with diabetic peripheral angiopathy without gangrene: Secondary | ICD-10-CM

## 2015-03-26 DIAGNOSIS — G8929 Other chronic pain: Secondary | ICD-10-CM

## 2015-03-26 DIAGNOSIS — E7800 Pure hypercholesterolemia, unspecified: Secondary | ICD-10-CM

## 2015-03-26 NOTE — Telephone Encounter (Signed)
Patient presents to clinic requesting medication refill on ALL medications that are out of refills Please assist

## 2015-03-27 ENCOUNTER — Other Ambulatory Visit: Payer: Self-pay | Admitting: Family Medicine

## 2015-03-27 DIAGNOSIS — Z9109 Other allergy status, other than to drugs and biological substances: Secondary | ICD-10-CM

## 2015-03-27 MED ORDER — PANTOPRAZOLE SODIUM 40 MG PO TBEC: 40.0000 mg | DELAYED_RELEASE_TABLET | Freq: Every day | ORAL | Status: AC

## 2015-03-27 MED ORDER — LORATADINE 10 MG PO TABS: 10.0000 mg | ORAL_TABLET | Freq: Every day | ORAL | Status: DC | PRN

## 2015-03-27 MED ORDER — GABAPENTIN 300 MG PO CAPS: 300.0000 mg | ORAL_CAPSULE | Freq: Three times a day (TID) | ORAL | Status: AC

## 2015-03-27 MED ORDER — METFORMIN HCL 1000 MG PO TABS: 1000.0000 mg | ORAL_TABLET | Freq: Two times a day (BID) | ORAL | Status: DC

## 2015-03-27 MED ORDER — ATORVASTATIN CALCIUM 20 MG PO TABS: 20.0000 mg | ORAL_TABLET | Freq: Every day | ORAL | Status: AC

## 2015-03-27 MED ORDER — INSULIN GLARGINE 100 UNIT/ML SOLOSTAR PEN: 65.0000 [IU] | PEN_INJECTOR | Freq: Every day | SUBCUTANEOUS | Status: DC

## 2015-03-27 MED ORDER — CLOPIDOGREL BISULFATE 75 MG PO TABS: 75.0000 mg | ORAL_TABLET | Freq: Every day | ORAL | Status: DC

## 2015-03-27 MED ORDER — ACETAMINOPHEN ER 650 MG PO TBCR: 650.0000 mg | EXTENDED_RELEASE_TABLET | Freq: Three times a day (TID) | ORAL | Status: AC | PRN

## 2015-03-27 MED ORDER — FERROUS SULFATE 325 (65 FE) MG PO TABS: 325.0000 mg | ORAL_TABLET | Freq: Every day | ORAL | Status: AC

## 2015-03-27 MED ORDER — LORATADINE 10 MG PO TABS: 10.0000 mg | ORAL_TABLET | Freq: Every day | ORAL | Status: AC | PRN

## 2015-03-27 MED ORDER — CYCLOBENZAPRINE HCL 5 MG PO TABS: 5.0000 mg | ORAL_TABLET | Freq: Every day | ORAL | Status: DC

## 2015-03-27 MED ORDER — BENAZEPRIL HCL 10 MG PO TABS
10.0000 mg | ORAL_TABLET | Freq: Every day | ORAL | Status: AC
Start: 2015-03-27 — End: ?

## 2015-03-27 MED ORDER — SITAGLIPTIN PHOSPHATE 100 MG PO TABS: 100.0000 mg | ORAL_TABLET | Freq: Every day | ORAL | Status: DC

## 2015-03-27 MED ORDER — DICLOFENAC SODIUM 1 % TD GEL: TRANSDERMAL | Status: DC

## 2015-03-27 NOTE — Telephone Encounter (Signed)
meds refilled 

## 2015-03-31 ENCOUNTER — Encounter: Payer: Self-pay | Admitting: Family

## 2015-04-02 ENCOUNTER — Ambulatory Visit (HOSPITAL_COMMUNITY)
Admission: RE | Admit: 2015-04-02 | Discharge: 2015-04-02 | Disposition: A | Discharge status: Home / Self Care | Payer: Medicaid Other | Source: Ambulatory Visit | Attending: Family | Admitting: Family

## 2015-04-02 ENCOUNTER — Other Ambulatory Visit: Payer: Self-pay | Admitting: *Deleted

## 2015-04-02 ENCOUNTER — Ambulatory Visit (INDEPENDENT_AMBULATORY_CARE_PROVIDER_SITE_OTHER): Payer: Medicaid Other | Admitting: Family

## 2015-04-02 ENCOUNTER — Encounter: Payer: Self-pay | Admitting: Family

## 2015-04-02 VITALS — BP 115/80 | HR 83 | Temp 97.1°F | Resp 16 | Ht 63.0 in | Wt 184.0 lb

## 2015-04-02 DIAGNOSIS — I6523 Occlusion and stenosis of bilateral carotid arteries: Secondary | ICD-10-CM | POA: Insufficient documentation

## 2015-04-02 DIAGNOSIS — I10 Essential (primary) hypertension: Secondary | ICD-10-CM | POA: Insufficient documentation

## 2015-04-02 DIAGNOSIS — E785 Hyperlipidemia, unspecified: Secondary | ICD-10-CM | POA: Insufficient documentation

## 2015-04-02 DIAGNOSIS — Z9889 Other specified postprocedural states: Secondary | ICD-10-CM | POA: Diagnosis not present

## 2015-04-02 DIAGNOSIS — Z959 Presence of cardiac and vascular implant and graft, unspecified: Secondary | ICD-10-CM

## 2015-04-02 DIAGNOSIS — E1140 Type 2 diabetes mellitus with diabetic neuropathy, unspecified: Secondary | ICD-10-CM | POA: Diagnosis not present

## 2015-04-02 NOTE — Progress Notes (Signed)
Filed Vitals:   04/02/15 1336 04/02/15 1339 04/02/15 1340  BP: 148/78 152/85 115/80  Pulse: 88 84 83  Temp: 97.1 F (36.2 C)    Resp: 16    Height: 5\' 3"  (1.6 m)    Weight: 184 lb (83.462 kg)    SpO2: 100%

## 2015-04-02 NOTE — Patient Instructions (Signed)
Stroke Prevention Some medical conditions and behaviors are associated with an increased chance of having a stroke. You may prevent a stroke by making healthy choices and managing medical conditions. HOW CAN I REDUCE MY RISK OF HAVING A STROKE?   Stay physically active. Get at least 30 minutes of activity on most or all days.  Do not smoke. It may also be helpful to avoid exposure to secondhand smoke.  Limit alcohol use. Moderate alcohol use is considered to be:  No more than 2 drinks per day for men.  No more than 1 drink per day for nonpregnant women.  Eat healthy foods. This involves:  Eating 5 or more servings of fruits and vegetables a day.  Making dietary changes that address high blood pressure (hypertension), high cholesterol, diabetes, or obesity.  Manage your cholesterol levels.  Making food choices that are high in fiber and low in saturated fat, trans fat, and cholesterol may control cholesterol levels.  Take any prescribed medicines to control cholesterol as directed by your health care provider.  Manage your diabetes.  Controlling your carbohydrate and sugar intake is recommended to manage diabetes.  Take any prescribed medicines to control diabetes as directed by your health care provider.  Control your hypertension.  Making food choices that are low in salt (sodium), saturated fat, trans fat, and cholesterol is recommended to manage hypertension.  Take any prescribed medicines to control hypertension as directed by your health care provider.  Maintain a healthy weight.  Reducing calorie intake and making food choices that are low in sodium, saturated fat, trans fat, and cholesterol are recommended to manage weight.  Stop drug abuse.  Avoid taking birth control pills.  Talk to your health care provider about the risks of taking birth control pills if you are over 35 years old, smoke, get migraines, or have ever had a blood clot.  Get evaluated for sleep  disorders (sleep apnea).  Talk to your health care provider about getting a sleep evaluation if you snore a lot or have excessive sleepiness.  Take medicines only as directed by your health care provider.  For some people, aspirin or blood thinners (anticoagulants) are helpful in reducing the risk of forming abnormal blood clots that can lead to stroke. If you have the irregular heart rhythm of atrial fibrillation, you should be on a blood thinner unless there is a good reason you cannot take them.  Understand all your medicine instructions.  Make sure that other conditions (such as anemia or atherosclerosis) are addressed. SEEK IMMEDIATE MEDICAL CARE IF:   You have sudden weakness or numbness of the face, arm, or leg, especially on one side of the body.  Your face or eyelid droops to one side.  You have sudden confusion.  You have trouble speaking (aphasia) or understanding.  You have sudden trouble seeing in one or both eyes.  You have sudden trouble walking.  You have dizziness.  You have a loss of balance or coordination.  You have a sudden, severe headache with no known cause.  You have new chest pain or an irregular heartbeat. Any of these symptoms may represent a serious problem that is an emergency. Do not wait to see if the symptoms will go away. Get medical help at once. Call your local emergency services (911 in [place] Do not drive yourself to the hospital. Document Released: 08/05/2004 Document Revised: 11/12/2013 Document Reviewed: 12/29/2012 ExitCare Patient Information 2015 [employer]. This information is not intended to replace advice given   to you by your health care provider. Make sure you discuss any questions you have with your health care provider.   Secondhand Smoke Secondhand smoke is the smoke exhaled by smokers and the smoke given off by a burning cigarette, cigar, or pipe. When a cigarette is smoked, about half of the smoke is inhaled and exhaled by  the smoker, and the other half floats around in the air. Exposure to secondhand smoke is also called involuntary smoking or passive smoking. People can be exposed to secondhand smoke in:   Homes.  Cars.  Workplaces.  Public places (bars, restaurants, other recreation sites). Exposure to secondhand smoke is hazardous.It contains more than 250 harmful chemicals, including at least 60 that can cause cancer. These chemicals include:  Arsenic, a heavy metal toxin.  Benzene, a chemical found in gasoline.  Beryllium, a toxic metal.  Cadmium, a metal used in batteries.  Chromium, a metallic element.  Ethylene oxide, a chemical used to sterilize medical devices.  Nickel, a metallic element.  Polonium-210, a chemical element that gives off radiation.  Vinyl chloride, a toxic substance used in the manufacture of plastics. Nonsmoking spouses and family members of smokers have higher rates of cancer, heart disease, and serious respiratory illnesses than those not exposed to secondhand smoke.  Nicotine, a nicotine by-product called cotinine, carbon monoxide, and other evidence of secondhand smoke exposure have been found in the body fluids of nonsmokers exposed to secondhand smoke.  Living with a smoker may increase a nonsmoker's chances of developing lung cancer by 20 to 30 percent.  Secondhand smoke may increase the risk of breast cancer, nasal sinus cavity cancer, cervical cancer, bladder cancer, and nose and throat (nasopharyngeal) cancer in adults.  Secondhand smoke may increase the risk of heart disease by 25 to 30 percent. Children are especially at risk from secondhand smoke exposure. Children of smokers have higher rates of:  Pneumonia.  Asthma.  Smoking.  Bronchitis.  Colds.  Chronic cough.  Ear infections.  Tonsilitis.  School absences. Research suggests that exposure to secondhand smoke may cause leukemia, lymphoma, and brain tumors in children. Babies are three  times more likely to die from sudden infant death syndrome (SIDS) if their mothers smoked during and after pregnancy. There is no safe level of exposure to secondhand smoke. Studies have shown that even low levels of exposure can be harmful. The only way to fully protect nonsmokers from secondhand smoke exposure is to completely eliminate smoking in indoor spaces. The best thing you can do for your own health and for your children's health is to stop smoking. You should stop as soon as possible. This is not easy, and you may fail several times at quitting before you get free of this addiction. Nicotine replacement therapy ( such as patches, gum, or lozenges) can help. These therapies can help you deal with the physical symptoms of withdrawal. Attending quit-smoking support groups can help you deal with the emotional issues of quitting smoking.  Even if you are not ready to quit right now, there are some simple changes you can make to reduce the effect of your smoking on your family:  Do not smoke in your home. Smoke away from your home in an open area, preferably outside.  Ask others to not smoke in your home.  Do not smoke while holding a child or when children are near.  Do not smoke in your car.  Avoid restaurants, day care centers, and other places that allow smoking. Document Released:   08/05/2004 Document Revised: 03/22/2012 Document Reviewed: 10/12/2013 ExitCare Patient Information 2015 [employer]. This information is not intended to replace advice given to you by your health care provider. Make sure you discuss any questions you have with your health care provider.  

## 2015-04-02 NOTE — Progress Notes (Signed)
Established Carotid Patient   History of Present Illness  Sarah Alvarez is a 64 y.o. female patient of Dr. Oneida Alar who is s/p retrograde left common carotid artery stenting by Dr. Ruta Hinds on 03/05/2014 for a high-grade left common carotid artery stenosis. She also has undergone right carotid endarterectomy on 07/19/2013 by Dr. Scot Dock.  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 This was done by interventional radiology.  Dr. Scot Dock saw pt on 09/25/14. At that time the patient had a 60-79% left carotid stenosis. Dr. Scot Dock recommended a follow up carotid duplex scan in 6 months. Pt had undergone a left carotid stent although this cannot be seen by duplex. Dr. Scot Dock indicated the only way to really follow that would be cerebral arteriography and Dr. Scot Dock did not think that is indicated given that she is asymptomatic and given the small risk of stroke associated with this procedure. On the right side she had a mild recurrent stenosis of 40-59%.  She denies loss of vision in one eye vs the other, denies dizziness, denies post prandial abdominal pain since SMA stenting done, denies aphasia or confusion.  She has no known history of stroke or TIA.  She was hospitalized 02/25/15 then seen in [facility] ED on 03/03/15 for syncope, severe IDA, chronic blood loss, had iron and blood transfusions. Pt states source of blood loss could not be found, but is feeling better. She no longer feels light headed since her IDA has been addressed. She is on a statin. She is on Plavix.  She denies any history of MI or kidney problems.   She denies claudication symptoms with walking, denies non healing wounds in legs or feet.   Pt Diabetic: Yes, last A1C was 8.5 in June 2016 (review of records)  Pt smoker: former smoker, quit during January, 2015 hospital admission for right CEA, but is yet exposed to 2 smokers in  her house.   Pt meds include:  Statin : Yes  ASA: no, stopped due to chronic blood loss, unknown etiology Other anticoagulants/antiplatelets: Plavix    Past Medical History  Diagnosis Date  . Staph infection   . Restless leg syndrome   . Chronic mesenteric ischemia     s/p SMA stent 06/22/13  . Coronary artery disease   . Hypertension   . Hyperlipidemia   . Heart murmur   . Asthma     "mild touch" (02/11/2015)  . Anemia   . GERD (gastroesophageal reflux disease)   . Chronic lower back pain   . Type II diabetes mellitus   . Diabetic peripheral neuropathy     "in my legs" (02/11/2015)  . Daily headache   . Arthritis     "hands" (02/25/2015)    Social History Social History  Substance Use Topics  . Smoking status: Former Smoker -- 1.00 packs/day for 20 years    Types: Cigarettes    Quit date: 06/21/2013  . Smokeless tobacco: Never Used  . Alcohol Use: No    Family History Family History  Problem Relation Age of Onset  . Diabetes Mother   . Stroke Mother   . Hyperlipidemia Mother   . Cancer Father   . Heart disease Father     PVD and  CAROTID  . Heart attack Father   . Diabetes Sister   . Hypertension Sister   . Heart disease Sister     Before age 4  . Hyperlipidemia  Sister   . Varicose Veins Sister   . Cancer Brother   . Cancer Maternal Aunt     breast cancer   . Diabetes Maternal Grandmother   . Diabetes Paternal Grandmother     Surgical History Past Surgical History  Procedure Laterality Date  . Endarterectomy Right 07/19/2013    Procedure: ENDARTERECTOMY CAROTID-RIGHT;  Surgeon: Angelia Mould, MD;  Location: [place];  Service: Vascular;  Laterality: Right;  . Insertion of retrograde carotid stent Left 03/05/2014    Procedure: 1)Cutdown of open exposure left common carotid; 2) Retrograde left common carotid stent; 3) Repair of left common carotid artery.;  Surgeon: Elam Dutch, MD;  Location: [place];  Service: Vascular;  Laterality: Left;  .  Arch aortogram N/A 02/11/2014    Procedure: ARCH AORTOGRAM;  Surgeon: Angelia Mould, MD;  Location: [facility] CATH LAB;  Service: Cardiovascular;  Laterality: N/A;  . Left heart catheterization with coronary angiogram N/A 10/03/2014    Procedure: LEFT HEART CATHETERIZATION WITH CORONARY ANGIOGRAM;  Surgeon: Burnell Blanks, MD;  Location: [facility] CATH LAB;  Service: Cardiovascular;  Laterality: N/A;  . Cataract extraction w/ intraocular lens implant Left 02/03/2015  . Tonsillectomy  1950's  . Finger fracture surgery Left 2015    "they had to rebreak it and put pins in it"  . Fracture surgery    . Abdominal hysterectomy    . Coronary angioplasty      ? LCX stent ~ 2000 in [place], [place] with LCX stent [facility] 09/1999 for reported re-instent stenosis  . Esophagogastroduodenoscopy (egd) with propofol Left 02/13/2015    Procedure: ESOPHAGOGASTRODUODENOSCOPY (EGD) WITH PROPOFOL;  Surgeon: Ronald Lobo, MD;  Location: [place];  Service: Endoscopy;  Laterality: Left;  . Colonoscopy with propofol N/A 02/14/2015    Procedure: COLONOSCOPY WITH PROPOFOL;  Surgeon: Ronald Lobo, MD;  Location: [facility] ENDOSCOPY;  Service: Endoscopy;  Laterality: N/A;  . Laparoscopic cholecystectomy    . Givens capsule study N/A 02/27/2015    Procedure: GIVENS CAPSULE STUDY;  Surgeon: Wilford Corner, MD;  Location: [facility] ENDOSCOPY;  Service: Endoscopy;  Laterality: N/A;    No Known Allergies  Current Outpatient Prescriptions  Medication Sig Dispense Refill  . acetaminophen (TYLENOL) 650 MG CR tablet Take 1 tablet (650 mg total) by mouth every 8 (eight) hours as needed for pain. 60 tablet 3  . atorvastatin (LIPITOR) 20 MG tablet Take 1 tablet (20 mg total) by mouth daily. 30 tablet 5  . benazepril (LOTENSIN) 10 MG tablet Take 1 tablet (10 mg total) by mouth daily. 30 tablet 5  . clopidogrel (PLAVIX) 75 MG tablet Take 1 tablet (75 mg total) by mouth daily with breakfast. 30 tablet 3  . cyclobenzaprine (FLEXERIL) 5 MG tablet  Take 1 tablet (5 mg total) by mouth at bedtime. 30 tablet 3  . diclofenac sodium (VOLTAREN) 1 % GEL APPLY 4 GRAMS TOPICALLY 4 TIMES DAILY. 100 g 2  . ferrous sulfate 325 (65 FE) MG tablet Take 1 tablet (325 mg total) by mouth daily with breakfast. 30 tablet 3  . gabapentin (NEURONTIN) 300 MG capsule Take 1 capsule (300 mg total) by mouth 3 (three) times daily. 90 capsule 12  . glipiZIDE (GLUCOTROL) 10 MG tablet TAKE 1 TABLET BY MOUTH 2 TIMES DAILY BEFORE A MEAL. 60 tablet 3  . glucose blood (ACCU-CHEK AVIVA) test strip 1 each by Other route 3 (three) times daily. Use as instructed 100 each 12  . Insulin Glargine (LANTUS SOLOSTAR) 100 UNIT/ML Solostar Pen Inject  65 Units into the skin at bedtime. 15 mL 3  . ketorolac (ACULAR) 0.4 % SOLN Place 1 drop into the left eye 4 (four) times daily.    . Lancets (ACCU-CHEK MULTICLIX) lancets Use as instructed 100 each 12  . loratadine (CLARITIN) 10 MG tablet Take 1 tablet (10 mg total) by mouth daily as needed for allergies or rhinitis. 30 tablet 11  . metFORMIN (GLUCOPHAGE) 1000 MG tablet Take 1 tablet (1,000 mg total) by mouth 2 (two) times daily with a meal. 60 tablet 11  . ofloxacin (OCUFLOX) 0.3 % ophthalmic solution Place 1 drop into the left eye 4 (four) times daily.    Marland Kitchen oxymetazoline (AFRIN NASAL SPRAY) 0.05 % nasal spray Place 1 spray into both nostrils 2 (two) times daily as needed for congestion. 30 mL 0  . pantoprazole (PROTONIX) 40 MG tablet Take 1 tablet (40 mg total) by mouth daily. 30 tablet 11  . prednisoLONE acetate (PRED FORTE) 1 % ophthalmic suspension Place 1 drop into the left eye 4 (four) times daily.    . sitaGLIPtin (JANUVIA) 100 MG tablet Take 1 tablet (100 mg total) by mouth daily. 30 tablet 11  . Vitamin D, Ergocalciferol, (DRISDOL) 50000 UNITS CAPS capsule Take 50,000 Units by mouth every 7 (seven) days. sunday    . [DISCONTINUED] colchicine 0.6 MG tablet Take two tab PO, then take 1 tab PO 1 hour later 6 tablet 0   No current  facility-administered medications for this visit.    Review of Systems : See HPI for pertinent positives and negatives.  Physical Examination  Filed Vitals:   04/02/15 1336 04/02/15 1339 04/02/15 1340  BP: 148/78 152/85 115/80  Pulse: 88 84 83  Temp: 97.1 F (36.2 C)    Resp: 16    Height: 5\' 3"  (1.6 m)    Weight: 184 lb (83.462 kg)    SpO2: 100%     Body mass index is 32.6 kg/(m^2).  General: WDWN obese female in NAD  GAIT: normal  Eyes: PERRLA  Pulmonary: Non-labored, CTAB, no rales, no rhonchi, & no wheezing.  Cardiac: regular rhythm, positive murmur.   VASCULAR EXAM  Carotid Bruits  Left  Right    Positive, soft  Positive, harsh   Radial pulses are 2+ palpable and equal.  LE Pulses  LEFT  RIGHT   POPLITEAL  not palpable  not palpable   POSTERIOR TIBIAL  2+ palpable  2+ palpable   DORSALIS PEDIS  ANTERIOR TIBIAL  not palpable  2+ palpable    Gastrointestinal: soft, nontender, BS WNL, no r/g, no masses palpated.  Musculoskeletal: Negative muscle atrophy/wasting. M/S 5/5 in left upper extremity, 4/5 in left lower extremity, 3/5 in right upper and lower extremities, Extremities without ischemic changes.  Neurologic: A&O X 3; Appropriate Affect, Speech is normal  CN 2-12 intact, Pain and light touch intact in extremities, Motor exam as listed above.                Non-Invasive Vascular Imaging CAROTID DUPLEX 04/02/2015   CEREBROVASCULAR DUPLEX EVALUATION     INDICATION: Carotid artery disease; evaluation status post stent placement.    PREVIOUS INTERVENTION(S): Left proximal common carotid artery stent 03/05/2014; Right carotid endarterectomy 07/19/2013    DUPLEX EXAM:     RIGHT  LEFT  Peak Systolic Velocities (cm/s) End Diastolic Velocities (cm/s) Plaque LOCATION Peak Systolic Velocities (cm/s) End Diastolic Velocities (cm/s) Plaque  130 23 - CCA PROXIMAL 236 33 HT  113 23 - CCA MID  191 32 HT  124 20 - CCA  DISTAL 256 48 HT  157 12 HT ECA 162 15 HT  121 30 HM ICA PROXIMAL 238 42 -  142 40 - ICA MID 135 38 -  98 36 - ICA DISTAL 83 30 -    N/A ICA / CCA Ratio (PSV) N/A  Antegrade Vertebral Flow abnormal  409 Brachial Systolic Pressure (mmHg) 811  Triphasic Brachial Artery Waveforms Triphasic    Peak Systolic Velocities (cm/s) End Diastolic Velocities (cm/s) Plaque STENT (  ) Peak Systolic Velocities (cm/s) End Diastolic Velocities (cm/s) Plaque     PROXIMAL 201       MID 215       DISTAL 283      Plaque Morphology:  HM = Homogeneous, HT = Heterogeneous, CP = Calcific Plaque, SP = Smooth Plaque, IP = Irregular Plaque    ADDITIONAL FINDINGS:     IMPRESSION: 1. 1 - 39% right internal carotid artery stenosis, upper end of range 2. Echogenic obstruction noted in the proximal left common carotid artery with two flow channels noted. 3. Elevated velocity noted within the left stent, (limited visualization) with increased velocity in the proximal common carotid artery. 4. 40 - 59% left internal carotid artery stenosis.    Compared to the previous exam:        Assessment: THATIANA RENBARGER is a 64 y.o. female who is s/p retrograde left common carotid artery stenting by Dr. Ruta Hinds on 03/05/2014 for a high-grade left common carotid artery stenosis. She also has undergone right carotid endarterectomy on 07/19/2013 by Dr. Scot Dock. The patient has no known history of stroke or TIA.  Today's carotid duplex suggests  I discussed with Dr. Scot Dock that pt is asymptomatic, today's HPI, today's physical exam, and today's carotid duplex results compared to March 2016, see Plan.  [name] atherosclerotic risk factors include DM, former smoker, CAD, and chronic exposure to secondhand smoke.  Plan: Follow-up in 6 months with Carotid Duplex, follow up with me on a day that Dr. Oneida Alar is in the office.   I discussed in depth with the patient the nature of atherosclerosis, and emphasized the importance of  maximal medical management including strict control of blood pressure, blood glucose, and lipid levels, obtaining regular exercise, and avoidance of secondhand smoke exposure.  The patient is aware that without maximal medical management the underlying atherosclerotic disease process will progress, limiting the benefit of any interventions. The patient was given information about stroke prevention and what symptoms should prompt the patient to seek immediate medical care. Thank you for allowing [place] to participate in this patient's care.  Clemon Chambers, RN, MSN, FNP-C [facility] Office: [phone]  Clinic Physician: Scot Dock  04/02/2015 1:42 PM

## 2015-04-08 ENCOUNTER — Encounter: Payer: Medicaid Other | Admitting: Pharmacist

## 2015-04-09 ENCOUNTER — Encounter: Payer: Medicaid Other | Admitting: Pharmacist

## 2015-04-15 ENCOUNTER — Ambulatory Visit: Payer: Medicaid Other | Attending: Family Medicine | Admitting: Family Medicine

## 2015-04-15 ENCOUNTER — Encounter (HOSPITAL_BASED_OUTPATIENT_CLINIC_OR_DEPARTMENT_OTHER): Payer: Medicaid Other | Admitting: Clinical

## 2015-04-15 ENCOUNTER — Encounter: Payer: Self-pay | Admitting: Family Medicine

## 2015-04-15 VITALS — BP 146/94 | HR 94 | Temp 98.5°F | Resp 17 | Ht 63.0 in | Wt 184.8 lb

## 2015-04-15 DIAGNOSIS — F329 Major depressive disorder, single episode, unspecified: Secondary | ICD-10-CM

## 2015-04-15 DIAGNOSIS — F418 Other specified anxiety disorders: Secondary | ICD-10-CM

## 2015-04-15 DIAGNOSIS — Z0000 Encounter for general adult medical examination without abnormal findings: Secondary | ICD-10-CM | POA: Diagnosis not present

## 2015-04-15 DIAGNOSIS — Z114 Encounter for screening for human immunodeficiency virus [HIV]: Secondary | ICD-10-CM

## 2015-04-15 DIAGNOSIS — D500 Iron deficiency anemia secondary to blood loss (chronic): Secondary | ICD-10-CM | POA: Insufficient documentation

## 2015-04-15 DIAGNOSIS — E119 Type 2 diabetes mellitus without complications: Secondary | ICD-10-CM | POA: Diagnosis not present

## 2015-04-15 DIAGNOSIS — Z1159 Encounter for screening for other viral diseases: Secondary | ICD-10-CM

## 2015-04-15 DIAGNOSIS — F32A Depression, unspecified: Secondary | ICD-10-CM

## 2015-04-15 DIAGNOSIS — F419 Anxiety disorder, unspecified: Principal | ICD-10-CM

## 2015-04-15 LAB — GLUCOSE, POCT (MANUAL RESULT ENTRY): POC Glucose: 249 mg/dl — AB (ref 70–99)

## 2015-04-15 LAB — HEPATITIS C ANTIBODY: HCV Ab: NEGATIVE

## 2015-04-15 LAB — POCT GLYCOSYLATED HEMOGLOBIN (HGB A1C): Hemoglobin A1C: 7.5

## 2015-04-15 MED ORDER — DULOXETINE HCL 30 MG PO CPEP: 30.0000 mg | ORAL_CAPSULE | Freq: Every day | ORAL | Status: DC

## 2015-04-15 NOTE — Progress Notes (Signed)
Patient states she is here for her follow up on her diabetes Patient is requesting something for her depression Patient is currently dealing with a sick sibling and has gone back to smoking And feeling a little down

## 2015-04-15 NOTE — Progress Notes (Signed)
ASSESSMENT: Pt currently experiencing symptoms of anxiety and depression. Pt needs to f/u with PCP and [facility]; would benefit from psychoeducation and supportive counseling regarding coping with symptoms of anxiety and depression, as well as community resources.  Stage of Change: precontemplative  PLAN: 1. F/U with behavioral health consultant in as needed 2. Psychiatric Medications: Cymbalta (starting today). 3. Behavioral recommendation(s):   -Consider self-care -Consider calling Hospice about palliative care services for brother  -Consider reading educational materials regarding coping with symptoms of anxiety and depression SUBJECTIVE: Pt. referred by Dr Adrian Blackwater for Symptoms of anxiety and depression:  Pt. 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 Pt says "I don't know what to do if I lose him"; they are very close. Pt states that she has not previously felt like this, but realizes that she needs help to cope emotionally with her brothers' decreasing health and his declining memory.  Duration of problem: at least one month Severity: severe  OBJECTIVE: Orientation & Cognition: Oriented x3. Thought processes normal and appropriate to situation. Mood: teary, low. Affect: appropriate Appearance: appropriate Risk of harm to self or others: no risk of harm to self or others Substance use: none Assessments administered: PHQ9: 18/ GAD7: 19  Diagnosis: Depression CPT Code: F32.9 -------------------------------------------- Other(s) present in the room: none  Time spent with patient in exam room: 16 minutes

## 2015-04-15 NOTE — Progress Notes (Signed)
Patient ID: Sarah Alvarez, female   DOB: [birth year], 64 y.o.   MRN: [id number]   Subjective:  Patient ID: Sarah Alvarez, female    DOB: May 14, 1951  Age: 64 y.o. MRN: [id number]  CC: Follow-up   HPI Sarah Alvarez presents for   1. CHRONIC DIABETES  Disease Monitoring  Blood Sugar Ranges: < 200  Polyuria: no   Visual problems: no   Medication Compliance: yes  Medication Side Effects  Hypoglycemia: no   Preventitive Health Care  Eye Exam: followed by ophthalmology s/p cataract surgery   Foot Exam: done today    2. CHRONIC HYPERTENSION  Disease Monitoring  Blood pressure range: not checking   Chest pain: no   Dyspnea: no   Claudication: no   Medication compliance: yes  Medication Side Effects  Lightheadedness: no   Urinary frequency: no   Edema: yes, in L foot comes and goes       3. Depression: patient reports depressed mood. 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 Her brother is very sick. He is at home and refusing nursing home care. He require changing, feeding, bathing. Patient worries that he will pass. She gets very little sleep due to worry. She has 3 other sisters. Her sisters help some, but patient's brother relies on her primarily.   Social History  Substance Use Topics  . Smoking status: Former Smoker -- 1.00 packs/day for 20 years    Types: Cigarettes    Quit date: 06/21/2013  . Smokeless tobacco: Never Used  . Alcohol Use: No    Outpatient Prescriptions Prior to Visit  Medication Sig Dispense Refill  . acetaminophen (TYLENOL) 650 MG CR tablet Take 1 tablet (650 mg total) by mouth every 8 (eight) hours as needed for pain. 60 tablet 3  . atorvastatin (LIPITOR) 20 MG tablet Take 1 tablet (20 mg total) by mouth daily. 30 tablet 5  . benazepril (LOTENSIN) 10 MG tablet Take 1 tablet (10 mg total) by mouth daily. 30 tablet 5  . clopidogrel (PLAVIX) 75 MG tablet Take 1 tablet (75 mg total) by mouth daily with breakfast. 30 tablet 3  . cyclobenzaprine  (FLEXERIL) 5 MG tablet Take 1 tablet (5 mg total) by mouth at bedtime. 30 tablet 3  . ferrous sulfate 325 (65 FE) MG tablet Take 1 tablet (325 mg total) by mouth daily with breakfast. 30 tablet 3  . gabapentin (NEURONTIN) 300 MG capsule Take 1 capsule (300 mg total) by mouth 3 (three) times daily. 90 capsule 12  . glipiZIDE (GLUCOTROL) 10 MG tablet TAKE 1 TABLET BY MOUTH 2 TIMES DAILY BEFORE A MEAL. 60 tablet 3  . Insulin Glargine (LANTUS SOLOSTAR) 100 UNIT/ML Solostar Pen Inject 65 Units into the skin at bedtime. 15 mL 3  . ketorolac (ACULAR) 0.4 % SOLN Place 1 drop into the left eye 4 (four) times daily.    Marland Kitchen loratadine (CLARITIN) 10 MG tablet Take 1 tablet (10 mg total) by mouth daily as needed for allergies or rhinitis. 30 tablet 11  . metFORMIN (GLUCOPHAGE) 1000 MG tablet Take 1 tablet (1,000 mg total) by mouth 2 (two) times daily with a meal. 60 tablet 11  . ofloxacin (OCUFLOX) 0.3 % ophthalmic solution Place 1 drop into the left eye 4 (four) times daily.    . pantoprazole (PROTONIX) 40 MG tablet Take 1 tablet (40 mg total) by mouth daily. 30 tablet 11  . sitaGLIPtin (JANUVIA) 100 MG tablet Take 1 tablet (100 mg total) by mouth daily.  30 tablet 11  . diclofenac sodium (VOLTAREN) 1 % GEL APPLY 4 GRAMS TOPICALLY 4 TIMES DAILY. 100 g 2  . glucose blood (ACCU-CHEK AVIVA) test strip 1 each by Other route 3 (three) times daily. Use as instructed 100 each 12  . Lancets (ACCU-CHEK MULTICLIX) lancets Use as instructed 100 each 12  . oxymetazoline (AFRIN NASAL SPRAY) 0.05 % nasal spray Place 1 spray into both nostrils 2 (two) times daily as needed for congestion. 30 mL 0  . prednisoLONE acetate (PRED FORTE) 1 % ophthalmic suspension Place 1 drop into the left eye 4 (four) times daily.    . Vitamin D, Ergocalciferol, (DRISDOL) 50000 UNITS CAPS capsule Take 50,000 Units by mouth every 7 (seven) days. sunday     No facility-administered medications prior to visit.    ROS Review of Systems    Constitutional: Negative for fever and chills.  HENT: Positive for hearing loss.   Eyes: Negative for visual disturbance.  Respiratory: Negative for shortness of breath.   Cardiovascular: Negative for chest pain.  Gastrointestinal: Negative for abdominal pain and blood in stool.  Musculoskeletal: Negative for back pain and arthralgias.  Skin: Negative for rash.  Allergic/Immunologic: Negative for immunocompromised state.  Hematological: Negative for adenopathy. Does not bruise/bleed easily.  Psychiatric/Behavioral: Positive for sleep disturbance and dysphoric mood. Negative for suicidal ideas. The patient is nervous/anxious.   GAD-7: score of 19. 2-4,5. 3-all others   Objective:  BP 146/94 mmHg  Pulse 94  Temp(Src) 98.5 F (36.9 C)  Resp 17  Ht 5\' 3"  (1.6 m)  Wt 184 lb 12.8 oz (83.825 kg)  BMI 32.74 kg/m2  SpO2 99%  BP/Weight 04/15/2015 0/53/9767 09/13/1935  Systolic BP 902 409 735  Diastolic BP 94 80 77  Wt. (Lbs) 184.8 184 181.9  BMI 32.74 32.6 32.23   Physical Exam  Constitutional: She is oriented to person, place, and time. She appears well-developed and well-nourished. No distress.  HENT:  Head: Normocephalic and atraumatic.  Right Ear: External ear normal.  Left Ear: Tympanic membrane, external ear and ear canal normal.  Cerumen in R ear   Cardiovascular: Normal rate, regular rhythm, normal heart sounds and intact distal pulses.   Pulmonary/Chest: Effort normal and breath sounds normal.  Musculoskeletal: She exhibits no edema.  Neurological: She is alert and oriented to person, place, and time.  Skin: Skin is warm and dry. No rash noted.  Psychiatric: She exhibits a depressed mood.   Depression screen [facility] 2/9 04/15/2015 03/03/2015 03/20/2014 06/12/2013  Decreased Interest 2 0 0 0  Down, Depressed, Hopeless 3 0 0 0  PHQ - 2 Score 5 0 0 0  Altered sleeping 2 - - -  Tired, decreased energy 3 - - -  Change in appetite 2 - - -  Feeling bad or failure about yourself  2 -  - -  Trouble concentrating 2 - - -  Moving slowly or fidgety/restless 2 - - -  Suicidal thoughts 0 - - -  PHQ-9 Score 18 - - -     Lab Results  Component Value Date   HGBA1C 7.50 04/15/2015   CBG  249 Assessment & Plan:   Problem List Items Addressed This Visit    Depression   Relevant Medications   DULoxetine (CYMBALTA) 30 MG capsule   Iron deficiency anemia due to chronic blood loss (Chronic)   Relevant Orders   CBC    Other Visit Diagnoses    Type 2 diabetes mellitus without complication, without long-term  current use of insulin (HCC)    -  Primary    Relevant Orders    Glucose (CBG) (Completed)    HgB A1c (Completed)    Screening for HIV (human immunodeficiency virus)        Relevant Orders    HIV antibody (with reflex)    Need for hepatitis C screening test        Relevant Orders    Hepatitis C antibody, reflex    Healthcare maintenance        Relevant Orders    Flu Vaccine QUAD 36+ mos PF IM (Fluarix & Fluzone Quad PF) (Completed)    MM DIGITAL SCREENING BILATERAL       No orders of the defined types were placed in this encounter.    Follow-up: No Follow-up on file.   Boykin Nearing MD

## 2015-04-15 NOTE — Patient Instructions (Addendum)
Sarah Alvarez was seen today for follow-up.  Diagnoses and all orders for this visit:  Type 2 diabetes mellitus without complication, without long-term current use of insulin (HCC) -     Glucose (CBG) -     HgB A1c  Screening for HIV (human immunodeficiency virus) -     HIV antibody (with reflex)  Need for hepatitis C screening test -     Hepatitis C antibody, reflex  Healthcare maintenance -     Flu Vaccine QUAD 36+ mos PF IM (Fluarix & Fluzone Quad PF) -     MM DIGITAL SCREENING BILATERAL; Future  Depression -     DULoxetine (CYMBALTA) 30 MG capsule; Take 1 capsule (30 mg total) by mouth daily.  Iron deficiency anemia due to chronic blood loss -     CBC   F/u in 1 month for depression  Dr. Adrian Blackwater

## 2015-04-16 ENCOUNTER — Other Ambulatory Visit: Payer: Self-pay | Admitting: Internal Medicine

## 2015-04-16 LAB — CBC
HEMATOCRIT: 35.1 % — AB (ref 36.0–46.0)
Hemoglobin: 11.9 g/dL — ABNORMAL LOW (ref 12.0–15.0)
MCH: 29.3 pg (ref 26.0–34.0)
MCHC: 33.9 g/dL (ref 30.0–36.0)
MCV: 86.5 fL (ref 78.0–100.0)
MPV: 9.4 fL (ref 8.6–12.4)
PLATELETS: 258 10*3/uL (ref 150–400)
RBC: 4.06 MIL/uL (ref 3.87–5.11)
RDW: 23.3 % — AB (ref 11.5–15.5)
WBC: 6.7 10*3/uL (ref 4.0–10.5)

## 2015-04-16 LAB — HIV ANTIBODY (ROUTINE TESTING W REFLEX): HIV: NONREACTIVE

## 2015-05-04 IMAGING — CT CT ANGIO NECK
2 of 8 series · 9 of 33 positions shown · IV contrast (omnipaque)
Comparison: None

CLINICAL DATA: Right carotid stenosis.  Abnormal carotid Doppler.

EXAM:
CT ANGIOGRAPHY NECK
TECHNIQUE: Multidetector CT imaging of the neck was performed using the
standard protocol during bolus administration of intravenous
contrast. Multiplanar CT image reconstructions including MIPs were
obtained to evaluate the vascular anatomy. Carotid stenosis
measurements (when applicable) are obtained utilizing NASCET
criteria, using the distal internal carotid diameter as the
denominator.
CONTRAST:  50mL OMNIPAQUE IOHEXOL 350 MG/ML SOLN

[Series 4: carotid · axial · 0.35mm/px · z∈[+34,+298]mm · 3 of 133 slices shown]
[im 1/133  soft-tissue]
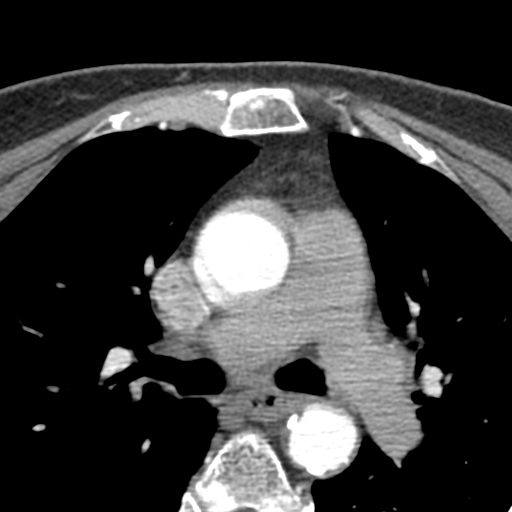
[im 67/133  bone]
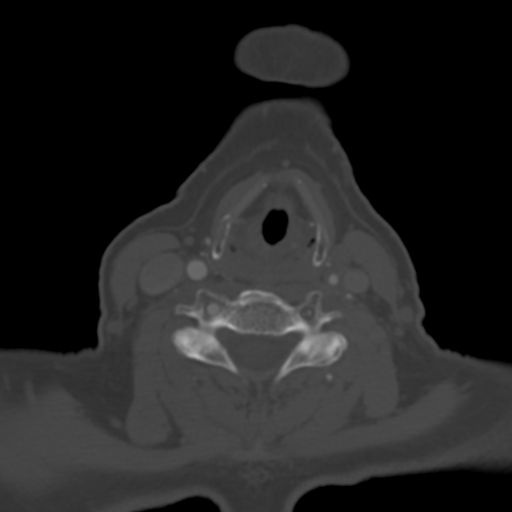
[im 133/133  soft-tissue]
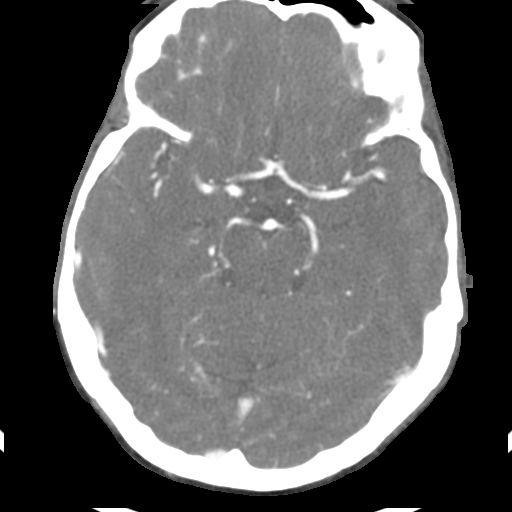

[mpr, ax 1x1 mpr, axial · axial · 0.35mm/px · z∈[+88,+262]mm · 6 of 244 slices shown]
[im 35/244  soft-tissue]
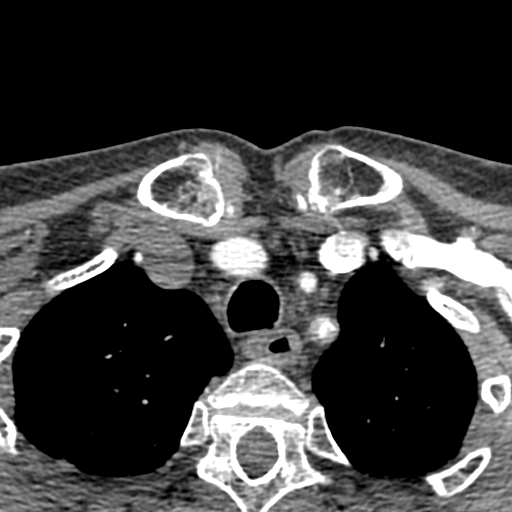
[im 70/244  soft-tissue]
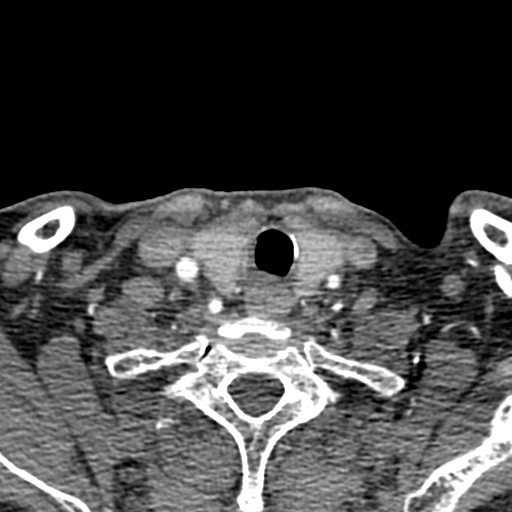
[im 105/244  soft-tissue]
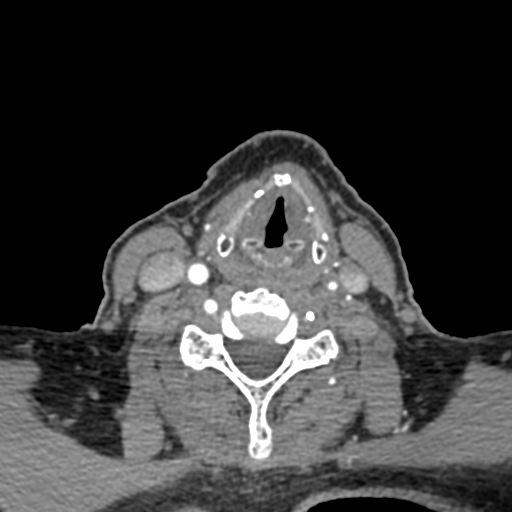
[im 139/244  soft-tissue]
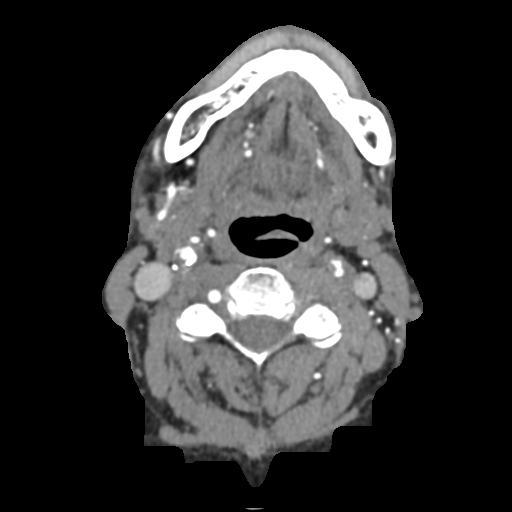
[im 174/244  soft-tissue]
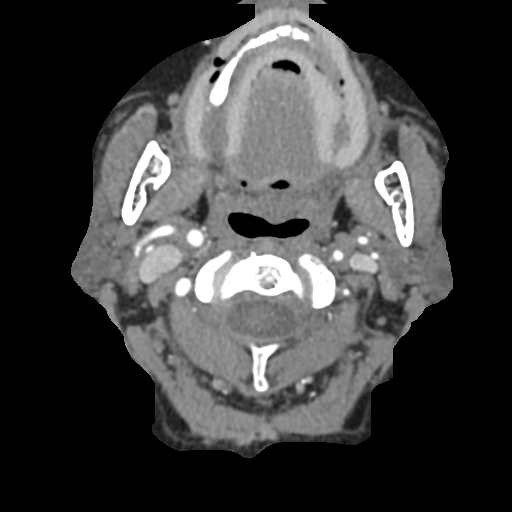
[im 209/244  soft-tissue]
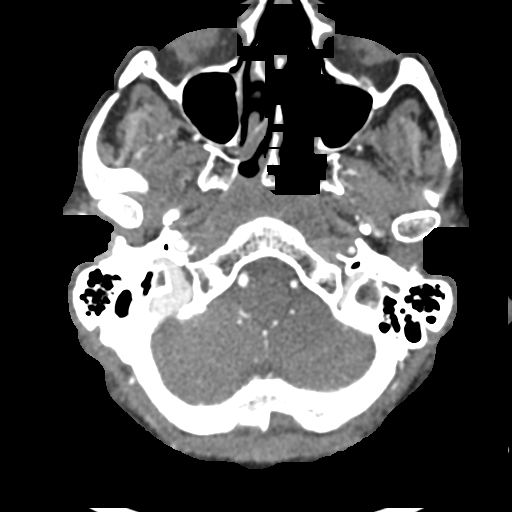

[9 of 33 positions shown; findings below may reference images not displayed]

FINDINGS: Severe diffuse atherosclerotic disease. There is atherosclerotic
disease in the aortic arch. Three vessel aortic arch. The innominate
artery has atherosclerotic disease without significant stenosis.
There is a severe stenosis at the origin of the left common carotid
artery narrowed by approximately 90% diameter stenosis. There is
diffuse disease throughout the left common carotid artery.
Atherosclerotic disease in the left proximal subclavian artery with
calcified plaque. This narrows the lumen by approximately 75%
diameter stenosis.

Right carotid artery: Common carotid artery is patent on the right.
There is complex calcified plaque at the right carotid bifurcation
extending into the right internal and external carotid arteries.
Multiple areas of stenosis in the internal carotid artery measuring
up to 80% diameter stenosis. 50% diameter stenosis proximal right
external carotid artery. Right internal carotid artery is patent to
the skullbase.

Left carotid artery: 90% stenosis at the origin of the left common
carotid artery. Diffuse disease in the common carotid artery which
is small in caliber due to low flow. Calcified and noncalcified
plaque at the left carotid bifurcation narrowing the lumen by
approximately 40% diameter stenosis. Small caliber left internal
carotid artery due to proximal flow limiting stenosis. This causes
thickening of the internal carotid artery wall. There is diffuse
atherosclerotic disease in the cervical internal carotid artery.

Right vertebral: Dominant right vertebral artery which is widely
patent without significant stenosis.

Left vertebral: Severely disease left vertebral artery with multiple
areas of critical stenosis and occlusion and reconstitution. Distal
left vertebral artery is patent and contributes to the basilar.

Review of the MIP images confirms the above findings.
IMPRESSION: Severe diffuse atherosclerotic disease.

80% diameter stenosis of the right internal carotid artery.

90% diameter stenosis at the origin of the left common carotid
artery just above the aortic arch. There is diffuse disease in the
left common carotid artery and left internal carotid artery.
Calcified and noncalcified plaque of the carotid bifurcation with
40% diameter stenosis of the proximal left internal carotid artery.

Right vertebral artery widely patent

Small left vertebral artery which is diffusely diseased with
multiple segmental areas of occlusion and reconstitution.

## 2015-05-27 ENCOUNTER — Ambulatory Visit: Payer: Medicaid Other | Attending: Family Medicine | Admitting: Family Medicine

## 2015-05-27 ENCOUNTER — Encounter: Payer: Self-pay | Admitting: Family Medicine

## 2015-05-27 VITALS — BP 116/80 | HR 98 | Temp 98.5°F | Resp 16 | Ht 63.0 in | Wt 181.0 lb

## 2015-05-27 DIAGNOSIS — E1165 Type 2 diabetes mellitus with hyperglycemia: Secondary | ICD-10-CM | POA: Insufficient documentation

## 2015-05-27 DIAGNOSIS — E1151 Type 2 diabetes mellitus with diabetic peripheral angiopathy without gangrene: Secondary | ICD-10-CM | POA: Diagnosis not present

## 2015-05-27 DIAGNOSIS — F32A Depression, unspecified: Secondary | ICD-10-CM

## 2015-05-27 DIAGNOSIS — IMO0002 Reserved for concepts with insufficient information to code with codable children: Secondary | ICD-10-CM

## 2015-05-27 DIAGNOSIS — F329 Major depressive disorder, single episode, unspecified: Secondary | ICD-10-CM | POA: Diagnosis not present

## 2015-05-27 LAB — POCT URINALYSIS DIPSTICK
Bilirubin, UA: NEGATIVE
Glucose, UA: 500
Ketones, UA: NEGATIVE
LEUKOCYTES UA: NEGATIVE
Nitrite, UA: NEGATIVE
PROTEIN UA: 30
Spec Grav, UA: 1.02
UROBILINOGEN UA: 0.2
pH, UA: 6

## 2015-05-27 LAB — GLUCOSE, POCT (MANUAL RESULT ENTRY)
POC GLUCOSE: 339 mg/dL — AB (ref 70–99)
POC Glucose: 322 mg/dl — AB (ref 70–99)

## 2015-05-27 MED ORDER — DULOXETINE HCL 30 MG PO CPEP: 30.0000 mg | ORAL_CAPSULE | Freq: Every day | ORAL | Status: AC

## 2015-05-27 MED ORDER — SITAGLIPTIN PHOS-METFORMIN HCL 50-1000 MG PO TABS: 1.0000 | ORAL_TABLET | Freq: Every day | ORAL | Status: AC

## 2015-05-27 MED ORDER — INSULIN GLARGINE 100 UNIT/ML SOLOSTAR PEN: 65.0000 [IU] | PEN_INJECTOR | Freq: Every day | SUBCUTANEOUS | Status: AC

## 2015-05-27 MED ORDER — INSULIN ASPART 100 UNIT/ML ~~LOC~~ SOLN
10.0000 [IU] | Freq: Once | SUBCUTANEOUS | Status: AC
  Administered 2015-05-27: 10 [IU] via SUBCUTANEOUS

## 2015-05-27 NOTE — Patient Instructions (Addendum)
Sarah Alvarez was seen today for depression.  Diagnoses and all orders for this visit:  DM (diabetes mellitus) type II uncontrolled, periph vascular disorder (HCC) -     POCT glucose (manual entry) -     POCT urinalysis dipstick -     insulin aspart (novoLOG) injection 10 Units; Inject 0.1 mLs (10 Units total) into the skin once. -     sitaGLIPtin-metformin (JANUMET) 50-1000 MG tablet; Take 1 tablet by mouth daily. -     Insulin Glargine (LANTUS SOLOSTAR) 100 UNIT/ML Solostar Pen; Inject 65 Units into the skin at bedtime.  Depression -     DULoxetine (CYMBALTA) 30 MG capsule; Take 1 capsule (30 mg total) by mouth daily.  decrease janumet to once daily for now  Increase lantus to 65 U nightly  increase janumet back to twice daily once diarrhea improves If sugars are still high on twice daily janumet, then increase lantus to 70 U  F/u in 2 months for diabetes, A1c check if sugar is   Dr. Adrian Blackwater

## 2015-05-27 NOTE — Progress Notes (Signed)
Subjective:  Patient ID: Sarah Alvarez, female    DOB: 21-Mar-1951  Age: 64 y.o. MRN: [id number]  CC: Depression   HPI KAIZLEE KOOPMANS presents for   1. Depression:  2. CHRONIC DIABETES  Disease Monitoring  Blood Sugar Ranges: 78-201   Polyuria: no   Visual problems: no   Medication Compliance: yes BID janumet 50-1000 is causing diarrhea, taking 60 U of lantus at night  Medication Side Effects  Hypoglycemia: no, nothing < 70   [place] Exam: done in August s/p cataract surgery   Foot Exam: done today, R small toe throbs at night. No lesions.   Diet pattern: low carb, no changes   Exercise: raking and walking dogs    Social History  Substance Use Topics  . Smoking status: Former Smoker -- 1.00 packs/day for 20 years    Types: Cigarettes    Quit date: 06/21/2013  . Smokeless tobacco: Never Used  . Alcohol Use: No    Outpatient Prescriptions Prior to Visit  Medication Sig Dispense Refill  . acetaminophen (TYLENOL) 650 MG CR tablet Take 1 tablet (650 mg total) by mouth every 8 (eight) hours as needed for pain. 60 tablet 3  . atorvastatin (LIPITOR) 20 MG tablet Take 1 tablet (20 mg total) by mouth daily. 30 tablet 5  . benazepril (LOTENSIN) 10 MG tablet Take 1 tablet (10 mg total) by mouth daily. 30 tablet 5  . clopidogrel (PLAVIX) 75 MG tablet Take 1 tablet (75 mg total) by mouth daily with breakfast. 30 tablet 3  . cyclobenzaprine (FLEXERIL) 5 MG tablet Take 1 tablet (5 mg total) by mouth at bedtime. 30 tablet 3  . diclofenac sodium (VOLTAREN) 1 % GEL APPLY 4 GRAMS TOPICALLY 4 TIMES DAILY. 100 g 2  . ferrous sulfate 325 (65 FE) MG tablet Take 1 tablet (325 mg total) by mouth daily with breakfast. 30 tablet 3  . gabapentin (NEURONTIN) 300 MG capsule Take 1 capsule (300 mg total) by mouth 3 (three) times daily. 90 capsule 12  . glipiZIDE (GLUCOTROL) 10 MG tablet TAKE 1 TABLET BY MOUTH 2 TIMES DAILY BEFORE A MEAL. 60 tablet 3  . glucose blood  (ACCU-CHEK AVIVA) test strip 1 each by Other route 3 (three) times daily. Use as instructed 100 each 12  . Lancets (ACCU-CHEK MULTICLIX) lancets Use as instructed 100 each 12  . loratadine (CLARITIN) 10 MG tablet Take 1 tablet (10 mg total) by mouth daily as needed for allergies or rhinitis. 30 tablet 11  . oxymetazoline (AFRIN NASAL SPRAY) 0.05 % nasal spray Place 1 spray into both nostrils 2 (two) times daily as needed for congestion. 30 mL 0  . pantoprazole (PROTONIX) 40 MG tablet Take 1 tablet (40 mg total) by mouth daily. 30 tablet 11  . Vitamin D, Ergocalciferol, (DRISDOL) 50000 UNITS CAPS capsule Take 50,000 Units by mouth every 7 (seven) days. sunday    . DULoxetine (CYMBALTA) 30 MG capsule Take 1 capsule (30 mg total) by mouth daily. 30 capsule 1  . Insulin Glargine (LANTUS SOLOSTAR) 100 UNIT/ML Solostar Pen Inject 65 Units into the skin at bedtime. 15 mL 3  . sitaGLIPtin (JANUVIA) 100 MG tablet Take 1 tablet (100 mg total) by mouth daily. 30 tablet 11  . ketorolac (ACULAR) 0.4 % SOLN Place 1 drop into the left eye 4 (four) times daily.    . metFORMIN (GLUCOPHAGE) 1000 MG tablet Take 1 tablet (1,000 mg total) by mouth 2 (two) times daily  with a meal. (Patient not taking: Reported on 05/27/2015) 60 tablet 11  . ofloxacin (OCUFLOX) 0.3 % ophthalmic solution Place 1 drop into the left eye 4 (four) times daily.    . prednisoLONE acetate (PRED FORTE) 1 % ophthalmic suspension Place 1 drop into the left eye 4 (four) times daily.     No facility-administered medications prior to visit.    ROS Review of Systems  Constitutional: Negative for fever and chills.  HENT: Positive for hearing loss.   Eyes: Negative for visual disturbance.  Respiratory: Negative for shortness of breath.   Cardiovascular: Negative for chest pain.  Gastrointestinal: Negative for abdominal pain and blood in stool.  Musculoskeletal: Negative for back pain and arthralgias.  Skin: Negative for rash.    Allergic/Immunologic: Negative for immunocompromised state.  Hematological: Negative for adenopathy. Does not bruise/bleed easily.  Psychiatric/Behavioral: Positive for sleep disturbance and dysphoric mood. Negative for suicidal ideas. The patient is nervous/anxious.     Objective:  BP 116/80 mmHg  Pulse 98  Temp(Src) 98.5 F (36.9 C) (Oral)  Resp 16  Ht 5\' 3"  (1.6 m)  Wt 181 lb (82.101 kg)  BMI 32.07 kg/m2  SpO2 99%  BP/Weight 05/27/2015 04/15/2015 [id number]  Systolic BP [id number]  Diastolic BP 80 94 80  Wt. (Lbs) 181 184.8 184  BMI 32.07 32.74 32.6    Physical Exam  Constitutional: She is oriented to person, place, and time. She appears well-developed and well-nourished. No distress.  HENT:  Head: Normocephalic and atraumatic.  Right Ear: External ear normal.  Left Ear: Tympanic membrane, external ear and ear canal normal.  Cerumen in R ear   Cardiovascular: Normal rate, regular rhythm, normal heart sounds and intact distal pulses.   Pulmonary/Chest: Effort normal and breath sounds normal.  Musculoskeletal: She exhibits no edema.  Neurological: She is alert and oriented to person, place, and time.  Skin: Skin is warm and dry. No rash noted.  Psychiatric: She exhibits a depressed mood.   Lab Results  Component Value Date   HGBA1C 7.50 04/15/2015  CBG 322 UA: negative for ketones   Treated with 10 U of novolog  Repeat CBG 339  Depression screen [facility] 2/9 05/27/2015 04/15/2015 03/03/2015  Decreased Interest 2 2 0  Down, Depressed, Hopeless 1 3 0  PHQ - 2 Score 3 5 0  Altered sleeping 1 2 -  Tired, decreased energy 1 3 -  Change in appetite 1 2 -  Feeling bad or failure about yourself  0 2 -  Trouble concentrating 0 2 -  Moving slowly or fidgety/restless 0 2 -  Suicidal thoughts 0 0 -  PHQ-9 Score 6 18 -   GAD 7 : Generalized Anxiety Score 05/27/2015  Nervous, Anxious, on Edge 0  Control/stop worrying 1  Worry too much - different things 1  Trouble  relaxing 1  Restless 0  Easily annoyed or irritable 1  Afraid - awful might happen 1  Total GAD 7 Score 5      Assessment & Plan:   Problem List Items Addressed This Visit    Depression   Relevant Medications   DULoxetine (CYMBALTA) 30 MG capsule   DM (diabetes mellitus) type II uncontrolled, periph vascular disorder (HCC) - Primary (Chronic)   Relevant Medications   insulin aspart (novoLOG) injection 10 Units (Completed)   sitaGLIPtin-metformin (JANUMET) 50-1000 MG tablet   Insulin Glargine (LANTUS SOLOSTAR) 100 UNIT/ML Solostar Pen   Other Relevant Orders   POCT glucose (manual entry) (Completed)  POCT urinalysis dipstick (Completed)      No orders of the defined types were placed in this encounter.    Follow-up: No Follow-up on file.   Boykin Nearing MD

## 2015-05-27 NOTE — Progress Notes (Signed)
F/U Depression  Elevated glucose at OV today Stated took medication at 730 and had eggs for breakfast No suicide thought in the past two weeks No Tobacco user

## 2015-05-27 NOTE — Assessment & Plan Note (Signed)
A: diabetes with recent hyperglycemia. Diarrhea on BID janumet P: decrease janumet to once daily for now  Increase lantus to 65 U  increase janumet back to twice daily once diarrhea improves  If sugars are still high on twice daily janumet, then increase lantus to 70 U  F/u in 2 months for diabetes

## 2015-05-29 ENCOUNTER — Ambulatory Visit (INDEPENDENT_AMBULATORY_CARE_PROVIDER_SITE_OTHER): Payer: Medicaid Other | Admitting: Internal Medicine

## 2015-05-29 ENCOUNTER — Encounter: Payer: Self-pay | Admitting: Internal Medicine

## 2015-05-29 VITALS — BP 150/60 | HR 93 | Ht 63.0 in | Wt 183.0 lb

## 2015-05-29 DIAGNOSIS — I10 Essential (primary) hypertension: Secondary | ICD-10-CM

## 2015-05-29 LAB — CBC
HCT: 38.3 % (ref 36.0–46.0)
Hemoglobin: 12.9 g/dL (ref 12.0–15.0)
MCH: 31.5 pg (ref 26.0–34.0)
MCHC: 33.7 g/dL (ref 30.0–36.0)
MCV: 93.4 fL (ref 78.0–100.0)
MPV: 9.8 fL (ref 8.6–12.4)
Platelets: 229 10*3/uL (ref 150–400)
RBC: 4.1 MIL/uL (ref 3.87–5.11)
RDW: 15.2 % (ref 11.5–15.5)
WBC: 5.9 10*3/uL (ref 4.0–10.5)

## 2015-05-29 LAB — LIPID PANEL
CHOLESTEROL: 134 mg/dL (ref 125–200)
HDL: 45 mg/dL — AB (ref >=46)
LDL CALC: 76 mg/dL (ref <130)
TRIGLYCERIDES: 63 mg/dL (ref <150)
Total CHOL/HDL Ratio: 3 Ratio (ref <=5.0)
VLDL: 13 mg/dL (ref <30)

## 2015-05-29 LAB — BASIC METABOLIC PANEL
BUN: 14 mg/dL (ref 7–25)
CALCIUM: 9.2 mg/dL (ref 8.6–10.4)
CO2: 28 mmol/L (ref 20–31)
CREATININE: 0.61 mg/dL (ref 0.50–0.99)
Chloride: 104 mmol/L (ref 98–110)
Glucose, Bld: 267 mg/dL — ABNORMAL HIGH (ref 65–99)
Potassium: 4.3 mmol/L (ref 3.5–5.3)
Sodium: 139 mmol/L (ref 135–146)

## 2015-05-29 NOTE — Progress Notes (Signed)
Cardiology Office Note   Date:  05/29/2015   ID:  Sarah Alvarez, DOB 18-Feb-1951, MRN [id number]  PCP:  Minerva Ends, MD  Cardiologist:   Dorris Carnes, MD   No chief complaint on file.  F/U of CAD    History of Present Illness: Sarah Alvarez is a 64 y.o. female with a history of history of CAD  Stents placed in [facility] and [place] ([place])  I saw her in March  At that time she complained of SOB  Stress test abnormal  Recomm L heart cath  This showed mild nonobstructicve CAD of RCA and LAD  Mild restenosis in LCx stent  Normal R sided pressures.   She was admitted in Aug with Anemia, bleeding   Transfused   EGD neg  Colon with small polyp  Did capsule endoscope Supposedly OK  Feeling OK  Breathing OK  NO CP   No blood in stool On plavix only now.     Current Outpatient Prescriptions  Medication Sig Dispense Refill  . acetaminophen (TYLENOL) 650 MG CR tablet Take 1 tablet (650 mg total) by mouth every 8 (eight) hours as needed for pain. 60 tablet 3  . atorvastatin (LIPITOR) 20 MG tablet Take 1 tablet (20 mg total) by mouth daily. 30 tablet 5  . benazepril (LOTENSIN) 10 MG tablet Take 1 tablet (10 mg total) by mouth daily. 30 tablet 5  . clopidogrel (PLAVIX) 75 MG tablet Take 1 tablet (75 mg total) by mouth daily with breakfast. 30 tablet 3  . cyclobenzaprine (FLEXERIL) 5 MG tablet Take 1 tablet (5 mg total) by mouth at bedtime. 30 tablet 3  . diclofenac sodium (VOLTAREN) 1 % GEL APPLY 4 GRAMS TOPICALLY 4 TIMES DAILY. 100 g 2  . DULoxetine (CYMBALTA) 30 MG capsule Take 1 capsule (30 mg total) by mouth daily. 30 capsule 5  . ferrous sulfate 325 (65 FE) MG tablet Take 1 tablet (325 mg total) by mouth daily with breakfast. 30 tablet 3  . gabapentin (NEURONTIN) 300 MG capsule Take 1 capsule (300 mg total) by mouth 3 (three) times daily. 90 capsule 12  . glipiZIDE (GLUCOTROL) 10 MG tablet TAKE 1 TABLET BY MOUTH 2 TIMES DAILY BEFORE A MEAL. 60 tablet 3  . glucose blood  (ACCU-CHEK AVIVA) test strip 1 each by Other route 3 (three) times daily. Use as instructed 100 each 12  . Insulin Glargine (LANTUS SOLOSTAR) 100 UNIT/ML Solostar Pen Inject 65 Units into the skin at bedtime. 15 mL 3  . Lancets (ACCU-CHEK MULTICLIX) lancets Use as instructed 100 each 12  . loratadine (CLARITIN) 10 MG tablet Take 1 tablet (10 mg total) by mouth daily as needed for allergies or rhinitis. 30 tablet 11  . oxymetazoline (AFRIN NASAL SPRAY) 0.05 % nasal spray Place 1 spray into both nostrils 2 (two) times daily as needed for congestion. 30 mL 0  . pantoprazole (PROTONIX) 40 MG tablet Take 1 tablet (40 mg total) by mouth daily. 30 tablet 11  . sitaGLIPtin-metformin (JANUMET) 50-1000 MG tablet Take 1 tablet by mouth daily. 30 tablet 2  . Vitamin D, Ergocalciferol, (DRISDOL) 50000 UNITS CAPS capsule Take 50,000 Units by mouth every 7 (seven) days. sunday    . [DISCONTINUED] colchicine 0.6 MG tablet Take two tab PO, then take 1 tab PO 1 hour later 6 tablet 0   No current facility-administered medications for this visit.    Allergies:   Review of patient's allergies indicates no known allergies.  Past Medical History  Diagnosis Date  . Staph infection   . Restless leg syndrome   . Chronic mesenteric ischemia (HCC)     s/p SMA stent 06/22/13  . Coronary artery disease   . Hypertension   . Hyperlipidemia   . Heart murmur   . Asthma     "mild touch" (02/11/2015)  . Anemia   . GERD (gastroesophageal reflux disease)   . Chronic lower back pain   . Type II diabetes mellitus ([place])   . Diabetic peripheral neuropathy ([place])     "in my legs" (02/11/2015)  . Daily headache   . Arthritis     "hands" (02/25/2015)    Past Surgical History  Procedure Laterality Date  . Endarterectomy Right 07/19/2013    Procedure: ENDARTERECTOMY CAROTID-RIGHT;  Surgeon: Angelia Mould, MD;  Location: [place];  Service: Vascular;  Laterality: Right;  . Insertion of retrograde carotid stent Left  03/05/2014    Procedure: 1)Cutdown of open exposure left common carotid; 2) Retrograde left common carotid stent; 3) Repair of left common carotid artery.;  Surgeon: Elam Dutch, MD;  Location: [place];  Service: Vascular;  Laterality: Left;  . Arch aortogram N/A 02/11/2014    Procedure: ARCH AORTOGRAM;  Surgeon: Angelia Mould, MD;  Location: [facility] CATH LAB;  Service: Cardiovascular;  Laterality: N/A;  . Left heart catheterization with coronary angiogram N/A 10/03/2014    Procedure: LEFT HEART CATHETERIZATION WITH CORONARY ANGIOGRAM;  Surgeon: Burnell Blanks, MD;  Location: [facility] CATH LAB;  Service: Cardiovascular;  Laterality: N/A;  . Cataract extraction w/ intraocular lens implant Left 02/03/2015  . Tonsillectomy  1950's  . Finger fracture surgery Left 2015    "they had to rebreak it and put pins in it"  . Fracture surgery    . Abdominal hysterectomy    . Coronary angioplasty      ? LCX stent ~ 2000 in [place] with LCX stent [facility] 09/1999 for reported re-instent stenosis  . Esophagogastroduodenoscopy (egd) with propofol Left 02/13/2015    Procedure: ESOPHAGOGASTRODUODENOSCOPY (EGD) WITH PROPOFOL;  Surgeon: Ronald Lobo, MD;  Location: [place];  Service: Endoscopy;  Laterality: Left;  . Colonoscopy with propofol N/A 02/14/2015    Procedure: COLONOSCOPY WITH PROPOFOL;  Surgeon: Ronald Lobo, MD;  Location[facility] ENDOSCOPY;  Service: Endoscopy;  Laterality: N/A;  . Laparoscopic cholecystectomy    . Givens capsule study N/A 02/27/2015    Procedure: GIVENS CAPSULE STUDY;  Surgeon: Wilford Corner, MD;  Location: [facility] ENDOSCOPY;  Service: Endoscopy;  Laterality: N/A;     Social History:  The patient  reports that she quit smoking about 23 months ago. Her smoking use included Cigarettes. She has a 20 pack-year smoking history. She has never used smokeless tobacco. She reports that she does not drink alcohol or use illicit drugs.   Family History:  The patient's family history includes  Cancer in her brother, father, and maternal aunt; Deep vein thrombosis in her father; Diabetes in her maternal grandmother, mother, paternal grandmother, and sister; Heart attack in her father and mother; Heart disease in her father, mother, and sister; Hyperlipidemia in her father, mother, and sister; Hypertension in her father, mother, and sister; Stroke in her mother; Varicose Veins in her sister.    ROS:  Please see the history of present illness. All other systems are reviewed and  Negative to the above problem except as noted.    PHYSICAL EXAM: VS:  BP 150/60 mmHg  Pulse 93  Ht 5'  3" (1.6 m)  Wt 83.008 kg (183 lb)  BMI 32.43 kg/m2  SpO2 97%  GEN: Well nourished, well developed, in no acute distress HEENT: normal Neck: no JVD  + bruits  or masses Cardiac: RRR; no murmurs, rubs, or gallops,no edema  Respiratory:  clear to auscultation bilaterally, normal work of breathing GI: soft, nontender, nondistended, + BS  No hepatomegaly  MS: no deformity Moving all extremities   Skin: warm and dry, no rash Neuro:  Strength and sensation are intact Psych: euthymic mood, full affect   EKG:  EKG is not ordered today.   Lipid Panel    Component Value Date/Time   CHOL 166 09/16/2014 1311   TRIG 112.0 09/16/2014 1311   HDL 52.10 09/16/2014 1311   CHOLHDL 3 09/16/2014 1311   VLDL 22.4 09/16/2014 1311   LDLCALC 92 09/16/2014 1311      Wt Readings from Last 3 Encounters:  05/29/15 83.008 kg (183 lb)  05/27/15 82.101 kg (181 lb)  04/15/15 83.825 kg (184 lb 12.8 oz)      ASSESSMENT AND PLAN:   1  CAD No symptoms of angina  Keep on same regien  F/U CBC  2.  GI  Check CBC    3.  CV disease  asymptomatic    4.  HL  Continue on lipitor  5  Tobacco Counselled    F?U in May   Signed, Dorris Carnes, MD  05/29/2015 9:06 AM    [facility] [address], [place], [place]  [address] Phone: [phone]; Fax: [phone]

## 2015-05-29 NOTE — Patient Instructions (Signed)
Your physician recommends that you continue on your current medications as directed. Please refer to the Current Medication list given to you today.  Your physician recommends that you return for lab work in: BMET, CBC, [place] wants you to follow-up in: [place].  You will receive a reminder letter in the mail two months in advance. If you don't receive a letter, please call our office to schedule the follow-up appointment.

## 2015-07-17 ENCOUNTER — Other Ambulatory Visit: Payer: Self-pay | Admitting: Radiology

## 2015-07-17 ENCOUNTER — Other Ambulatory Visit (HOSPITAL_COMMUNITY): Payer: Self-pay | Admitting: Interventional Radiology

## 2015-07-17 DIAGNOSIS — K551 Chronic vascular disorders of intestine: Secondary | ICD-10-CM

## 2015-07-17 DIAGNOSIS — K559 Vascular disorder of intestine, unspecified: Secondary | ICD-10-CM

## 2015-07-21 ENCOUNTER — Other Ambulatory Visit (HOSPITAL_COMMUNITY): Payer: Self-pay | Admitting: Interventional Radiology

## 2015-08-01 LAB — CREATININE WITH EST GFR: Creat: 0.69 mg/dL (ref 0.50–0.99)

## 2015-08-01 LAB — BUN: BUN: 9 mg/dL (ref 7–25)

## 2015-08-06 ENCOUNTER — Encounter (HOSPITAL_COMMUNITY): Payer: Self-pay

## 2015-08-06 ENCOUNTER — Ambulatory Visit (HOSPITAL_COMMUNITY)
Admission: RE | Admit: 2015-08-06 | Discharge: 2015-08-06 | Disposition: A | Discharge status: Home / Self Care | Payer: Medicaid Other | Source: Ambulatory Visit | Attending: Interventional Radiology | Admitting: Interventional Radiology

## 2015-08-06 ENCOUNTER — Ambulatory Visit
Admission: RE | Admit: 2015-08-06 | Discharge: 2015-08-06 | Disposition: A | Discharge status: Home / Self Care | Payer: Medicaid Other | Source: Ambulatory Visit | Attending: Interventional Radiology | Admitting: Interventional Radiology

## 2015-08-06 DIAGNOSIS — K559 Vascular disorder of intestine, unspecified: Secondary | ICD-10-CM

## 2015-08-06 DIAGNOSIS — I700 Atherosclerosis of aorta: Secondary | ICD-10-CM | POA: Insufficient documentation

## 2015-08-06 DIAGNOSIS — Z9689 Presence of other specified functional implants: Secondary | ICD-10-CM | POA: Diagnosis present

## 2015-08-06 MED ORDER — IOHEXOL 350 MG/ML SOLN
100.0000 mL | Freq: Once | INTRAVENOUS | Status: AC | PRN
  Administered 2015-08-06: 200 mL via INTRAVENOUS

## 2015-08-06 NOTE — Progress Notes (Signed)
Patient ID: Sarah Alvarez, female   DOB: 09-Dec-1950, 65 y.o.   MRN: [id number]       Chief Complaint: Chronic mesenteric vascular occlusive disease, status post stent re vascularization of the celiac and SMA for intestinal angina. Outpatient follow-up. Referring Physician(s): Bora Broner  History of Present Illness: Sarah Alvarez is a 65 y.o. 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 She also has a history of coronary and carotid disease. She underwent successful stent revascularization of the celiac and SMA origins for intestinal angina and weight loss. Since the 2 interventions, she has been stable. She remains on Plavix. No recurrent abdominal pelvic pain. No significant intestinal or abdominal angina associate with meals. Stable diet and appetite. She actually has gained weight over the last year. No associated nausea or vomiting. Stable bowel habits. She remains at her baseline.  Past Medical History  Diagnosis Date  . Staph infection   . Restless leg syndrome   . Chronic mesenteric ischemia (HCC)     s/p SMA stent 06/22/13  . Coronary artery disease   . Hypertension   . Hyperlipidemia   . Heart murmur   . Asthma     "mild touch" (02/11/2015)  . Anemia   . GERD (gastroesophageal reflux disease)   . Chronic lower back pain   . Type II diabetes mellitus ([place])   . Diabetic peripheral neuropathy ([place])     "in my legs" (02/11/2015)  . Daily headache   . Arthritis     "hands" (02/25/2015)    Past Surgical History  Procedure Laterality Date  . Endarterectomy Right 07/19/2013    Procedure: ENDARTERECTOMY CAROTID-RIGHT;  Surgeon: Angelia Mould, MD;  Location: [place];  Service: Vascular;  Laterality: Right;  . Insertion of retrograde carotid stent Left 03/05/2014    Procedure: 1)Cutdown of open exposure left common carotid; 2) Retrograde left common carotid stent; 3) Repair of left common carotid artery.;  Surgeon: Elam Dutch, MD;   Location: [place];  Service: Vascular;  Laterality: Left;  . Arch aortogram N/A 02/11/2014    Procedure: ARCH AORTOGRAM;  Surgeon: Angelia Mould, MD;  Location: [facility] CATH LAB;  Service: Cardiovascular;  Laterality: N/A;  . Left heart catheterization with coronary angiogram N/A 10/03/2014    Procedure: LEFT HEART CATHETERIZATION WITH CORONARY ANGIOGRAM;  Surgeon: Burnell Blanks, MD;  Location[facility] CATH LAB;  Service: Cardiovascular;  Laterality: N/A;  . Cataract extraction w/ intraocular lens implant Left 02/03/2015  . Tonsillectomy  1950's  . Finger fracture surgery Left 2015    "they had to rebreak it and put pins in it"  . Fracture surgery    . Abdominal hysterectomy    . Coronary angioplasty      ? LCX stent ~ 2000 in [place] with LCX stent [facility] 09/1999 for reported re-instent stenosis  . Esophagogastroduodenoscopy (egd) with propofol Left 02/13/2015    Procedure: ESOPHAGOGASTRODUODENOSCOPY (EGD) WITH PROPOFOL;  Surgeon: Ronald Lobo, MD;  Location: [place];  Service: Endoscopy;  Laterality: Left;  . Colonoscopy with propofol N/A 02/14/2015    Procedure: COLONOSCOPY WITH PROPOFOL;  Surgeon: Ronald Lobo, MD;  Location: [facility] ENDOSCOPY;  Service: Endoscopy;  Laterality: N/A;  . Laparoscopic cholecystectomy    . Givens capsule study N/A 02/27/2015    Procedure: GIVENS CAPSULE STUDY;  Surgeon: Wilford Corner, MD;  Location: [facility] ENDOSCOPY;  Service: Endoscopy;  Laterality: N/A;    Allergies: Review of patient's allergies indicates no known allergies.  Medications: Prior to  Admission medications   Medication Sig Start Date End Date Taking? Authorizing Provider  acetaminophen (TYLENOL) 650 MG CR tablet Take 1 tablet (650 mg total) by mouth every 8 (eight) hours as needed for pain. 03/27/15  Yes Josalyn Funches, MD  atorvastatin (LIPITOR) 20 MG tablet Take 1 tablet (20 mg total) by mouth daily. 03/27/15  Yes Josalyn Funches, MD  benazepril (LOTENSIN) 10 MG tablet Take 1 tablet  (10 mg total) by mouth daily. 03/27/15  Yes Boykin Nearing, MD  clopidogrel (PLAVIX) 75 MG tablet Take 1 tablet (75 mg total) by mouth daily with breakfast. 03/27/15  Yes Josalyn Funches, MD  cyclobenzaprine (FLEXERIL) 5 MG tablet Take 1 tablet (5 mg total) by mouth at bedtime. 03/27/15  Yes Josalyn Funches, MD  diclofenac sodium (VOLTAREN) 1 % GEL APPLY 4 GRAMS TOPICALLY 4 TIMES DAILY. 03/27/15  Yes Josalyn Funches, MD  DULoxetine (CYMBALTA) 30 MG capsule Take 1 capsule (30 mg total) by mouth daily. 05/27/15  Yes Josalyn Funches, MD  famotidine (PEPCID) 20 MG tablet TAKE 1 TABLET BY MOUTH 2 TIMES DAILY. 06/17/15  Yes Tresa Garter, MD  ferrous sulfate 325 (65 FE) MG tablet Take 1 tablet (325 mg total) by mouth daily with breakfast. 03/27/15  Yes Josalyn Funches, MD  gabapentin (NEURONTIN) 300 MG capsule Take 1 capsule (300 mg total) by mouth 3 (three) times daily. 03/27/15  Yes Josalyn Funches, MD  glipiZIDE (GLUCOTROL) 10 MG tablet TAKE 1 TABLET BY MOUTH 2 TIMES DAILY BEFORE A MEAL. 03/03/15  Yes Josalyn Funches, MD  glucose blood (ACCU-CHEK AVIVA) test strip 1 each by Other route 3 (three) times daily. Use as instructed 03/03/15  Yes Josalyn Funches, MD  Insulin Glargine (LANTUS SOLOSTAR) 100 UNIT/ML Solostar Pen Inject 65 Units into the skin at bedtime. 05/27/15  Yes Boykin Nearing, MD  Lancets (ACCU-CHEK MULTICLIX) lancets Use as instructed 06/19/14  Yes Deepak Advani, MD  loratadine (CLARITIN) 10 MG tablet Take 1 tablet (10 mg total) by mouth daily as needed for allergies or rhinitis. 03/27/15  Yes Josalyn Funches, MD  oxymetazoline (AFRIN NASAL SPRAY) 0.05 % nasal spray Place 1 spray into both nostrils 2 (two) times daily as needed for congestion. 01/22/14  Yes Deepak Advani, MD  pantoprazole (PROTONIX) 40 MG tablet Take 1 tablet (40 mg total) by mouth daily. 03/27/15  Yes Josalyn Funches, MD  sitaGLIPtin-metformin (JANUMET) 50-1000 MG tablet Take 1 tablet by mouth daily. 05/27/15  Yes Josalyn  Funches, MD  Vitamin D, Ergocalciferol, (DRISDOL) 50000 UNITS CAPS capsule Take 50,000 Units by mouth every 7 (seven) days. sunday   Yes Historical Provider, MD  glipiZIDE (GLUCOTROL) 10 MG tablet TAKE 1 TABLET BY MOUTH 2 TIMES DAILY BEFORE A MEAL. 06/17/15   Tresa Garter, MD     Family History  Problem Relation Age of Onset  . Diabetes Mother   . Stroke Mother   . Hyperlipidemia Mother   . Heart disease Mother   . Hypertension Mother   . Heart attack Mother   . Cancer Father   . Heart disease Father     PVD and  CAROTID  . Heart attack Father   . Deep vein thrombosis Father   . Hyperlipidemia Father   . Hypertension Father   . Diabetes Sister   . Hypertension Sister   . Heart disease Sister     Before age 16  . Hyperlipidemia Sister   . Varicose Veins Sister   . Cancer Brother   . Cancer Maternal Aunt  breast cancer   . Diabetes Maternal Grandmother   . Diabetes Paternal Grandmother     Social History   Social History  . Marital Status: Divorced    Spouse Name: N/A  . Number of Children: N/A  . Years of Education: N/A   Social History Main Topics  . Smoking status: Former Smoker -- 1.00 packs/day for 20 years    Types: Cigarettes    Quit date: 06/21/2013  . Smokeless tobacco: Never Used  . Alcohol Use: No  . Drug Use: No  . Sexual Activity: No   Other Topics Concern  . Not on file   Social History Narrative     Review of Systems: A 12 point ROS discussed and pertinent positives are indicated in the HPI above.  All other systems are negative.  Review of Systems  Constitutional: Negative for fever, activity change, appetite change, fatigue and unexpected weight change.  Respiratory: Negative for shortness of breath.   Cardiovascular: Negative for chest pain.  Gastrointestinal: Negative for abdominal pain.    Vital Signs: BP 119/58 mmHg  Pulse 86  Temp(Src) 98.2 F (36.8 C) (Oral)  Resp 14  Ht 5\' 3"  (1.6 m)  Wt 176 lb (79.833 kg)  BMI  31.18 kg/m2  SpO2 100%  Physical Exam  Constitutional: She appears well-developed and well-nourished. No distress.  Neck:  No carotid bruit.  Cardiovascular: Normal rate and regular rhythm.  Exam reveals no friction rub.   No murmur heard. Pulmonary/Chest: Effort normal and breath sounds normal. No respiratory distress.  Abdominal: Soft. Bowel sounds are normal. She exhibits no distension and no mass. There is no guarding.  Skin: Skin is warm and dry. She is not diaphoretic. No erythema. No pallor.  Psychiatric: She has a normal mood and affect. Her behavior is normal.    Imaging: Ct Angio Abd/pel W/ And/or W/o  08/06/2015  CLINICAL DATA:  Mesenteric ischemia and status post SMA and celiac stent placement. EXAM: CT ANGIOGRAPHY ABDOMEN AND PELVIS TECHNIQUE: Multidetector CT imaging of the abdomen and pelvis was performed using the standard protocol during bolus administration of intravenous contrast. Multiplanar reconstructed images including MIPs were obtained and reviewed to evaluate the vascular anatomy. CONTRAST:  200 mL Omnipaque 350 COMPARISON:  02/28/2015 and 07/23/1958 2016 FINDINGS: ARTERIAL FINDINGS: Aorta: Scattered atherosclerotic disease and plaque throughout the abdominal aorta without aneurysm or dissection. Again noted is circumferential plaque in the infrarenal abdominal aorta with mild narrowing, best seen on sequence 4, image 68. The degree of aortic stenosis is unchanged. Celiac axis: The celiac trunk stent is in a stable position. This stent is patent but there appears to be a small amount of intraluminal plaque in the mid aspect. The splenic artery, left gastric artery and common hepatic artery remain patent. Superior mesenteric: Again noted are probably 3 stents in the proximal SMA that are patent. Stable position of the proximal SMA stent that extends just into the abdominal aortic lumen. Difficult to exclude a small amount of intra stent stenosis and plaque just beyond the  origin. SMA and main branch vessels are patent. Left renal: Calcified plaque in the proximal left renal artery with at least mild stenosis. Right renal: Calcified plaque in the proximal right renal artery with at least mild stenosis. Inferior mesenteric: Inferior mesenteric artery and main branch vessels are patent. Left iliac: Atherosclerotic plaque in the left iliac arteries without significant stenosis. The proximal left femoral arteries are patent with mild plaque. Right iliac: Mild narrowing in the proximal right  common iliac artery with a moderate amount of plaque. At least mild stenosis at the origin of the right internal iliac artery. Right external iliac artery and proximal right femoral arteries are patent with mild-moderate plaque. Venous findings: Main portal venous system is patent. Bilateral renal veins are patent. Review of the MIP images confirms the above findings. NONVASCULAR FINDINGS: Small amount of scarring or atelectasis along along the medial right lung base. No pleural effusions. Evidence for coronary artery calcifications and mitral annular calcifications. No acute abnormality to the liver and the gallbladder has been removed. Normal appearance of the gallbladder and pancreas. Again noted is fullness in the left adrenal gland suggestive for hyperplasia. Normal appearance of the right adrenal gland. Normal appearance of both kidneys. Uterus has been removed. Normal appearance of the urinary bladder. No acute abnormality involving the stomach, small bowel or colon. Normal appearance of the appendix. No evidence for bowel wall thickening or inflammation. No significant free fluid or lymphadenopathy. Evidence for bilateral ovarian tissue without gross abnormality. There is a small ventral hernia below the umbilicus. No acute bone abnormality. IMPRESSION: The celiac and SMA stents are patent. Difficult to exclude a small amount of intra stent plaque and stenosis, particularly in the celiac artery  stent. Unclear if these are hemodynamically significant. Diffuse atherosclerotic disease involving the aorta and visceral arteries. Negative for dissection or aneurysm. No acute abnormality in the abdomen or pelvis. No evidence for acute bowel inflammation. Electronically Signed   By: Markus Daft M.D.   On: 08/06/2015 09:30    Labs:  CBC:  Recent Labs  03/03/15 1032 03/03/15 1451 04/15/15 1122 05/29/15 0938  WBC 6.0 6.5 6.7 5.9  HGB 10.3* 10.3* 11.9* 12.9  HCT 33.1* 33.1* 35.1* 38.3  PLT 316 305 258 229    COAGS:  Recent Labs  10/02/14 0847 02/11/15 1857  INR 1.0 1.07    BMP:  Recent Labs  02/25/15 1800 02/26/15 0530 03/03/15 1451 05/29/15 0938 07/31/15 1409  NA 136 137 141 139  --   K 4.4 4.3 4.2 4.3  --   CL 107 105 109 104  --   CO2 22 24 23 28   --   GLUCOSE 320* 201* 95 267*  --   BUN 14 15 13 14 9   CALCIUM 8.4* 8.5* 9.4 9.2  --   CREATININE 0.75 0.59 0.84 0.61 0.69  GFRNONAA >60 >60 >60  --  >89  GFRAA >60 >60 >60  --  >89    LIVER FUNCTION TESTS:  Recent Labs  02/12/15 0615 02/25/15 1249 02/25/15 1800 03/03/15 1451  BILITOT 0.4 0.4 0.4 0.3  AST 15 16 17 25   ALT 10* 12* 11* 13*  ALKPHOS 66 64 64 74  PROT 5.4* 5.5* 5.1* 6.1*  ALBUMIN 3.2* 3.4* 3.3* 3.8    TUMOR MARKERS: No results for input(s): AFPTM, CEA, CA199, CHROMGRNA in the last 8760 hours.  Assessment and Plan:  Known chronic mesenteric vascular disease. Status post celiac and SMA stents for intestinal angina. Stable CTA compared to one year ago. No recurrent abdominal pain, intestinal angina symptoms, diet changes, or change in appetite. Stable weight and bowel habits.  Plan: Continue Plavix antiplatelet therapy. Outpatient follow-up in 12 months with a repeat CTA abdomen only or earlier if any symptoms recur.  Electronically Signed: Greggory Keen 08/06/2015, 12:58 PM   I spent a total of    25 Minutes in face to face in clinical consultation, greater than 50% of which was  counseling/coordinating care  for this patient with mesenteric vascular disease.

## 2015-08-07 ENCOUNTER — Other Ambulatory Visit: Payer: Self-pay | Admitting: Family Medicine

## 2015-08-07 ENCOUNTER — Other Ambulatory Visit: Payer: Self-pay | Admitting: Internal Medicine

## 2015-08-07 MED FILL — JANUMET 50-1,000 MG TABLET: 50-1000 | 30 days supply | Qty: 30 | Fill #0

## 2015-08-07 MED FILL — LANTUS SOLOSTAR 100 UNITS/M: 100 | 30 days supply | Qty: 15 | Fill #1

## 2015-08-07 MED FILL — DULoxetine HCL 30 MG CPEP: 30 | 30 days supply | Qty: 30 | Fill #2

## 2015-08-07 MED FILL — PANTOPRAZOLE SOD DR 40 MG T: 40 | 30 days supply | Qty: 30 | Fill #4

## 2015-08-07 MED FILL — GABAPENTIN 300 MG CAPSULE: 300 | 30 days supply | Qty: 90 | Fill #4

## 2015-08-07 MED FILL — BENAZEPRIL HCL 10 MG TABLET: 10 | 30 days supply | Qty: 30 | Fill #2

## 2015-08-07 MED FILL — ATORVASTATIN 20 MG TABLET: 20 | 30 days supply | Qty: 30 | Fill #3

## 2015-08-07 MED FILL — ACCU-CHEK AVIVA PLUS TEST S: 25 days supply | Qty: 100 | Fill #6

## 2015-08-08 MED FILL — CYCLOBENZAPRINE 5 MG TABLET: 5 | 30 days supply | Qty: 30 | Fill #0

## 2015-08-08 MED FILL — CLOPIDOGREL 75 MG TABLET: 75 | 30 days supply | Qty: 30 | Fill #0

## 2015-08-08 MED FILL — glipiZIDE 10 MG TABS: 10 | 30 days supply | Qty: 60 | Fill #0

## 2015-08-08 MED FILL — LORATADINE 10 MG TABLET: 10 | 30 days supply | Qty: 30 | Fill #3

## 2015-08-08 MED FILL — VOLTAREN 1% GEL: 1 | 25 days supply | Qty: 100 | Fill #0

## 2015-08-19 ENCOUNTER — Encounter: Payer: Self-pay | Admitting: Cardiology

## 2015-09-02 MED FILL — ATORVASTATIN 20 MG TABLET: 20 | 30 days supply | Qty: 30 | Fill #4

## 2015-09-02 MED FILL — BENAZEPRIL HCL 10 MG TABLET: 10 | 30 days supply | Qty: 30 | Fill #3

## 2015-09-02 MED FILL — DULoxetine HCL 30 MG CPEP: 30 | 30 days supply | Qty: 30 | Fill #3

## 2015-09-02 MED FILL — CLOPIDOGREL 75 MG TABLET: 75 | 30 days supply | Qty: 30 | Fill #1

## 2015-09-02 MED FILL — CYCLOBENZAPRINE 5 MG TABLET: 5 | 30 days supply | Qty: 30 | Fill #1

## 2015-09-02 MED FILL — LORATADINE 10 MG TABLET: 10 | 30 days supply | Qty: 30 | Fill #4

## 2015-09-02 MED FILL — GABAPENTIN 300 MG CAPSULE: 300 | 30 days supply | Qty: 90 | Fill #5

## 2015-09-02 MED FILL — LANTUS SOLOSTAR 100 UNITS/M: 100 | 30 days supply | Qty: 15 | Fill #2

## 2015-09-02 MED FILL — ACCU-CHEK AVIVA PLUS TEST S: 25 days supply | Qty: 100 | Fill #7

## 2015-09-02 MED FILL — PANTOPRAZOLE SOD DR 40 MG T: 40 | 30 days supply | Qty: 30 | Fill #5

## 2015-09-02 MED FILL — glipiZIDE 10 MG TABS: 10 | 30 days supply | Qty: 60 | Fill #1

## 2015-09-22 ENCOUNTER — Other Ambulatory Visit: Payer: Self-pay | Admitting: Family Medicine

## 2015-09-22 DIAGNOSIS — Z1231 Encounter for screening mammogram for malignant neoplasm of breast: Secondary | ICD-10-CM

## 2015-09-24 ENCOUNTER — Encounter: Payer: Self-pay | Admitting: Family

## 2015-09-30 ENCOUNTER — Other Ambulatory Visit: Payer: Self-pay | Admitting: Internal Medicine

## 2015-09-30 MED FILL — DULoxetine HCL 30 MG CPEP: 30 | 30 days supply | Qty: 30 | Fill #4

## 2015-09-30 MED FILL — CLOPIDOGREL 75 MG TABLET: 75 | 30 days supply | Qty: 30 | Fill #2

## 2015-09-30 MED FILL — CYCLOBENZAPRINE 5 MG TABLET: 5 | 30 days supply | Qty: 30 | Fill #2

## 2015-09-30 MED FILL — glipiZIDE 10 MG TABS: 10 | 30 days supply | Qty: 60 | Fill #2

## 2015-09-30 MED FILL — ATORVASTATIN 20 MG TABLET: 20 | 30 days supply | Qty: 30 | Fill #5

## 2015-09-30 MED FILL — LORATADINE 10 MG TABLET: 10 | 30 days supply | Qty: 30 | Fill #5

## 2015-09-30 MED FILL — ACCU-CHEK AVIVA PLUS TEST S: 25 days supply | Qty: 100 | Fill #8

## 2015-09-30 MED FILL — LANTUS SOLOSTAR 100 UNITS/M: 100 | 30 days supply | Qty: 15 | Fill #3

## 2015-09-30 MED FILL — BENAZEPRIL HCL 10 MG TABLET: 10 | 30 days supply | Qty: 30 | Fill #4

## 2015-09-30 MED FILL — VOLTAREN 1% GEL: 1 | 6 days supply | Qty: 100 | Fill #2

## 2015-09-30 MED FILL — GABAPENTIN 300 MG CAPSULE: 300 | 30 days supply | Qty: 90 | Fill #6

## 2015-09-30 MED FILL — PANTOPRAZOLE SOD DR 40 MG T: 40 | 30 days supply | Qty: 30 | Fill #6

## 2015-10-01 MED FILL — JANUMET 50-1,000 MG TABLET: 50-1000 | 30 days supply | Qty: 30 | Fill #1

## 2015-10-02 ENCOUNTER — Encounter (HOSPITAL_COMMUNITY): Payer: Medicaid Other

## 2015-10-02 ENCOUNTER — Ambulatory Visit
Admission: RE | Admit: 2015-10-02 | Discharge: 2015-10-02 | Disposition: A | Discharge status: Home / Self Care | Payer: Medicaid Other | Source: Ambulatory Visit | Attending: Family Medicine | Admitting: Family Medicine

## 2015-10-02 ENCOUNTER — Ambulatory Visit: Payer: Medicaid Other | Admitting: Family

## 2015-10-02 DIAGNOSIS — Z1231 Encounter for screening mammogram for malignant neoplasm of breast: Secondary | ICD-10-CM

## 2015-10-20 ENCOUNTER — Emergency Department (HOSPITAL_COMMUNITY)
Admission: EM | Admit: 2015-10-20 | Discharge: 2015-10-20 | Disposition: A | Discharge status: Home / Self Care | Payer: Medicaid Other | Attending: Emergency Medicine | Admitting: Emergency Medicine

## 2015-10-20 ENCOUNTER — Emergency Department (HOSPITAL_COMMUNITY): Payer: Medicaid Other

## 2015-10-20 DIAGNOSIS — R011 Cardiac murmur, unspecified: Secondary | ICD-10-CM | POA: Insufficient documentation

## 2015-10-20 DIAGNOSIS — R112 Nausea with vomiting, unspecified: Secondary | ICD-10-CM | POA: Diagnosis not present

## 2015-10-20 DIAGNOSIS — E1142 Type 2 diabetes mellitus with diabetic polyneuropathy: Secondary | ICD-10-CM | POA: Diagnosis not present

## 2015-10-20 DIAGNOSIS — R197 Diarrhea, unspecified: Secondary | ICD-10-CM | POA: Diagnosis present

## 2015-10-20 DIAGNOSIS — G8929 Other chronic pain: Secondary | ICD-10-CM | POA: Insufficient documentation

## 2015-10-20 DIAGNOSIS — I2510 Atherosclerotic heart disease of native coronary artery without angina pectoris: Secondary | ICD-10-CM | POA: Diagnosis not present

## 2015-10-20 DIAGNOSIS — M19042 Primary osteoarthritis, left hand: Secondary | ICD-10-CM | POA: Insufficient documentation

## 2015-10-20 DIAGNOSIS — Z87891 Personal history of nicotine dependence: Secondary | ICD-10-CM | POA: Diagnosis not present

## 2015-10-20 DIAGNOSIS — G2581 Restless legs syndrome: Secondary | ICD-10-CM | POA: Insufficient documentation

## 2015-10-20 DIAGNOSIS — Z7984 Long term (current) use of oral hypoglycemic drugs: Secondary | ICD-10-CM | POA: Insufficient documentation

## 2015-10-20 DIAGNOSIS — E785 Hyperlipidemia, unspecified: Secondary | ICD-10-CM | POA: Diagnosis not present

## 2015-10-20 DIAGNOSIS — R350 Frequency of micturition: Secondary | ICD-10-CM | POA: Diagnosis not present

## 2015-10-20 DIAGNOSIS — R1084 Generalized abdominal pain: Secondary | ICD-10-CM | POA: Diagnosis not present

## 2015-10-20 DIAGNOSIS — Z90710 Acquired absence of both cervix and uterus: Secondary | ICD-10-CM | POA: Diagnosis not present

## 2015-10-20 DIAGNOSIS — Z7902 Long term (current) use of antithrombotics/antiplatelets: Secondary | ICD-10-CM | POA: Diagnosis not present

## 2015-10-20 DIAGNOSIS — M19041 Primary osteoarthritis, right hand: Secondary | ICD-10-CM | POA: Insufficient documentation

## 2015-10-20 DIAGNOSIS — Z9889 Other specified postprocedural states: Secondary | ICD-10-CM | POA: Insufficient documentation

## 2015-10-20 DIAGNOSIS — R109 Unspecified abdominal pain: Secondary | ICD-10-CM

## 2015-10-20 DIAGNOSIS — Z9861 Coronary angioplasty status: Secondary | ICD-10-CM | POA: Diagnosis not present

## 2015-10-20 DIAGNOSIS — I10 Essential (primary) hypertension: Secondary | ICD-10-CM | POA: Insufficient documentation

## 2015-10-20 DIAGNOSIS — K219 Gastro-esophageal reflux disease without esophagitis: Secondary | ICD-10-CM | POA: Insufficient documentation

## 2015-10-20 DIAGNOSIS — D649 Anemia, unspecified: Secondary | ICD-10-CM | POA: Diagnosis not present

## 2015-10-20 LAB — URINALYSIS, ROUTINE W REFLEX MICROSCOPIC
Bilirubin Urine: NEGATIVE
Ketones, ur: 40 mg/dL — AB
NITRITE: NEGATIVE
PH: 5.5 (ref 5.0–8.0)
Protein, ur: 30 mg/dL — AB
SPECIFIC GRAVITY, URINE: 1.021 (ref 1.005–1.030)

## 2015-10-20 LAB — COMPREHENSIVE METABOLIC PANEL
ALBUMIN: 4 g/dL (ref 3.5–5.0)
ALT: 16 U/L (ref 14–54)
ANION GAP: 14 (ref 5–15)
AST: 22 U/L (ref 15–41)
Alkaline Phosphatase: 88 U/L (ref 38–126)
BILIRUBIN TOTAL: 1.2 mg/dL (ref 0.3–1.2)
BUN: 16 mg/dL (ref 6–20)
CHLORIDE: 98 mmol/L — AB (ref 101–111)
CO2: 20 mmol/L — ABNORMAL LOW (ref 22–32)
Calcium: 9 mg/dL (ref 8.9–10.3)
Creatinine, Ser: 0.76 mg/dL (ref 0.44–1.00)
GFR calc Af Amer: >60 mL/min (ref >60)
GFR calc non Af Amer: >60 mL/min (ref >60)
GLUCOSE: 323 mg/dL — AB (ref 65–99)
POTASSIUM: 4.3 mmol/L (ref 3.5–5.1)
SODIUM: 132 mmol/L — AB (ref 135–145)
TOTAL PROTEIN: 6.8 g/dL (ref 6.5–8.1)

## 2015-10-20 LAB — CBC
HEMATOCRIT: 30.5 % — AB (ref 36.0–46.0)
HEMOGLOBIN: 10.1 g/dL — AB (ref 12.0–15.0)
MCH: 28.8 pg (ref 26.0–34.0)
MCHC: 33.1 g/dL (ref 30.0–36.0)
MCV: 86.9 fL (ref 78.0–100.0)
Platelets: 389 10*3/uL (ref 150–400)
RBC: 3.51 MIL/uL — ABNORMAL LOW (ref 3.87–5.11)
RDW: 14.5 % (ref 11.5–15.5)
WBC: 17.5 10*3/uL — AB (ref 4.0–10.5)

## 2015-10-20 LAB — I-STAT CG4 LACTIC ACID, ED: LACTIC ACID, VENOUS: 1.17 mmol/L (ref 0.5–2.0)

## 2015-10-20 LAB — URINE MICROSCOPIC-ADD ON

## 2015-10-20 LAB — CBG MONITORING, ED
Glucose-Capillary: 314 mg/dL — ABNORMAL HIGH (ref 65–99)
Glucose-Capillary: 301 mg/dL — ABNORMAL HIGH (ref 65–99)

## 2015-10-20 LAB — LIPASE, BLOOD: LIPASE: 27 U/L (ref 11–51)

## 2015-10-20 MED ORDER — HYDROMORPHONE HCL 1 MG/ML IJ SOLN
0.5000 mg | Freq: Once | INTRAMUSCULAR | Status: AC
  Administered 2015-10-20: 0.5 mg via INTRAVENOUS
  Filled 2015-10-20: qty 1

## 2015-10-20 MED ORDER — ONDANSETRON 4 MG PO TBDP: 4.0000 mg | ORAL_TABLET | Freq: Three times a day (TID) | ORAL | Status: AC | PRN

## 2015-10-20 MED ORDER — ONDANSETRON HCL 4 MG/2ML IJ SOLN
4.0000 mg | INTRAMUSCULAR | Status: AC
  Administered 2015-10-20: 4 mg via INTRAVENOUS
  Filled 2015-10-20: qty 2

## 2015-10-20 MED ORDER — METOCLOPRAMIDE HCL 5 MG/ML IJ SOLN
10.0000 mg | Freq: Once | INTRAMUSCULAR | Status: AC
  Administered 2015-10-20: 10 mg via INTRAVENOUS
  Filled 2015-10-20: qty 2

## 2015-10-20 MED ORDER — CEPHALEXIN 500 MG PO CAPS: 500.0000 mg | ORAL_CAPSULE | Freq: Two times a day (BID) | ORAL | Status: AC

## 2015-10-20 MED ORDER — SODIUM CHLORIDE 0.9 % IV BOLUS (SEPSIS)
500.0000 mL | Freq: Once | INTRAVENOUS | Status: AC
  Administered 2015-10-20: 500 mL via INTRAVENOUS

## 2015-10-20 MED ORDER — SODIUM CHLORIDE 0.9 % IV BOLUS (SEPSIS)
1000.0000 mL | Freq: Once | INTRAVENOUS | Status: AC
  Administered 2015-10-20: 1000 mL via INTRAVENOUS

## 2015-10-20 MED ORDER — IOPAMIDOL (ISOVUE-370) INJECTION 76%
100.0000 mL | Freq: Once | INTRAVENOUS | Status: AC | PRN
  Administered 2015-10-20: 100 mL via INTRAVENOUS

## 2015-10-20 NOTE — ED Provider Notes (Signed)
CSN: [id number]     Arrival date & time 10/20/15  1808 History   First MD Initiated Contact with Patient 10/20/15 [place]     Chief Complaint  Patient presents with  . Emesis  . Abdominal Pain    HPI   Sarah Alvarez is a 65 y.o. female with a PMH of chronic mesenteric ischemia, HTN, HLD, DM, CAD who presents to the ED with nausea, vomiting, and diarrhea, which she states has been present since Saturday. She also states she has experienced diffuse lower abdominal pain. She reports she is unable to tolerate PO intake due to her symptoms. She notes she has not had an episode of diarrhea since last night. She denies fever, chills, chest pain, shortness of breath, hematemesis, hematochezia, melena, dysuria, urgency. She reports urinary frequency.   Past Medical History  Diagnosis Date  . Staph infection   . Restless leg syndrome   . Chronic mesenteric ischemia (HCC)     s/p SMA stent 06/22/13  . Coronary artery disease   . Hypertension   . Hyperlipidemia   . Heart murmur   . Asthma     "mild touch" (02/11/2015)  . Anemia   . GERD (gastroesophageal reflux disease)   . Chronic lower back pain   . Type II diabetes mellitus ([place])   . Diabetic peripheral neuropathy ([place])     "in my legs" (02/11/2015)  . Daily headache   . Arthritis     "hands" (02/25/2015)   Past Surgical History  Procedure Laterality Date  . Endarterectomy Right 07/19/2013    Procedure: ENDARTERECTOMY CAROTID-RIGHT;  Surgeon: Angelia Mould, MD;  Location: [facility];  Service: Vascular;  Laterality: Right;  . Insertion of retrograde carotid stent Left 03/05/2014    Procedure: 1)Cutdown of open exposure left common carotid; 2) Retrograde left common carotid stent; 3) Repair of left common carotid artery.;  Surgeon: Elam Dutch, MD;  Location: [place];  Service: Vascular;  Laterality: Left;  . Arch aortogram N/A 02/11/2014    Procedure: ARCH AORTOGRAM;  Surgeon: Angelia Mould, MD;  Location: [facility] CATH LAB;  Service:  Cardiovascular;  Laterality: N/A;  . Left heart catheterization with coronary angiogram N/A 10/03/2014    Procedure: LEFT HEART CATHETERIZATION WITH CORONARY ANGIOGRAM;  Surgeon: Burnell Blanks, MD;  Location: [facility] CATH LAB;  Service: Cardiovascular;  Laterality: N/A;  . Cataract extraction w/ intraocular lens implant Left 02/03/2015  . Tonsillectomy  1950's  . Finger fracture surgery Left 2015    "they had to rebreak it and put pins in it"  . Fracture surgery    . Abdominal hysterectomy    . Coronary angioplasty      ? LCX stent ~ 2000 in [place], [place] with LCX stent [facility] 09/1999 for reported re-instent stenosis  . Esophagogastroduodenoscopy (egd) with propofol Left 02/13/2015    Procedure: ESOPHAGOGASTRODUODENOSCOPY (EGD) WITH PROPOFOL;  Surgeon: Ronald Lobo, MD;  Location: [place];  Service: Endoscopy;  Laterality: Left;  . Colonoscopy with propofol N/A 02/14/2015    Procedure: COLONOSCOPY WITH PROPOFOL;  Surgeon: Ronald Lobo, MD;  Location: [facility] ENDOSCOPY;  Service: Endoscopy;  Laterality: N/A;  . Laparoscopic cholecystectomy    . Givens capsule study N/A 02/27/2015    Procedure: GIVENS CAPSULE STUDY;  Surgeon: Wilford Corner, MD;  Location: [facility] ENDOSCOPY;  Service: Endoscopy;  Laterality: N/A;   Family History  Problem Relation Age of Onset  . Diabetes Mother   . Stroke Mother   . Hyperlipidemia Mother   .  Heart disease Mother   . Hypertension Mother   . Heart attack Mother   . Cancer Father   . Heart disease Father     PVD and  CAROTID  . Heart attack Father   . Deep vein thrombosis Father   . Hyperlipidemia Father   . Hypertension Father   . Diabetes Sister   . Hypertension Sister   . Heart disease Sister     Before age 66  . Hyperlipidemia Sister   . Varicose Veins Sister   . Cancer Brother   . Cancer Maternal Aunt     breast cancer   . Diabetes Maternal Grandmother   . Diabetes Paternal Grandmother    Social History  Substance Use Topics  . Smoking  status: Former Smoker -- 1.00 packs/day for 20 years    Types: Cigarettes    Quit date: 06/21/2013  . Smokeless tobacco: Never Used  . Alcohol Use: No   OB History    No data available      Review of Systems  Constitutional: Negative for fever and chills.  Respiratory: Negative for shortness of breath.   Cardiovascular: Negative for chest pain.  Gastrointestinal: Positive for nausea, vomiting, abdominal pain and diarrhea. Negative for constipation and blood in stool.  Genitourinary: Negative for dysuria, urgency and frequency.  All other systems reviewed and are negative.     Allergies  Review of patient's allergies indicates no known allergies.  Home Medications   Prior to Admission medications   Medication Sig Start Date End Date Taking? Authorizing Provider  ACCU-CHEK AVIVA PLUS test strip USE AS INSTRUCTED 09/30/15  Yes Tresa Garter, MD  acetaminophen (TYLENOL) 650 MG CR tablet Take 1 tablet (650 mg total) by mouth every 8 (eight) hours as needed for pain. 03/27/15  Yes Josalyn Funches, MD  atorvastatin (LIPITOR) 20 MG tablet Take 1 tablet (20 mg total) by mouth daily. 03/27/15  Yes Josalyn Funches, MD  benazepril (LOTENSIN) 10 MG tablet Take 1 tablet (10 mg total) by mouth daily. 03/27/15  Yes Josalyn Funches, MD  clopidogrel (PLAVIX) 75 MG tablet TAKE 1 TABLET (75 MG TOTAL) BY MOUTH DAILY WITH BREAKFAST. 08/07/15  Yes Josalyn Funches, MD  cyclobenzaprine (FLEXERIL) 5 MG tablet TAKE 1 TABLET (5 MG TOTAL) BY MOUTH AT BEDTIME. 08/07/15  Yes Josalyn Funches, MD  DULoxetine (CYMBALTA) 30 MG capsule Take 1 capsule (30 mg total) by mouth daily. 05/27/15  Yes Josalyn Funches, MD  famotidine (PEPCID) 20 MG tablet TAKE 1 TABLET BY MOUTH 2 TIMES DAILY. 06/17/15  Yes Tresa Garter, MD  ferrous sulfate 325 (65 FE) MG tablet Take 1 tablet (325 mg total) by mouth daily with breakfast. 03/27/15  Yes Josalyn Funches, MD  gabapentin (NEURONTIN) 300 MG capsule Take 1 capsule (300 mg  total) by mouth 3 (three) times daily. 03/27/15  Yes Josalyn Funches, MD  glipiZIDE (GLUCOTROL) 10 MG tablet TAKE 1 TABLET BY MOUTH 2 TIMES DAILY BEFORE A MEAL. 08/07/15  Yes Josalyn Funches, MD  glucose blood (ACCU-CHEK AVIVA) test strip 1 each by Other route 3 (three) times daily. Use as instructed 03/03/15  Yes Josalyn Funches, MD  Insulin Glargine (LANTUS SOLOSTAR) 100 UNIT/ML Solostar Pen Inject 65 Units into the skin at bedtime. 05/27/15  Yes Boykin Nearing, MD  Lancets (ACCU-CHEK MULTICLIX) lancets Use as instructed 06/19/14  Yes Deepak Advani, MD  loratadine (CLARITIN) 10 MG tablet Take 1 tablet (10 mg total) by mouth daily as needed for allergies or rhinitis. 03/27/15  Yes  Boykin Nearing, MD  oxymetazoline (AFRIN NASAL SPRAY) 0.05 % nasal spray Place 1 spray into both nostrils 2 (two) times daily as needed for congestion. 01/22/14  Yes Deepak Advani, MD  pantoprazole (PROTONIX) 40 MG tablet Take 1 tablet (40 mg total) by mouth daily. 03/27/15  Yes Josalyn Funches, MD  sitaGLIPtin-metformin (JANUMET) 50-1000 MG tablet Take 1 tablet by mouth daily. 05/27/15  Yes Josalyn Funches, MD  Vitamin D, Ergocalciferol, (DRISDOL) 50000 UNITS CAPS capsule Take 50,000 Units by mouth every 7 (seven) days. sunday   Yes Historical Provider, MD  VOLTAREN 1 % GEL APPLY 4 GRAMS TOPICALLY 4 TIMES DAILY. 08/07/15  Yes Josalyn Funches, MD  cephALEXin (KEFLEX) 500 MG capsule Take 1 capsule (500 mg total) by mouth 2 (two) times daily. 10/20/15   Marella Chimes, PA-C  ondansetron (ZOFRAN ODT) 4 MG disintegrating tablet Take 1 tablet (4 mg total) by mouth every 8 (eight) hours as needed for nausea. 10/20/15   Marella Chimes, PA-C    BP 116/65 mmHg  Pulse 104  Temp(Src) 99.1 F (37.3 C) (Oral)  Resp 18  SpO2 97% Physical Exam  Constitutional: She is oriented to person, place, and time. She appears well-developed and well-nourished. No distress.  Patient appears uncomfortable due to pain.  HENT:  Head:  Normocephalic and atraumatic.  Right Ear: External ear normal.  Left Ear: External ear normal.  Nose: Nose normal.  Mouth/Throat: Uvula is midline, oropharynx is clear and moist and mucous membranes are normal.  Eyes: Conjunctivae, EOM and lids are normal. Pupils are equal, round, and reactive to light. Right eye exhibits no discharge. Left eye exhibits no discharge. No scleral icterus.  Neck: Normal range of motion. Neck supple.  Cardiovascular: Normal rate, regular rhythm, normal heart sounds, intact distal pulses and normal pulses.   Pulmonary/Chest: Effort normal and breath sounds normal. No respiratory distress. She has no wheezes. She has no rales.  Abdominal: Soft. Normal appearance and bowel sounds are normal. She exhibits no distension and no mass. There is tenderness. There is no rigidity, no rebound and no guarding.  Diffuse TTP to lower abdomen.  Musculoskeletal: Normal range of motion. She exhibits no edema or tenderness.  Neurological: She is alert and oriented to person, place, and time. She has normal strength. No sensory deficit.  Skin: Skin is warm, dry and intact. No rash noted. She is not diaphoretic. No erythema. No pallor.  Psychiatric: She has a normal mood and affect. Her speech is normal and behavior is normal.  Nursing note and vitals reviewed.   ED Course  Procedures (including critical care time)  Labs Review Labs Reviewed  COMPREHENSIVE METABOLIC PANEL - Abnormal; Notable for the following:    Sodium 132 (*)    Chloride 98 (*)    CO2 20 (*)    Glucose, Bld 323 (*)    All other components within normal limits  CBC - Abnormal; Notable for the following:    WBC 17.5 (*)    RBC 3.51 (*)    Hemoglobin 10.1 (*)    HCT 30.5 (*)    All other components within normal limits  URINALYSIS, ROUTINE W REFLEX MICROSCOPIC (NOT AT [facility]) - Abnormal; Notable for the following:    APPearance CLOUDY (*)    Glucose, UA >1000 (*)    Hgb urine dipstick SMALL (*)     Ketones, ur 40 (*)    Protein, ur 30 (*)    Leukocytes, UA SMALL (*)    All other  components within normal limits  URINE MICROSCOPIC-ADD ON - Abnormal; Notable for the following:    Squamous Epithelial / LPF 6-30 (*)    Bacteria, UA MANY (*)    Casts HYALINE CASTS (*)    All other components within normal limits  CBG MONITORING, ED - Abnormal; Notable for the following:    Glucose-Capillary 314 (*)    All other components within normal limits  CBG MONITORING, ED - Abnormal; Notable for the following:    Glucose-Capillary 301 (*)    All other components within normal limits  URINE CULTURE  LIPASE, BLOOD  I-STAT CG4 LACTIC ACID, ED    Imaging Review Ct Angio Abd/pel W/ And/or W/o  10/20/2015  CLINICAL DATA:  Acute onset of nausea vomiting and diarrhea. Upper abdominal pain. Hyperglycemia. Initial encounter. EXAM: CTA ABDOMEN AND PELVIS wITHOUT AND WITH CONTRAST TECHNIQUE: Multidetector CT imaging of the abdomen and pelvis was performed using the standard protocol during bolus administration of intravenous contrast. Multiplanar reconstructed images and MIPs were obtained and reviewed to evaluate the vascular anatomy. CONTRAST:  100 mL of Isovue 370 IV contrast COMPARISON:  CT of the abdomen and pelvis from 08/06/2015 FINDINGS: The visualized lung bases are clear. The liver and spleen are unremarkable in appearance. The patient is status post cholecystectomy, with clips noted at the gallbladder fossa. The pancreas and adrenal glands are unremarkable. The kidneys are unremarkable in appearance. There is no evidence of hydronephrosis. No renal or ureteral stones are seen. No perinephric stranding is appreciated. No free fluid is identified. The small bowel is unremarkable in appearance. The stomach is within normal limits. No acute vascular abnormalities are seen. Scattered calcification is noted along the abdominal aorta and its branches. Vascular stents are noted at the celiac trunk and superior  mesenteric artery. The appendix is normal in caliber, without evidence of appendicitis. The bladder is moderately distended and grossly unremarkable. The left ovary is grossly unremarkable. No suspicious adnexal masses are seen. No inguinal lymphadenopathy is seen. No acute osseous abnormalities are identified. Review of the MIP images confirms the above findings. IMPRESSION: 1. No acute abnormality seen within the abdomen or pelvis. 2. Scattered calcification along the abdominal aorta and its branches. Electronically Signed   By: Garald Balding M.D.   On: 10/20/2015 21:53   I have personally reviewed and evaluated these images and lab results as part of my medical decision-making.   EKG Interpretation None      MDM   Final diagnoses:  Nausea, vomiting, and diarrhea  Abdominal pain, unspecified abdominal location    65 year old female presents with nausea, vomiting, diarrhea, abdominal pain for the past 3 days. Notes urinary frequency. Denies fever, chills, chest pain, shortness of breath, hematemesis, hematochezia, melena, dysuria, urgency.  Patient is afebrile. Heart rate 105. Heart regular rhythm. Lungs clear to auscultation bilaterally. Abdomen soft, nondistended, with mild diffuse tenderness to palpation to lower abdominal quadrants. No rebound, guarding, or masses.  CBC remarkable for leukocytosis of 17.5, hemoglobin 10.1. CMP remarkable for glucose 323, anion gap within normal limits. Lipase unremarkable. Lactic acid within normal limits. UA with small leukocytes, 6-30 WBC, many bacteria. Given patient reports urinary frequency, will treat for UTI. Urine culture ordered.  Patient given fluids, antiemetics, and pain medication.  CT angiogram abdomen pelvis obtained, which is negative for acute abnormality. Discussed findings with patient. She reports significant symptom improvement. She states her abdominal pain is now resolved. Patient is nontoxic and well-appearing, feel she is stable  for discharge at  this time. Will give keflex for UTI. Nausea, vomiting, and diarrhea most likely viral. Patient to follow up with PCP. Strict return precautions discussed. Patient verbalizes her understanding and is in agreement with plan.  BP 116/65 mmHg  Pulse 104  Temp(Src) 99.1 F (37.3 C) (Oral)  Resp 18  SpO2 97%      Marella Chimes, PA-C 10/21/15 0107  Sharlett Iles, MD 10/21/2015 [phone]

## 2015-10-20 NOTE — ED Notes (Addendum)
Pt presents from home. States she has had N/V/D x 3 days with upper abdominal pain. CBG 328 with EMS. 22g LFA. 4mg  zofran given en route. Alert and oriented.

## 2015-10-20 NOTE — Discharge Instructions (Signed)
1. Medications: keflex, zofran, usual home medications 2. Treatment: rest, drink plenty of fluids 3. Follow Up: please followup with your primary doctor this week for discussion of your diagnoses and further evaluation after today's visit; please return to the ER for high fever, severe pain, persistent vomiting, new or worsening symptoms   Abdominal Pain, Adult Many things can cause belly (abdominal) pain. Most times, the belly pain is not dangerous. Many cases of belly pain can be watched and treated at home. HOME CARE   Do not take medicines that help you go poop (laxatives) unless told to by your doctor.  Only take medicine as told by your doctor.  Eat or drink as told by your doctor. Your doctor will tell you if you should be on a special diet. GET HELP IF:  You do not know what is causing your belly pain.  You have belly pain while you are sick to your stomach (nauseous) or have runny poop (diarrhea).  You have pain while you pee or poop.  Your belly pain wakes you up at night.  You have belly pain that gets worse or better when you eat.  You have belly pain that gets worse when you eat fatty foods.  You have a fever. GET HELP RIGHT AWAY IF:   The pain does not go away within 2 hours.  You keep throwing up (vomiting).  The pain changes and is only in the right or left part of the belly.  You have bloody or tarry looking poop. MAKE SURE YOU:   Understand these instructions.  Will watch your condition.  Will get help right away if you are not doing well or get worse.   This information is not intended to replace advice given to you by your health care provider. Make sure you discuss any questions you have with your health care provider.   Document Released: 12/15/2007 Document Revised: 07/19/2014 Document Reviewed: 03/07/2013 Elsevier Interactive Patient Education 2016 [employer]  Nausea and Vomiting Nausea means you feel sick to your stomach. Throwing up  (vomiting) is a reflex where stomach contents come out of your mouth. HOME CARE   Take medicine as told by your doctor.  Do not force yourself to eat. However, you do need to drink fluids.  If you feel like eating, eat a normal diet as told by your doctor.  Eat rice, wheat, potatoes, bread, lean meats, yogurt, fruits, and vegetables.  Avoid high-fat foods.  Drink enough fluids to keep your pee (urine) clear or pale yellow.  Ask your doctor how to replace body fluid losses (rehydrate). Signs of body fluid loss (dehydration) include:  Feeling very thirsty.  Dry lips and mouth.  Feeling dizzy.  Dark pee.  Peeing less than normal.  Feeling confused.  Fast breathing or heart rate. GET HELP RIGHT AWAY IF:   You have blood in your throw up.  You have black or bloody poop (stool).  You have a bad headache or stiff neck.  You feel confused.  You have bad belly (abdominal) pain.  You have chest pain or trouble breathing.  You do not pee at least once every 8 hours.  You have cold, clammy skin.  You keep throwing up after 24 to 48 hours.  You have a fever. MAKE SURE YOU:   Understand these instructions.  Will watch your condition.  Will get help right away if you are not doing well or get worse.   This information is not intended to  replace advice given to you by your health care provider. Make sure you discuss any questions you have with your health care provider.   Document Released: 12/15/2007 Document Revised: 09/20/2011 Document Reviewed: 11/27/2010 Elsevier Interactive Patient Education [employer].

## 2015-10-22 ENCOUNTER — Encounter (HOSPITAL_COMMUNITY): Payer: Self-pay

## 2015-10-22 ENCOUNTER — Inpatient Hospital Stay (HOSPITAL_COMMUNITY)
Admission: EM | Admit: 2015-10-22 | Discharge: 2015-11-10 | DRG: 270 | Disposition: E | Payer: Medicaid Other | Attending: Pulmonary Disease | Admitting: Pulmonary Disease

## 2015-10-22 ENCOUNTER — Emergency Department (HOSPITAL_COMMUNITY): Payer: Medicaid Other

## 2015-10-22 DIAGNOSIS — E1151 Type 2 diabetes mellitus with diabetic peripheral angiopathy without gangrene: Secondary | ICD-10-CM | POA: Diagnosis present

## 2015-10-22 DIAGNOSIS — R100 Acute abdomen: Secondary | ICD-10-CM | POA: Diagnosis not present

## 2015-10-22 DIAGNOSIS — Z66 Do not resuscitate: Secondary | ICD-10-CM | POA: Diagnosis present

## 2015-10-22 DIAGNOSIS — K219 Gastro-esophageal reflux disease without esophagitis: Secondary | ICD-10-CM | POA: Diagnosis present

## 2015-10-22 DIAGNOSIS — D689 Coagulation defect, unspecified: Secondary | ICD-10-CM | POA: Diagnosis present

## 2015-10-22 DIAGNOSIS — I708 Atherosclerosis of other arteries: Secondary | ICD-10-CM

## 2015-10-22 DIAGNOSIS — E1165 Type 2 diabetes mellitus with hyperglycemia: Secondary | ICD-10-CM | POA: Diagnosis present

## 2015-10-22 DIAGNOSIS — Z961 Presence of intraocular lens: Secondary | ICD-10-CM | POA: Diagnosis present

## 2015-10-22 DIAGNOSIS — Z9842 Cataract extraction status, left eye: Secondary | ICD-10-CM | POA: Diagnosis not present

## 2015-10-22 DIAGNOSIS — D6489 Other specified anemias: Secondary | ICD-10-CM | POA: Diagnosis present

## 2015-10-22 DIAGNOSIS — Z515 Encounter for palliative care: Secondary | ICD-10-CM | POA: Diagnosis not present

## 2015-10-22 DIAGNOSIS — I739 Peripheral vascular disease, unspecified: Secondary | ICD-10-CM | POA: Diagnosis not present

## 2015-10-22 DIAGNOSIS — J9602 Acute respiratory failure with hypercapnia: Secondary | ICD-10-CM

## 2015-10-22 DIAGNOSIS — G9341 Metabolic encephalopathy: Secondary | ICD-10-CM | POA: Diagnosis not present

## 2015-10-22 DIAGNOSIS — E1142 Type 2 diabetes mellitus with diabetic polyneuropathy: Secondary | ICD-10-CM | POA: Diagnosis present

## 2015-10-22 DIAGNOSIS — Z90710 Acquired absence of both cervix and uterus: Secondary | ICD-10-CM

## 2015-10-22 DIAGNOSIS — Z79899 Other long term (current) drug therapy: Secondary | ICD-10-CM | POA: Diagnosis not present

## 2015-10-22 DIAGNOSIS — Z794 Long term (current) use of insulin: Secondary | ICD-10-CM | POA: Diagnosis not present

## 2015-10-22 DIAGNOSIS — R109 Unspecified abdominal pain: Secondary | ICD-10-CM | POA: Diagnosis present

## 2015-10-22 DIAGNOSIS — R69 Illness, unspecified: Secondary | ICD-10-CM | POA: Diagnosis not present

## 2015-10-22 DIAGNOSIS — K659 Peritonitis, unspecified: Secondary | ICD-10-CM | POA: Diagnosis not present

## 2015-10-22 DIAGNOSIS — K55059 Acute (reversible) ischemia of intestine, part and extent unspecified: Secondary | ICD-10-CM | POA: Diagnosis not present

## 2015-10-22 DIAGNOSIS — E872 Acidosis, unspecified: Secondary | ICD-10-CM

## 2015-10-22 DIAGNOSIS — G8929 Other chronic pain: Secondary | ICD-10-CM | POA: Diagnosis present

## 2015-10-22 DIAGNOSIS — Z955 Presence of coronary angioplasty implant and graft: Secondary | ICD-10-CM

## 2015-10-22 DIAGNOSIS — I110 Hypertensive heart disease with heart failure: Secondary | ICD-10-CM | POA: Diagnosis present

## 2015-10-22 DIAGNOSIS — R51 Headache: Secondary | ICD-10-CM | POA: Diagnosis present

## 2015-10-22 DIAGNOSIS — E785 Hyperlipidemia, unspecified: Secondary | ICD-10-CM | POA: Diagnosis present

## 2015-10-22 DIAGNOSIS — Z823 Family history of stroke: Secondary | ICD-10-CM

## 2015-10-22 DIAGNOSIS — M1990 Unspecified osteoarthritis, unspecified site: Secondary | ICD-10-CM | POA: Diagnosis present

## 2015-10-22 DIAGNOSIS — Z978 Presence of other specified devices: Secondary | ICD-10-CM

## 2015-10-22 DIAGNOSIS — I2510 Atherosclerotic heart disease of native coronary artery without angina pectoris: Secondary | ICD-10-CM | POA: Diagnosis present

## 2015-10-22 DIAGNOSIS — K551 Chronic vascular disorders of intestine: Secondary | ICD-10-CM | POA: Diagnosis present

## 2015-10-22 DIAGNOSIS — M545 Low back pain: Secondary | ICD-10-CM | POA: Diagnosis present

## 2015-10-22 DIAGNOSIS — E874 Mixed disorder of acid-base balance: Secondary | ICD-10-CM | POA: Diagnosis present

## 2015-10-22 DIAGNOSIS — J9601 Acute respiratory failure with hypoxia: Secondary | ICD-10-CM | POA: Diagnosis not present

## 2015-10-22 DIAGNOSIS — K55069 Acute infarction of intestine, part and extent unspecified: Secondary | ICD-10-CM | POA: Diagnosis present

## 2015-10-22 DIAGNOSIS — I5032 Chronic diastolic (congestive) heart failure: Secondary | ICD-10-CM | POA: Diagnosis present

## 2015-10-22 DIAGNOSIS — I10 Essential (primary) hypertension: Secondary | ICD-10-CM | POA: Diagnosis present

## 2015-10-22 DIAGNOSIS — R6521 Severe sepsis with septic shock: Secondary | ICD-10-CM | POA: Diagnosis not present

## 2015-10-22 DIAGNOSIS — N12 Tubulo-interstitial nephritis, not specified as acute or chronic: Secondary | ICD-10-CM | POA: Diagnosis present

## 2015-10-22 DIAGNOSIS — N179 Acute kidney failure, unspecified: Secondary | ICD-10-CM | POA: Diagnosis not present

## 2015-10-22 DIAGNOSIS — G9340 Encephalopathy, unspecified: Secondary | ICD-10-CM | POA: Diagnosis not present

## 2015-10-22 DIAGNOSIS — IMO0002 Reserved for concepts with insufficient information to code with codable children: Secondary | ICD-10-CM | POA: Diagnosis present

## 2015-10-22 DIAGNOSIS — Z7902 Long term (current) use of antithrombotics/antiplatelets: Secondary | ICD-10-CM

## 2015-10-22 DIAGNOSIS — Z8249 Family history of ischemic heart disease and other diseases of the circulatory system: Secondary | ICD-10-CM | POA: Diagnosis not present

## 2015-10-22 DIAGNOSIS — Z833 Family history of diabetes mellitus: Secondary | ICD-10-CM | POA: Diagnosis not present

## 2015-10-22 DIAGNOSIS — A419 Sepsis, unspecified organism: Secondary | ICD-10-CM | POA: Diagnosis not present

## 2015-10-22 DIAGNOSIS — R1110 Vomiting, unspecified: Secondary | ICD-10-CM

## 2015-10-22 DIAGNOSIS — T82856A Stenosis of peripheral vascular stent, initial encounter: Secondary | ICD-10-CM | POA: Diagnosis present

## 2015-10-22 DIAGNOSIS — I8290 Acute embolism and thrombosis of unspecified vein: Secondary | ICD-10-CM | POA: Diagnosis present

## 2015-10-22 DIAGNOSIS — K7200 Acute and subacute hepatic failure without coma: Secondary | ICD-10-CM | POA: Diagnosis present

## 2015-10-22 DIAGNOSIS — I748 Embolism and thrombosis of other arteries: Secondary | ICD-10-CM

## 2015-10-22 DIAGNOSIS — J9690 Respiratory failure, unspecified, unspecified whether with hypoxia or hypercapnia: Secondary | ICD-10-CM

## 2015-10-22 DIAGNOSIS — Z87891 Personal history of nicotine dependence: Secondary | ICD-10-CM

## 2015-10-22 DIAGNOSIS — J45909 Unspecified asthma, uncomplicated: Secondary | ICD-10-CM | POA: Diagnosis present

## 2015-10-22 DIAGNOSIS — G2581 Restless legs syndrome: Secondary | ICD-10-CM | POA: Diagnosis present

## 2015-10-22 LAB — URINALYSIS, ROUTINE W REFLEX MICROSCOPIC
Bilirubin Urine: NEGATIVE
Nitrite: NEGATIVE
PH: 5.5 (ref 5.0–8.0)
Protein, ur: 100 mg/dL — AB
Specific Gravity, Urine: 1.022 (ref 1.005–1.030)

## 2015-10-22 LAB — COMPREHENSIVE METABOLIC PANEL
ALBUMIN: 3.9 g/dL (ref 3.5–5.0)
ALK PHOS: 114 U/L (ref 38–126)
ALT: 44 U/L (ref 14–54)
AST: 51 U/L — ABNORMAL HIGH (ref 15–41)
Anion gap: 19 — ABNORMAL HIGH (ref 5–15)
BUN: 15 mg/dL (ref 6–20)
CALCIUM: 9.3 mg/dL (ref 8.9–10.3)
CO2: 18 mmol/L — ABNORMAL LOW (ref 22–32)
CREATININE: 0.69 mg/dL (ref 0.44–1.00)
Chloride: 100 mmol/L — ABNORMAL LOW (ref 101–111)
GFR calc Af Amer: >60 mL/min (ref >60)
GLUCOSE: 276 mg/dL — AB (ref 65–99)
POTASSIUM: 4.3 mmol/L (ref 3.5–5.1)
Sodium: 137 mmol/L (ref 135–145)
TOTAL PROTEIN: 7 g/dL (ref 6.5–8.1)
Total Bilirubin: 1.5 mg/dL — ABNORMAL HIGH (ref 0.3–1.2)

## 2015-10-22 LAB — CBC WITH DIFFERENTIAL/PLATELET
BASOS PCT: 0 %
Basophils Absolute: 0 10*3/uL (ref 0.0–0.1)
Eosinophils Absolute: 0 10*3/uL (ref 0.0–0.7)
Eosinophils Relative: 0 %
HEMATOCRIT: 31.9 % — AB (ref 36.0–46.0)
HEMOGLOBIN: 10.6 g/dL — AB (ref 12.0–15.0)
LYMPHS ABS: 0.6 10*3/uL — AB (ref 0.7–4.0)
Lymphocytes Relative: 4 %
MCH: 28.9 pg (ref 26.0–34.0)
MCHC: 33.2 g/dL (ref 30.0–36.0)
MCV: 86.9 fL (ref 78.0–100.0)
MONO ABS: 0.6 10*3/uL (ref 0.1–1.0)
MONOS PCT: 4 %
NEUTROS ABS: 14.6 10*3/uL — AB (ref 1.7–7.7)
NEUTROS PCT: 92 %
Platelets: 433 10*3/uL — ABNORMAL HIGH (ref 150–400)
RBC: 3.67 MIL/uL — ABNORMAL LOW (ref 3.87–5.11)
RDW: 14.5 % (ref 11.5–15.5)
WBC: 15.8 10*3/uL — ABNORMAL HIGH (ref 4.0–10.5)

## 2015-10-22 LAB — LACTIC ACID, PLASMA
LACTIC ACID, VENOUS: 3.1 mmol/L — AB (ref 0.5–2.0)
LACTIC ACID, VENOUS: 3.8 mmol/L — AB (ref 0.5–2.0)
Lactic Acid, Venous: 3 mmol/L (ref 0.5–2.0)

## 2015-10-22 LAB — URINE MICROSCOPIC-ADD ON

## 2015-10-22 LAB — LIPASE, BLOOD: Lipase: 25 U/L (ref 11–51)

## 2015-10-22 LAB — GLUCOSE, CAPILLARY: Glucose-Capillary: 254 mg/dL — ABNORMAL HIGH (ref 65–99)

## 2015-10-22 LAB — LACTATE DEHYDROGENASE: LDH: 886 U/L — AB (ref 98–192)

## 2015-10-22 MED ORDER — SODIUM CHLORIDE 0.9 % IV SOLN: INTRAVENOUS | Status: DC

## 2015-10-22 MED ORDER — NALOXONE HCL 0.4 MG/ML IJ SOLN
0.4000 mg | INTRAMUSCULAR | Status: DC | PRN
  Administered 2015-10-23: 1 mg via INTRAVENOUS
  Filled 2015-10-22 (×2): qty 1

## 2015-10-22 MED ORDER — HYDROMORPHONE HCL 1 MG/ML IJ SOLN
1.0000 mg | INTRAMUSCULAR | Status: DC | PRN
Start: 2015-10-22 — End: 2015-10-23
  Administered 2015-10-22 – 2015-10-23 (×2): 1 mg via INTRAVENOUS
  Filled 2015-10-22 (×2): qty 1

## 2015-10-22 MED ORDER — ONDANSETRON HCL 4 MG/2ML IJ SOLN
4.0000 mg | Freq: Four times a day (QID) | INTRAMUSCULAR | Status: DC | PRN
  Administered 2015-10-22 – 2015-10-23 (×2): 4 mg via INTRAVENOUS
  Filled 2015-10-22 (×2): qty 2

## 2015-10-22 MED ORDER — INSULIN DETEMIR 100 UNIT/ML ~~LOC~~ SOLN
40.0000 [IU] | Freq: Every day | SUBCUTANEOUS | Status: DC
  Administered 2015-10-22: 40 [IU] via SUBCUTANEOUS
  Filled 2015-10-22: qty 0.4

## 2015-10-22 MED ORDER — DEXTROSE 5 % IV SOLN
1.0000 g | INTRAVENOUS | Status: DC
  Filled 2015-10-22: qty 10

## 2015-10-22 MED ORDER — ONDANSETRON 4 MG PO TBDP: 4.0000 mg | ORAL_TABLET | Freq: Three times a day (TID) | ORAL | Status: DC | PRN

## 2015-10-22 MED ORDER — DULOXETINE HCL 30 MG PO CPEP
30.0000 mg | ORAL_CAPSULE | Freq: Every day | ORAL | Status: DC
  Filled 2015-10-22: qty 1

## 2015-10-22 MED ORDER — SODIUM CHLORIDE 0.9 % IV SOLN
INTRAVENOUS | Status: DC
  Administered 2015-10-22 – 2015-10-23 (×2): via INTRAVENOUS

## 2015-10-22 MED ORDER — HYDROMORPHONE 1 MG/ML IV SOLN
INTRAVENOUS | Status: DC
  Administered 2015-10-22: 19:00:00 via INTRAVENOUS
  Administered 2015-10-23: 0.3 mg via INTRAVENOUS
  Administered 2015-10-23: 1.5 mg via INTRAVENOUS
  Administered 2015-10-23: 2 mg via INTRAVENOUS
  Administered 2015-10-23: 0.8 mg via INTRAVENOUS
  Filled 2015-10-22: qty 25

## 2015-10-22 MED ORDER — DIATRIZOATE MEGLUMINE & SODIUM 66-10 % PO SOLN
30.0000 mL | Freq: Once | ORAL | Status: DC
  Filled 2015-10-22: qty 30

## 2015-10-22 MED ORDER — MORPHINE SULFATE (PF) 4 MG/ML IV SOLN: 4.0000 mg | INTRAVENOUS | Status: DC | PRN

## 2015-10-22 MED ORDER — ATORVASTATIN CALCIUM 20 MG PO TABS
20.0000 mg | ORAL_TABLET | Freq: Every day | ORAL | Status: DC
  Filled 2015-10-22: qty 1

## 2015-10-22 MED ORDER — SODIUM CHLORIDE 0.9 % IV SOLN
1000.0000 mL | Freq: Once | INTRAVENOUS | Status: AC
  Administered 2015-10-22: 1000 mL via INTRAVENOUS

## 2015-10-22 MED ORDER — DIPHENHYDRAMINE HCL 12.5 MG/5ML PO ELIX
12.5000 mg | ORAL_SOLUTION | Freq: Four times a day (QID) | ORAL | Status: DC | PRN
  Filled 2015-10-22: qty 5

## 2015-10-22 MED ORDER — SODIUM CHLORIDE 0.9% FLUSH: 9.0000 mL | INTRAVENOUS | Status: DC | PRN

## 2015-10-22 MED ORDER — CLOPIDOGREL BISULFATE 75 MG PO TABS
75.0000 mg | ORAL_TABLET | Freq: Every day | ORAL | Status: DC
  Filled 2015-10-22: qty 1

## 2015-10-22 MED ORDER — HEPARIN SODIUM (PORCINE) 5000 UNIT/ML IJ SOLN
5000.0000 [IU] | Freq: Three times a day (TID) | INTRAMUSCULAR | Status: DC
Start: 2015-10-22 — End: 2015-10-23
  Administered 2015-10-22 – 2015-10-23 (×2): 5000 [IU] via SUBCUTANEOUS
  Filled 2015-10-22 (×3): qty 1

## 2015-10-22 MED ORDER — MORPHINE SULFATE (PF) 4 MG/ML IV SOLN
6.0000 mg | Freq: Once | INTRAVENOUS | Status: AC
  Administered 2015-10-22: 6 mg via INTRAVENOUS
  Filled 2015-10-22: qty 2

## 2015-10-22 MED ORDER — IOPAMIDOL (ISOVUE-300) INJECTION 61%
100.0000 mL | Freq: Once | INTRAVENOUS | Status: AC | PRN
  Administered 2015-10-22: 100 mL via INTRAVENOUS

## 2015-10-22 MED ORDER — DIPHENHYDRAMINE HCL 50 MG/ML IJ SOLN: 12.5000 mg | Freq: Four times a day (QID) | INTRAMUSCULAR | Status: DC | PRN

## 2015-10-22 MED ORDER — INSULIN GLARGINE 100 UNIT/ML SOLOSTAR PEN: 65.0000 [IU] | PEN_INJECTOR | Freq: Every day | SUBCUTANEOUS | Status: DC

## 2015-10-22 MED ORDER — ONDANSETRON HCL 4 MG PO TABS: 4.0000 mg | ORAL_TABLET | Freq: Four times a day (QID) | ORAL | Status: DC | PRN

## 2015-10-22 MED ORDER — INSULIN ASPART 100 UNIT/ML ~~LOC~~ SOLN
0.0000 [IU] | SUBCUTANEOUS | Status: DC
  Administered 2015-10-23: 3 [IU] via SUBCUTANEOUS
  Administered 2015-10-23: 8 [IU] via SUBCUTANEOUS

## 2015-10-22 MED ORDER — SODIUM CHLORIDE 0.9 % IV SOLN
1000.0000 mL | INTRAVENOUS | Status: DC
Start: 2015-10-22 — End: 2015-10-22

## 2015-10-22 MED ORDER — SODIUM CHLORIDE 0.9 % IV BOLUS (SEPSIS)
500.0000 mL | Freq: Once | INTRAVENOUS | Status: AC
  Administered 2015-10-23: 500 mL via INTRAVENOUS

## 2015-10-22 MED ORDER — ONDANSETRON HCL 4 MG/2ML IJ SOLN
4.0000 mg | Freq: Four times a day (QID) | INTRAMUSCULAR | Status: DC | PRN
Start: 2015-10-22 — End: 2015-10-22

## 2015-10-22 MED ORDER — ONDANSETRON HCL 4 MG/2ML IJ SOLN
4.0000 mg | Freq: Once | INTRAMUSCULAR | Status: AC
  Administered 2015-10-22 – 2015-10-23 (×2): 4 mg via INTRAVENOUS
  Filled 2015-10-22: qty 2

## 2015-10-22 MED ORDER — CEFTRIAXONE SODIUM 1 G IJ SOLR
1.0000 g | Freq: Once | INTRAMUSCULAR | Status: AC
  Administered 2015-10-22: 1 g via INTRAVENOUS
  Filled 2015-10-22: qty 10

## 2015-10-22 NOTE — ED Notes (Signed)
Patient transported by [facility] from home.  Patient c/o generalized abdominal pain and vomiting x 4days.  Patient was seen here on 4/10 for same symptoms.  Patient reports no improvement.  Patient states she has been unable to tolerate any PO intake.

## 2015-10-22 NOTE — ED Notes (Signed)
Bed: [id number] Expected date: 10/17/2015 Expected time:  Means of arrival:  Comments: EMS

## 2015-10-22 NOTE — ED Provider Notes (Addendum)
CSN: [id number]     Arrival date & time 10/30/2015  1056 History   First MD Initiated Contact with Patient 10/11/2015 1110     Chief Complaint  Patient presents with  . Abdominal Pain     HPI Patient presents the emergency department with increasing abdominal pain as well as recurrent nausea vomiting.  She was in emergency department on April 10 for similar symptoms.  She states that she was told she had a urinary tract infection and was placed on Keflex.  She is feeling better time of discharge and pressure for she was discharged home but she states since returning home she's continued having persistent nausea vomiting has been unable to keep any food or fluids down.  Pain in her abdomen is moderate to severe at this time and localized to her right lower quadrant.  Urine culture Has returned and is growing out greater than 100,000 gram-negative rods.  Sensitivities are not back yet.   Past Medical History  Diagnosis Date  . Staph infection   . Restless leg syndrome   . Chronic mesenteric ischemia (HCC)     s/p SMA stent 06/22/13  . Coronary artery disease   . Hypertension   . Hyperlipidemia   . Heart murmur   . Asthma     "mild touch" (02/11/2015)  . Anemia   . GERD (gastroesophageal reflux disease)   . Chronic lower back pain   . Type II diabetes mellitus ([place])   . Diabetic peripheral neuropathy ([place])     "in my legs" (02/11/2015)  . Daily headache   . Arthritis     "hands" (02/25/2015)   Past Surgical History  Procedure Laterality Date  . Endarterectomy Right 07/19/2013    Procedure: ENDARTERECTOMY CAROTID-RIGHT;  Surgeon: Angelia Mould, MD;  Location: [place];  Service: Vascular;  Laterality: Right;  . Insertion of retrograde carotid stent Left 03/05/2014    Procedure: 1)Cutdown of open exposure left common carotid; 2) Retrograde left common carotid stent; 3) Repair of left common carotid artery.;  Surgeon: Elam Dutch, MD;  Location: [place];  Service: Vascular;  Laterality:  Left;  . Arch aortogram N/A 02/11/2014    Procedure: ARCH AORTOGRAM;  Surgeon: Angelia Mould, MD;  Location: [facility] CATH LAB;  Service: Cardiovascular;  Laterality: N/A;  . Left heart catheterization with coronary angiogram N/A 10/03/2014    Procedure: LEFT HEART CATHETERIZATION WITH CORONARY ANGIOGRAM;  Surgeon: Burnell Blanks, MD;  Location: [facility] CATH LAB;  Service: Cardiovascular;  Laterality: N/A;  . Cataract extraction w/ intraocular lens implant Left 02/03/2015  . Tonsillectomy  1950's  . Finger fracture surgery Left 2015    "they had to rebreak it and put pins in it"  . Fracture surgery    . Abdominal hysterectomy    . Coronary angioplasty      ? LCX stent ~ 2000 in [place], [place] with LCX stent [facility] 09/1999 for reported re-instent stenosis  . Esophagogastroduodenoscopy (egd) with propofol Left 02/13/2015    Procedure: ESOPHAGOGASTRODUODENOSCOPY (EGD) WITH PROPOFOL;  Surgeon: Ronald Lobo, MD;  Location: [place];  Service: Endoscopy;  Laterality: Left;  . Colonoscopy with propofol N/A 02/14/2015    Procedure: COLONOSCOPY WITH PROPOFOL;  Surgeon: Ronald Lobo, MD;  Location: [facility] ENDOSCOPY;  Service: Endoscopy;  Laterality: N/A;  . Laparoscopic cholecystectomy    . Givens capsule study N/A 02/27/2015    Procedure: GIVENS CAPSULE STUDY;  Surgeon: Wilford Corner, MD;  Location: [facility] ENDOSCOPY;  Service: Endoscopy;  Laterality: N/A;  Family History  Problem Relation Age of Onset  . Diabetes Mother   . Stroke Mother   . Hyperlipidemia Mother   . Heart disease Mother   . Hypertension Mother   . Heart attack Mother   . Cancer Father   . Heart disease Father     PVD and  CAROTID  . Heart attack Father   . Deep vein thrombosis Father   . Hyperlipidemia Father   . Hypertension Father   . Diabetes Sister   . Hypertension Sister   . Heart disease Sister     Before age 62  . Hyperlipidemia Sister   . Varicose Veins Sister   . Cancer Brother   . Cancer Maternal Aunt      breast cancer   . Diabetes Maternal Grandmother   . Diabetes Paternal Grandmother    Social History  Substance Use Topics  . Smoking status: Former Smoker -- 1.00 packs/day for 20 years    Types: Cigarettes    Quit date: 06/21/2013  . Smokeless tobacco: Never Used  . Alcohol Use: No   OB History    No data available     Review of Systems  All other systems reviewed and are negative.     Allergies  Review of patient's allergies indicates no known allergies.  Home Medications   Prior to Admission medications   Medication Sig Start Date End Date Taking? Authorizing Provider  acetaminophen (TYLENOL) 650 MG CR tablet Take 1 tablet (650 mg total) by mouth every 8 (eight) hours as needed for pain. 03/27/15  Yes Josalyn Funches, MD  atorvastatin (LIPITOR) 20 MG tablet Take 1 tablet (20 mg total) by mouth daily. 03/27/15  Yes Josalyn Funches, MD  benazepril (LOTENSIN) 10 MG tablet Take 1 tablet (10 mg total) by mouth daily. 03/27/15  Yes Josalyn Funches, MD  cephALEXin (KEFLEX) 500 MG capsule Take 1 capsule (500 mg total) by mouth 2 (two) times daily. 10/20/15  Yes Marella Chimes, PA-C  clopidogrel (PLAVIX) 75 MG tablet TAKE 1 TABLET (75 MG TOTAL) BY MOUTH DAILY WITH BREAKFAST. 08/07/15  Yes Josalyn Funches, MD  cyclobenzaprine (FLEXERIL) 5 MG tablet TAKE 1 TABLET (5 MG TOTAL) BY MOUTH AT BEDTIME. 08/07/15  Yes Josalyn Funches, MD  DULoxetine (CYMBALTA) 30 MG capsule Take 1 capsule (30 mg total) by mouth daily. 05/27/15  Yes Josalyn Funches, MD  famotidine (PEPCID) 20 MG tablet TAKE 1 TABLET BY MOUTH 2 TIMES DAILY. 06/17/15  Yes Tresa Garter, MD  ferrous sulfate 325 (65 FE) MG tablet Take 1 tablet (325 mg total) by mouth daily with breakfast. 03/27/15  Yes Josalyn Funches, MD  gabapentin (NEURONTIN) 300 MG capsule Take 1 capsule (300 mg total) by mouth 3 (three) times daily. 03/27/15  Yes Josalyn Funches, MD  glipiZIDE (GLUCOTROL) 10 MG tablet TAKE 1 TABLET BY MOUTH 2 TIMES DAILY  BEFORE A MEAL. 08/07/15  Yes Josalyn Funches, MD  Insulin Glargine (LANTUS SOLOSTAR) 100 UNIT/ML Solostar Pen Inject 65 Units into the skin at bedtime. 05/27/15  Yes Josalyn Funches, MD  loratadine (CLARITIN) 10 MG tablet Take 1 tablet (10 mg total) by mouth daily as needed for allergies or rhinitis. 03/27/15  Yes Josalyn Funches, MD  ondansetron (ZOFRAN ODT) 4 MG disintegrating tablet Take 1 tablet (4 mg total) by mouth every 8 (eight) hours as needed for nausea. 10/20/15  Yes Marella Chimes, PA-C  oxymetazoline (AFRIN NASAL SPRAY) 0.05 % nasal spray Place 1 spray into both nostrils 2 (two) times  daily as needed for congestion. 01/22/14  Yes Deepak Advani, MD  pantoprazole (PROTONIX) 40 MG tablet Take 1 tablet (40 mg total) by mouth daily. 03/27/15  Yes Josalyn Funches, MD  sitaGLIPtin-metformin (JANUMET) 50-1000 MG tablet Take 1 tablet by mouth daily. 05/27/15  Yes Josalyn Funches, MD  Vitamin D, Ergocalciferol, (DRISDOL) 50000 UNITS CAPS capsule Take 50,000 Units by mouth every 7 (seven) days. sunday   Yes Historical Provider, MD  VOLTAREN 1 % GEL APPLY 4 GRAMS TOPICALLY 4 TIMES DAILY. 08/07/15  Yes Josalyn Funches, MD  ACCU-CHEK AVIVA PLUS test strip USE AS INSTRUCTED 09/30/15   Tresa Garter, MD  glucose blood (ACCU-CHEK AVIVA) test strip 1 each by Other route 3 (three) times daily. Use as instructed 03/03/15   Boykin Nearing, MD  Lancets (ACCU-CHEK MULTICLIX) lancets Use as instructed 06/19/14   Deepak Advani, MD   BP 170/84 mmHg  Pulse 119  Resp 20  SpO2 98% Physical Exam  Constitutional: She is oriented to person, place, and time. She appears well-developed and well-nourished. No distress.  HENT:  Head: Normocephalic and atraumatic.  Eyes: EOM are normal.  Neck: Normal range of motion.  Cardiovascular: Normal rate, regular rhythm and normal heart sounds.   Pulmonary/Chest: Effort normal and breath sounds normal.  Abdominal: Soft. She exhibits no distension.  Right lower quadrant  tenderness without guarding or rebound  Musculoskeletal: Normal range of motion.  Neurological: She is alert and oriented to person, place, and time.  Skin: Skin is warm and dry.  Psychiatric: She has a normal mood and affect. Judgment normal.  Nursing note and vitals reviewed.   ED Course  Procedures (including critical care time) Labs Review Labs Reviewed  CBC WITH DIFFERENTIAL/PLATELET - Abnormal; Notable for the following:    WBC 15.8 (*)    RBC 3.67 (*)    Hemoglobin 10.6 (*)    HCT 31.9 (*)    Platelets 433 (*)    Neutro Abs 14.6 (*)    Lymphs Abs 0.6 (*)    All other components within normal limits  COMPREHENSIVE METABOLIC PANEL - Abnormal; Notable for the following:    Chloride 100 (*)    CO2 18 (*)    Glucose, Bld 276 (*)    AST 51 (*)    Total Bilirubin 1.5 (*)    Anion gap 19 (*)    All other components within normal limits  URINALYSIS, ROUTINE W REFLEX MICROSCOPIC (NOT AT [facility]) - Abnormal; Notable for the following:    APPearance CLOUDY (*)    Glucose, UA >1000 (*)    Hgb urine dipstick SMALL (*)    Ketones, ur >80 (*)    Protein, ur 100 (*)    Leukocytes, UA SMALL (*)    All other components within normal limits  URINE MICROSCOPIC-ADD ON - Abnormal; Notable for the following:    Squamous Epithelial / LPF 6-30 (*)    Bacteria, UA RARE (*)    All other components within normal limits  URINE CULTURE  LIPASE, BLOOD    Imaging Review Ct Abdomen Pelvis W Contrast  10/30/2015  CLINICAL DATA:  Severe right lower quadrant pain with vomiting for 4 days. Chronic mesenteric ischemia EXAM: CT ABDOMEN AND PELVIS WITH CONTRAST TECHNIQUE: Multidetector CT imaging of the abdomen and pelvis was performed using the standard protocol following bolus administration of intravenous contrast. CONTRAST:  169mL ISOVUE-300 IOPAMIDOL (ISOVUE-300) INJECTION 61% COMPARISON:  10/20/2015 FINDINGS: Lower chest and abdominal wall:  Extensive coronary atherosclerosis. Hepatobiliary: No focal  liver abnormality.Cholecystectomy with normal common bile duct diameter. Pancreas: Generalized atrophy.  No inflammation or duct enlargement Spleen: Diffusely poor enhancement when compared to prior Adrenals/Urinary Tract: Negative adrenals. No hydronephrosis or stone. Unremarkable bladder. Reproductive:Hysterectomy. Stomach/Bowel: Fold thickening involving the proximal small bowel which is nonspecific. There is no signs of bowel necrosis. No appendicitis. Vascular/Lymphatic: Extensive atherosclerosis status post stenting of the celiac and SMA. Based on recent CTA, there is in stent stenosis or occlusion of both the celiac and proximal SMA stents. Today, there is poor opacification of the celiac branches especially when compared to the distal SMA and SMA branches, despite being in the nephrographic/portal venous phase. These vessels do opacify on the excretory phase. There is correlate poor enhancement of the spleen, especially when compared to the kidneys and prior study. Peritoneal: No ascites or pneumoperitoneum. Musculoskeletal: No acute abnormalities. These results were called by telephone at the time of interpretation on 10/25/2015 at 4:02 pm to Dr. Jola Schmidt , who verbally acknowledged these results. IMPRESSION: 1. 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 This proximal SMA stent occlusion has occurred since January 2017 CTA. Celiac branch opacification and splenic enhancement is poor/delayed, even when compared to CTA 2 days ago. 2. Proximal small bowel fold thickening could ischemic given #1, or from infectious enteritis in this patient with vomiting. 3. History of bacturia.  No signs of pyelonephritis. Electronically Signed   By: Monte Fantasia M.D.   On: 10/23/2015 16:05   Dg Abd 2 Views  11/07/2015  CLINICAL DATA:  Nausea, vomiting and diarrhea since yesterday. Generalized abdominal pain. History of diabetes. EXAM: ABDOMEN - 2 VIEW COMPARISON:  CT 10/20/2015.  Portable  chest 03/03/2015. FINDINGS: The bowel gas pattern is normal. There is no free intraperitoneal air. Cholecystectomy clips and vascular stents are noted. There are no suspicious abdominal calcifications. Mild atelectasis is present at both lung bases. IMPRESSION: No active abdominal findings. Electronically Signed   By: Richardean Sale M.D.   On: 10/20/2015 13:38   Ct Angio Abd/pel W/ And/or W/o  10/20/2015  CLINICAL DATA:  Acute onset of nausea vomiting and diarrhea. Upper abdominal pain. Hyperglycemia. Initial encounter. EXAM: CTA ABDOMEN AND PELVIS wITHOUT AND WITH CONTRAST TECHNIQUE: Multidetector CT imaging of the abdomen and pelvis was performed using the standard protocol during bolus administration of intravenous contrast. Multiplanar reconstructed images and MIPs were obtained and reviewed to evaluate the vascular anatomy. CONTRAST:  100 mL of Isovue 370 IV contrast COMPARISON:  CT of the abdomen and pelvis from 08/06/2015 FINDINGS: The visualized lung bases are clear. The liver and spleen are unremarkable in appearance. The patient is status post cholecystectomy, with clips noted at the gallbladder fossa. The pancreas and adrenal glands are unremarkable. The kidneys are unremarkable in appearance. There is no evidence of hydronephrosis. No renal or ureteral stones are seen. No perinephric stranding is appreciated. No free fluid is identified. The small bowel is unremarkable in appearance. The stomach is within normal limits. No acute vascular abnormalities are seen. Scattered calcification is noted along the abdominal aorta and its branches. Vascular stents are noted at the celiac trunk and superior mesenteric artery. The appendix is normal in caliber, without evidence of appendicitis. The bladder is moderately distended and grossly unremarkable. The left ovary is grossly unremarkable. No suspicious adnexal masses are seen. No inguinal lymphadenopathy is seen. No acute osseous abnormalities are  identified. Review of the MIP images confirms the above findings. IMPRESSION: 1. No acute abnormality seen within the  abdomen or pelvis. 2. Scattered calcification along the abdominal aorta and its branches. Electronically Signed   By: Garald Balding M.D.   On: 10/20/2015 21:53   I have personally reviewed and evaluated these images and lab results as part of my medical decision-making.   EKG Interpretation None      MDM   Final diagnoses:  Vomiting  Pyelonephritis    Patient with severe nausea vomiting and ongoing right-sided pain.  This is likely right-sided pyelonephritis.  CT scan from the 10th was without acute pathology.  No obstructing ureteral stones noted.  Given the patient's ongoing recurrent symptoms she will be treated for pyelonephritis and admitted to the hospital.  4:06 PM Patient has been followed by interventional radiology for her SMA and celiac artery stents.  There is a change in the appearance of her SMA today on CT scan although this is not a CT angiogram of her abdomen and pelvis.  I think interventional radiology will need to take a look at him figure out whether or not they need to do selective angiogram of this SMA or whether or not she'll benefit from repeat CT angiogram of her abdomen pelvis.  Clinically she has more right-sided pyelonephritis given greater than 100,000 gram-negative rods.  She's been given antibiotics.  Lactate pending.  Admit to hospitalist.  4:18 PM I spoke with IR who will evaluate the CT scan at this time and given additional recommendations at this time  4:35 PM I spoke with interventional radiology who reviewed the images and reports there is no indication for emergent evaluation tonight.  He recommends repeat CT angiogram abdomen and pelvis tomorrow and if abnormal may benefit from directed Angio at that time.  Interventional radiology will follow along   Jola Schmidt, MD 10/11/2015 [place], MD 10/28/2015 [place], MD 10/25/2015 [place], MD 10/23/2015 [id number]

## 2015-10-22 NOTE — H&P (Addendum)
[facility] History and Physical  Sarah Alvarez [id number] DOB: 1951-05-12 DOA: 10/11/2015  Referring physician:Dr. Venora Maples PCP: Minerva Ends, MD   Chief Complaint: Abd pain  HPI: Sarah Alvarez is a 65 y.o. female past medical history left common carotid stenting, right carotid endarterectomy, chronic mesenteric ischemia, dyslipidemia ongoing tobacco abuse and diabetes mellitus type 2 with a last hemoglobin A1c of 7.2 recently seen in the hospital for possible UTI discharge home came back to the hospital complaining of ongoing nausea vomiting diarrhea and diffuse abdominal pain with no radiation. She relates the antibiotics given to her in the ED did not work. She relates that she applies pressure to her abdomen to improve her pain, she relates that every time she eats she would throw up, and that her diarrhea is usually at night. Her urine culture from 10/20/2015 grew 100,000 colonies of gram-negative rods. So we are consulted for further evaluation.  In the ED: CT scan of the abdomen and pelvis show as below high-grade stenosis.  Review of Systems:  Constitutional:  No weight loss, night sweats, Fevers, chills, fatigue.  HEENT:  No headaches, Difficulty swallowing,Tooth/dental problems,Sore throat,  No sneezing, itching, ear ache, nasal congestion, post nasal drip,  Cardio-vascular:  No chest pain, Orthopnea, PND, swelling in lower extremities, anasarca, dizziness, palpitations  GI:  No heartburn, indigestion, change in bowel habits, loss of appetite  Resp:  No shortness of breath with exertion or at rest. No excess mucus, no productive cough, No non-productive cough, No coughing up of blood.No change in color of mucus.No wheezing.No chest wall deformity  Skin:  no rash or lesions.  GU:  no dysuria, change in color of urine, no urgency or frequency. No flank pain.  Musculoskeletal:  No joint pain or swelling. No decreased range of motion. No back pain.  Psych:    No change in mood or affect. No depression or anxiety. No memory loss.   Past Medical History  Diagnosis Date  . Staph infection   . Restless leg syndrome   . Chronic mesenteric ischemia (HCC)     s/p SMA stent 06/22/13  . Coronary artery disease   . Hypertension   . Hyperlipidemia   . Heart murmur   . Asthma     "mild touch" (02/11/2015)  . Anemia   . GERD (gastroesophageal reflux disease)   . Chronic lower back pain   . Type II diabetes mellitus ([place])   . Diabetic peripheral neuropathy ([place])     "in my legs" (02/11/2015)  . Daily headache   . Arthritis     "hands" (02/25/2015)   Past Surgical History  Procedure Laterality Date  . Endarterectomy Right 07/19/2013    Procedure: ENDARTERECTOMY CAROTID-RIGHT;  Surgeon: Angelia Mould, MD;  Location: [place];  Service: Vascular;  Laterality: Right;  . Insertion of retrograde carotid stent Left 03/05/2014    Procedure: 1)Cutdown of open exposure left common carotid; 2) Retrograde left common carotid stent; 3) Repair of left common carotid artery.;  Surgeon: Elam Dutch, MD;  Location: [place];  Service: Vascular;  Laterality: Left;  . Arch aortogram N/A 02/11/2014    Procedure: ARCH AORTOGRAM;  Surgeon: Angelia Mould, MD;  Location: [facility] CATH LAB;  Service: Cardiovascular;  Laterality: N/A;  . Left heart catheterization with coronary angiogram N/A 10/03/2014    Procedure: LEFT HEART CATHETERIZATION WITH CORONARY ANGIOGRAM;  Surgeon: Burnell Blanks, MD;  Location: [facility] CATH LAB;  Service: Cardiovascular;  Laterality: N/A;  .  Cataract extraction w/ intraocular lens implant Left 02/03/2015  . Tonsillectomy  1950's  . Finger fracture surgery Left 2015    "they had to rebreak it and put pins in it"  . Fracture surgery    . Abdominal hysterectomy    . Coronary angioplasty      ? LCX stent ~ 2000 in [place], [place] with LCX stent [facility] 09/1999 for reported re-instent stenosis  . Esophagogastroduodenoscopy (egd) with propofol Left  02/13/2015    Procedure: ESOPHAGOGASTRODUODENOSCOPY (EGD) WITH PROPOFOL;  Surgeon: Ronald Lobo, MD;  Location: [place];  Service: Endoscopy;  Laterality: Left;  . Colonoscopy with propofol N/A 02/14/2015    Procedure: COLONOSCOPY WITH PROPOFOL;  Surgeon: Ronald Lobo, MD;  Location: [facility] ENDOSCOPY;  Service: Endoscopy;  Laterality: N/A;  . Laparoscopic cholecystectomy    . Givens capsule study N/A 02/27/2015    Procedure: GIVENS CAPSULE STUDY;  Surgeon: Wilford Corner, MD;  Location: [facility] ENDOSCOPY;  Service: Endoscopy;  Laterality: N/A;   Social History:  reports that she quit smoking about 2 years ago. Her smoking use included Cigarettes. She has a 20 pack-year smoking history. She has never used smokeless tobacco. She reports that she does not drink alcohol or use illicit drugs.  No Known Allergies  Family History  Problem Relation Age of Onset  . Diabetes Mother   . Stroke Mother   . Hyperlipidemia Mother   . Heart disease Mother   . Hypertension Mother   . Heart attack Mother   . Cancer Father   . Heart disease Father     PVD and  CAROTID  . Heart attack Father   . Deep vein thrombosis Father   . Hyperlipidemia Father   . Hypertension Father   . Diabetes Sister   . Hypertension Sister   . Heart disease Sister     Before age 26  . Hyperlipidemia Sister   . Varicose Veins Sister   . Cancer Brother   . Cancer Maternal Aunt     breast cancer   . Diabetes Maternal Grandmother   . Diabetes Paternal Grandmother     Prior to Admission medications   Medication Sig Start Date End Date Taking? Authorizing Provider  acetaminophen (TYLENOL) 650 MG CR tablet Take 1 tablet (650 mg total) by mouth every 8 (eight) hours as needed for pain. 03/27/15  Yes Josalyn Funches, MD  atorvastatin (LIPITOR) 20 MG tablet Take 1 tablet (20 mg total) by mouth daily. 03/27/15  Yes Josalyn Funches, MD  benazepril (LOTENSIN) 10 MG tablet Take 1 tablet (10 mg total) by mouth daily. 03/27/15  Yes  Josalyn Funches, MD  cephALEXin (KEFLEX) 500 MG capsule Take 1 capsule (500 mg total) by mouth 2 (two) times daily. 10/20/15  Yes Marella Chimes, PA-C  clopidogrel (PLAVIX) 75 MG tablet TAKE 1 TABLET (75 MG TOTAL) BY MOUTH DAILY WITH BREAKFAST. 08/07/15  Yes Josalyn Funches, MD  cyclobenzaprine (FLEXERIL) 5 MG tablet TAKE 1 TABLET (5 MG TOTAL) BY MOUTH AT BEDTIME. 08/07/15  Yes Josalyn Funches, MD  DULoxetine (CYMBALTA) 30 MG capsule Take 1 capsule (30 mg total) by mouth daily. 05/27/15  Yes Josalyn Funches, MD  famotidine (PEPCID) 20 MG tablet TAKE 1 TABLET BY MOUTH 2 TIMES DAILY. 06/17/15  Yes Tresa Garter, MD  ferrous sulfate 325 (65 FE) MG tablet Take 1 tablet (325 mg total) by mouth daily with breakfast. 03/27/15  Yes Josalyn Funches, MD  gabapentin (NEURONTIN) 300 MG capsule Take 1 capsule (300 mg total) by mouth  3 (three) times daily. 03/27/15  Yes Josalyn Funches, MD  glipiZIDE (GLUCOTROL) 10 MG tablet TAKE 1 TABLET BY MOUTH 2 TIMES DAILY BEFORE A MEAL. 08/07/15  Yes Josalyn Funches, MD  Insulin Glargine (LANTUS SOLOSTAR) 100 UNIT/ML Solostar Pen Inject 65 Units into the skin at bedtime. 05/27/15  Yes Josalyn Funches, MD  loratadine (CLARITIN) 10 MG tablet Take 1 tablet (10 mg total) by mouth daily as needed for allergies or rhinitis. 03/27/15  Yes Josalyn Funches, MD  ondansetron (ZOFRAN ODT) 4 MG disintegrating tablet Take 1 tablet (4 mg total) by mouth every 8 (eight) hours as needed for nausea. 10/20/15  Yes Marella Chimes, PA-C  oxymetazoline (AFRIN NASAL SPRAY) 0.05 % nasal spray Place 1 spray into both nostrils 2 (two) times daily as needed for congestion. 01/22/14  Yes Deepak Advani, MD  pantoprazole (PROTONIX) 40 MG tablet Take 1 tablet (40 mg total) by mouth daily. 03/27/15  Yes Josalyn Funches, MD  sitaGLIPtin-metformin (JANUMET) 50-1000 MG tablet Take 1 tablet by mouth daily. 05/27/15  Yes Josalyn Funches, MD  Vitamin D, Ergocalciferol, (DRISDOL) 50000 UNITS CAPS capsule  Take 50,000 Units by mouth every 7 (seven) days. sunday   Yes Historical Provider, MD  VOLTAREN 1 % GEL APPLY 4 GRAMS TOPICALLY 4 TIMES DAILY. 08/07/15  Yes Josalyn Funches, MD  ACCU-CHEK AVIVA PLUS test strip USE AS INSTRUCTED 09/30/15   Tresa Garter, MD  glucose blood (ACCU-CHEK AVIVA) test strip 1 each by Other route 3 (three) times daily. Use as instructed 03/03/15   Boykin Nearing, MD  Lancets (ACCU-CHEK MULTICLIX) lancets Use as instructed 06/19/14   Lorayne Marek, MD   Physical Exam: Filed Vitals:   10/11/2015 1117 11/07/2015 1519  BP:  170/84  Pulse:  119  Resp:  20  SpO2: 98% 98%    Wt Readings from Last 3 Encounters:  08/06/15 79.833 kg (176 lb)  05/29/15 83.008 kg (183 lb)  05/27/15 82.101 kg (181 lb)    General:  Appears calm and comfortable Eyes: PERRL, normal lids, irises & conjunctiva ENT: grossly normal hearing, lips & tongue Neck: no LAD, masses or thyromegaly Cardiovascular: RRR, no m/r/g. No LE edema. Telemetry: SR, no arrhythmias  Respiratory: CTA bilaterally, no w/r/r. Normal respiratory effort. Abdomen: softDiffuse tenderness no rebound or guarding. rash or induration seen on limited exam Musculoskeletal: grossly normal tone BUE/BLE Psychiatric: grossly normal mood and affect, speech fluent and appropriate Neurologic: grossly non-focal.          Labs on Admission:  Basic Metabolic Panel:  Recent Labs Lab 10/20/15 1937 10/30/2015 1158  NA 132* 137  K 4.3 4.3  CL 98* 100*  CO2 20* 18*  GLUCOSE 323* 276*  BUN 16 15  CREATININE 0.76 0.69  CALCIUM 9.0 9.3   Liver Function Tests:  Recent Labs Lab 10/20/15 1937 11/02/2015 1158  AST 22 51*  ALT 16 44  ALKPHOS 88 114  BILITOT 1.2 1.5*  PROT 6.8 7.0  ALBUMIN 4.0 3.9    Recent Labs Lab 10/20/15 1937 10/18/2015 1158  LIPASE 27 25   No results for input(s): AMMONIA in the last 168 hours. CBC:  Recent Labs Lab 10/20/15 1937 11/01/2015 1158  WBC 17.5* 15.8*  NEUTROABS  --  14.6*  HGB  10.1* 10.6*  HCT 30.5* 31.9*  MCV 86.9 86.9  PLT 389 433*   Cardiac Enzymes: No results for input(s): CKTOTAL, CKMB, CKMBINDEX, TROPONINI in the last 168 hours.  BNP (last 3 results) No results for input(s): BNP in  the last 8760 hours.  ProBNP (last 3 results) No results for input(s): PROBNP in the last 8760 hours.  CBG:  Recent Labs Lab 10/20/15 1959 10/20/15 2220  GLUCAP 314* 301*    Radiological Exams on Admission: Ct Abdomen Pelvis W Contrast  10/23/2015  CLINICAL DATA:  Severe right lower quadrant pain with vomiting for 4 days. Chronic mesenteric ischemia EXAM: CT ABDOMEN AND PELVIS WITH CONTRAST TECHNIQUE: Multidetector CT imaging of the abdomen and pelvis was performed using the standard protocol following bolus administration of intravenous contrast. CONTRAST:  171mL ISOVUE-300 IOPAMIDOL (ISOVUE-300) INJECTION 61% COMPARISON:  10/20/2015 FINDINGS: Lower chest and abdominal wall:  Extensive coronary atherosclerosis. Hepatobiliary: No focal liver abnormality.Cholecystectomy with normal common bile duct diameter. Pancreas: Generalized atrophy.  No inflammation or duct enlargement Spleen: Diffusely poor enhancement when compared to prior Adrenals/Urinary Tract: Negative adrenals. No hydronephrosis or stone. Unremarkable bladder. Reproductive:Hysterectomy. Stomach/Bowel: Fold thickening involving the proximal small bowel which is nonspecific. There is no signs of bowel necrosis. No appendicitis. Vascular/Lymphatic: Extensive atherosclerosis status post stenting of the celiac and SMA. Based on recent CTA, there is in stent stenosis or occlusion of both the celiac and proximal SMA stents. Today, there is poor opacification of the celiac branches especially when compared to the distal SMA and SMA branches, despite being in the nephrographic/portal venous phase. These vessels do opacify on the excretory phase. There is correlate poor enhancement of the spleen, especially when compared to the  kidneys and prior study. Peritoneal: No ascites or pneumoperitoneum. Musculoskeletal: No acute abnormalities. These results were called by telephone at the time of interpretation on 10/26/2015 at 4:02 pm to Dr. Jola Schmidt , who verbally acknowledged these results. IMPRESSION: 1. 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 This proximal SMA stent occlusion has occurred since January 2017 CTA. Celiac branch opacification and splenic enhancement is poor/delayed, even when compared to CTA 2 days ago. 2. Proximal small bowel fold thickening could ischemic given #1, or from infectious enteritis in this patient with vomiting. 3. History of bacturia.  No signs of pyelonephritis. Electronically Signed   By: Monte Fantasia M.D.   On: 11/02/2015 16:05   Dg Abd 2 Views  11/02/2015  CLINICAL DATA:  Nausea, vomiting and diarrhea since yesterday. Generalized abdominal pain. History of diabetes. EXAM: ABDOMEN - 2 VIEW COMPARISON:  CT 10/20/2015.  Portable chest 03/03/2015. FINDINGS: The bowel gas pattern is normal. There is no free intraperitoneal air. Cholecystectomy clips and vascular stents are noted. There are no suspicious abdominal calcifications. Mild atelectasis is present at both lung bases. IMPRESSION: No active abdominal findings. Electronically Signed   By: Richardean Sale M.D.   On: 11/07/2015 13:38   Ct Angio Abd/pel W/ And/or W/o  10/20/2015  CLINICAL DATA:  Acute onset of nausea vomiting and diarrhea. Upper abdominal pain. Hyperglycemia. Initial encounter. EXAM: CTA ABDOMEN AND PELVIS wITHOUT AND WITH CONTRAST TECHNIQUE: Multidetector CT imaging of the abdomen and pelvis was performed using the standard protocol during bolus administration of intravenous contrast. Multiplanar reconstructed images and MIPs were obtained and reviewed to evaluate the vascular anatomy. CONTRAST:  100 mL of Isovue 370 IV contrast COMPARISON:  CT of the abdomen and pelvis from 08/06/2015 FINDINGS: The  visualized lung bases are clear. The liver and spleen are unremarkable in appearance. The patient is status post cholecystectomy, with clips noted at the gallbladder fossa. The pancreas and adrenal glands are unremarkable. The kidneys are unremarkable in appearance. There is no evidence of hydronephrosis. No  renal or ureteral stones are seen. No perinephric stranding is appreciated. No free fluid is identified. The small bowel is unremarkable in appearance. The stomach is within normal limits. No acute vascular abnormalities are seen. Scattered calcification is noted along the abdominal aorta and its branches. Vascular stents are noted at the celiac trunk and superior mesenteric artery. The appendix is normal in caliber, without evidence of appendicitis. The bladder is moderately distended and grossly unremarkable. The left ovary is grossly unremarkable. No suspicious adnexal masses are seen. No inguinal lymphadenopathy is seen. No acute osseous abnormalities are identified. Review of the MIP images confirms the above findings. IMPRESSION: 1. No acute abnormality seen within the abdomen or pelvis. 2. Scattered calcification along the abdominal aorta and its branches. Electronically Signed   By: Garald Balding M.D.   On: 10/20/2015 21:53    EKG: Independently reviewed. none  Assessment/Plan Abdominal pain, acute: Is unclear but the differential would include definitely a urinary tract infection so we'll start her on empiric Rocephin. Her urine cultures from 10/20/2015 are pending. Also nonocclusive mesenteric ischemia is in the differential. As she is a vasculopath I will hold all her antihypertensive medications. Start on IV fluid hydration. Try to keep her blood pressure elevated above 120/80. Will allow a blood pressure up to 180. Has a mild metabolic acidosis will check a lactate and an LDH.  Avoid hypotensive episodes, place her nothing by mouth and start her on a  PCA pump.  I will continue IV  Rocephin until urine cultures are back have a low likelihood of the same antibiotics if her symptoms do not improve. I will order a CT in June of the abdomen and pelvis 4 10/23/2015 to allow the contrast given today to clear, as she is a diabetic on metformin.  Chronic mesenteric ischemia ([place]): Continue Plavix  DM (diabetes mellitus) type II uncontrolled, periph vascular disorder [facility]):  Place nothing by mouth, put her on half dose of long-acting insulin and CBGs every 4+ sliding scale insulin.  Hold oral hypoglycemic agent and hydrate as she has been taking metformin.  Essential hypertension, benign:  DC all her anti-hypertensive medication.   PAD (peripheral artery disease) (HCC) Cont Plavix  Code Status: full DVT Prophylaxis:heparin Family Communication: none Disposition Plan: inpatient  Time spent: 75 min  Charlynne Cousins [facility] Pager [phone]

## 2015-10-23 ENCOUNTER — Inpatient Hospital Stay (HOSPITAL_COMMUNITY): Payer: Medicaid Other

## 2015-10-23 ENCOUNTER — Encounter (HOSPITAL_COMMUNITY): Payer: Self-pay | Admitting: Radiology

## 2015-10-23 DIAGNOSIS — E1151 Type 2 diabetes mellitus with diabetic peripheral angiopathy without gangrene: Secondary | ICD-10-CM

## 2015-10-23 DIAGNOSIS — I739 Peripheral vascular disease, unspecified: Secondary | ICD-10-CM

## 2015-10-23 DIAGNOSIS — E872 Acidosis: Secondary | ICD-10-CM

## 2015-10-23 DIAGNOSIS — J9602 Acute respiratory failure with hypercapnia: Secondary | ICD-10-CM

## 2015-10-23 DIAGNOSIS — K551 Chronic vascular disorders of intestine: Secondary | ICD-10-CM

## 2015-10-23 DIAGNOSIS — R100 Acute abdomen: Secondary | ICD-10-CM

## 2015-10-23 DIAGNOSIS — I8290 Acute embolism and thrombosis of unspecified vein: Secondary | ICD-10-CM | POA: Diagnosis present

## 2015-10-23 DIAGNOSIS — E1165 Type 2 diabetes mellitus with hyperglycemia: Secondary | ICD-10-CM

## 2015-10-23 DIAGNOSIS — G9340 Encephalopathy, unspecified: Secondary | ICD-10-CM

## 2015-10-23 DIAGNOSIS — R69 Illness, unspecified: Secondary | ICD-10-CM

## 2015-10-23 DIAGNOSIS — K55059 Acute (reversible) ischemia of intestine, part and extent unspecified: Secondary | ICD-10-CM

## 2015-10-23 DIAGNOSIS — K7200 Acute and subacute hepatic failure without coma: Secondary | ICD-10-CM

## 2015-10-23 LAB — CBC
HEMATOCRIT: 32.6 % — AB (ref 36.0–46.0)
Hemoglobin: 10.5 g/dL — ABNORMAL LOW (ref 12.0–15.0)
MCH: 28.6 pg (ref 26.0–34.0)
MCHC: 32.2 g/dL (ref 30.0–36.0)
MCV: 88.8 fL (ref 78.0–100.0)
PLATELETS: 371 10*3/uL (ref 150–400)
RBC: 3.67 MIL/uL — AB (ref 3.87–5.11)
RDW: 15 % (ref 11.5–15.5)
WBC: 22.5 10*3/uL — AB (ref 4.0–10.5)

## 2015-10-23 LAB — POCT I-STAT, CHEM 8
BUN: 28 mg/dL — AB (ref 6–20)
CREATININE: 1.4 mg/dL — AB (ref 0.44–1.00)
Calcium, Ion: 1.17 mmol/L (ref 1.13–1.30)
Chloride: 112 mmol/L — ABNORMAL HIGH (ref 101–111)
GLUCOSE: 85 mg/dL (ref 65–99)
HCT: 29 % — ABNORMAL LOW (ref 36.0–46.0)
HEMOGLOBIN: 9.9 g/dL — AB (ref 12.0–15.0)
Potassium: 4.4 mmol/L (ref 3.5–5.1)
Sodium: 140 mmol/L (ref 135–145)
TCO2: 12 mmol/L (ref 0–100)

## 2015-10-23 LAB — HEPATIC FUNCTION PANEL
ALBUMIN: 3.3 g/dL — AB (ref 3.5–5.0)
ALT: 4161 U/L — ABNORMAL HIGH (ref 14–54)
AST: 9095 U/L — ABNORMAL HIGH (ref 15–41)
Alkaline Phosphatase: 258 U/L — ABNORMAL HIGH (ref 38–126)
BILIRUBIN TOTAL: 0.9 mg/dL (ref 0.3–1.2)
Bilirubin, Direct: 0.4 mg/dL (ref 0.1–0.5)
Indirect Bilirubin: 0.5 mg/dL (ref 0.3–0.9)
TOTAL PROTEIN: 5.9 g/dL — AB (ref 6.5–8.1)

## 2015-10-23 LAB — BASIC METABOLIC PANEL
ANION GAP: 15 (ref 5–15)
BUN: 25 mg/dL — AB (ref 6–20)
CO2: 19 mmol/L — ABNORMAL LOW (ref 22–32)
Calcium: 9.3 mg/dL (ref 8.9–10.3)
Chloride: 108 mmol/L (ref 101–111)
Creatinine, Ser: 0.8 mg/dL (ref 0.44–1.00)
GFR calc Af Amer: >60 mL/min (ref >60)
Glucose, Bld: 208 mg/dL — ABNORMAL HIGH (ref 65–99)
POTASSIUM: 4.1 mmol/L (ref 3.5–5.1)
Sodium: 142 mmol/L (ref 135–145)

## 2015-10-23 LAB — POCT I-STAT 3, ART BLOOD GAS (G3+)
ACID-BASE DEFICIT: 10 mmol/L — AB (ref 0.0–2.0)
ACID-BASE DEFICIT: 21 mmol/L — AB (ref 0.0–2.0)
Acid-base deficit: 18 mmol/L — ABNORMAL HIGH (ref 0.0–2.0)
BICARBONATE: 10 meq/L — AB (ref 20.0–24.0)
BICARBONATE: 14 meq/L — AB (ref 20.0–24.0)
Bicarbonate: 16.3 mEq/L — ABNORMAL LOW (ref 20.0–24.0)
O2 SAT: 100 %
O2 SAT: 80 %
O2 Saturation: 90 %
PCO2 ART: 66.7 mmHg — AB (ref 35.0–45.0)
PH ART: 6.924 — AB (ref 7.350–7.450)
PO2 ART: 423 mmHg — AB (ref 80.0–100.0)
PO2 ART: 71 mmHg — AB (ref 80.0–100.0)
TCO2: 11 mmol/L (ref 0–100)
TCO2: 16 mmol/L (ref 0–100)
TCO2: 17 mmol/L (ref 0–100)
pCO2 arterial: 35.2 mmHg (ref 35.0–45.0)
pCO2 arterial: 47.2 mmHg — ABNORMAL HIGH (ref 35.0–45.0)
pH, Arterial: 6.933 — CL (ref 7.350–7.450)
pH, Arterial: 7.27 — ABNORMAL LOW (ref 7.350–7.450)
pO2, Arterial: 94 mmHg (ref 80.0–100.0)

## 2015-10-23 LAB — CBC WITH DIFFERENTIAL/PLATELET
BASOS ABS: 0 10*3/uL (ref 0.0–0.1)
Basophils Relative: 0 %
EOS ABS: 0 10*3/uL (ref 0.0–0.7)
Eosinophils Relative: 0 %
HCT: 28.5 % — ABNORMAL LOW (ref 36.0–46.0)
HEMOGLOBIN: 8.3 g/dL — AB (ref 12.0–15.0)
LYMPHS ABS: 1 10*3/uL (ref 0.7–4.0)
Lymphocytes Relative: 4 %
MCH: 27.8 pg (ref 26.0–34.0)
MCHC: 29.1 g/dL — AB (ref 30.0–36.0)
MCV: 95.3 fL (ref 78.0–100.0)
Monocytes Absolute: 1.3 10*3/uL — ABNORMAL HIGH (ref 0.1–1.0)
Monocytes Relative: 5 %
NEUTROS ABS: 23.1 10*3/uL — AB (ref 1.7–7.7)
Neutrophils Relative %: 91 %
PLATELETS: 264 10*3/uL (ref 150–400)
RBC: 2.99 MIL/uL — ABNORMAL LOW (ref 3.87–5.11)
RDW: 15.2 % (ref 11.5–15.5)
WBC: 25.4 10*3/uL — ABNORMAL HIGH (ref 4.0–10.5)

## 2015-10-23 LAB — LACTIC ACID, PLASMA
LACTIC ACID, VENOUS: 4.8 mmol/L — AB (ref 0.5–2.0)
LACTIC ACID, VENOUS: 12.5 mmol/L — AB (ref 0.5–2.0)
Lactic Acid, Venous: 15.4 mmol/L (ref 0.5–2.0)

## 2015-10-23 LAB — URINE CULTURE: Culture: >=100000 — AB

## 2015-10-23 LAB — GLUCOSE, CAPILLARY
GLUCOSE-CAPILLARY: 144 mg/dL — AB (ref 65–99)
GLUCOSE-CAPILLARY: 73 mg/dL (ref 65–99)
Glucose-Capillary: 200 mg/dL — ABNORMAL HIGH (ref 65–99)
Glucose-Capillary: 124 mg/dL — ABNORMAL HIGH (ref 65–99)
Glucose-Capillary: 86 mg/dL (ref 65–99)

## 2015-10-23 LAB — PROTIME-INR
INR: 2.51 — AB (ref 0.00–1.49)
PROTHROMBIN TIME: 26.8 s — AB (ref 11.6–15.2)

## 2015-10-23 LAB — COMPREHENSIVE METABOLIC PANEL
ALK PHOS: 328 U/L — AB (ref 38–126)
ALT: 3828 U/L — ABNORMAL HIGH (ref 14–54)
AST: 8928 U/L — ABNORMAL HIGH (ref 15–41)
Albumin: 2.5 g/dL — ABNORMAL LOW (ref 3.5–5.0)
Anion gap: 22 — ABNORMAL HIGH (ref 5–15)
BUN: 25 mg/dL — ABNORMAL HIGH (ref 6–20)
CALCIUM: 8.4 mg/dL — AB (ref 8.9–10.3)
CO2: 10 mmol/L — ABNORMAL LOW (ref 22–32)
CREATININE: 1.44 mg/dL — AB (ref 0.44–1.00)
Chloride: 109 mmol/L (ref 101–111)
GFR calc Af Amer: 43 mL/min — ABNORMAL LOW (ref >60)
GFR, EST NON AFRICAN AMERICAN: 37 mL/min — AB (ref >60)
Glucose, Bld: 102 mg/dL — ABNORMAL HIGH (ref 65–99)
Potassium: 4.7 mmol/L (ref 3.5–5.1)
Sodium: 141 mmol/L (ref 135–145)
TOTAL PROTEIN: 4.4 g/dL — AB (ref 6.5–8.1)
Total Bilirubin: 1.1 mg/dL (ref 0.3–1.2)

## 2015-10-23 LAB — MAGNESIUM: MAGNESIUM: 2.2 mg/dL (ref 1.7–2.4)

## 2015-10-23 LAB — TROPONIN I: Troponin I: 0.37 ng/mL — ABNORMAL HIGH (ref <0.031)

## 2015-10-23 LAB — HEMOGLOBIN A1C
Hgb A1c MFr Bld: 10 % — ABNORMAL HIGH (ref 4.8–5.6)
Mean Plasma Glucose: 240 mg/dL

## 2015-10-23 LAB — MRSA PCR SCREENING: MRSA by PCR: NEGATIVE

## 2015-10-23 LAB — PHOSPHORUS: PHOSPHORUS: 7.9 mg/dL — AB (ref 2.5–4.6)

## 2015-10-23 LAB — PROCALCITONIN: Procalcitonin: 6.84 ng/mL

## 2015-10-23 MED ORDER — LIDOCAINE HCL 1 % IJ SOLN
INTRAMUSCULAR | Status: AC
  Filled 2015-10-23: qty 20

## 2015-10-23 MED ORDER — HEPARIN SOD (PORK) LOCK FLUSH 100 UNIT/ML IV SOLN
INTRAVENOUS | Status: AC
  Filled 2015-10-23: qty 10

## 2015-10-23 MED ORDER — ONDANSETRON HCL 4 MG PO TABS: 4.0000 mg | ORAL_TABLET | Freq: Four times a day (QID) | ORAL | Status: DC | PRN

## 2015-10-23 MED ORDER — SODIUM BICARBONATE 8.4 % IV SOLN
INTRAVENOUS | Status: AC
  Filled 2015-10-23: qty 100

## 2015-10-23 MED ORDER — PIPERACILLIN-TAZOBACTAM 3.375 G IVPB
3.3750 g | Freq: Three times a day (TID) | INTRAVENOUS | Status: DC
  Administered 2015-10-24: 3.375 g via INTRAVENOUS
  Filled 2015-10-23 (×4): qty 50

## 2015-10-23 MED ORDER — SODIUM CHLORIDE 0.9 % IV SOLN
8.0000 mg | Freq: Four times a day (QID) | INTRAVENOUS | Status: DC | PRN
  Filled 2015-10-23 (×2): qty 4

## 2015-10-23 MED ORDER — MIDAZOLAM HCL 2 MG/2ML IJ SOLN
INTRAMUSCULAR | Status: AC | PRN
  Administered 2015-10-23 (×3): 1 mg via INTRAVENOUS

## 2015-10-23 MED ORDER — SODIUM CHLORIDE 0.9 % IV BOLUS (SEPSIS)
1000.0000 mL | Freq: Once | INTRAVENOUS | Status: AC
  Administered 2015-10-23: 1000 mL via INTRAVENOUS

## 2015-10-23 MED ORDER — HEPARIN SODIUM (PORCINE) 5000 UNIT/ML IJ SOLN
5000.0000 [IU] | Freq: Three times a day (TID) | INTRAMUSCULAR | Status: DC
Start: 2015-10-23 — End: 2015-10-23

## 2015-10-23 MED ORDER — IOHEXOL 300 MG/ML  SOLN
150.0000 mL | Freq: Once | INTRAMUSCULAR | Status: AC | PRN
  Administered 2015-10-23: 170 mL via INTRAVENOUS

## 2015-10-23 MED ORDER — HEPARIN SODIUM (PORCINE) 1000 UNIT/ML IJ SOLN
INTRAMUSCULAR | Status: AC
  Administered 2015-10-23: 4000 [IU] via INTRAVENOUS
  Filled 2015-10-23: qty 1

## 2015-10-23 MED ORDER — DEXTROSE 5 % IV SOLN
30.0000 ug/min | INTRAVENOUS | Status: DC
  Administered 2015-10-24: 110 ug/min via INTRAVENOUS
  Administered 2015-10-24: 10 ug/min via INTRAVENOUS
  Filled 2015-10-23 (×2): qty 1

## 2015-10-23 MED ORDER — HEPARIN SOD (PORK) LOCK FLUSH 100 UNIT/ML IV SOLN
INTRAVENOUS | Status: AC
  Filled 2015-10-23: qty 20

## 2015-10-23 MED ORDER — NALOXONE HCL 2 MG/2ML IJ SOSY
0.5000 mg | PREFILLED_SYRINGE | Freq: Once | INTRAMUSCULAR | Status: DC
  Filled 2015-10-23: qty 2

## 2015-10-23 MED ORDER — STERILE WATER FOR INJECTION IV SOLN
INTRAVENOUS | Status: DC
  Administered 2015-10-23 – 2015-10-24 (×2): via INTRAVENOUS
  Filled 2015-10-23 (×5): qty 850

## 2015-10-23 MED ORDER — HYDROMORPHONE HCL 1 MG/ML IJ SOLN
INTRAMUSCULAR | Status: AC
  Filled 2015-10-23: qty 2

## 2015-10-23 MED ORDER — PIPERACILLIN-TAZOBACTAM 3.375 G IVPB 30 MIN
3.3750 g | Freq: Once | INTRAVENOUS | Status: DC
  Filled 2015-10-23 (×2): qty 50

## 2015-10-23 MED ORDER — NOREPINEPHRINE BITARTRATE 1 MG/ML IV SOLN
0.0000 ug/min | INTRAVENOUS | Status: DC
  Administered 2015-10-23: 15 ug/min via INTRAVENOUS
  Administered 2015-10-23: 5 ug/min via INTRAVENOUS
  Filled 2015-10-23 (×3): qty 4

## 2015-10-23 MED ORDER — HEPARIN (PORCINE) IN NACL 100-0.45 UNIT/ML-% IJ SOLN
1400.0000 [IU]/h | INTRAMUSCULAR | Status: DC
Start: 2015-10-23 — End: 2015-10-24
  Administered 2015-10-23 (×2): 1150 [IU]/h via INTRAVENOUS
  Administered 2015-10-24: 1400 [IU]/h via INTRAVENOUS
  Filled 2015-10-23 (×4): qty 250

## 2015-10-23 MED ORDER — FENTANYL CITRATE (PF) 100 MCG/2ML IJ SOLN
INTRAMUSCULAR | Status: AC
  Filled 2015-10-23: qty 4

## 2015-10-23 MED ORDER — SODIUM CHLORIDE 0.9 % IV SOLN: 250.0000 mL | INTRAVENOUS | Status: DC | PRN

## 2015-10-23 MED ORDER — MIDAZOLAM HCL 2 MG/2ML IJ SOLN
INTRAMUSCULAR | Status: AC
  Filled 2015-10-23: qty 4

## 2015-10-23 MED ORDER — NALOXONE HCL 0.4 MG/ML IJ SOLN
INTRAMUSCULAR | Status: AC
Start: 2015-10-23 — End: 2015-10-24
  Filled 2015-10-23: qty 2

## 2015-10-23 MED ORDER — SODIUM CHLORIDE 0.9 % IV SOLN
INTRAVENOUS | Status: DC
  Administered 2015-10-23: 100 mL via INTRAVENOUS
  Administered 2015-10-24: 07:00:00 via INTRAVENOUS

## 2015-10-23 MED ORDER — SODIUM CHLORIDE 0.9 % IV SOLN: INTRAVENOUS | Status: DC

## 2015-10-23 MED ORDER — HEPARIN SODIUM (PORCINE) 5000 UNIT/ML IJ SOLN: 5000.0000 [IU] | Freq: Three times a day (TID) | INTRAMUSCULAR | Status: DC

## 2015-10-23 MED ORDER — ONDANSETRON HCL 4 MG/2ML IJ SOLN
INTRAMUSCULAR | Status: AC
  Administered 2015-10-23: 4 mg via INTRAVENOUS
  Filled 2015-10-23: qty 2

## 2015-10-23 MED ORDER — SODIUM BICARBONATE 8.4 % IV SOLN
100.0000 meq | Freq: Once | INTRAVENOUS | Status: AC
  Administered 2015-10-23: 100 meq via INTRAVENOUS

## 2015-10-23 MED ORDER — HEPARIN SODIUM (PORCINE) 1000 UNIT/ML IJ SOLN
4000.0000 [IU] | Freq: Once | INTRAMUSCULAR | Status: AC
  Administered 2015-10-23: 4000 [IU] via INTRAVENOUS

## 2015-10-23 MED ORDER — NALOXONE HCL 0.4 MG/ML IJ SOLN
INTRAMUSCULAR | Status: AC
  Filled 2015-10-23: qty 1

## 2015-10-23 MED ORDER — INSULIN ASPART 100 UNIT/ML ~~LOC~~ SOLN
0.0000 [IU] | SUBCUTANEOUS | Status: DC
Start: 2015-10-23 — End: 2015-10-24

## 2015-10-23 MED ORDER — IOHEXOL 350 MG/ML SOLN
100.0000 mL | Freq: Once | INTRAVENOUS | Status: AC | PRN
  Administered 2015-10-23: 100 mL via INTRAVENOUS

## 2015-10-23 MED ORDER — SODIUM CHLORIDE 0.9 % IV BOLUS (SEPSIS)
500.0000 mL | INTRAVENOUS | Status: DC | PRN
  Administered 2015-10-24: 500 mL via INTRAVENOUS
  Filled 2015-10-23 (×2): qty 500

## 2015-10-23 MED ORDER — NALOXONE HCL 2 MG/2ML IJ SOSY
1.0000 mg | PREFILLED_SYRINGE | INTRAMUSCULAR | Status: DC | PRN
Start: 2015-10-23 — End: 2015-10-24
  Filled 2015-10-23: qty 2

## 2015-10-23 MED ORDER — HEPARIN SOD (PORK) LOCK FLUSH 100 UNIT/ML IV SOLN: 500.0000 [IU] | Freq: Once | INTRAVENOUS | Status: DC

## 2015-10-23 MED ORDER — DEXTROSE 5 % IV SOLN
0.0000 ug/min | INTRAVENOUS | Status: DC
  Administered 2015-10-24: 2 ug/min via INTRAVENOUS
  Filled 2015-10-23 (×2): qty 16

## 2015-10-23 MED ORDER — HEPARIN SODIUM (PORCINE) 1000 UNIT/ML IJ SOLN
INTRAMUSCULAR | Status: AC | PRN
  Administered 2015-10-23 (×2): 2000 [IU] via INTRAVENOUS

## 2015-10-23 NOTE — Progress Notes (Signed)
Wasted 89mL Dilaudid PCA in trash.Witnessed by Jake Bathe RN.

## 2015-10-23 NOTE — Sedation Documentation (Signed)
Patient is resting comfortably. 

## 2015-10-23 NOTE — Progress Notes (Signed)
CRITICAL VALUE ALERT  Critical value received:  Lactic Acid 3.8  Date of notification:  11/06/2015  Time of notification:  2340  Critical value read back:Yes.    Nurse who received alert:  F. Milagros Loll, RN  MD notified (1st page):  Raliegh Ip Schorr  Time of first page:  2345  MD notified (2nd page):  Time of second page:  Responding MD:  Lamar Blinks  Time MD responded:  [phone]

## 2015-10-23 NOTE — Progress Notes (Addendum)
Inpatient Diabetes Program Recommendations  AACE/ADA: New Consensus Statement on Inpatient Glycemic Control (2015)  Target Ranges:  Prepandial:   less than 140 mg/dL      Peak postprandial:   less than 180 mg/dL (1-2 hours)      Critically ill patients:  140 - 180 mg/dL   Results for Sarah Alvarez, Sarah Alvarez (MRN [id number]) as of 10/23/2015 12:26  Ref. Range 10/31/2015 21:33 10/23/2015 04:07 10/23/2015 10:15 10/23/2015 11:07  Glucose-Capillary Latest Ref Range: 65-99 mg/dL 254 (H) 200 (H) 144 (H) 124 (H)    Admit with: Abd Pain/ Mesenteric Ischemia/ UTI  History: DM2  Home DM Meds: Glipizide 10 mg bid       Janumet 50/1000 daily       Lantus 65 units daily  Current Insulin Orders: Novolog Moderate Correction Scale/ SSI (0-15 units) Q4 hours      -Note patient received 40 units Levemir X 1 dose last night at bedtime.  Levemir has since been discontinued.  -Patient takes fairly large dose of Lantus at home- Lantus 65 units QHS.  -Currently NPO for procedure.     MD- Please consider starting back a portion of patient's home dose of Lantus tonight-  Lantus 30 units QHS (50% home dose)     --Will follow patient during hospitalization--  Wyn Quaker RN, MSN, CDE Diabetes Coordinator Inpatient Glycemic Control Team Team Pager: [phone] (8a-5p)

## 2015-10-23 NOTE — Procedures (Signed)
Placement of right jugular triple lumen catheter.  Tip at SVC/RA junction.  Ready to use.  No immediate complication.

## 2015-10-23 NOTE — Sedation Documentation (Signed)
Dr. Clementeen Hoof at bedside. Pt. A and O x 3. zofran given. Pt. Actively vomitting. Fluid bolus in progress. Referring MD paged.

## 2015-10-23 NOTE — Procedures (Addendum)
Interventional Radiology Procedure Note  Procedure:  Aortic, celiac, SMA and iliac arteriography.  Mechanical thrombectomy of celiac axis and SMA.  Angioplasty and stent placement in celiac axis and SMA.  Complications:  None  Estimated Blood Loss: 200 mL  Findings:  Initial aortogram shows complete occlusion of celiac axis and SMA. Celiac injection shows extensive cast of clot throughout splenic and hepatic arteries.  Celiac stent treated with angioplasty.  Mechanical thrombectomy performed in celiac trunk and splenic artery. Celiac restented with 6 x 19 mm Omnilink stent. SMA stent treated with angioplasty.  Distal brach vessel mechanical thrombectomy performed. SMA restented with 6 x 19 mm Omnilink stent. Better flow through SMA on completion with some thrombus still in branch vessels. Very poor flow in celiac distal to stent on completion.  Dr. Anselm Pancoast to place right IJ central line now for CV access. Right femoral 6 Fr arterial sheath left in place due to high INR.  This may be used as arterial line, if needed.  Will hook up pressure bag to sheath.  Will need aggressive heparinization.  Findings reviewed during procedure with Dr. Scot Dock.     Venetia Night. Kathlene Cote, M.D Pager:  [phone]

## 2015-10-23 NOTE — Progress Notes (Signed)
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 coronary artery disease, developed abdominal pain on 10/18/2015, associated with nausea, vomiting. She had an emergency room visit on 10/20/2015 for abdominal pain at which time a CT angio of abdomen was not reported by radiology to have occlusive disease. She presented back to the emergency room on 10/11/2015 with complaints of worsening abdominal pain. A repeat CT scan revealed a high-grade in-stent stenosis/occlusion of the celiac and SMA stents. She was admitted to the hospital becoming acutely worse overnight. On my evaluation this morning she appeared ill, having severe abdominal discomfort, having ongoing nausea vomiting, requiring IV narcotic analgesics. Labs reviewed patient having upward trend her lactate from 3.1 on admission to 4.8. Gen. surgery was consulted as well as interventional radiology. She underwent a STAT CT angiogram of abdomen on 10/23/2015 which possibly revealed a fully occluded SMA. After discussion with interventional radiology and general surgery she was transferred to stepdown unit at [facility] to be evaluated by vascular surgery. I spoke with Dr. Sherral Hammers at [facility] who has agreed to accept patient in transfer. I have spoken with Sarah Alvarez her sister updated her on Sarah. Alvarez's condition.  Assessment/Plan: 1. Mesenteric ischemia -Sarah. [name] is a 65 year old female with a history of tobacco abuse, diabetes mellitus, having a known history of chronic mesenteric ischemia undergoing SMA stenting on 06/15/2013 and restenting of SMA and proximal celiac  in June 2015. Prior to presentation she has been on Plavix therapy. -She presented with worsening abdominal pain  -On admission she had a CT interpreted by radiology to have a high-grade in-stent stenosis/occlusion of the celiac and SMA stents. -Because of worsening symptoms a stat CT Angio was done this morning showing evidence of hepatic ischemia along with affected bowel. -Labs reviewed, she had upward trend in white count from 15,800 to 22,500. Lactic acid also trending up from 3.1 to 4.8 this morning.  -I discussed case with interventional radiology and general surgery, who recommended immediate transfer to [facility] to be evaluated by vascular surgery to assess for revascularization options. Patient has bed on 2H -Will check a PT/INR, liver function tests, stop Plavix, keep her NPO, give 1 L bolus of normal saline now. Change Ceftriaxone to IV Zosyn. Await further recommendations.  -For pain management she is on Dilaudid PCA  2.  Diabetes mellitus. -She is currently NPO, will discontinue Lantus. Provide Accu-Cheks every 4 hours with sliding scale coverage.  3.  History of hypertension. -Blood pressures are soft, antihypertensive agents discontinued receiving boluses of normal saline.  4.  Possible urinary tract infection. -She is being covered with IV Zosyn  Code Status: Full code Family Communication: I spoke with her sister Sarah Alvarez updated her on patient's condition Disposition Plan: Transfer to [facility] stepdown unit   Consultants:  Interventional radiology  General surgery  Antibiotics:  IV Zosyn started on 10/23/2015  HPI/Subjective: Ill-appearing, having severe abdominal pain, actively vomiting  Objective: Filed Vitals:   10/23/15 0440 10/23/15 0859  BP: 97/54 103/59  Pulse: 112 115  Temp: 98.7 F (37.1 C) 97.7 F (36.5 C)  Resp: 18 20    Intake/Output Summary (Last 24  hours) at 10/23/15 0931 Last data filed at 10/23/15 0600  Gross per  24 hour  Intake 1332.5 ml  Output      2 ml  Net 1330.5 ml   Filed Weights   10/18/2015 1755  Weight: 75 kg (165 lb 5.5 oz)    Exam:   General:  Ill-appearing, actively vomiting, she is awake and alert and can follow commands and answer questions  Cardiovascular: Tachycardic  Respiratory: Normal respiratory effort lungs are clear  Abdomen: She currently does not have peritoneal signs however there is severe pain with palpation across generalized abdomen  Musculoskeletal: No edema  Data Reviewed: Basic Metabolic Panel:  Recent Labs Lab 10/20/15 1937 10/16/2015 1158 10/23/15 0543  NA 132* 137 142  K 4.3 4.3 4.1  CL 98* 100* 108  CO2 20* 18* 19*  GLUCOSE 323* 276* 208*  BUN 16 15 25*  CREATININE 0.76 0.69 0.80  CALCIUM 9.0 9.3 9.3   Liver Function Tests:  Recent Labs Lab 10/20/15 1937 10/19/2015 1158  AST 22 51*  ALT 16 44  ALKPHOS 88 114  BILITOT 1.2 1.5*  PROT 6.8 7.0  ALBUMIN 4.0 3.9    Recent Labs Lab 10/20/15 1937 10/20/2015 1158  LIPASE 27 25   No results for input(s): AMMONIA in the last 168 hours. CBC:  Recent Labs Lab 10/20/15 1937 11/06/2015 1158 10/23/15 0543  WBC 17.5* 15.8* 22.5*  NEUTROABS  --  14.6*  --   HGB 10.1* 10.6* 10.5*  HCT 30.5* 31.9* 32.6*  MCV 86.9 86.9 88.8  PLT 389 433* 371   Cardiac Enzymes: No results for input(s): CKTOTAL, CKMB, CKMBINDEX, TROPONINI in the last 168 hours. BNP (last 3 results) No results for input(s): BNP in the last 8760 hours.  ProBNP (last 3 results) No results for input(s): PROBNP in the last 8760 hours.  CBG:  Recent Labs Lab 10/20/15 1959 10/20/15 2220 10/27/2015 2133 10/23/15 0407  GLUCAP 314* 301* 254* 200*    Recent Results (from the past 240 hour(s))  Urine culture     Status: Abnormal (Preliminary result)   Collection Time: 10/20/15  8:12 PM  Result Value Ref Range Status   Specimen Description URINE, RANDOM  Final   Special Requests NONE  Final   Culture >=100,000  COLONIES/mL GRAM NEGATIVE RODS (A)  Final   Report Status PENDING  Incomplete     Studies: Ct Abdomen Pelvis W Contrast  10/12/2015  CLINICAL DATA:  Severe right lower quadrant pain with vomiting for 4 days. Chronic mesenteric ischemia EXAM: CT ABDOMEN AND PELVIS WITH CONTRAST TECHNIQUE: Multidetector CT imaging of the abdomen and pelvis was performed using the standard protocol following bolus administration of intravenous contrast. CONTRAST:  178mL ISOVUE-300 IOPAMIDOL (ISOVUE-300) INJECTION 61% COMPARISON:  10/20/2015 FINDINGS: Lower chest and abdominal wall:  Extensive coronary atherosclerosis. Hepatobiliary: No focal liver abnormality.Cholecystectomy with normal common bile duct diameter. Pancreas: Generalized atrophy.  No inflammation or duct enlargement Spleen: Diffusely poor enhancement when compared to prior Adrenals/Urinary Tract: Negative adrenals. No hydronephrosis or stone. Unremarkable bladder. Reproductive:Hysterectomy. Stomach/Bowel: Fold thickening involving the proximal small bowel which is nonspecific. There is no signs of bowel necrosis. No appendicitis. Vascular/Lymphatic: Extensive atherosclerosis status post stenting of the celiac and SMA. Based on recent CTA, there is in stent stenosis or occlusion of both the celiac and proximal SMA stents. Today, there is poor opacification of the celiac branches especially when compared to the distal SMA and SMA branches, despite being in the nephrographic/portal venous phase. These vessels  do opacify on the excretory phase. There is correlate poor enhancement of the spleen, especially when compared to the kidneys and prior study. Peritoneal: No ascites or pneumoperitoneum. Musculoskeletal: No acute abnormalities. These results were called by telephone at the time of interpretation on 10/27/2015 at 4:02 pm to Dr. Jola Schmidt , who verbally acknowledged these results. IMPRESSION: 1. 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  This proximal SMA stent occlusion has occurred since January 2017 CTA. Celiac branch opacification and splenic enhancement is poor/delayed, even when compared to CTA 2 days ago. 2. Proximal small bowel fold thickening could ischemic given #1, or from infectious enteritis in this patient with vomiting. 3. History of bacturia.  No signs of pyelonephritis. Electronically Signed   By: Monte Fantasia M.D.   On: 10/18/2015 16:05   Dg Abd 2 Views  10/25/2015  CLINICAL DATA:  Nausea, vomiting and diarrhea since yesterday. Generalized abdominal pain. History of diabetes. EXAM: ABDOMEN - 2 VIEW COMPARISON:  CT 10/20/2015.  Portable chest 03/03/2015. FINDINGS: The bowel gas pattern is normal. There is no free intraperitoneal air. Cholecystectomy clips and vascular stents are noted. There are no suspicious abdominal calcifications. Mild atelectasis is present at both lung bases. IMPRESSION: No active abdominal findings. Electronically Signed   By: Richardean Sale M.D.   On: 10/23/2015 13:38    Scheduled Meds: . atorvastatin  20 mg Oral Daily  . DULoxetine  30 mg Oral Daily  . heparin  5,000 Units Subcutaneous 3 times per day  . HYDROmorphone   Intravenous 6 times per day  . insulin aspart  0-15 Units Subcutaneous 6 times per day  . piperacillin-tazobactam  3.375 g Intravenous 3 times per day  . sodium chloride  1,000 mL Intravenous Once   Continuous Infusions:   Principal Problem:   Abdominal pain, acute Active Problems:   DM (diabetes mellitus) type II uncontrolled, periph vascular disorder (HCC)   Essential hypertension, benign   Chronic mesenteric ischemia (HCC)   PAD (peripheral artery disease) ([place])    Time spent: 40 min    Kelvin Cellar  [facility] Pager [phone]. If 7PM-7AM, please contact night-coverage at [email or web address], password [facility] 10/23/2015, 9:31 AM  LOS: 1 day

## 2015-10-23 NOTE — Consult Note (Signed)
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 She initially presented to the emergency department 4/10 with complaints of abdominal pain. CTA done at the time did not describe any occlusive disease and she was discharged to home with ABX for suspected UTI. She came back to ED 4/12 with complaints of worsening abdominal pain with associated nausea and vomiting. She was admitted to the hospitalist team at [facility] and acutely worsened overnight with significantly worse abd pain and rising lactic acid. STAT CT angio of the abdomen was concerning for a fully occluded SMA. She was transferred to [facility] for vascular surgery evaluation. She was taken to the OR for mesenteric arteriogram and underwent angioplasty and stenting to the SMA.  During the procedure she became hypotensive requiring vasoactive infusions, PCCM to see for futher evaluation.  PAST MEDICAL HISTORY :  She  has a past medical history of Staph infection; Restless leg syndrome; Chronic mesenteric ischemia ([place]); Coronary artery disease; Hypertension; Hyperlipidemia; Heart murmur; Asthma; Anemia; GERD (gastroesophageal reflux disease); Chronic lower back pain; Type II diabetes mellitus ([place]); Diabetic peripheral neuropathy ([place]); Daily headache; and Arthritis.  PAST SURGICAL HISTORY: She  has past surgical history that includes Endarterectomy (Right, 07/19/2013); Insertion of retrograde carotid stent (Left, 03/05/2014); arch  aortogram (N/A, 02/11/2014); left heart catheterization with coronary angiogram (N/A, 10/03/2014); Cataract extraction w/ intraocular lens implant (Left, 02/03/2015); Tonsillectomy (1950's); Finger fracture surgery (Left, 2015); Fracture surgery; Abdominal hysterectomy; Coronary angioplasty; Esophagogastroduodenoscopy (egd) with propofol (Left, 02/13/2015); Colonoscopy with propofol (N/A, 02/14/2015); Laparoscopic cholecystectomy; and Givens capsule study (N/A, 02/27/2015).  No Known Allergies  No current facility-administered medications on file prior to encounter.   Current Outpatient Prescriptions on File Prior to Encounter  Medication Sig  . acetaminophen (TYLENOL) 650 MG CR tablet Take 1 tablet (650 mg total) by mouth every 8 (eight) hours as needed for pain.  Marland Kitchen atorvastatin (LIPITOR) 20 MG tablet Take 1 tablet (20 mg total) by mouth daily.  . benazepril (LOTENSIN) 10 MG tablet Take 1 tablet (10 mg total) by mouth daily.  . cephALEXin (KEFLEX) 500 MG capsule Take 1 capsule (500 mg total) by mouth 2 (two) times daily.  . clopidogrel (PLAVIX) 75 MG tablet TAKE 1 TABLET (75 MG TOTAL) BY MOUTH DAILY WITH BREAKFAST.  . cyclobenzaprine (FLEXERIL) 5 MG tablet TAKE 1 TABLET (5 MG TOTAL) BY MOUTH AT BEDTIME.  . DULoxetine (CYMBALTA) 30 MG capsule Take 1 capsule (30 mg total) by mouth daily.  . famotidine (PEPCID) 20 MG tablet TAKE 1 TABLET BY MOUTH 2 TIMES DAILY.  . ferrous sulfate 325 (65 FE) MG tablet Take 1 tablet (325 mg total) by mouth daily with breakfast.  . gabapentin (NEURONTIN) 300 MG capsule Take 1 capsule (300 mg total) by mouth 3 (three) times daily.  Marland Kitchen glipiZIDE (GLUCOTROL) 10 MG tablet TAKE 1 TABLET BY MOUTH 2 TIMES DAILY BEFORE A MEAL.  Marland Kitchen Insulin Glargine (LANTUS SOLOSTAR) 100 UNIT/ML Solostar Pen Inject 65 Units into the skin at bedtime.  Marland Kitchen loratadine (CLARITIN) 10 MG tablet  Take 1 tablet (10 mg total) by mouth daily as needed for allergies or rhinitis.  Marland Kitchen ondansetron (ZOFRAN ODT) 4 MG  disintegrating tablet Take 1 tablet (4 mg total) by mouth every 8 (eight) hours as needed for nausea.  Marland Kitchen oxymetazoline (AFRIN NASAL SPRAY) 0.05 % nasal spray Place 1 spray into both nostrils 2 (two) times daily as needed for congestion.  . pantoprazole (PROTONIX) 40 MG tablet Take 1 tablet (40 mg total) by mouth daily.  . sitaGLIPtin-metformin (JANUMET) 50-1000 MG tablet Take 1 tablet by mouth daily.  . Vitamin D, Ergocalciferol, (DRISDOL) 50000 UNITS CAPS capsule Take 50,000 Units by mouth every 7 (seven) days. sunday  . VOLTAREN 1 % GEL APPLY 4 GRAMS TOPICALLY 4 TIMES DAILY.  Marland Kitchen ACCU-CHEK AVIVA PLUS test strip USE AS INSTRUCTED  . glucose blood (ACCU-CHEK AVIVA) test strip 1 each by Other route 3 (three) times daily. Use as instructed  . Lancets (ACCU-CHEK MULTICLIX) lancets Use as instructed  . [DISCONTINUED] colchicine 0.6 MG tablet Take two tab PO, then take 1 tab PO 1 hour later    FAMILY HISTORY:  Her indicated that her mother is deceased. She indicated that her father is deceased. She indicated that her sister is alive. She indicated that her brother is deceased. She indicated that the status of her other is unknown.   SOCIAL HISTORY: She  reports that she quit smoking about 2 years ago. Her smoking use included Cigarettes. She has a 20 pack-year smoking history. She has never used smokeless tobacco. She reports that she does not drink alcohol or use illicit drugs.  REVIEW OF SYSTEMS:     SUBJECTIVE:    VITAL SIGNS: BP 82/52 mmHg  Pulse 112  Temp(Src) 97 F (36.1 C) (Oral)  Resp 18  Ht 5\' 3"  (1.6 m)  Wt 75 kg (165 lb 5.5 oz)  BMI 29.30 kg/m2  SpO2 93%  HEMODYNAMICS:    VENTILATOR SETTINGS:    INTAKE / OUTPUT: I/O last 3 completed shifts: In: 1332.5 [I.V.:832.5; IV Piggyback:500] Out: 2 [Urine:2]  PHYSICAL EXAMINATION: General:  Obese female in NAD Neuro:  Obtunded, minimal response to tactile stimuli HEENT:  [place]/AT, PERRL, no JVD Cardiovascular:  Tachy,  regular, no MRG Lungs:  Clear bilateral breath sounds, poor effort Abdomen:  Soft, nontender, nondistended Musculoskeletal:  No acute deformity, peripheral pulses intact. Good cap refill to both feet.  Skin:  Grossly intact  LABS:  BMET  Recent Labs Lab 10/20/15 1937 10/27/2015 1158 10/23/15 0543  NA 132* 137 142  K 4.3 4.3 4.1  CL 98* 100* 108  CO2 20* 18* 19*  BUN 16 15 25*  CREATININE 0.76 0.69 0.80  GLUCOSE 323* 276* 208*    Electrolytes  Recent Labs Lab 10/20/15 1937 11/04/2015 1158 10/23/15 0543  CALCIUM 9.0 9.3 9.3    CBC  Recent Labs Lab 10/20/15 1937 10/28/2015 1158 10/23/15 0543  WBC 17.5* 15.8* 22.5*  HGB 10.1* 10.6* 10.5*  HCT 30.5* 31.9* 32.6*  PLT 389 433* 371    Coag's  Recent Labs Lab 10/23/15 0958  INR 2.51*    Sepsis Markers  Recent Labs Lab 10/11/2015 1836 11/06/2015 2250 10/23/15 0543  LATICACIDVEN 3.0* 3.8* 4.8*    ABG No results for input(s): PHART, PCO2ART, PO2ART in the last 168 hours.  Liver Enzymes  Recent Labs Lab 10/20/15 1937 10/21/2015 1158 10/23/15 0958  AST 22 51* 9095*  ALT 16 44 4161*  ALKPHOS 88 114 258*  BILITOT 1.2 1.5* 0.9  ALBUMIN 4.0  3.9 3.3*    Cardiac Enzymes No results for input(s): TROPONINI, PROBNP in the last 168 hours.  Glucose  Recent Labs Lab 10/20/15 1959 10/20/15 2220 11/08/2015 2133 10/23/15 0407 10/23/15 1015 10/23/15 1107  GLUCAP 314* 301* 254* 200* 144* 124*    Imaging Ct Angio Abd/pel W/ And/or W/o  10/23/2015  CLINICAL DATA:  65 year old with abdominal pain and known mesenteric atherosclerotic disease. Previous endovascular treatment to the celiac trunk and SMA with stent placements. Evaluate for mesenteric ischemia. EXAM: CT ANGIOGRAPHY ABDOMEN AND PELVIS TECHNIQUE: Multidetector CT imaging of the abdomen and pelvis was performed using the standard protocol during bolus administration of intravenous contrast. Multiplanar reconstructed images including MIPs were obtained and  reviewed to evaluate the vascular anatomy. CONTRAST:  100 mL Omnipaque 350 COMPARISON:  10/21/2015 and 10/20/2015 FINDINGS: ARTERIAL FINDINGS: Aorta: Diffuse atherosclerotic disease involving the infrarenal abdominal aorta without aneurysm or dissection. The supraceliac aorta measures up to 2.4 cm. Celiac axis: There is a stent in the proximal celiac trunk that is occluded and there is no significant flow in the common hepatic artery or splenic artery. This is new from the exam on 10/20/2015. There is appears to be a small amount of flow in the left gastric artery. Superior mesenteric: There are stents in the proximal SMA and at the origin. SMA is occluded at the origin and there is some reconstitution near the distal aspect of the most distal stent. Previously, the most distal stent from was widely patent. Left renal: At least moderate stenosis at the origin of the left renal artery related to calcified plaque. Left renal artery is patent. Right renal: At least moderate stenosis involving the proximal right renal artery due to calcified plaque. Right renal artery is patent. Inferior mesenteric: Proximal IMA is patent although there is probably aortic mural thrombus or plaque near the origin. Left iliac: Mixed plaque in left iliac arteries. Mild stenosis in the proximal left common iliac artery. Mild stenosis in the left internal iliac artery. Left external iliac artery is patent. Proximal femoral arteries are patent. Right iliac: Mixed plaque in the right common iliac artery with mild stenosis. Mild stenosis in the right external iliac artery due to plaque. Proximal right femoral arteries are patent. Poor opacification of proximal femoral arteries due to the phase of imaging. Venous findings: Portal veins are patent. No significant opacification of the splenic vein due to the compromised arterial flow. Mixing of contrast and blood in the SMV. Review of the MIP images confirms the above findings. NONVASCULAR  FINDINGS: Lung bases are clear. Small amount of atelectasis in the right middle lobe. No pleural effusions. There is a marked change in the appearance of the right hepatic lobe. There are now large areas of low-density involving the right hepatic lobe which extend to the capsule. Based on the arterial findings, these changes are concerning for ischemia in the right hepatic lobe. Normal appearance of the left hepatic lobe. There is no significant enhancement in the spleen even on the delayed images. No gross abnormality to the pancreas. Mild fullness and stranding involving the left adrenal gland. Normal appearance of the right adrenal gland. Normal appearance of both kidneys without evidence of stranding or infarct. Intravenous contrast in the urinary bladder. Uterus is surgically absent. Stable appearance of the left ovary. There is a small hiatal hernia. There is dilatation of the proximal jejunum without significant wall thickening. Previously, there was concern for wall thickening in this area. Fluid-filled small bowel loops throughout the abdomen. No  evidence for a bowel obstruction. No gross abnormality to the appendix. No acute abnormality to the colon. There is no evidence for abdominal ascites. No significant mesenteric edema. No suspicious lymphadenopathy in the abdomen or pelvis. No suspicious bone findings. IMPRESSION: New occlusion of the celiac trunk. No significant arterial flow in the common hepatic artery or splenic artery. Evidence for new edema in the right hepatic lobe which is suggestive for hepatic ischemia. In addition, there is no significant arterial flow or enhancement in the spleen, even on the delayed images. Occlusion of the SMA at the origin and involving the proximal SMA stents. There is distal reconstitution of the SMA near the distal stent. There appears to be increased thrombosis of the SMA stents compared to the recent comparison exam as described. Mild dilatation of the proximal  jejunum. Changes of bowel ischemia cannot be excluded. However, there is no significant mesenteric edema or ascites. No evidence for bowel obstruction. This report was discussed with Dr. Alphonsa Overall on 10/23/2015 at approximately 9 a.m. Electronically Signed   By: Markus Daft M.D.   On: 10/23/2015 11:15    STUDIES:  CT angio abdomen 4/13 > New occlusion of the celiac trunk. No significant arterial flow invthe common hepatic artery or splenic artery. Evidence for new edema in the right hepatic lobe which is suggestive for hepatic ischemia. In addition, there is no significant arterial flow or enhancement in the spleen, even on the delayed images. Occlusion of the SMA at the origin and involving the proximal SMA stents. There is distal reconstitution of the SMA near the distal stent. There appears to be increased thrombosis of the SMA stents compared to the recent comparison exam as described. Mild dilatation of the proximal jejunum. Changes of bowel ischemia cannot be excluded. However, there is no significant mesenteric edema or ascites. No evidence for bowel obstruction.  CULTURES: Urine 4/13 >   ANTIBIOTICS: Zosyn 4/13 >   SIGNIFICANT EVENTS: 4/12 admit for SMA stenosis 4/13 SMA now occluded, to OR for stent, angio  LINES/TUBES: R fem sheath 4/13 >  R IJ CVL 4/13>  DISCUSSION: 65 year old female admitted 4/12 with abdominal pain and SMA stenosis, worsened 4/13 with total occlusion. To IR for SMA stent and angioplasty. Post operatively to ICU on pressors. Also profound acidosis.   ASSESSMENT / PLAN:  PULMONARY A: Concern with airway protection, suspect secondary to sedating meds H/o Asthma without acute exacerbation  P:   Close monitoring ABG now & repeat at 2300 today PRN albuterol  CARDIOVASCULAR A:  Shock Chronic mesenteric ischemia with acute occlusion of the SMA and celiac trunk Profound acidosis. Suspect High AG from lactic but cannot confirm  P:  Telemetry MAP goal  > 65 mmHg Levophed for MAP goal, however, now tachy, transition to phenylephrine if able CVP monitoring, goal 8-12 If CVP less than 8 give fluids EKG Repeat lactic acid Troponin Anticoagulation as below  RENAL A:   No acute issues  P:   Repeat BMP and extended lytes Follow UOP  GASTROINTESTINAL A:   Transaminitis secondary to hepatic ischemia  P:   NPO Follow LFT PPI  HEMATOLOGIC A:   Coagulopathy secondary to hepatic failure Anemia  P:  Trend Coags Follow CBC Hgb/Hct at 2300 today Heparin gtt per pharmacy  INFECTIOUS A:   ? Intraabdominal infection  P:   Zosyn as above  ENDOCRINE A:   DM 2  P:   CBG monitoring and sliding scale insulin  NEUROLOGIC A:  Acute metabolic encephalopathy in setting severe acidosis  P:   RASS goal: 0 Close monitoring   FAMILY  - Updates:  Sister Marko Plume updated by Dr. Ashok Cordia on 4/13.  - Inter-disciplinary family meet or Palliative Care meeting due by:  10/30/2015   Georgann Housekeeper, AGACNP-BC [place] Pulmonology/Critical Care Pager [phone] or [phone]  10/23/2015 7:12 PM   PCCM Attending Note: Seen and examined with nurse practitioner. Please refer to his consultation note which I have reviewed in detail. 65 year old female with abdominal arterial occlusion status post stent placement. Currently with metabolic and respiratory acidosis. Also in a state of shock. Continuing vasopressor support & bicarbonate infusion. Continuing empiric Zosyn. Trending Procalcitonin. Trending hemoglobin/hematocrit as well as ABG. Suspect some element of respiratory acidosis due to conscious sedation from procedure. I updated the patient's sister regarding her critical status and multisystem organ failure. I also informed her of the patient's poor prognosis overall. We will continue to trend her acute liver failure.  I have spent a total of 33 minutes of critical care time today caring for the patient, discussing the  patient with her sister via phone, and reviewing the patient's electronic medical record.  Sonia Baller Ashok Cordia, M.D. [facility] Pulmonary & Critical Care Pager:  [phone] After 3pm or if no response, call [phone] 8:04 PM 10/23/2015

## 2015-10-23 NOTE — Sedation Documentation (Signed)
Fluid bolus in progress.

## 2015-10-23 NOTE — Progress Notes (Signed)
ANTICOAGULATION CONSULT NOTE - Initial Consult  Pharmacy Consult for Heparin Indication: SMA  No Known Allergies  Patient Measurements: Height: 5\' 3"  (160 cm) Weight: 165 lb 5.5 oz (75 kg) IBW/kg (Calculated) : 52.4 Heparin Dosing Weight: 68 kg  Vital Signs: Temp: 97 F (36.1 C) (04/13 1200) Temp Source: Oral (04/13 1200) BP: 91/62 mmHg (04/13 1801) Pulse Rate: 114 (04/13 1801)  Labs:  Recent Labs  10/20/15 1937 11/09/2015 1158 10/23/15 0543 10/23/15 0958  HGB 10.1* 10.6* 10.5*  --   HCT 30.5* 31.9* 32.6*  --   PLT 389 433* 371  --   LABPROT  --   --   --  26.8*  INR  --   --   --  2.51*  CREATININE 0.76 0.69 0.80  --     Estimated Creatinine Clearance: 68.9 mL/min (by C-G formula based on Cr of 0.8).   Medical History: Past Medical History  Diagnosis Date  . Staph infection   . Restless leg syndrome   . Chronic mesenteric ischemia (H[facility])     s/p SMA stent 06/22/13  . Coronary artery disease   . Hypertension   . Hyperlipidemia   . Heart murmur   . Asthma     "mild touch" (02/11/2015)  . Anemia   . GERD (gastroesophageal reflux disease)   . Chronic lower back pain   . Type II diabetes mellitus ([place])   . Diabetic peripheral neuropathy ([place])     "in my legs" (02/11/2015)  . Daily headache   . Arthritis     "hands" (02/25/2015)    Medications:  Prescriptions prior to admission  Medication Sig Dispense Refill Last Dose  . acetaminophen (TYLENOL) 650 MG CR tablet Take 1 tablet (650 mg total) by mouth every 8 (eight) hours as needed for pain. 60 tablet 3 11/05/2015 at Unknown time  . atorvastatin (LIPITOR) 20 MG tablet Take 1 tablet (20 mg total) by mouth daily. 30 tablet 5 10/26/2015 at Unknown time  . benazepril (LOTENSIN) 10 MG tablet Take 1 tablet (10 mg total) by mouth daily. 30 tablet 5 10/16/2015 at Unknown time  . cephALEXin (KEFLEX) 500 MG capsule Take 1 capsule (500 mg total) by mouth 2 (two) times daily. 20 capsule 0 11/04/2015 at Unknown time  .  clopidogrel (PLAVIX) 75 MG tablet TAKE 1 TABLET (75 MG TOTAL) BY MOUTH DAILY WITH BREAKFAST. 30 tablet 2 11/07/2015 at 0630  . cyclobenzaprine (FLEXERIL) 5 MG tablet TAKE 1 TABLET (5 MG TOTAL) BY MOUTH AT BEDTIME. 30 tablet 3 10/21/2015 at Unknown time  . DULoxetine (CYMBALTA) 30 MG capsule Take 1 capsule (30 mg total) by mouth daily. 30 capsule 5 11/07/2015 at Unknown time  . famotidine (PEPCID) 20 MG tablet TAKE 1 TABLET BY MOUTH 2 TIMES DAILY. 60 tablet 3 10/27/2015 at Unknown time  . ferrous sulfate 325 (65 FE) MG tablet Take 1 tablet (325 mg total) by mouth daily with breakfast. 30 tablet 3 11/08/2015 at Unknown time  . gabapentin (NEURONTIN) 300 MG capsule Take 1 capsule (300 mg total) by mouth 3 (three) times daily. 90 capsule 12 11/07/2015 at Unknown time  . glipiZIDE (GLUCOTROL) 10 MG tablet TAKE 1 TABLET BY MOUTH 2 TIMES DAILY BEFORE A MEAL. 60 tablet 2 11/09/2015 at Unknown time  . Insulin Glargine (LANTUS SOLOSTAR) 100 UNIT/ML Solostar Pen Inject 65 Units into the skin at bedtime. 15 mL 3 10/21/2015 at Unknown time  . loratadine (CLARITIN) 10 MG tablet Take 1 tablet (10 mg total) by  mouth daily as needed for allergies or rhinitis. 30 tablet 11 Past Month at Unknown time  . ondansetron (ZOFRAN ODT) 4 MG disintegrating tablet Take 1 tablet (4 mg total) by mouth every 8 (eight) hours as needed for nausea. 10 tablet 0 10/14/2015 at Unknown time  . oxymetazoline (AFRIN NASAL SPRAY) 0.05 % nasal spray Place 1 spray into both nostrils 2 (two) times daily as needed for congestion. 30 mL 0 unknown  . pantoprazole (PROTONIX) 40 MG tablet Take 1 tablet (40 mg total) by mouth daily. 30 tablet 11 10/17/2015 at Unknown time  . sitaGLIPtin-metformin (JANUMET) 50-1000 MG tablet Take 1 tablet by mouth daily. 30 tablet 2 11/08/2015 at Unknown time  . Vitamin D, Ergocalciferol, (DRISDOL) 50000 UNITS CAPS capsule Take 50,000 Units by mouth every 7 (seven) days. sunday   10/19/2015  . VOLTAREN 1 % GEL APPLY 4 GRAMS  TOPICALLY 4 TIMES DAILY. 100 g 0 Past Week at Unknown time  . ACCU-CHEK AVIVA PLUS test strip USE AS INSTRUCTED 100 each 12   . glucose blood (ACCU-CHEK AVIVA) test strip 1 each by Other route 3 (three) times daily. Use as instructed 100 each 12   . Lancets (ACCU-CHEK MULTICLIX) lancets Use as instructed 100 each 12    Scheduled:  . atorvastatin  20 mg Oral Daily  . DULoxetine  30 mg Oral Daily  . heparin lock flush      . heparin lock flush      . heparin lock flush      . HYDROmorphone   Intravenous 6 times per day  . insulin aspart  0-15 Units Subcutaneous 6 times per day  . lidocaine      . midazolam      . piperacillin-tazobactam  3.375 g Intravenous Once  . piperacillin-tazobactam (ZOSYN)  IV  3.375 g Intravenous Q8H   Infusions:  . sodium chloride 20 mL/hr at 10/23/15 1200  . norepinephrine (LEVOPHED) Adult infusion 20 mcg/min (10/23/15 1751)    Assessment: 65yo female with arterial occlusion. Pharmacy is consulted to dose heparin for SMA following IR today. Pt has received heparin 4000 unit bolus in IR.   Goal of Therapy:  Heparin level 0.3-0.7 units/ml Monitor platelets by anticoagulation protocol: Yes   Plan:  Give 4000 units bolus x 1 Start heparin infusion at 1150 units/hr Check anti-Xa level in 6 hours and daily while on heparin Continue to monitor H&H and platelets  Andrey Cota. Diona Foley, PharmD, BCPS Clinical Pharmacist Pager [phone] 10/23/2015,6:09 PM

## 2015-10-23 NOTE — Progress Notes (Signed)
Patient ID: Sarah Alvarez, female   DOB: May 31, 1951, 65 y.o.   MRN: [id number] Sedated. Abdomen is soft without appreciable tenderness or guarding. She is acidotic and is being treated by CCM. SMA was re-stented. There are no good surgical options - I D/W Dr. Doren Custard from [facility]. We will follow. Georganna Skeans, MD, MPH, FACS Trauma: [phone] General Surgery: [phone]

## 2015-10-23 NOTE — Progress Notes (Signed)
[place] Progress Note Patient Name: Sarah Alvarez DOB: 01-03-1951 MRN: [id number]   Date of Service  10/23/2015  HPI/Events of Note  Pt with  Somnolence, not responding to sternal rub.   eICU Interventions  Gave 1 mg narcan > starting to groan.  118/40, 110, 20, 100% on BiPaP15/5 R20, 100% Slowly getting more awake. Initially given 2 amps 2amps NaHCO3 ABG 6.9/60/90 on 2L  Plan to observe the next 30 mins. May end up being intubated. Keep on Bipap. Nurse reports dirty urine. Will culture and order abx.      Intervention Category Major Interventions: Airway management  Rush Landmark 10/23/2015, 10:20 PM

## 2015-10-23 NOTE — Sedation Documentation (Signed)
A separate Time Out completed due to separate procedure, change of RN and Radiologist

## 2015-10-23 NOTE — Progress Notes (Signed)
BRIEF NUTRITION NOTE  Attempted to see pt for MST. Pt in radiology for exploratory procedures. Pt admitted from [facility] 30 minutes prior to radiology. Spoke to RN who said to wait and see pt tomorrow. Will perform full assessment tomorrow.  Geoffery Lyons, [place] [facility] Dietetic Intern Pager [phone]

## 2015-10-23 NOTE — Progress Notes (Signed)
CRITICAL VALUE ALERT  Critical value received:  Lactic acid 15.4   Date of notification:  10/23/15  Time of notification:  0034  Critical value read back: yes  Nurse who received alert:  A. Darcel Smalling RN   MD notified (1st page):  Suezanne Jacquet RN at [facility] made aware

## 2015-10-23 NOTE — Consult Note (Signed)
Chief Complaint: Patient was seen in consultation today for mesenteric arteriogram with possible intervention Chief Complaint  Patient presents with  . Abdominal Pain   at the request of Dr Alphonsa Overall  Referring Physician(s): Dr Dia Crawford Dr Alphonsa Overall  Supervising Physician: Aletta Edouard  History of Present Illness: Sarah Alvarez is a 65 y.o. 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 No significant arterial flow in the common hepatic artery or splenic artery. Evidence for new edema in the right hepatic lobe which is suggestive for hepatic ischemia. In addition, there is no significant arterial flow or enhancement in the spleen, even on the delayed images.  Occlusion of the SMA at the origin and involving the proximal SMA stents. There is distal reconstitution of the SMA near the distal stent. There appears to be increased thrombosis of the SMA stents compared to the recent comparison exam as described.  Mild dilatation of the proximal jejunum. Changes of bowel ischemia cannot be excluded. However, there is no significant mesenteric edema or ascites. No evidence for bowel obstruction.  Request for mesenteric arteriogram with possible arterial intervention per r D Lucia Gaskins and Dr Philis Pique Dr Kathlene Cote has seen imaging and approves procedure  INR 2.5 Dr Kathlene Cote aware  Past Medical History  Diagnosis Date  . Staph infection   . Restless leg syndrome   . Chronic mesenteric ischemia (HCC)     s/p SMA stent 06/22/13  . Coronary artery disease   . Hypertension   . Hyperlipidemia   . Heart murmur   . Asthma     "mild touch" (02/11/2015)  . Anemia   . GERD (gastroesophageal reflux disease)   . Chronic lower back pain   . Type II  diabetes mellitus ([place])   . Diabetic peripheral neuropathy ([place])     "in my legs" (02/11/2015)  . Daily headache   . Arthritis     "hands" (02/25/2015)    Past Surgical History  Procedure Laterality Date  . Endarterectomy Right 07/19/2013    Procedure: ENDARTERECTOMY CAROTID-RIGHT;  Surgeon: Angelia Mould, MD;  Location: [place];  Service: Vascular;  Laterality: Right;  . Insertion of retrograde carotid stent Left 03/05/2014    Procedure: 1)Cutdown of open exposure left common carotid; 2) Retrograde left common carotid stent; 3) Repair of left common carotid artery.;  Surgeon: Elam Dutch, MD;  Location: [place];  Service: Vascular;  Laterality: Left;  . Arch aortogram N/A 02/11/2014    Procedure: ARCH AORTOGRAM;  Surgeon: Angelia Mould, MD;  Location: [facility] CATH LAB;  Service: Cardiovascular;  Laterality: N/A;  . Left heart catheterization with coronary angiogram N/A 10/03/2014    Procedure: LEFT HEART CATHETERIZATION WITH CORONARY ANGIOGRAM;  Surgeon: Burnell Blanks, MD;  Location: [facility] CATH LAB;  Service: Cardiovascular;  Laterality: N/A;  . Cataract extraction w/ intraocular lens implant Left 02/03/2015  . Tonsillectomy  1950's  . Finger fracture surgery Left 2015    "they had to rebreak it and put pins in it"  . Fracture surgery    . Abdominal hysterectomy    . Coronary angioplasty      ? LCX stent ~ 2000 in [place], [place] with LCX stent [facility] 09/1999 for reported re-instent stenosis  . Esophagogastroduodenoscopy (egd) with  propofol Left 02/13/2015    Procedure: ESOPHAGOGASTRODUODENOSCOPY (EGD) WITH PROPOFOL;  Surgeon: Ronald Lobo, MD;  Location: [place];  Service: Endoscopy;  Laterality: Left;  . Colonoscopy with propofol N/A 02/14/2015    Procedure: COLONOSCOPY WITH PROPOFOL;  Surgeon: Ronald Lobo, MD;  Location: [facility] ENDOSCOPY;  Service: Endoscopy;  Laterality: N/A;  . Laparoscopic cholecystectomy    . Givens capsule study N/A 02/27/2015    Procedure: GIVENS CAPSULE  STUDY;  Surgeon: Wilford Corner, MD;  Location: [facility] ENDOSCOPY;  Service: Endoscopy;  Laterality: N/A;    Allergies: Review of patient's allergies indicates no known allergies.  Medications: Prior to Admission medications   Medication Sig Start Date End Date Taking? Authorizing Provider  acetaminophen (TYLENOL) 650 MG CR tablet Take 1 tablet (650 mg total) by mouth every 8 (eight) hours as needed for pain. 03/27/15  Yes Josalyn Funches, MD  atorvastatin (LIPITOR) 20 MG tablet Take 1 tablet (20 mg total) by mouth daily. 03/27/15  Yes Josalyn Funches, MD  benazepril (LOTENSIN) 10 MG tablet Take 1 tablet (10 mg total) by mouth daily. 03/27/15  Yes Josalyn Funches, MD  cephALEXin (KEFLEX) 500 MG capsule Take 1 capsule (500 mg total) by mouth 2 (two) times daily. 10/20/15  Yes Marella Chimes, PA-C  clopidogrel (PLAVIX) 75 MG tablet TAKE 1 TABLET (75 MG TOTAL) BY MOUTH DAILY WITH BREAKFAST. 08/07/15  Yes Josalyn Funches, MD  cyclobenzaprine (FLEXERIL) 5 MG tablet TAKE 1 TABLET (5 MG TOTAL) BY MOUTH AT BEDTIME. 08/07/15  Yes Josalyn Funches, MD  DULoxetine (CYMBALTA) 30 MG capsule Take 1 capsule (30 mg total) by mouth daily. 05/27/15  Yes Josalyn Funches, MD  famotidine (PEPCID) 20 MG tablet TAKE 1 TABLET BY MOUTH 2 TIMES DAILY. 06/17/15  Yes Tresa Garter, MD  ferrous sulfate 325 (65 FE) MG tablet Take 1 tablet (325 mg total) by mouth daily with breakfast. 03/27/15  Yes Josalyn Funches, MD  gabapentin (NEURONTIN) 300 MG capsule Take 1 capsule (300 mg total) by mouth 3 (three) times daily. 03/27/15  Yes Josalyn Funches, MD  glipiZIDE (GLUCOTROL) 10 MG tablet TAKE 1 TABLET BY MOUTH 2 TIMES DAILY BEFORE A MEAL. 08/07/15  Yes Josalyn Funches, MD  Insulin Glargine (LANTUS SOLOSTAR) 100 UNIT/ML Solostar Pen Inject 65 Units into the skin at bedtime. 05/27/15  Yes Josalyn Funches, MD  loratadine (CLARITIN) 10 MG tablet Take 1 tablet (10 mg total) by mouth daily as needed for allergies or rhinitis. 03/27/15   Yes Josalyn Funches, MD  ondansetron (ZOFRAN ODT) 4 MG disintegrating tablet Take 1 tablet (4 mg total) by mouth every 8 (eight) hours as needed for nausea. 10/20/15  Yes Marella Chimes, PA-C  oxymetazoline (AFRIN NASAL SPRAY) 0.05 % nasal spray Place 1 spray into both nostrils 2 (two) times daily as needed for congestion. 01/22/14  Yes Deepak Advani, MD  pantoprazole (PROTONIX) 40 MG tablet Take 1 tablet (40 mg total) by mouth daily. 03/27/15  Yes Josalyn Funches, MD  sitaGLIPtin-metformin (JANUMET) 50-1000 MG tablet Take 1 tablet by mouth daily. 05/27/15  Yes Josalyn Funches, MD  Vitamin D, Ergocalciferol, (DRISDOL) 50000 UNITS CAPS capsule Take 50,000 Units by mouth every 7 (seven) days. sunday   Yes Historical Provider, MD  VOLTAREN 1 % GEL APPLY 4 GRAMS TOPICALLY 4 TIMES DAILY. 08/07/15  Yes Josalyn Funches, MD  ACCU-CHEK AVIVA PLUS test strip USE AS INSTRUCTED 09/30/15   Tresa Garter, MD  glucose blood (ACCU-CHEK AVIVA) test strip 1 each by Other route 3 (three) times daily.  Use as instructed 03/03/15   Boykin Nearing, MD  Lancets (ACCU-CHEK MULTICLIX) lancets Use as instructed 06/19/14   Lorayne Marek, MD     Family History  Problem Relation Age of Onset  . Diabetes Mother   . Stroke Mother   . Hyperlipidemia Mother   . Heart disease Mother   . Hypertension Mother   . Heart attack Mother   . Cancer Father   . Heart disease Father     PVD and  CAROTID  . Heart attack Father   . Deep vein thrombosis Father   . Hyperlipidemia Father   . Hypertension Father   . Diabetes Sister   . Hypertension Sister   . Heart disease Sister     Before age 52  . Hyperlipidemia Sister   . Varicose Veins Sister   . Cancer Brother   . Cancer Maternal Aunt     breast cancer   . Diabetes Maternal Grandmother   . Diabetes Paternal Grandmother     Social History   Social History  . Marital Status: Divorced    Spouse Name: N/A  . Number of Children: N/A  . Years of Education: N/A    Social History Main Topics  . Smoking status: Former Smoker -- 1.00 packs/day for 20 years    Types: Cigarettes    Quit date: 06/21/2013  . Smokeless tobacco: Never Used  . Alcohol Use: No  . Drug Use: No  . Sexual Activity: No   Other Topics Concern  . None   Social History Narrative    Review of Systems: A 12 point ROS discussed and pertinent positives are indicated in the HPI above.  All other systems are negative.  Review of Systems  Constitutional: Positive for activity change and appetite change. Negative for fever.  Respiratory: Negative for cough and shortness of breath.   Gastrointestinal: Positive for nausea, vomiting, abdominal pain and abdominal distention. Negative for diarrhea.  Neurological: Positive for weakness.  Psychiatric/Behavioral: Negative for behavioral problems and confusion.    Vital Signs: BP 97/64 mmHg  Pulse 115  Temp(Src) 97.3 F (36.3 C) (Oral)  Resp 16  Ht 5\' 3"  (1.6 m)  Wt 165 lb 5.5 oz (75 kg)  BMI 29.30 kg/m2  SpO2 100%  Physical Exam  Constitutional: She is oriented to person, place, and time.  Cardiovascular: Normal rate and regular rhythm.   Pulmonary/Chest: Effort normal and breath sounds normal. She has no wheezes.  Abdominal: Bowel sounds are normal. She exhibits distension. There is tenderness.  Musculoskeletal: Normal range of motion.  Neurological: She is alert and oriented to person, place, and time.  Skin: Skin is warm and dry.  Psychiatric: She has a normal mood and affect. Her behavior is normal. Judgment and thought content normal.  Nursing note and vitals reviewed.   Mallampati Score:  MD Evaluation Airway: WNL Heart: WNL Abdomen: WNL Chest/ Lungs: WNL ASA  Classification: 3 Mallampati/Airway Score: Two  Imaging: Ct Abdomen Pelvis W Contrast  10/15/2015  CLINICAL DATA:  Severe right lower quadrant pain with vomiting for 4 days. Chronic mesenteric ischemia EXAM: CT ABDOMEN AND PELVIS WITH CONTRAST  TECHNIQUE: Multidetector CT imaging of the abdomen and pelvis was performed using the standard protocol following bolus administration of intravenous contrast. CONTRAST:  135mL ISOVUE-300 IOPAMIDOL (ISOVUE-300) INJECTION 61% COMPARISON:  10/20/2015 FINDINGS: Lower chest and abdominal wall:  Extensive coronary atherosclerosis. Hepatobiliary: No focal liver abnormality.Cholecystectomy with normal common bile duct diameter. Pancreas: Generalized atrophy.  No inflammation or duct enlargement  Spleen: Diffusely poor enhancement when compared to prior Adrenals/Urinary Tract: Negative adrenals. No hydronephrosis or stone. Unremarkable bladder. Reproductive:Hysterectomy. Stomach/Bowel: Fold thickening involving the proximal small bowel which is nonspecific. There is no signs of bowel necrosis. No appendicitis. Vascular/Lymphatic: Extensive atherosclerosis status post stenting of the celiac and SMA. Based on recent CTA, there is in stent stenosis or occlusion of both the celiac and proximal SMA stents. Today, there is poor opacification of the celiac branches especially when compared to the distal SMA and SMA branches, despite being in the nephrographic/portal venous phase. These vessels do opacify on the excretory phase. There is correlate poor enhancement of the spleen, especially when compared to the kidneys and prior study. Peritoneal: No ascites or pneumoperitoneum. Musculoskeletal: No acute abnormalities. These results were called by telephone at the time of interpretation on 10/21/2015 at 4:02 pm to Dr. Jola Schmidt , who verbally acknowledged these results. IMPRESSION: 1. 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 This proximal SMA stent occlusion has occurred since January 2017 CTA. Celiac branch opacification and splenic enhancement is poor/delayed, even when compared to CTA 2 days ago. 2. Proximal small bowel fold thickening could ischemic given #1, or from infectious enteritis in this  patient with vomiting. 3. History of bacturia.  No signs of pyelonephritis. Electronically Signed   By: Monte Fantasia M.D.   On: 10/28/2015 16:05   Dg Abd 2 Views  10/21/2015  CLINICAL DATA:  Nausea, vomiting and diarrhea since yesterday. Generalized abdominal pain. History of diabetes. EXAM: ABDOMEN - 2 VIEW COMPARISON:  CT 10/20/2015.  Portable chest 03/03/2015. FINDINGS: The bowel gas pattern is normal. There is no free intraperitoneal air. Cholecystectomy clips and vascular stents are noted. There are no suspicious abdominal calcifications. Mild atelectasis is present at both lung bases. IMPRESSION: No active abdominal findings. Electronically Signed   By: Richardean Sale M.D.   On: 10/21/2015 13:38   Mm Digital Screening Bilateral  10/02/2015  CLINICAL DATA:  Screening. EXAM: DIGITAL SCREENING BILATERAL MAMMOGRAM WITH CAD COMPARISON:  Previous exam(s). ACR Breast Density Category b: There are scattered areas of fibroglandular density. FINDINGS: There are no findings suspicious for malignancy. Images were processed with CAD. IMPRESSION: No mammographic evidence of malignancy. A result letter of this screening mammogram will be mailed directly to the patient. RECOMMENDATION: Screening mammogram in one year. (Code:SM-B-01Y) BI-RADS CATEGORY  1: Negative. Electronically Signed   By: Evangeline Dakin M.D.   On: 10/02/2015 17:15   Ct Angio Abd/pel W/ And/or W/o  10/23/2015  CLINICAL DATA:  65 year old with abdominal pain and known mesenteric atherosclerotic disease. Previous endovascular treatment to the celiac trunk and SMA with stent placements. Evaluate for mesenteric ischemia. EXAM: CT ANGIOGRAPHY ABDOMEN AND PELVIS TECHNIQUE: Multidetector CT imaging of the abdomen and pelvis was performed using the standard protocol during bolus administration of intravenous contrast. Multiplanar reconstructed images including MIPs were obtained and reviewed to evaluate the vascular anatomy. CONTRAST:  100 mL  Omnipaque 350 COMPARISON:  10/13/2015 and 10/20/2015 FINDINGS: ARTERIAL FINDINGS: Aorta: Diffuse atherosclerotic disease involving the infrarenal abdominal aorta without aneurysm or dissection. The supraceliac aorta measures up to 2.4 cm. Celiac axis: There is a stent in the proximal celiac trunk that is occluded and there is no significant flow in the common hepatic artery or splenic artery. This is new from the exam on 10/20/2015. There is appears to be a small amount of flow in the left gastric artery. Superior mesenteric: There are stents in the proximal SMA and  at the origin. SMA is occluded at the origin and there is some reconstitution near the distal aspect of the most distal stent. Previously, the most distal stent from was widely patent. Left renal: At least moderate stenosis at the origin of the left renal artery related to calcified plaque. Left renal artery is patent. Right renal: At least moderate stenosis involving the proximal right renal artery due to calcified plaque. Right renal artery is patent. Inferior mesenteric: Proximal IMA is patent although there is probably aortic mural thrombus or plaque near the origin. Left iliac: Mixed plaque in left iliac arteries. Mild stenosis in the proximal left common iliac artery. Mild stenosis in the left internal iliac artery. Left external iliac artery is patent. Proximal femoral arteries are patent. Right iliac: Mixed plaque in the right common iliac artery with mild stenosis. Mild stenosis in the right external iliac artery due to plaque. Proximal right femoral arteries are patent. Poor opacification of proximal femoral arteries due to the phase of imaging. Venous findings: Portal veins are patent. No significant opacification of the splenic vein due to the compromised arterial flow. Mixing of contrast and blood in the SMV. Review of the MIP images confirms the above findings. NONVASCULAR FINDINGS: Lung bases are clear. Small amount of atelectasis in the  right middle lobe. No pleural effusions. There is a marked change in the appearance of the right hepatic lobe. There are now large areas of low-density involving the right hepatic lobe which extend to the capsule. Based on the arterial findings, these changes are concerning for ischemia in the right hepatic lobe. Normal appearance of the left hepatic lobe. There is no significant enhancement in the spleen even on the delayed images. No gross abnormality to the pancreas. Mild fullness and stranding involving the left adrenal gland. Normal appearance of the right adrenal gland. Normal appearance of both kidneys without evidence of stranding or infarct. Intravenous contrast in the urinary bladder. Uterus is surgically absent. Stable appearance of the left ovary. There is a small hiatal hernia. There is dilatation of the proximal jejunum without significant wall thickening. Previously, there was concern for wall thickening in this area. Fluid-filled small bowel loops throughout the abdomen. No evidence for a bowel obstruction. No gross abnormality to the appendix. No acute abnormality to the colon. There is no evidence for abdominal ascites. No significant mesenteric edema. No suspicious lymphadenopathy in the abdomen or pelvis. No suspicious bone findings. IMPRESSION: New occlusion of the celiac trunk. No significant arterial flow in the common hepatic artery or splenic artery. Evidence for new edema in the right hepatic lobe which is suggestive for hepatic ischemia. In addition, there is no significant arterial flow or enhancement in the spleen, even on the delayed images. Occlusion of the SMA at the origin and involving the proximal SMA stents. There is distal reconstitution of the SMA near the distal stent. There appears to be increased thrombosis of the SMA stents compared to the recent comparison exam as described. Mild dilatation of the proximal jejunum. Changes of bowel ischemia cannot be excluded. However,  there is no significant mesenteric edema or ascites. No evidence for bowel obstruction. This report was discussed with Dr. Alphonsa Overall on 10/23/2015 at approximately 9 a.m. Electronically Signed   By: Markus Daft M.D.   On: 10/23/2015 11:15   Ct Angio Abd/pel W/ And/or W/o  10/20/2015  CLINICAL DATA:  Acute onset of nausea vomiting and diarrhea. Upper abdominal pain. Hyperglycemia. Initial encounter. EXAM: CTA ABDOMEN AND PELVIS wITHOUT  AND WITH CONTRAST TECHNIQUE: Multidetector CT imaging of the abdomen and pelvis was performed using the standard protocol during bolus administration of intravenous contrast. Multiplanar reconstructed images and MIPs were obtained and reviewed to evaluate the vascular anatomy. CONTRAST:  100 mL of Isovue 370 IV contrast COMPARISON:  CT of the abdomen and pelvis from 08/06/2015 FINDINGS: The visualized lung bases are clear. The liver and spleen are unremarkable in appearance. The patient is status post cholecystectomy, with clips noted at the gallbladder fossa. The pancreas and adrenal glands are unremarkable. The kidneys are unremarkable in appearance. There is no evidence of hydronephrosis. No renal or ureteral stones are seen. No perinephric stranding is appreciated. No free fluid is identified. The small bowel is unremarkable in appearance. The stomach is within normal limits. No acute vascular abnormalities are seen. Scattered calcification is noted along the abdominal aorta and its branches. Vascular stents are noted at the celiac trunk and superior mesenteric artery. The appendix is normal in caliber, without evidence of appendicitis. The bladder is moderately distended and grossly unremarkable. The left ovary is grossly unremarkable. No suspicious adnexal masses are seen. No inguinal lymphadenopathy is seen. No acute osseous abnormalities are identified. Review of the MIP images confirms the above findings. IMPRESSION: 1. No acute abnormality seen within the abdomen or  pelvis. 2. Scattered calcification along the abdominal aorta and its branches. Electronically Signed   By: Garald Balding M.D.   On: 10/20/2015 21:53    Labs:  CBC:  Recent Labs  05/29/15 0938 10/20/15 1937 10/21/2015 1158 10/23/15 0543  WBC 5.9 17.5* 15.8* 22.5*  HGB 12.9 10.1* 10.6* 10.5*  HCT 38.3 30.5* 31.9* 32.6*  PLT 229 389 433* 371    COAGS:  Recent Labs  02/11/15 1857 10/23/15 0958  INR 1.07 2.51*    BMP:  Recent Labs  05/29/15 0938 07/31/15 1409 10/20/15 1937 10/25/2015 1158 10/23/15 0543  NA 139  --  132* 137 142  K 4.3  --  4.3 4.3 4.1  CL 104  --  98* 100* 108  CO2 28  --  20* 18* 19*  GLUCOSE 267*  --  323* 276* 208*  BUN 14 9 16 15  25*  CALCIUM 9.2  --  9.0 9.3 9.3  CREATININE 0.61 0.69 0.76 0.69 0.80  GFRNONAA  --  >89 >60 >60 >60  GFRAA  --  >89 >60 >60 >60    LIVER FUNCTION TESTS:  Recent Labs  03/03/15 1451 10/20/15 1937 10/16/2015 1158 10/23/15 0958  BILITOT 0.3 1.2 1.5* 0.9  AST 25 22 51* PENDING  ALT 13* 16 44 PENDING  ALKPHOS 74 88 114 258*  PROT 6.1* 6.8 7.0 5.9*  ALBUMIN 3.8 4.0 3.9 3.3*    TUMOR MARKERS: No results for input(s): AFPTM, CEA, CA199, CHROMGRNA in the last 8760 hours.  Assessment and Plan:  Intractable abd pain N/V Hx SMA stent 2014; then angioplasty 2015 New onset  abd pain few days now---worsening CT shows probable celiac trunk occlusion; SMA occlusion Now scheduled for mesenteric arteriogram with possible intervention Risks and Benefits discussed with the patient including, but not limited to bleeding, infection, vascular injury or contrast induced renal failure. All of the patient's questions were answered, patient is agreeable to proceed. Consent signed and in chart.   Thank you for this interesting consult.  I greatly enjoyed meeting GHADA BLANCHE and look forward to participating in their care.  A copy of this report was sent to the requesting provider on this date.  Electronically  Signed: Monia Sabal A 10/23/2015, 12:08 PM   I spent a total of 40 Minutes    in face to face in clinical consultation, greater than 50% of which was counseling/coordinating care for mesenteric arteriogram with poss intervention

## 2015-10-23 NOTE — Consult Note (Signed)
[facility]  Patient name: Sarah Alvarez MRN: [id number] DOB: [birth year] Sex: female  REASON FOR CONSULT: Mesenteric ischemia. Consult is from Dr. Alphonsa Overall.  HPI Sarah Alvarez is a 65 y.o. female, who I have been following with carotid disease.I last saw her in March 2016 at which time she had a 60-79% left carotid stenosis and was to come back in 6 months for a follow up visit.  She was admitted at [facility] on 10/28/2015 with abdominal pain. Of note she has a history of chronic mesenteric ischemia and has undergone previous stenting of the celiac axis and also stenting of the superior mesenteric artery. CT scan of the abdomen on 10/23/2015 showed a high-grade stenosis or possible occlusion of the celiac and SMA stents. There was also some thickening of the proximal small bowel, possibly consistent with mesenteric ischemia.  She was transferred to [facility] for mesenteric arteriography. 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 Dr. Kathlene Cote was able to recannulate the superior mesenteric artery and this was restented and significant clot was retrieved in order to recanalize the superior mesenteric artery.  She had a urine culture from 10/20/2015 which grew greater than 100,000 colonies of gram-negative rods.  Currently, she is heavily sedated. Unable to obtain history.   Past Medical History  Diagnosis Date  . Staph infection   . Restless leg syndrome   . Chronic mesenteric ischemia (HCC)     s/p SMA stent 06/22/13  . Coronary artery disease   . Hypertension   . Hyperlipidemia   . Heart murmur   . Asthma     "mild touch" (02/11/2015)  . Anemia   . GERD (gastroesophageal reflux disease)   . Chronic lower back pain   . Type II diabetes mellitus ([place])   . Diabetic peripheral neuropathy ([place])     "in my legs" (02/11/2015)  . Daily headache   . Arthritis     "hands"  (02/25/2015)    Family History  Problem Relation Age of Onset  . Diabetes Mother   . Stroke Mother   . Hyperlipidemia Mother   . Heart disease Mother   . Hypertension Mother   . Heart attack Mother   . Cancer Father   . Heart disease Father     PVD and  CAROTID  . Heart attack Father   . Deep vein thrombosis Father   . Hyperlipidemia Father   . Hypertension Father   . Diabetes Sister   . Hypertension Sister   . Heart disease Sister     Before age 50  . Hyperlipidemia Sister   . Varicose Veins Sister   . Cancer Brother   . Cancer Maternal Aunt     breast cancer   . Diabetes Maternal Grandmother   . Diabetes Paternal Grandmother     SOCIAL HISTORY: Social History   Social History  . Marital Status: Divorced    Spouse Name: N/A  . Number of Children: N/A  . Years of Education: N/A   Occupational History  . Not on file.   Social History Main Topics  . Smoking status: Former Smoker -- 1.00 packs/day for 20 years    Types: Cigarettes    Quit date: 06/21/2013  . Smokeless tobacco: Never Used  . Alcohol Use: No  . Drug Use: No  . Sexual Activity: No   Other Topics Concern  . Not on file  Social History Narrative    No Known Allergies  Current Facility-Administered Medications  Medication Dose Route Frequency Provider Last Rate Last Dose  . 0.9 %  sodium chloride infusion   Intravenous Continuous Kelvin Cellar, MD 20 mL/hr at 10/23/15 1200    . atorvastatin (LIPITOR) tablet 20 mg  20 mg Oral Daily Charlynne Cousins, MD   20 mg at 10/23/15 1000  . diphenhydrAMINE (BENADRYL) injection 12.5 mg  12.5 mg Intravenous Q6H PRN Charlynne Cousins, MD       Or  . diphenhydrAMINE (BENADRYL) 12.5 MG/5ML elixir 12.5 mg  12.5 mg Oral Q6H PRN Charlynne Cousins, MD      . DULoxetine (CYMBALTA) DR capsule 30 mg  30 mg Oral Daily Charlynne Cousins, MD   30 mg at 10/23/15 1000  . heparin injection 5,000 Units  5,000 Units Subcutaneous 3 times per day Monia Sabal,  PA-C   5,000 Units at 10/23/15 1400  . heparin injection   Intravenous PRN Aletta Edouard, MD   2,000 Units at 10/23/15 1609  . heparin lock flush 100 UNIT/ML injection           . heparin lock flush 100 UNIT/ML injection           . heparin lock flush 100 UNIT/ML injection           . HYDROmorphone (DILAUDID) 1 mg/mL PCA injection   Intravenous 6 times per day Charlynne Cousins, MD   Stopped at 10/23/15 1300  . HYDROmorphone (DILAUDID) injection 1 mg  1 mg Intravenous Q4H PRN Charlynne Cousins, MD   1 mg at 10/23/15 0934  . insulin aspart (novoLOG) injection 0-15 Units  0-15 Units Subcutaneous 6 times per day Kelvin Cellar, MD   0 Units at 10/23/15 1200  . lidocaine (XYLOCAINE) 1 % (with pres) injection           . midazolam (VERSED) 2 MG/2ML injection           . midazolam (VERSED) injection   Intravenous PRN Aletta Edouard, MD   1 mg at 10/23/15 1542  . morphine 4 MG/ML injection 4 mg  4 mg Intravenous Q4H PRN Charlynne Cousins, MD      . naloxone [facility]) injection 0.4 mg  0.4 mg Intravenous PRN Charlynne Cousins, MD       And  . sodium chloride flush (NS) 0.9 % injection 9 mL  9 mL Intravenous PRN Charlynne Cousins, MD      . norepinephrine (LEVOPHED) 4 mg in dextrose 5 % 250 mL (0.016 mg/mL) infusion  0-40 mcg/min Intravenous Titrated Aletta Edouard, MD 56.3 mL/hr at 10/23/15 1727 15 mcg/min at 10/23/15 1727  . ondansetron (ZOFRAN) 8 mg in sodium chloride 0.9 % 50 mL IVPB  8 mg Intravenous Q6H PRN Nat Christen, PA-C       Or  . ondansetron (ZOFRAN) tablet 4 mg  4 mg Oral Q6H PRN Nat Christen, PA-C      . piperacillin-tazobactam (ZOSYN) IVPB 3.375 g  3.375 g Intravenous Once Kelvin Cellar, MD   3.375 g at 10/23/15 1000  . piperacillin-tazobactam (ZOSYN) IVPB 3.375 g  3.375 g Intravenous Q8H Kelvin Cellar, MD        REVIEW OF SYSTEMS:  Unable to assess. She is sedated.  PHYSICAL EXAM: Filed Vitals:   10/23/15 1720 10/23/15 1725 10/23/15 1730 10/23/15 1735  BP: 82/52  82/58 84/61   Pulse: 114 113 114 114  Temp:  TempSrc:      Resp: 18 17 16 17   Height:      Weight:      SpO2: 91% 93% 92% 94%    GENERAL: The patient is sedated on the ventilator. The vital signs are documented above. CARDIAC: There is a regular rate and rhythm.  VASCULAR: Right IJ catheter in place.  Right femoral sheath in place Unable to palpate pedal pulses.  PULMONARY: There is good air exchange bilaterally without wheezing or rales. ABDOMEN: Difficult to assess because she is sedated. Abdomen is soft. No guarding.  MUSCULOSKELETAL: There are no major deformities or cyanosis. NEUROLOGIC: Unable to assess. SKIN: There are no ulcers or rashes noted. PSYCHIATRIC: unable to assess.  DATA:   INR this AM was 2.5  CT ANGIOGRAM OF ABDOMEN/PELVIS: CT angiogram of the abdomen and pelvis today showed occlusion of the celiac trunk with no flow in the common hepatic or splenic arteries. There was also possible hepatic ischemia. It was also no significant arterial flow into the spleen. Superior mesenteric artery was occluded at the origin. 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   MESENTERIC ARTERIOGRAM: As above, Dr. Kathlene Cote  was able to recanalize the superior mesenteric artery and this was restented.    MEDICAL ISSUES:  MESENTERIC ISCHEMIA: The patient has undergone previous angioplasty and stenting of both the celiac trunk and the superior mesenteric artery in the past. She presented with abdominal pain, and CT scan today showed that both stents were occluded.   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 There are no real endovascular options to address this. Likewise, I do not think there are any surgical options to address this.   Dr. Kathlene Cote was able to recanalize the superior mesenteric artery and this was restented. Fortunately, recanalization of the SMA was successful  as there are really not any good surgical options. She has diffuse calcific disease and it would be difficult to find inflow for a graft although potentially I could originate a graft off of the right common iliac artery. In addition, she would require placement of a prosthetic graft and she did have a urinary tract infection documented on admission yesterday. If she does undergo exploratory laparotomy, I would be happy to assist to assess the superior mesenteric artery. Would continue aggressive anticoagulation to help maintain patency of her superior mesenteric artery.   Deitra Mayo [facility] [place]: [phone]

## 2015-10-23 NOTE — Consult Note (Signed)
[facility] Surgery Consult Note  Sarah Alvarez August 09, 1950  [id number]  Requesting MD: Dr. 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  The pain has been going on since midnight on Saturday morning (10/18/15).  Her pain has been centrally severe but diffuse.  She feels a stabbing/throbbing pain.  Admits to continual nausea, vomiting, and dry heaving.  She hasn't been able to keep any food/liquids down.  She even thinks she threw up all her homes meds since Saturday.  She's having diarrhea at night.  No CP/SOB, no fever/chills.  No hematochezia, no hematemesis.  She said she hasn't had anything like this since the original SMA original stent placement 06/15/2013. She had recurrent intestinal angina symptoms requiring restenting of the SMA origin as well as proximal celiac origin stent insertions done in June 2015 by Dr. Kathlene Cote.   She had a CT Angio done on 10/20/15 which showed no acute vascular abnormalities, so they sent her home from the ER.  Pain worsened yesterday and she came in by ambulance to [facility].  CT scan done yesterday which showed high grade stent stenosis or occlusion of celiac and SMA stents.  This proximal SMA stent occlusion has occurred since January 2017's CTA with proximal small bowel thickening vs enteritis.  Lactic acid is 4.8 going up, and WBC is 22.5. She tells me that she quit smoking a couple of years ago.  H/o laparoscopic cholecystectomy and abdominal hysterectomy.     ROS: All systems reviewed and otherwise negative except for as above  Family History  Problem Relation Age of Onset  . Diabetes Mother   . Stroke Mother   . Hyperlipidemia Mother   . Heart disease Mother   . Hypertension Mother   . Heart attack Mother   . Cancer Father   . Heart disease Father      PVD and  CAROTID  . Heart attack Father   . Deep vein thrombosis Father   . Hyperlipidemia Father   . Hypertension Father   . Diabetes Sister   . Hypertension Sister   . Heart disease Sister     Before age 46  . Hyperlipidemia Sister   . Varicose Veins Sister   . Cancer Brother   . Cancer Maternal Aunt     breast cancer   . Diabetes Maternal Grandmother   . Diabetes Paternal Grandmother     Past Medical History  Diagnosis Date  . Staph infection   . Restless leg syndrome   . Chronic mesenteric ischemia (HCC)     s/p SMA stent 06/22/13  . Coronary artery disease   . Hypertension   . Hyperlipidemia   . Heart murmur   . Asthma     "mild touch" (02/11/2015)  . Anemia   . GERD (gastroesophageal reflux disease)   . Chronic lower back pain   . Type II diabetes mellitus ([place])   . Diabetic peripheral neuropathy ([place])     "in my legs" (02/11/2015)  . Daily headache   . Arthritis     "hands" (02/25/2015)    Past Surgical History  Procedure Laterality Date  . Endarterectomy Right 07/19/2013    Procedure: ENDARTERECTOMY CAROTID-RIGHT;  Surgeon: Angelia Mould, MD;  Location: [place];  Service: Vascular;  Laterality: Right;  . Insertion of retrograde carotid stent Left  03/05/2014    Procedure: 1)Cutdown of open exposure left common carotid; 2) Retrograde left common carotid stent; 3) Repair of left common carotid artery.;  Surgeon: Elam Dutch, MD;  Location: [place];  Service: Vascular;  Laterality: Left;  . Arch aortogram N/A 02/11/2014    Procedure: ARCH AORTOGRAM;  Surgeon: Angelia Mould, MD;  Location: [facility] CATH LAB;  Service: Cardiovascular;  Laterality: N/A;  . Left heart catheterization with coronary angiogram N/A 10/03/2014    Procedure: LEFT HEART CATHETERIZATION WITH CORONARY ANGIOGRAM;  Surgeon: Burnell Blanks, MD;  Location: [facility] CATH LAB;  Service: Cardiovascular;  Laterality: N/A;  . Cataract extraction w/ intraocular lens implant Left 02/03/2015  .  Tonsillectomy  1950's  . Finger fracture surgery Left 2015    "they had to rebreak it and put pins in it"  . Fracture surgery    . Abdominal hysterectomy    . Coronary angioplasty      ? LCX stent ~ 2000 in [place], [place] with LCX stent [facility] 09/1999 for reported re-instent stenosis  . Esophagogastroduodenoscopy (egd) with propofol Left 02/13/2015    Procedure: ESOPHAGOGASTRODUODENOSCOPY (EGD) WITH PROPOFOL;  Surgeon: Ronald Lobo, MD;  Location: [place];  Service: Endoscopy;  Laterality: Left;  . Colonoscopy with propofol N/A 02/14/2015    Procedure: COLONOSCOPY WITH PROPOFOL;  Surgeon: Ronald Lobo, MD;  Location: [facility] ENDOSCOPY;  Service: Endoscopy;  Laterality: N/A;  . Laparoscopic cholecystectomy    . Givens capsule study N/A 02/27/2015    Procedure: GIVENS CAPSULE STUDY;  Surgeon: Wilford Corner, MD;  Location: [facility] ENDOSCOPY;  Service: Endoscopy;  Laterality: N/A;    Social History:  reports that she quit smoking about 2 years ago. Her smoking use included Cigarettes. She has a 20 pack-year smoking history. She has never used smokeless tobacco. She reports that she does not drink alcohol or use illicit drugs.  Allergies: No Known Allergies  Medications Prior to Admission  Medication Sig Dispense Refill  . acetaminophen (TYLENOL) 650 MG CR tablet Take 1 tablet (650 mg total) by mouth every 8 (eight) hours as needed for pain. 60 tablet 3  . atorvastatin (LIPITOR) 20 MG tablet Take 1 tablet (20 mg total) by mouth daily. 30 tablet 5  . benazepril (LOTENSIN) 10 MG tablet Take 1 tablet (10 mg total) by mouth daily. 30 tablet 5  . cephALEXin (KEFLEX) 500 MG capsule Take 1 capsule (500 mg total) by mouth 2 (two) times daily. 20 capsule 0  . clopidogrel (PLAVIX) 75 MG tablet TAKE 1 TABLET (75 MG TOTAL) BY MOUTH DAILY WITH BREAKFAST. 30 tablet 2  . cyclobenzaprine (FLEXERIL) 5 MG tablet TAKE 1 TABLET (5 MG TOTAL) BY MOUTH AT BEDTIME. 30 tablet 3  . DULoxetine (CYMBALTA) 30 MG capsule Take 1  capsule (30 mg total) by mouth daily. 30 capsule 5  . famotidine (PEPCID) 20 MG tablet TAKE 1 TABLET BY MOUTH 2 TIMES DAILY. 60 tablet 3  . ferrous sulfate 325 (65 FE) MG tablet Take 1 tablet (325 mg total) by mouth daily with breakfast. 30 tablet 3  . gabapentin (NEURONTIN) 300 MG capsule Take 1 capsule (300 mg total) by mouth 3 (three) times daily. 90 capsule 12  . glipiZIDE (GLUCOTROL) 10 MG tablet TAKE 1 TABLET BY MOUTH 2 TIMES DAILY BEFORE A MEAL. 60 tablet 2  . Insulin Glargine (LANTUS SOLOSTAR) 100 UNIT/ML Solostar Pen Inject 65 Units into the skin at bedtime. 15 mL 3  . loratadine (CLARITIN) 10 MG tablet Take 1 tablet (10  mg total) by mouth daily as needed for allergies or rhinitis. 30 tablet 11  . ondansetron (ZOFRAN ODT) 4 MG disintegrating tablet Take 1 tablet (4 mg total) by mouth every 8 (eight) hours as needed for nausea. 10 tablet 0  . oxymetazoline (AFRIN NASAL SPRAY) 0.05 % nasal spray Place 1 spray into both nostrils 2 (two) times daily as needed for congestion. 30 mL 0  . pantoprazole (PROTONIX) 40 MG tablet Take 1 tablet (40 mg total) by mouth daily. 30 tablet 11  . sitaGLIPtin-metformin (JANUMET) 50-1000 MG tablet Take 1 tablet by mouth daily. 30 tablet 2  . Vitamin D, Ergocalciferol, (DRISDOL) 50000 UNITS CAPS capsule Take 50,000 Units by mouth every 7 (seven) days. sunday    . VOLTAREN 1 % GEL APPLY 4 GRAMS TOPICALLY 4 TIMES DAILY. 100 g 0  . ACCU-CHEK AVIVA PLUS test strip USE AS INSTRUCTED 100 each 12  . glucose blood (ACCU-CHEK AVIVA) test strip 1 each by Other route 3 (three) times daily. Use as instructed 100 each 12  . Lancets (ACCU-CHEK MULTICLIX) lancets Use as instructed 100 each 12    Blood pressure 97/54, pulse 112, temperature 98.7 F (37.1 C), temperature source Oral, resp. rate 18, height 5' 3"  (1.6 m), weight 165 lb 5.5 oz (75 kg), SpO2 97 %. Physical Exam: General: pleasant, WD/WN white female who is laying in bed in moderate distress due to pain and she's  heaving and vomiting HEENT: head is normocephalic, atraumatic.  Sclera are noninjected.  PERRL.  Ears and nose without any masses or lesions.  Mouth is pink and moist Heart: regular, rate, and rhythm.  No obvious murmurs, gallops, or rubs noted.  Palpable pedal pulses bilaterally Lungs: CTAB, no wheezes, rhonchi, or rales noted.  Respiratory effort nonlabored Abd: soft, distension, central tenderness around umbilicus most severe, but pain is diffuse, few BS, well healed laparoscopic scars noted MS: all 4 extremities are symmetrical with no cyanosis, clubbing, or edema. Skin: warm and dry with no masses, lesions, or rashes.  Legs are hairless.   Psych: A&Ox3 with an appropriate affect.   Results for orders placed or performed during the hospital encounter of 10/31/2015 (from the past 48 hour(s))  CBC with Differential/Platelet     Status: Abnormal   Collection Time: 11/04/2015 11:58 AM  Result Value Ref Range   WBC 15.8 (H) 4.0 - 10.5 K/uL   RBC 3.67 (L) 3.87 - 5.11 MIL/uL   Hemoglobin 10.6 (L) 12.0 - 15.0 g/dL   HCT 31.9 (L) 36.0 - 46.0 %   MCV 86.9 78.0 - 100.0 fL   MCH 28.9 26.0 - 34.0 pg   MCHC 33.2 30.0 - 36.0 g/dL   RDW 14.5 11.5 - 15.5 %   Platelets 433 (H) 150 - 400 K/uL   Neutrophils Relative % 92 %   Neutro Abs 14.6 (H) 1.7 - 7.7 K/uL   Lymphocytes Relative 4 %   Lymphs Abs 0.6 (L) 0.7 - 4.0 K/uL   Monocytes Relative 4 %   Monocytes Absolute 0.6 0.1 - 1.0 K/uL   Eosinophils Relative 0 %   Eosinophils Absolute 0.0 0.0 - 0.7 K/uL   Basophils Relative 0 %   Basophils Absolute 0.0 0.0 - 0.1 K/uL  Comprehensive metabolic panel     Status: Abnormal   Collection Time: 10/18/2015 11:58 AM  Result Value Ref Range   Sodium 137 135 - 145 mmol/L   Potassium 4.3 3.5 - 5.1 mmol/L   Chloride 100 (L) 101 - 111  mmol/L   CO2 18 (L) 22 - 32 mmol/L   Glucose, Bld 276 (H) 65 - 99 mg/dL   BUN 15 6 - 20 mg/dL   Creatinine, Ser 0.69 0.44 - 1.00 mg/dL   Calcium 9.3 8.9 - 10.3 mg/dL   Total  Protein 7.0 6.5 - 8.1 g/dL   Albumin 3.9 3.5 - 5.0 g/dL   AST 51 (H) 15 - 41 U/L   ALT 44 14 - 54 U/L   Alkaline Phosphatase 114 38 - 126 U/L   Total Bilirubin 1.5 (H) 0.3 - 1.2 mg/dL   GFR calc non Af Amer >60 >60 mL/min   GFR calc Af Amer >60 >60 mL/min    Comment: (NOTE) The eGFR has been calculated using the CKD EPI equation. This calculation has not been validated in all clinical situations. eGFR's persistently <60 mL/min signify possible Chronic Kidney Disease.    Anion gap 19 (H) 5 - 15  Lipase, blood     Status: None   Collection Time: 10/23/2015 11:58 AM  Result Value Ref Range   Lipase 25 11 - 51 U/L  Urinalysis, Routine w reflex microscopic (not at [facility])     Status: Abnormal   Collection Time: 10/15/2015 12:46 PM  Result Value Ref Range   Color, Urine YELLOW YELLOW   APPearance CLOUDY (A) CLEAR   Specific Gravity, Urine 1.022 1.005 - 1.030   pH 5.5 5.0 - 8.0   Glucose, UA >1000 (A) NEGATIVE mg/dL   Hgb urine dipstick SMALL (A) NEGATIVE   Bilirubin Urine NEGATIVE NEGATIVE   Ketones, ur >80 (A) NEGATIVE mg/dL   Protein, ur 100 (A) NEGATIVE mg/dL   Nitrite NEGATIVE NEGATIVE   Leukocytes, UA SMALL (A) NEGATIVE  Urine microscopic-add on     Status: Abnormal   Collection Time: 11/07/2015 12:46 PM  Result Value Ref Range   Squamous Epithelial / LPF 6-30 (A) NONE SEEN   WBC, UA TOO NUMEROUS TO COUNT 0 - 5 WBC/hpf   RBC / HPF 0-5 0 - 5 RBC/hpf   Bacteria, UA RARE (A) NONE SEEN   Urine-Other YEAST PRESENT   Lactic acid, plasma     Status: Abnormal   Collection Time: 10/29/2015  4:18 PM  Result Value Ref Range   Lactic Acid, Venous 3.1 (HH) 0.5 - 2.0 mmol/L    Comment: CRITICAL RESULT CALLED TO, READ BACK BY AND VERIFIED WITH: AYERS, E. RN @1653  ON 4.12.17 BY MCCOY,N.   Lactate dehydrogenase     Status: Abnormal   Collection Time: 10/21/2015  6:36 PM  Result Value Ref Range   LDH 886 (H) 98 - 192 U/L  Lactic acid, plasma     Status: Abnormal   Collection Time: 10/27/2015   6:36 PM  Result Value Ref Range   Lactic Acid, Venous 3.0 (HH) 0.5 - 2.0 mmol/L    Comment: CRITICAL RESULT CALLED TO, READ BACK BY AND VERIFIED WITH: FELICIA AT 5597 ON 10/26/3843 BY MOSLEY,J   Hemoglobin A1c     Status: Abnormal   Collection Time: 10/19/2015  6:41 PM  Result Value Ref Range   Hgb A1c MFr Bld 10.0 (H) 4.8 - 5.6 %    Comment: (NOTE)         Pre-diabetes: 5.7 - 6.4         Diabetes: >6.4         Glycemic control for adults with diabetes: <7.0    Mean Plasma Glucose 240 mg/dL    Comment: (NOTE) Performed At:  [place], [place] [id number] Lindon Romp MD JK:[id number]   Glucose, capillary     Status: Abnormal   Collection Time: 11/08/2015  9:33 PM  Result Value Ref Range   Glucose-Capillary 254 (H) 65 - 99 mg/dL  Lactic acid, plasma     Status: Abnormal   Collection Time: 11/04/2015 10:50 PM  Result Value Ref Range   Lactic Acid, Venous 3.8 (HH) 0.5 - 2.0 mmol/L    Comment: CRITICAL RESULT CALLED TO, READ BACK BY AND VERIFIED WITHWanda Plump [facility] RN @ [phone] ON 11/08/2015 BY C DAVIS   Glucose, capillary     Status: Abnormal   Collection Time: 10/23/15  4:07 AM  Result Value Ref Range   Glucose-Capillary 200 (H) 65 - 99 mg/dL  CBC     Status: Abnormal   Collection Time: 10/23/15  5:43 AM  Result Value Ref Range   WBC 22.5 (H) 4.0 - 10.5 K/uL   RBC 3.67 (L) 3.87 - 5.11 MIL/uL   Hemoglobin 10.5 (L) 12.0 - 15.0 g/dL   HCT 32.6 (L) 36.0 - 46.0 %   MCV 88.8 78.0 - 100.0 fL   MCH 28.6 26.0 - 34.0 pg   MCHC 32.2 30.0 - 36.0 g/dL   RDW 15.0 11.5 - 15.5 %   Platelets 371 150 - 400 K/uL  Basic metabolic panel     Status: Abnormal   Collection Time: 10/23/15  5:43 AM  Result Value Ref Range   Sodium 142 135 - 145 mmol/L   Potassium 4.1 3.5 - 5.1 mmol/L   Chloride 108 101 - 111 mmol/L   CO2 19 (L) 22 - 32 mmol/L   Glucose, Bld 208 (H) 65 - 99 mg/dL   BUN 25 (H) 6 - 20 mg/dL   Creatinine, Ser 0.80 0.44 - 1.00 mg/dL   Calcium 9.3 8.9 -  10.3 mg/dL   GFR calc non Af Amer >60 >60 mL/min   GFR calc Af Amer >60 >60 mL/min    Comment: (NOTE) The eGFR has been calculated using the CKD EPI equation. This calculation has not been validated in all clinical situations. eGFR's persistently <60 mL/min signify possible Chronic Kidney Disease.    Anion gap 15 5 - 15  Lactic acid, plasma     Status: Abnormal   Collection Time: 10/23/15  5:43 AM  Result Value Ref Range   Lactic Acid, Venous 4.8 (HH) 0.5 - 2.0 mmol/L    Comment: CRITICAL RESULT CALLED TO, READ BACK BY AND VERIFIED WITHKnute Neu RN @ [phone] ON 10/23/15 BY C DAVIS    Ct Abdomen Pelvis W Contrast  10/15/2015  CLINICAL DATA:  Severe right lower quadrant pain with vomiting for 4 days. Chronic mesenteric ischemia EXAM: CT ABDOMEN AND PELVIS WITH CONTRAST TECHNIQUE: Multidetector CT imaging of the abdomen and pelvis was performed using the standard protocol following bolus administration of intravenous contrast. CONTRAST:  152m ISOVUE-300 IOPAMIDOL (ISOVUE-300) INJECTION 61% COMPARISON:  10/20/2015 FINDINGS: Lower chest and abdominal wall:  Extensive coronary atherosclerosis. Hepatobiliary: No focal liver abnormality.Cholecystectomy with normal common bile duct diameter. Pancreas: Generalized atrophy.  No inflammation or duct enlargement Spleen: Diffusely poor enhancement when compared to prior Adrenals/Urinary Tract: Negative adrenals. No hydronephrosis or stone. Unremarkable bladder. Reproductive:Hysterectomy. Stomach/Bowel: Fold thickening involving the proximal small bowel which is nonspecific. There is no signs of bowel necrosis. No appendicitis. Vascular/Lymphatic: Extensive atherosclerosis status post stenting of the celiac and SMA. Based on recent CTA, there is in stent stenosis  or occlusion of both the celiac and proximal SMA stents. Today, there is poor opacification of the celiac branches especially when compared to the distal SMA and SMA branches, despite being in the  nephrographic/portal venous phase. These vessels do opacify on the excretory phase. There is correlate poor enhancement of the spleen, especially when compared to the kidneys and prior study. Peritoneal: No ascites or pneumoperitoneum. Musculoskeletal: No acute abnormalities. These results were called by telephone at the time of interpretation on 10/21/2015 at 4:02 pm to Dr. Jola Schmidt , who verbally acknowledged these results. IMPRESSION: 1. 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 This proximal SMA stent occlusion has occurred since January 2017 CTA. Celiac branch opacification and splenic enhancement is poor/delayed, even when compared to CTA 2 days ago. 2. Proximal small bowel fold thickening could ischemic given #1, or from infectious enteritis in this patient with vomiting. 3. History of bacturia.  No signs of pyelonephritis. Electronically Signed   By: Monte Fantasia M.D.   On: 10/31/2015 16:05   Dg Abd 2 Views  10/16/2015  CLINICAL DATA:  Nausea, vomiting and diarrhea since yesterday. Generalized abdominal pain. History of diabetes. EXAM: ABDOMEN - 2 VIEW COMPARISON:  CT 10/20/2015.  Portable chest 03/03/2015. FINDINGS: The bowel gas pattern is normal. There is no free intraperitoneal air. Cholecystectomy clips and vascular stents are noted. There are no suspicious abdominal calcifications. Mild atelectasis is present at both lung bases. IMPRESSION: No active abdominal findings. Electronically Signed   By: Richardean Sale M.D.   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  CT Angio now, Talked with Dr. Anselm Pancoast, he feels they may not have time to do an IR  procedure because he thinks the SMA is fully occluded, and that she likely needs vascular/general surgical interventions ASAP.  There is evidence of jejunal thickening.  She will likely need to be transferred to [facility].    We have a call into Dr. Scot Dock who has seen her before.  She does not currently have peritonitis but is diffusely tender.  This could develop into an acute abdomen.  Her WBC is 22.5 and Lactate is 4.8.   2.  NPO, bowel rest, IVF, pain control, antiemetics 3.  SCD's and hold plavix (she may not have received any dosage of her plavix because she thinks she threw up anytime she took her home meds since Saturday). 4.  Discussed with Dr. Coralyn Pear who will arrange for her to be transferred to the hospitalist service at [facility], [facility] Surgery 10/23/2015, 7:19 AM Pager: [phone] (7am - 4:30pm M-F; 7am - 11:30am Sa/Su)  Agree with above. CT angio this AM shows occlusion of celiac with apparent infarction of part of the right lobe of the liver. This may explain in part her worsening abdominal pain and increased lactic acid.   She does have edema of her proximal small bowel. I have spoken to Dr. Kellie Simmering (and Dr. Scot Dock, who is on call), who has spoken to Dr. Kathlene Cote at [facility].  The plan is transfer to [facility], Dr. Coralyn Pear is arranging this.  Patient going to [place] --->  Dr. Kathlene Cote to try  to open up SMA/celiac. Dr. Redmond Pulling, my counterpart at [facility] is aware of patient and will follow from general surgery standpoint.  I have spoken to the sister, Ellison Carwin [phone]), about the findings and poor prognosis.  I have spent over an hour talking to consults and organizing patient care.  Alphonsa Overall, MD, [facility] Surgery Pager: [phone] Office phone:  [phone]

## 2015-10-23 NOTE — Sedation Documentation (Signed)
Mesenteric Arteriogram procedure ended and pt being prepped fpr central line placement

## 2015-10-23 NOTE — Sedation Documentation (Signed)
Verbal order to start levo and titrate.

## 2015-10-23 NOTE — Progress Notes (Signed)
[facility] Progress Note Patient Name: Sarah Alvarez DOB: 09-Jan-1951 MRN: [id number]   Date of Service  10/23/2015  HPI/Events of Note  Pt more awake after 1 mg of narcan. Got sinus tachy after narcan in 150s.  Groaning, starting to answer questions. On Bipap as well. 140/50, 106, 20 90%.   eICU Interventions  Team updated.  Plan for abg in 1 hr.  Still at risk for intubation.         [place] 10/23/2015, 11:00 PM

## 2015-10-23 NOTE — Progress Notes (Signed)
CRITICAL VALUE ALERT  Critical value received:  Lactic acid 4.8  Date of notification:  10/23/15  Time of notification:  0637  Critical value read back:Yes.    Nurse who received alert:  F. Milagros Loll, RN  MD notified (1st page):  Raliegh Ip Schorr  Time of first page:  [phone]  MD notified (2nd page): Aileen Fass  Time of second page: Face to face 0705  Responding MD:  Aileen Fass  Time MD responded:  [phone]

## 2015-10-23 NOTE — Progress Notes (Signed)
Patient has a silver chair necklace with a black charm, a silver ring with a green stone, and a red cell phone in the room with her. Placed jewelery in specimen cup with patient label and placed in back of room. Cell phone beside cup. Patient label put on cell phone. Richarda Blade RN

## 2015-10-23 NOTE — Progress Notes (Signed)
CRITICAL VALUE ALERT  Critical value received:  Lactic 12.5   Date of notification:  10/23/15   Time of notification:  2045  Critical value read back: yes   Nurse who received alert:  A. Darcel Smalling RN  MD notified (1st page):  Dr. De-Dios CCM made aware. No new orders at this time. Richarda Blade RN

## 2015-10-23 NOTE — Sedation Documentation (Signed)
BP better. Levo increased from 5 to 10 mcg.

## 2015-10-23 NOTE — Sedation Documentation (Signed)
Assumed care of pt

## 2015-10-24 ENCOUNTER — Telehealth: Payer: Self-pay

## 2015-10-24 ENCOUNTER — Inpatient Hospital Stay (HOSPITAL_COMMUNITY): Payer: Medicaid Other

## 2015-10-24 DIAGNOSIS — J9602 Acute respiratory failure with hypercapnia: Secondary | ICD-10-CM | POA: Insufficient documentation

## 2015-10-24 LAB — COMPREHENSIVE METABOLIC PANEL
ALK PHOS: 314 U/L — AB (ref 38–126)
ALT: 4112 U/L — AB (ref 14–54)
ANION GAP: 27 — AB (ref 5–15)
AST: >10000 U/L — ABNORMAL HIGH (ref 15–41)
Albumin: 2.2 g/dL — ABNORMAL LOW (ref 3.5–5.0)
BILIRUBIN TOTAL: 1.1 mg/dL (ref 0.3–1.2)
BUN: 28 mg/dL — ABNORMAL HIGH (ref 6–20)
CALCIUM: 7.5 mg/dL — AB (ref 8.9–10.3)
CO2: 13 mmol/L — AB (ref 22–32)
CREATININE: 2.16 mg/dL — AB (ref 0.44–1.00)
Chloride: 104 mmol/L (ref 101–111)
GFR, EST AFRICAN AMERICAN: 27 mL/min — AB (ref >60)
GFR, EST NON AFRICAN AMERICAN: 23 mL/min — AB (ref >60)
Glucose, Bld: 78 mg/dL (ref 65–99)
Potassium: 4.4 mmol/L (ref 3.5–5.1)
Sodium: 144 mmol/L (ref 135–145)
TOTAL PROTEIN: 3.7 g/dL — AB (ref 6.5–8.1)

## 2015-10-24 LAB — GLUCOSE, CAPILLARY
GLUCOSE-CAPILLARY: 76 mg/dL (ref 65–99)
GLUCOSE-CAPILLARY: 29 mg/dL — AB (ref 65–99)
GLUCOSE-CAPILLARY: 34 mg/dL — AB (ref 65–99)
GLUCOSE-CAPILLARY: 94 mg/dL (ref 65–99)
Glucose-Capillary: 140 mg/dL — ABNORMAL HIGH (ref 65–99)
Glucose-Capillary: 79 mg/dL (ref 65–99)

## 2015-10-24 LAB — HEPARIN LEVEL (UNFRACTIONATED)
HEPARIN UNFRACTIONATED: 0.2 [IU]/mL — AB (ref 0.30–0.70)
Heparin Unfractionated: 0.27 IU/mL — ABNORMAL LOW (ref 0.30–0.70)

## 2015-10-24 LAB — PROCALCITONIN: Procalcitonin: 2.57 ng/mL

## 2015-10-24 LAB — URINE CULTURE: CULTURE: NO GROWTH

## 2015-10-24 LAB — POCT I-STAT 3, ART BLOOD GAS (G3+)
Acid-base deficit: 13 mmol/L — ABNORMAL HIGH (ref 0.0–2.0)
Acid-base deficit: 12 mmol/L — ABNORMAL HIGH (ref 0.0–2.0)
BICARBONATE: 14.7 meq/L — AB (ref 20.0–24.0)
Bicarbonate: 15 mEq/L — ABNORMAL LOW (ref 20.0–24.0)
O2 SAT: 100 %
O2 Saturation: 99 %
PCO2 ART: 40.6 mmHg (ref 35.0–45.0)
PCO2 ART: 36.1 mmHg (ref 35.0–45.0)
PH ART: 7.214 — AB (ref 7.350–7.450)
PO2 ART: 296 mmHg — AB (ref 80.0–100.0)
PO2 ART: 154 mmHg — AB (ref 80.0–100.0)
Patient temperature: 96.3
TCO2: 16 mmol/L (ref 0–100)
TCO2: 16 mmol/L (ref 0–100)
pH, Arterial: 7.168 — CL (ref 7.350–7.450)

## 2015-10-24 LAB — TROPONIN I
TROPONIN I: 1.08 ng/mL — AB (ref <0.031)
Troponin I: 1.78 ng/mL (ref <0.031)

## 2015-10-24 LAB — CBC
HCT: 22.7 % — ABNORMAL LOW (ref 36.0–46.0)
Hemoglobin: 7 g/dL — ABNORMAL LOW (ref 12.0–15.0)
MCH: 28.5 pg (ref 26.0–34.0)
MCHC: 30.8 g/dL (ref 30.0–36.0)
MCV: 92.3 fL (ref 78.0–100.0)
PLATELETS: 250 10*3/uL (ref 150–400)
RBC: 2.46 MIL/uL — AB (ref 3.87–5.11)
RDW: 15.4 % (ref 11.5–15.5)
WBC: 23.9 10*3/uL — AB (ref 4.0–10.5)

## 2015-10-24 LAB — PREPARE RBC (CROSSMATCH)

## 2015-10-24 LAB — LACTIC ACID, PLASMA
LACTIC ACID, VENOUS: 16.2 mmol/L — AB (ref 0.5–2.0)
LACTIC ACID, VENOUS: 19.7 mmol/L — AB (ref 0.5–2.0)

## 2015-10-24 LAB — AMMONIA: Ammonia: 115 umol/L — ABNORMAL HIGH (ref 9–35)

## 2015-10-24 LAB — CORTISOL: CORTISOL PLASMA: 78.9 ug/dL

## 2015-10-24 MED ORDER — SODIUM CHLORIDE 0.9 % IV SOLN: Freq: Once | INTRAVENOUS | Status: DC

## 2015-10-24 MED ORDER — CHLORHEXIDINE GLUCONATE 0.12% ORAL RINSE (MEDLINE KIT)
15.0000 mL | Freq: Two times a day (BID) | OROMUCOSAL | Status: DC
  Administered 2015-10-24: 15 mL via OROMUCOSAL

## 2015-10-24 MED ORDER — SODIUM BICARBONATE 8.4 % IV SOLN
100.0000 meq | Freq: Once | INTRAVENOUS | Status: AC
  Administered 2015-10-24: 100 meq via INTRAVENOUS

## 2015-10-24 MED ORDER — FENTANYL CITRATE (PF) 100 MCG/2ML IJ SOLN
INTRAMUSCULAR | Status: AC
  Administered 2015-10-24: 100 ug
  Filled 2015-10-24: qty 2

## 2015-10-24 MED ORDER — ROCURONIUM BROMIDE 50 MG/5ML IV SOLN
23.0000 mg | Freq: Once | INTRAVENOUS | Status: DC
  Filled 2015-10-24: qty 2.3

## 2015-10-24 MED ORDER — FENTANYL CITRATE (PF) 100 MCG/2ML IJ SOLN
25.0000 ug | INTRAMUSCULAR | Status: DC | PRN
  Administered 2015-10-24: 25 ug via INTRAVENOUS
  Filled 2015-10-24: qty 2

## 2015-10-24 MED ORDER — ETOMIDATE 2 MG/ML IV SOLN
23.0000 mg | Freq: Once | INTRAVENOUS | Status: AC
  Administered 2015-10-24: 23 mg via INTRAVENOUS

## 2015-10-24 MED ORDER — PHENYLEPHRINE HCL 10 MG/ML IJ SOLN
30.0000 ug/min | INTRAVENOUS | Status: DC
  Administered 2015-10-24 (×3): 200 ug/min via INTRAVENOUS
  Filled 2015-10-24 (×5): qty 4

## 2015-10-24 MED ORDER — ANTISEPTIC ORAL RINSE SOLUTION (CORINZ)
7.0000 mL | Freq: Four times a day (QID) | OROMUCOSAL | Status: DC
  Administered 2015-10-24: 7 mL via OROMUCOSAL

## 2015-10-24 MED ORDER — MIDAZOLAM HCL 2 MG/2ML IJ SOLN
1.0000 mg | INTRAMUSCULAR | Status: DC | PRN
  Administered 2015-10-24: 2 mg via INTRAVENOUS
  Filled 2015-10-24: qty 2

## 2015-10-24 MED ORDER — SODIUM BICARBONATE 8.4 % IV SOLN
INTRAVENOUS | Status: AC
  Filled 2015-10-24: qty 100

## 2015-10-24 MED ORDER — DEXTROSE 50 % IV SOLN
INTRAVENOUS | Status: AC
  Administered 2015-10-24: 50 mL
  Filled 2015-10-24: qty 50

## 2015-10-24 MED ORDER — MIDAZOLAM HCL 2 MG/2ML IJ SOLN
INTRAMUSCULAR | Status: AC
Start: 2015-10-24 — End: 2015-10-24
  Filled 2015-10-24: qty 2

## 2015-10-24 MED ORDER — PIPERACILLIN-TAZOBACTAM IN DEX 2-0.25 GM/50ML IV SOLN
2.2500 g | Freq: Three times a day (TID) | INTRAVENOUS | Status: DC
  Filled 2015-10-24 (×2): qty 50

## 2015-10-24 MED ORDER — DEXTROSE 10 % IV SOLN
INTRAVENOUS | Status: DC
  Administered 2015-10-24: 10:00:00 via INTRAVENOUS

## 2015-10-24 MED ORDER — PANTOPRAZOLE SODIUM 40 MG IV SOLR
40.0000 mg | Freq: Every day | INTRAVENOUS | Status: DC
  Administered 2015-10-24: 40 mg via INTRAVENOUS
  Filled 2015-10-24: qty 40

## 2015-10-25 LAB — TYPE AND SCREEN
ABO/RH(D): O POS
Antibody Screen: NEGATIVE
UNIT DIVISION: 0

## 2015-10-26 LAB — URINE CULTURE: Special Requests: NORMAL

## 2015-10-28 ENCOUNTER — Ambulatory Visit: Payer: Medicaid Other | Admitting: Family Medicine

## 2015-10-29 LAB — CULTURE, BLOOD (ROUTINE X 2)
Culture: NO GROWTH
Culture: NO GROWTH

## 2015-11-04 ENCOUNTER — Telehealth: Payer: Self-pay

## 2015-11-04 NOTE — Telephone Encounter (Signed)
On Nov 15, 2015 I received a death certificate from [place] (original). The death certificate is for cremation. The patient is a patient of Doctor Sood. The death certificate will be taken to Pulmonary Unit @ [facility] this pm for signature. On 11/15/15 I received the death certificate back from Doctor Hoople. I got the death certificate ready and called the funeral home to let them know the death certificate is ready for pickup. I also faxed a copy per their request.

## 2015-11-10 NOTE — Progress Notes (Signed)
Time of Death: [place] Date of Death: 10-26-2015  Two RN auscultated for 1 full minute for cardiac sounds.   None were heard.    RNs:  Cassell Smiles, RN Berenice Primas, RN

## 2015-11-10 NOTE — Progress Notes (Signed)
    Subjective  -   Sarah Alvarez has continued to decline overnight with worsening acidosis and now is intubated.   Physical Exam:  Intubated and sedated Abdomen is soft Cardiovascular: Regular rate and rhythm       Assessment/Plan:   Mesenteric ischemia: The patient's recent history was detailed by Dr. Nicole Cella note from yesterday.  The patient has had a continued decline overnight despite stenting of her superior mesenteric artery.  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  Currently, she is not a candidate for abdominal exploration given her coagulopathy and acidosis.  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  We'll continue with ongoing resuscitation.  Discussions for palliative care should be initiated as I think she has a high risk of mortality.   Annamarie Major Nov 02, 2015 10:02 AM --  Filed Vitals:   Nov 02, 2015 0930 2015-11-02 0945  BP:    Pulse: 76 120  Temp:  94.1 F (34.5 C)  Resp: 27 30    Intake/Output Summary (Last 24 hours) at 2015/11/02 1002 Last data filed at Nov 02, 2015 0500  Gross per 24 hour  Intake 2619.45 ml  Output    400 ml  Net 2219.45 ml     Laboratory CBC    Component Value Date/Time   WBC 23.9* 2015-11-02 0344   HGB 7.0* 11/02/2015 0344   HCT 22.7* 02-Nov-2015 0344   PLT 250 2015/11/02 0344    BMET    Component Value Date/Time   NA 144 11-02-15 0344   K 4.4 11/02/2015 0344   CL 104 2015-11-02 0344   CO2 13* 11-02-15 0344   GLUCOSE 78 11-02-15 0344   BUN 28* 11/02/15 0344   CREATININE 2.16* 11-02-15 0344   CREATININE 0.69 07/31/2015 1409   CALCIUM 7.5* 2015-11-02 0344   GFRNONAA 23* Nov 02, 2015 0344   GFRNONAA >89 07/31/2015 1409   GFRAA 27* 2015/11/02 0344   GFRAA >89 07/31/2015 1409    COAG Lab Results  Component Value Date   INR 2.51* 10/23/2015   INR 1.07 02/11/2015   INR 1.0 10/02/2014   No results found for:  PTT  Antibiotics Anti-infectives    Start     Dose/Rate Route Frequency Ordered Stop   10/23/15 1800  piperacillin-tazobactam (ZOSYN) IVPB 3.375 g     3.375 g 12.5 mL/hr over 240 Minutes Intravenous Every 8 hours 10/23/15 0938     10/23/15 1400  cefTRIAXone (ROCEPHIN) 1 g in dextrose 5 % 50 mL IVPB  Status:  Discontinued     1 g 100 mL/hr over 30 Minutes Intravenous Every 24 hours 10/31/2015 1758 10/23/15 0926   10/23/15 1000  piperacillin-tazobactam (ZOSYN) IVPB 3.375 g     3.375 g 100 mL/hr over 30 Minutes Intravenous  Once 10/23/15 0926     11/05/2015 1345  cefTRIAXone (ROCEPHIN) 1 g in dextrose 5 % 50 mL IVPB     1 g 100 mL/hr over 30 Minutes Intravenous  Once 10/16/2015 1336 10/13/2015 1441       V. Leia Alf, M.D. [facility] Office: [phone] Pager:  [phone]

## 2015-11-10 NOTE — Progress Notes (Signed)
   2015/11/10 1700  Clinical Encounter Type  Visited With Patient and family together;Health care provider  Visit Type Initial;Psychological support;Spiritual support;Social support;Critical Care;Death  Referral From Nurse  Consult/Referral To Faith community  Spiritual Encounters  Spiritual Needs Prayer;Ritual;Emotional;Grief support  Stress Factors  Patient Stress Factors None identified  Family Stress Factors Loss   Chaplain was paged to give the family support while they said their goodbyes to their sister. Chaplain provided emotional care via prayer.

## 2015-11-10 NOTE — Progress Notes (Signed)
Hypoglycemic Event  CBG: 34 (Central access draw)  Treatment: D50 IV 50 mL  Symptoms: None  Follow-up CBG: Time:0900 CBG Result:140  Possible Reasons for Event: Unknown  Comments/MD notified: Dr. Halford Chessman notified. D10 orders received.    Sarah Alvarez

## 2015-11-10 NOTE — Telephone Encounter (Signed)
Post ED Visit - Positive Culture Follow-up  Culture report reviewed by antimicrobial stewardship pharmacist:  []  Elenor Quinones, Pharm.D. []  Heide Guile, Pharm.D., BCPS []  Parks Neptune, Pharm.D. []  Alycia Rossetti, Pharm.D., BCPS []  [place], Pharm.D., BCPS, AAHIVP []  Legrand Como, Pharm.D., BCPS, AAHIVP []  Milus Glazier, Pharm.D. []  Stephens November, [place].D. Almyra Deforest, Pharm D Positive urine culture Treated with Cephalexin 500 mg BID, organism sensitive to the same and no further patient follow-up is required at this time.  Genia Del 11-17-15, 11:32 AM

## 2015-11-10 NOTE — Progress Notes (Signed)
CRITICAL VALUE ALERT  Critical value received:  Lactic acid 19.7  Date of notification:  2015-11-05   Time of notification:  0800  Critical value read back:Yes.    Nurse who received alert:  Arie Sabina  MD notified (1st page):  Dr.Sood at bedside  Time of first page:  0800  MD notified (2nd page):  Time of second page:  Responding MD:  Dr. Halford Chessman  Time MD responded:  0800

## 2015-11-10 NOTE — Progress Notes (Signed)
I spoke with pt's sister over the phone.  Detailed current pt status and plan of care.  Family is agreement to continue current therapy, but would not want resuscitation in the event of cardiac arrest.  DNR order placed.  Chesley Mires, MD [facility] Pulmonary/Critical Care 11-20-15, 11:55 AM Pager:  [phone] After 3pm call: [phone]

## 2015-11-10 NOTE — Consult Note (Addendum)
PULMONARY / CRITICAL CARE MEDICINE   Name: Sarah Alvarez MRN: [id number] DOB: September 28, 1950    ADMISSION DATE:  11/05/2015 CONSULTATION DATE:  10/23/2015  REFERRING MD:  Sherral Hammers  CHIEF COMPLAINT:  Abdominal pain  SUBJECTIVE:  Intubated overnight.  VITAL SIGNS: BP 120/37 mmHg  Pulse 95  Temp(Src) 97.4 F (36.3 C) (Axillary)  Resp 29  Ht 5\' 3"  (1.6 m)  Wt 165 lb 5.5 oz (75 kg)  BMI 29.30 kg/m2  SpO2 100%  HEMODYNAMICS: CVP:  [7 mmHg-10 mmHg] 7 mmHg  VENTILATOR SETTINGS: Vent Mode:  [-] PRVC FiO2 (%):  [60 %-100 %] 60 % Set Rate:  [20 bmp-30 bmp] 30 bmp Vt Set:  [420 mL] 420 mL PEEP:  [5 cmH20] 5 cmH20 Pressure Support:  [12 cmH20] 12 cmH20 Plateau Pressure:  [15 cmH20-17 cmH20] 17 cmH20  INTAKE / OUTPUT: I/O last 3 completed shifts: In: [id number] [I.V.:3452; IV Piggyback:500] Out: 402 [Urine:402]  PHYSICAL EXAMINATION: General: on vent Neuro: opens eyes with stimulation HEENT: ETT in place Cardiac: regular Chest: no wheeze Abd: tender, soft Ext: no edema Skin: no rashes  LABS:  BMET  Recent Labs Lab 10/23/15 0543 10/23/15 1906 10/23/15 1908 11-21-2015 0344  NA 142 140 141 144  K 4.1 4.4 4.7 4.4  CL 108 112* 109 104  CO2 19*  --  10* 13*  BUN 25* 28* 25* 28*  CREATININE 0.80 1.40* 1.44* 2.16*  GLUCOSE 208* 85 102* 78    Electrolytes  Recent Labs Lab 10/23/15 0543 10/23/15 1908 11/21/2015 0344  CALCIUM 9.3 8.4* 7.5*  MG  --  2.2  --   PHOS  --  7.9*  --     CBC  Recent Labs Lab 10/23/15 0543 10/23/15 1906 10/23/15 1908 11/21/2015 0344  WBC 22.5*  --  25.4* 23.9*  HGB 10.5* 9.9* 8.3* 7.0*  HCT 32.6* 29.0* 28.5* 22.7*  PLT 371  --  264 250    Coag's  Recent Labs Lab 10/23/15 0958  INR 2.51*    Sepsis Markers  Recent Labs Lab 10/23/15 1908 10/23/15 2130 10/23/15 2245 2015-11-21 0344  LATICACIDVEN 12.5*  --  15.4* 16.2*  PROCALCITON  --  6.84  --   --     ABG  Recent Labs Lab 10/23/15 2344 2015-11-21 0356 2015-11-21 0651   PHART 7.270* 7.168* 7.214*  PCO2ART 35.2 40.6 36.1  PO2ART 423.0* 296.0* 154.0*    Liver Enzymes  Recent Labs Lab 10/23/15 0958 10/23/15 1908 2015-11-21 0344  AST 9095* 8928* >10000*  ALT 4161* 3828* 4112*  ALKPHOS 258* 328* 314*  BILITOT 0.9 1.1 1.1  ALBUMIN 3.3* 2.5* 2.2*    Cardiac Enzymes  Recent Labs Lab 10/23/15 1908 Nov 21, 2015 0344  TROPONINI 0.37* 1.08*    Glucose  Recent Labs Lab 10/23/15 0407 10/23/15 1015 10/23/15 1107 10/23/15 1924 10/23/15 2342 Nov 21, 2015 0359  GLUCAP 200* 144* 124* 86 73 76    Imaging Ir Fluoro Guide Cv Line Right  10/23/2015  INDICATION: 65 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 The patient needs a central venous catheter. EXAM: FLUOROSCOPIC AND ULTRASOUND GUIDED PLACEMENT OF A CENTRAL VENOUS CATHETER Physician: Stephan Minister. Henn, MD MEDICATIONS: None ANESTHESIA/SEDATION: None FLUOROSCOPY TIME:  This fluoroscopy time was included with the mesenteric arterial intervention. See mesenteric arterial intervention report. COMPLICATIONS: None immediate. PROCEDURE: Right side of the neck was prepped and draped in sterile fashion. Maximal barrier sterile technique was utilized including caps, mask, sterile gowns, sterile gloves, sterile drape, hand hygiene and skin antiseptic. Ultrasound confirmed a patent right  internal jugular vein. The skin was anesthetized with 1% lidocaine. 90 gauge needle was directed into the right internal jugular vein with ultrasound guidance and a J wire was advanced into the SVC. Tract was dilated to accommodate a triple-lumen catheter. Catheter tip was placed at the superior cavoatrial junction. The three lumens aspirated and flushed well. Catheter was sutured to the skin. Fluoroscopic and ultrasound images were taken and saved for documentation. FINDINGS: Catheter tip at the superior cavoatrial junction. IMPRESSION: Placement of a right jugular central venous catheter with ultrasound and fluoroscopic guidance.  Electronically Signed   By: Markus Daft M.D.   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 The patient needs a central venous catheter. EXAM: FLUOROSCOPIC AND ULTRASOUND GUIDED PLACEMENT OF A CENTRAL VENOUS CATHETER Physician: Stephan Minister. Henn, MD MEDICATIONS: None ANESTHESIA/SEDATION: None FLUOROSCOPY TIME:  This fluoroscopy time was included with the mesenteric arterial intervention. See mesenteric arterial intervention report. COMPLICATIONS: None immediate. PROCEDURE: Right side of the neck was prepped and draped in sterile fashion. Maximal barrier sterile technique was utilized including caps, mask, sterile gowns, sterile gloves, sterile drape, hand hygiene and skin antiseptic. Ultrasound confirmed a patent right internal jugular vein. The skin was anesthetized with 1% lidocaine. 78 gauge needle was directed into the right internal jugular vein with ultrasound guidance and a J wire was advanced into the SVC. Tract was dilated to accommodate a triple-lumen catheter. Catheter tip was placed at the superior cavoatrial junction. The three lumens aspirated and flushed well. Catheter was sutured to the skin. Fluoroscopic and ultrasound images were taken and saved for documentation. FINDINGS: Catheter tip at the superior cavoatrial junction. IMPRESSION: Placement of a right jugular central venous catheter with ultrasound and fluoroscopic guidance. Electronically Signed   By: Markus Daft M.D.   On: 10/23/2015 19:33   Dg Chest Port 1 View  11-14-2015  CLINICAL DATA:  Respiratory failure EXAM: PORTABLE CHEST 1 VIEW COMPARISON:  03/03/2015 FINDINGS: Endotracheal tube is 2.3 cm above the carina. Right jugular central line extends into the cavoatrial junction. No pneumothorax. The lungs are clear. No large effusion. Pulmonary vasculature is normal. Heart size is normal. IMPRESSION: Support equipment appears satisfactorily positioned. Lungs are  grossly clear. Electronically Signed   By: Andreas Newport M.D.   On: 2015/11/14 06:01   Ct Angio Abd/pel W/ And/or W/o  10/23/2015  CLINICAL DATA:  65 year old with abdominal pain and known mesenteric atherosclerotic disease. Previous endovascular treatment to the celiac trunk and SMA with stent placements. Evaluate for mesenteric ischemia. EXAM: CT ANGIOGRAPHY ABDOMEN AND PELVIS TECHNIQUE: Multidetector CT imaging of the abdomen and pelvis was performed using the standard protocol during bolus administration of intravenous contrast. Multiplanar reconstructed images including MIPs were obtained and reviewed to evaluate the vascular anatomy. CONTRAST:  100 mL Omnipaque 350 COMPARISON:  10/12/2015 and 10/20/2015 FINDINGS: ARTERIAL FINDINGS: Aorta: Diffuse atherosclerotic disease involving the infrarenal abdominal aorta without aneurysm or dissection. The supraceliac aorta measures up to 2.4 cm. Celiac axis: There is a stent in the proximal celiac trunk that is occluded and there is no significant flow in the common hepatic artery or splenic artery. This is new from the exam on 10/20/2015. There is appears to be a small amount of flow in the left gastric artery. Superior mesenteric: There are stents in the proximal SMA and at the origin. SMA is occluded at the origin and there is some reconstitution near the distal aspect of  the most distal stent. Previously, the most distal stent from was widely patent. Left renal: At least moderate stenosis at the origin of the left renal artery related to calcified plaque. Left renal artery is patent. Right renal: At least moderate stenosis involving the proximal right renal artery due to calcified plaque. Right renal artery is patent. Inferior mesenteric: Proximal IMA is patent although there is probably aortic mural thrombus or plaque near the origin. Left iliac: Mixed plaque in left iliac arteries. Mild stenosis in the proximal left common iliac artery. Mild stenosis in the  left internal iliac artery. Left external iliac artery is patent. Proximal femoral arteries are patent. Right iliac: Mixed plaque in the right common iliac artery with mild stenosis. Mild stenosis in the right external iliac artery due to plaque. Proximal right femoral arteries are patent. Poor opacification of proximal femoral arteries due to the phase of imaging. Venous findings: Portal veins are patent. No significant opacification of the splenic vein due to the compromised arterial flow. Mixing of contrast and blood in the SMV. Review of the MIP images confirms the above findings. NONVASCULAR FINDINGS: Lung bases are clear. Small amount of atelectasis in the right middle lobe. No pleural effusions. There is a marked change in the appearance of the right hepatic lobe. There are now large areas of low-density involving the right hepatic lobe which extend to the capsule. Based on the arterial findings, these changes are concerning for ischemia in the right hepatic lobe. Normal appearance of the left hepatic lobe. There is no significant enhancement in the spleen even on the delayed images. No gross abnormality to the pancreas. Mild fullness and stranding involving the left adrenal gland. Normal appearance of the right adrenal gland. Normal appearance of both kidneys without evidence of stranding or infarct. Intravenous contrast in the urinary bladder. Uterus is surgically absent. Stable appearance of the left ovary. There is a small hiatal hernia. There is dilatation of the proximal jejunum without significant wall thickening. Previously, there was concern for wall thickening in this area. Fluid-filled small bowel loops throughout the abdomen. No evidence for a bowel obstruction. No gross abnormality to the appendix. No acute abnormality to the colon. There is no evidence for abdominal ascites. No significant mesenteric edema. No suspicious lymphadenopathy in the abdomen or pelvis. No suspicious bone findings.  IMPRESSION: New occlusion of the celiac trunk. No significant arterial flow in the common hepatic artery or splenic artery. Evidence for new edema in the right hepatic lobe which is suggestive for hepatic ischemia. In addition, there is no significant arterial flow or enhancement in the spleen, even on the delayed images. Occlusion of the SMA at the origin and involving the proximal SMA stents. There is distal reconstitution of the SMA near the distal stent. There appears to be increased thrombosis of the SMA stents compared to the recent comparison exam as described. Mild dilatation of the proximal jejunum. Changes of bowel ischemia cannot be excluded. However, there is no significant mesenteric edema or ascites. No evidence for bowel obstruction. This report was discussed with Dr. Alphonsa Overall on 10/23/2015 at approximately 9 a.m. Electronically Signed   By: Markus Daft M.D.   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  Developed VDRF, shock, acidosis.  Had SMA stenting by IR.  PMhx of CAD, HTN, HLD, Asthma, GERD, DM, HA, RLS.  ASSESSMENT / PLAN:  PULMONARY A: Acute respiratory failure with increased WOB due to acidosis. P:   Full vent support Adjust oxygen to keep SpO2 > 92% F/u CXR, ABG  CARDIOVASCULAR A:  Hypovolemic, septic shock. 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 Elevated troponin >> demand ischemia. Hx of CAD, HLD,  HTN, chronic diastolic CHF. P:  Wean pressors to keep MAP > 65 Continue IV fluids to keep CVP 8 to 12 Continue heparin gtt Check cortisol  RENAL A:   AKI. Anion gap metabolic acidosis. Elevated lactic acid >> from shock state and inability to clear lactate from shock liver. P:   Continue IV fluids with  HCO3 F/u BMET, ABG, lactic acid Might need nephrology evaluation  GASTROINTESTINAL A:   Shock liver 2nd to ischemia. Nutrition. P:   NPO Follow LFT Protonix for SUP Defer CT abdomen/pelvis >> d/w surgery, and results wouldn't change management at this time  HEMATOLOGIC A:   Anemia of critical illness. P:  F/u CBC Transfuse to keep Hb > 7 - will give 1 unit PRBC 4/14  INFECTIOUS A:   Sepsis likely from peritonitis - lactic acid elevated. P:   Day 2 zosyn  ENDOCRINE A:   DM 2 P:   SSI  NEUROLOGIC A:   Acute metabolic encephalopathy. P:   RASS goal: 0 Close monitoring Check ammonia level  D/w Dr. Rosendo Gros  CC time 36 minutes.  Chesley Mires, MD [facility] Pulmonary/Critical Care 2015/11/21, 8:14 AM Pager:  [phone] After 3pm call: [phone]

## 2015-11-10 NOTE — Progress Notes (Signed)
Referring Physician(s): Alphonsa Overall  Supervising Physician: Corrie Mckusick  Chief Complaint:  S/P Aortic, celiac, SMA and iliac arteriography. Mechanical thrombectomy of celiac axis and SMA. Angioplasty and stent placement in celiac axis and SMA  Subjective:  Patients condition worsened overnight. Now intubated.  One family member at bedside discussing code infomation  Allergies: Review of patient's allergies indicates no known allergies.  Medications: Prior to Admission medications   Medication Sig Start Date End Date Taking? Authorizing Provider  acetaminophen (TYLENOL) 650 MG CR tablet Take 1 tablet (650 mg total) by mouth every 8 (eight) hours as needed for pain. 03/27/15  Yes Josalyn Funches, MD  atorvastatin (LIPITOR) 20 MG tablet Take 1 tablet (20 mg total) by mouth daily. 03/27/15  Yes Josalyn Funches, MD  benazepril (LOTENSIN) 10 MG tablet Take 1 tablet (10 mg total) by mouth daily. 03/27/15  Yes Josalyn Funches, MD  cephALEXin (KEFLEX) 500 MG capsule Take 1 capsule (500 mg total) by mouth 2 (two) times daily. 10/20/15  Yes Marella Chimes, PA-C  clopidogrel (PLAVIX) 75 MG tablet TAKE 1 TABLET (75 MG TOTAL) BY MOUTH DAILY WITH BREAKFAST. 08/07/15  Yes Josalyn Funches, MD  cyclobenzaprine (FLEXERIL) 5 MG tablet TAKE 1 TABLET (5 MG TOTAL) BY MOUTH AT BEDTIME. 08/07/15  Yes Josalyn Funches, MD  DULoxetine (CYMBALTA) 30 MG capsule Take 1 capsule (30 mg total) by mouth daily. 05/27/15  Yes Josalyn Funches, MD  famotidine (PEPCID) 20 MG tablet TAKE 1 TABLET BY MOUTH 2 TIMES DAILY. 06/17/15  Yes Tresa Garter, MD  ferrous sulfate 325 (65 FE) MG tablet Take 1 tablet (325 mg total) by mouth daily with breakfast. 03/27/15  Yes Josalyn Funches, MD  gabapentin (NEURONTIN) 300 MG capsule Take 1 capsule (300 mg total) by mouth 3 (three) times daily. 03/27/15  Yes Josalyn Funches, MD  glipiZIDE (GLUCOTROL) 10 MG tablet TAKE 1 TABLET BY MOUTH 2 TIMES DAILY BEFORE A MEAL. 08/07/15   Yes Josalyn Funches, MD  Insulin Glargine (LANTUS SOLOSTAR) 100 UNIT/ML Solostar Pen Inject 65 Units into the skin at bedtime. 05/27/15  Yes Josalyn Funches, MD  loratadine (CLARITIN) 10 MG tablet Take 1 tablet (10 mg total) by mouth daily as needed for allergies or rhinitis. 03/27/15  Yes Josalyn Funches, MD  ondansetron (ZOFRAN ODT) 4 MG disintegrating tablet Take 1 tablet (4 mg total) by mouth every 8 (eight) hours as needed for nausea. 10/20/15  Yes Marella Chimes, PA-C  oxymetazoline (AFRIN NASAL SPRAY) 0.05 % nasal spray Place 1 spray into both nostrils 2 (two) times daily as needed for congestion. 01/22/14  Yes Deepak Advani, MD  pantoprazole (PROTONIX) 40 MG tablet Take 1 tablet (40 mg total) by mouth daily. 03/27/15  Yes Josalyn Funches, MD  sitaGLIPtin-metformin (JANUMET) 50-1000 MG tablet Take 1 tablet by mouth daily. 05/27/15  Yes Josalyn Funches, MD  Vitamin D, Ergocalciferol, (DRISDOL) 50000 UNITS CAPS capsule Take 50,000 Units by mouth every 7 (seven) days. sunday   Yes Historical Provider, MD  VOLTAREN 1 % GEL APPLY 4 GRAMS TOPICALLY 4 TIMES DAILY. 08/07/15  Yes Josalyn Funches, MD  ACCU-CHEK AVIVA PLUS test strip USE AS INSTRUCTED 09/30/15   Tresa Garter, MD  glucose blood (ACCU-CHEK AVIVA) test strip 1 each by Other route 3 (three) times daily. Use as instructed 03/03/15   Boykin Nearing, MD  Lancets (ACCU-CHEK MULTICLIX) lancets Use as instructed 06/19/14   Lorayne Marek, MD     Vital Signs: BP 104/32 mmHg  Pulse 87  Temp(Src)  94.4 F (34.7 C) (Oral)  Resp 30  Ht 5\' 3"  (1.6 m)  Wt 165 lb 5.5 oz (75 kg)  BMI 29.30 kg/m2  SpO2 100%  Physical Exam  Intubated Sedatied Sheath has been removed and groin stick site is ok with dressing in place.  Imaging: Ct Abdomen Pelvis W Contrast  11/06/2015  CLINICAL DATA:  Severe right lower quadrant pain with vomiting for 4 days. Chronic mesenteric ischemia EXAM: CT ABDOMEN AND PELVIS WITH CONTRAST TECHNIQUE: Multidetector CT  imaging of the abdomen and pelvis was performed using the standard protocol following bolus administration of intravenous contrast. CONTRAST:  143mL ISOVUE-300 IOPAMIDOL (ISOVUE-300) INJECTION 61% COMPARISON:  10/20/2015 FINDINGS: Lower chest and abdominal wall:  Extensive coronary atherosclerosis. Hepatobiliary: No focal liver abnormality.Cholecystectomy with normal common bile duct diameter. Pancreas: Generalized atrophy.  No inflammation or duct enlargement Spleen: Diffusely poor enhancement when compared to prior Adrenals/Urinary Tract: Negative adrenals. No hydronephrosis or stone. Unremarkable bladder. Reproductive:Hysterectomy. Stomach/Bowel: Fold thickening involving the proximal small bowel which is nonspecific. There is no signs of bowel necrosis. No appendicitis. Vascular/Lymphatic: Extensive atherosclerosis status post stenting of the celiac and SMA. Based on recent CTA, there is in stent stenosis or occlusion of both the celiac and proximal SMA stents. Today, there is poor opacification of the celiac branches especially when compared to the distal SMA and SMA branches, despite being in the nephrographic/portal venous phase. These vessels do opacify on the excretory phase. There is correlate poor enhancement of the spleen, especially when compared to the kidneys and prior study. Peritoneal: No ascites or pneumoperitoneum. Musculoskeletal: No acute abnormalities. These results were called by telephone at the time of interpretation on 10/16/2015 at 4:02 pm to Dr. Jola Schmidt , who verbally acknowledged these results. IMPRESSION: 1. 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 This proximal SMA stent occlusion has occurred since January 2017 CTA. Celiac branch opacification and splenic enhancement is poor/delayed, even when compared to CTA 2 days ago. 2. Proximal small bowel fold thickening could ischemic given #1, or from infectious enteritis in this patient with vomiting. 3. History  of bacturia.  No signs of pyelonephritis. Electronically Signed   By: Monte Fantasia M.D.   On: 11/08/2015 16:05   Ir Fluoro Guide Cv Line Right  10/23/2015  INDICATION: 65 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 The patient needs a central venous catheter. EXAM: FLUOROSCOPIC AND ULTRASOUND GUIDED PLACEMENT OF A CENTRAL VENOUS CATHETER Physician: Stephan Minister. Henn, MD MEDICATIONS: None ANESTHESIA/SEDATION: None FLUOROSCOPY TIME:  This fluoroscopy time was included with the mesenteric arterial intervention. See mesenteric arterial intervention report. COMPLICATIONS: None immediate. PROCEDURE: Right side of the neck was prepped and draped in sterile fashion. Maximal barrier sterile technique was utilized including caps, mask, sterile gowns, sterile gloves, sterile drape, hand hygiene and skin antiseptic. Ultrasound confirmed a patent right internal jugular vein. The skin was anesthetized with 1% lidocaine. 80 gauge needle was directed into the right internal jugular vein with ultrasound guidance and a J wire was advanced into the SVC. Tract was dilated to accommodate a triple-lumen catheter. Catheter tip was placed at the superior cavoatrial junction. The three lumens aspirated and flushed well. Catheter was sutured to the skin. Fluoroscopic and ultrasound images were taken and saved for documentation. FINDINGS: Catheter tip at the superior cavoatrial junction. IMPRESSION: Placement of a right jugular central venous catheter with ultrasound and fluoroscopic guidance. Electronically Signed   By: Markus Daft M.D.   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 The patient needs a central venous catheter. EXAM: FLUOROSCOPIC AND ULTRASOUND GUIDED PLACEMENT OF A CENTRAL VENOUS CATHETER Physician: Stephan Minister. Henn, MD MEDICATIONS: None ANESTHESIA/SEDATION: None FLUOROSCOPY TIME:  This fluoroscopy time was included with the mesenteric  arterial intervention. See mesenteric arterial intervention report. COMPLICATIONS: None immediate. PROCEDURE: Right side of the neck was prepped and draped in sterile fashion. Maximal barrier sterile technique was utilized including caps, mask, sterile gowns, sterile gloves, sterile drape, hand hygiene and skin antiseptic. Ultrasound confirmed a patent right internal jugular vein. The skin was anesthetized with 1% lidocaine. 7 gauge needle was directed into the right internal jugular vein with ultrasound guidance and a J wire was advanced into the SVC. Tract was dilated to accommodate a triple-lumen catheter. Catheter tip was placed at the superior cavoatrial junction. The three lumens aspirated and flushed well. Catheter was sutured to the skin. Fluoroscopic and ultrasound images were taken and saved for documentation. FINDINGS: Catheter tip at the superior cavoatrial junction. IMPRESSION: Placement of a right jugular central venous catheter with ultrasound and fluoroscopic guidance. Electronically Signed   By: Markus Daft M.D.   On: 10/23/2015 19:33   Dg Chest Port 1 View  11/20/15  CLINICAL DATA:  Respiratory failure EXAM: PORTABLE CHEST 1 VIEW COMPARISON:  03/03/2015 FINDINGS: Endotracheal tube is 2.3 cm above the carina. Right jugular central line extends into the cavoatrial junction. No pneumothorax. The lungs are clear. No large effusion. Pulmonary vasculature is normal. Heart size is normal. IMPRESSION: Support equipment appears satisfactorily positioned. Lungs are grossly clear. Electronically Signed   By: Andreas Newport M.D.   On: 11-20-15 06:01   Dg Abd 2 Views  10/17/2015  CLINICAL DATA:  Nausea, vomiting and diarrhea since yesterday. Generalized abdominal pain. History of diabetes. EXAM: ABDOMEN - 2 VIEW COMPARISON:  CT 10/20/2015.  Portable chest 03/03/2015. FINDINGS: The bowel gas pattern is normal. There is no free intraperitoneal air. Cholecystectomy clips and vascular stents are noted.  There are no suspicious abdominal calcifications. Mild atelectasis is present at both lung bases. IMPRESSION: No active abdominal findings. Electronically Signed   By: Richardean Sale M.D.   On: 10/28/2015 13:38   Ct Angio Abd/pel W/ And/or W/o  10/23/2015  CLINICAL DATA:  65 year old with abdominal pain and known mesenteric atherosclerotic disease. Previous endovascular treatment to the celiac trunk and SMA with stent placements. Evaluate for mesenteric ischemia. EXAM: CT ANGIOGRAPHY ABDOMEN AND PELVIS TECHNIQUE: Multidetector CT imaging of the abdomen and pelvis was performed using the standard protocol during bolus administration of intravenous contrast. Multiplanar reconstructed images including MIPs were obtained and reviewed to evaluate the vascular anatomy. CONTRAST:  100 mL Omnipaque 350 COMPARISON:  10/29/2015 and 10/20/2015 FINDINGS: ARTERIAL FINDINGS: Aorta: Diffuse atherosclerotic disease involving the infrarenal abdominal aorta without aneurysm or dissection. The supraceliac aorta measures up to 2.4 cm. Celiac axis: There is a stent in the proximal celiac trunk that is occluded and there is no significant flow in the common hepatic artery or splenic artery. This is new from the exam on 10/20/2015. There is appears to be a small amount of flow in the left gastric artery. Superior mesenteric: There are stents in the proximal SMA and at the origin. SMA is occluded at the origin and there is some reconstitution near the distal aspect of the most distal stent. Previously, the most distal stent from was widely patent. Left renal: At least moderate stenosis at the origin of the  left renal artery related to calcified plaque. Left renal artery is patent. Right renal: At least moderate stenosis involving the proximal right renal artery due to calcified plaque. Right renal artery is patent. Inferior mesenteric: Proximal IMA is patent although there is probably aortic mural thrombus or plaque near the origin.  Left iliac: Mixed plaque in left iliac arteries. Mild stenosis in the proximal left common iliac artery. Mild stenosis in the left internal iliac artery. Left external iliac artery is patent. Proximal femoral arteries are patent. Right iliac: Mixed plaque in the right common iliac artery with mild stenosis. Mild stenosis in the right external iliac artery due to plaque. Proximal right femoral arteries are patent. Poor opacification of proximal femoral arteries due to the phase of imaging. Venous findings: Portal veins are patent. No significant opacification of the splenic vein due to the compromised arterial flow. Mixing of contrast and blood in the SMV. Review of the MIP images confirms the above findings. NONVASCULAR FINDINGS: Lung bases are clear. Small amount of atelectasis in the right middle lobe. No pleural effusions. There is a marked change in the appearance of the right hepatic lobe. There are now large areas of low-density involving the right hepatic lobe which extend to the capsule. Based on the arterial findings, these changes are concerning for ischemia in the right hepatic lobe. Normal appearance of the left hepatic lobe. There is no significant enhancement in the spleen even on the delayed images. No gross abnormality to the pancreas. Mild fullness and stranding involving the left adrenal gland. Normal appearance of the right adrenal gland. Normal appearance of both kidneys without evidence of stranding or infarct. Intravenous contrast in the urinary bladder. Uterus is surgically absent. Stable appearance of the left ovary. There is a small hiatal hernia. There is dilatation of the proximal jejunum without significant wall thickening. Previously, there was concern for wall thickening in this area. Fluid-filled small bowel loops throughout the abdomen. No evidence for a bowel obstruction. No gross abnormality to the appendix. No acute abnormality to the colon. There is no evidence for abdominal  ascites. No significant mesenteric edema. No suspicious lymphadenopathy in the abdomen or pelvis. No suspicious bone findings. IMPRESSION: New occlusion of the celiac trunk. No significant arterial flow in the common hepatic artery or splenic artery. Evidence for new edema in the right hepatic lobe which is suggestive for hepatic ischemia. In addition, there is no significant arterial flow or enhancement in the spleen, even on the delayed images. Occlusion of the SMA at the origin and involving the proximal SMA stents. There is distal reconstitution of the SMA near the distal stent. There appears to be increased thrombosis of the SMA stents compared to the recent comparison exam as described. Mild dilatation of the proximal jejunum. Changes of bowel ischemia cannot be excluded. However, there is no significant mesenteric edema or ascites. No evidence for bowel obstruction. This report was discussed with Dr. Alphonsa Overall on 10/23/2015 at approximately 9 a.m. Electronically Signed   By: Markus Daft M.D.   On: 10/23/2015 11:15   Ct Angio Abd/pel W/ And/or W/o  10/20/2015  CLINICAL DATA:  Acute onset of nausea vomiting and diarrhea. Upper abdominal pain. Hyperglycemia. Initial encounter. EXAM: CTA ABDOMEN AND PELVIS wITHOUT AND WITH CONTRAST TECHNIQUE: Multidetector CT imaging of the abdomen and pelvis was performed using the standard protocol during bolus administration of intravenous contrast. Multiplanar reconstructed images and MIPs were obtained and reviewed to evaluate the vascular anatomy. CONTRAST:  100 mL of  Isovue 370 IV contrast COMPARISON:  CT of the abdomen and pelvis from 08/06/2015 FINDINGS: The visualized lung bases are clear. The liver and spleen are unremarkable in appearance. The patient is status post cholecystectomy, with clips noted at the gallbladder fossa. The pancreas and adrenal glands are unremarkable. The kidneys are unremarkable in appearance. There is no evidence of hydronephrosis. No  renal or ureteral stones are seen. No perinephric stranding is appreciated. No free fluid is identified. The small bowel is unremarkable in appearance. The stomach is within normal limits. No acute vascular abnormalities are seen. Scattered calcification is noted along the abdominal aorta and its branches. Vascular stents are noted at the celiac trunk and superior mesenteric artery. The appendix is normal in caliber, without evidence of appendicitis. The bladder is moderately distended and grossly unremarkable. The left ovary is grossly unremarkable. No suspicious adnexal masses are seen. No inguinal lymphadenopathy is seen. No acute osseous abnormalities are identified. Review of the MIP images confirms the above findings. IMPRESSION: 1. No acute abnormality seen within the abdomen or pelvis. 2. Scattered calcification along the abdominal aorta and its branches. Electronically Signed   By: Garald Balding M.D.   On: 10/20/2015 21:53    Labs:  CBC:  Recent Labs  10/27/2015 1158 10/23/15 0543 10/23/15 1906 10/23/15 1908 08-Nov-2015 0344  WBC 15.8* 22.5*  --  25.4* 23.9*  HGB 10.6* 10.5* 9.9* 8.3* 7.0*  HCT 31.9* 32.6* 29.0* 28.5* 22.7*  PLT 433* 371  --  264 250    COAGS:  Recent Labs  02/11/15 1857 10/23/15 0958  INR 1.07 2.51*    BMP:  Recent Labs  10/21/2015 1158 10/23/15 0543 10/23/15 1906 10/23/15 1908 11-08-15 0344  NA 137 142 140 141 144  K 4.3 4.1 4.4 4.7 4.4  CL 100* 108 112* 109 104  CO2 18* 19*  --  10* 13*  GLUCOSE 276* 208* 85 102* 78  BUN 15 25* 28* 25* 28*  CALCIUM 9.3 9.3  --  8.4* 7.5*  CREATININE 0.69 0.80 1.40* 1.44* 2.16*  GFRNONAA >60 >60  --  37* 23*  GFRAA >60 >60  --  43* 27*    LIVER FUNCTION TESTS:  Recent Labs  11/01/2015 1158 10/23/15 0958 10/23/15 1908 2015/11/08 0344  BILITOT 1.5* 0.9 1.1 1.1  AST 51* 9095* 8928* >10000*  ALT 44 4161* 3828* 4112*  ALKPHOS 114 258* 328* 314*  PROT 7.0 5.9* 4.4* 3.7*  ALBUMIN 3.9 3.3* 2.5* 2.2*     Assessment and Plan:  Worsening abd pain secondary to occlusion of the celiac trunk.  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  Dr. Trula Slade and Dr. Collie Siad notes seen and agree that discussions for palliative care should be initiated as I think she has a high risk of mortality.   Electronically Signed: Murrell Redden PA-C 11-08-15, 1:10 PM   I spent a total of 15 Minutes at the the patient's bedside AND on the patient's hospital floor or unit, greater than 50% of which was counseling/coordinating care for f/u after mesenteric angioplasty.     ]

## 2015-11-10 NOTE — Discharge Summary (Signed)
Sarah Alvarez was a 65 y.o. female admitted on 11/08/2015 with abdominal pain.  She had CT abdomen and pelvis which showed occlusion celiac trunk, no flow to common hepatic or splenic arteries, edema Rt hepatic lobe, SMA occlusion.  She was seen by general surgery, vascular surgery and IR.  She had mechanical thrombectomy of celiac axis/SMA by IR with angioplasty and stenting.  She required intubation due to progressive acidosis and altered mental status with increased work of breathing.  [name] family was updated, and they were in agreement that she would not want cardiac resuscitation in the event of cardiac arrest.  DNR order placed.  Her clinical status got progressively worse, and then she expired on 10/26/2015 at 1720.   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

## 2015-11-10 NOTE — Progress Notes (Signed)
Subjective: Pt with ETT placed overnight.     Objective: Vital signs in last 24 hours: Temp:  [97 F (36.1 C)-97.7 F (36.5 C)] 97.4 F (36.3 C) (04/14 0400) Pulse Rate:  [31-122] 90 (04/14 0715) Resp:  [13-38] 31 (04/14 0715) BP: (52-133)/(37-83) 120/37 mmHg (04/14 0715) SpO2:  [74 %-100 %] 100 % (04/14 0715) Arterial Line BP: (96-160)/(33-77) 136/40 mmHg (04/14 0700) FiO2 (%):  [60 %-100 %] 60 % (04/14 0715) Last BM Date:  (PTA)  Intake/Output from previous day: 04/13 0701 - 04/14 0700 In: 2619.5 [I.V.:2619.5] Out: 400 [Urine:400] Intake/Output this shift:    General appearance: arousable Cardio: regular rate and rhythm, S1, S2 normal, no murmur, click, rub or gallop GI: soft, ND, nttp, no rebound/guarding  Lab Results:   Recent Labs  10/23/15 1908 2015/11/06 0344  WBC 25.4* 23.9*  HGB 8.3* 7.0*  HCT 28.5* 22.7*  PLT 264 250   BMET  Recent Labs  10/23/15 1908 Nov 06, 2015 0344  NA 141 144  K 4.7 4.4  CL 109 104  CO2 10* 13*  GLUCOSE 102* 78  BUN 25* 28*  CREATININE 1.44* 2.16*  CALCIUM 8.4* 7.5*   PT/INR  Recent Labs  10/23/15 0958  LABPROT 26.8*  INR 2.51*   ABG  Recent Labs  November 06, 2015 0356 11/06/15 0651  PHART 7.168* 7.214*  HCO3 15.0* 14.7*    Studies/Results: Ct Abdomen Pelvis W Contrast  10/23/2015  CLINICAL DATA:  Severe right lower quadrant pain with vomiting for 4 days. Chronic mesenteric ischemia EXAM: CT ABDOMEN AND PELVIS WITH CONTRAST TECHNIQUE: Multidetector CT imaging of the abdomen and pelvis was performed using the standard protocol following bolus administration of intravenous contrast. CONTRAST:  153mL ISOVUE-300 IOPAMIDOL (ISOVUE-300) INJECTION 61% COMPARISON:  10/20/2015 FINDINGS: Lower chest and abdominal wall:  Extensive coronary atherosclerosis. Hepatobiliary: No focal liver abnormality.Cholecystectomy with normal common bile duct diameter. Pancreas: Generalized atrophy.  No inflammation or duct enlargement Spleen:  Diffusely poor enhancement when compared to prior Adrenals/Urinary Tract: Negative adrenals. No hydronephrosis or stone. Unremarkable bladder. Reproductive:Hysterectomy. Stomach/Bowel: Fold thickening involving the proximal small bowel which is nonspecific. There is no signs of bowel necrosis. No appendicitis. Vascular/Lymphatic: Extensive atherosclerosis status post stenting of the celiac and SMA. Based on recent CTA, there is in stent stenosis or occlusion of both the celiac and proximal SMA stents. Today, there is poor opacification of the celiac branches especially when compared to the distal SMA and SMA branches, despite being in the nephrographic/portal venous phase. These vessels do opacify on the excretory phase. There is correlate poor enhancement of the spleen, especially when compared to the kidneys and prior study. Peritoneal: No ascites or pneumoperitoneum. Musculoskeletal: No acute abnormalities. These results were called by telephone at the time of interpretation on 11/05/2015 at 4:02 pm to Dr. Jola Schmidt , who verbally acknowledged these results. IMPRESSION: 1. 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 This proximal SMA stent occlusion has occurred since January 2017 CTA. Celiac branch opacification and splenic enhancement is poor/delayed, even when compared to CTA 2 days ago. 2. Proximal small bowel fold thickening could ischemic given #1, or from infectious enteritis in this patient with vomiting. 3. History of bacturia.  No signs of pyelonephritis. Electronically Signed   By: Monte Fantasia M.D.   On: 10/13/2015 16:05   Ir Fluoro Guide Cv Line Right  10/23/2015  INDICATION: 65 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 The patient needs a central venous catheter. EXAM: FLUOROSCOPIC AND ULTRASOUND GUIDED PLACEMENT OF A  CENTRAL VENOUS CATHETER Physician: Stephan Minister. Henn, MD MEDICATIONS: None ANESTHESIA/SEDATION: None FLUOROSCOPY TIME:  This fluoroscopy time was  included with the mesenteric arterial intervention. See mesenteric arterial intervention report. COMPLICATIONS: None immediate. PROCEDURE: Right side of the neck was prepped and draped in sterile fashion. Maximal barrier sterile technique was utilized including caps, mask, sterile gowns, sterile gloves, sterile drape, hand hygiene and skin antiseptic. Ultrasound confirmed a patent right internal jugular vein. The skin was anesthetized with 1% lidocaine. 63 gauge needle was directed into the right internal jugular vein with ultrasound guidance and a J wire was advanced into the SVC. Tract was dilated to accommodate a triple-lumen catheter. Catheter tip was placed at the superior cavoatrial junction. The three lumens aspirated and flushed well. Catheter was sutured to the skin. Fluoroscopic and ultrasound images were taken and saved for documentation. FINDINGS: Catheter tip at the superior cavoatrial junction. IMPRESSION: Placement of a right jugular central venous catheter with ultrasound and fluoroscopic guidance. Electronically Signed   By: Markus Daft M.D.   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 The patient needs a central venous catheter. EXAM: FLUOROSCOPIC AND ULTRASOUND GUIDED PLACEMENT OF A CENTRAL VENOUS CATHETER Physician: Stephan Minister. Henn, MD MEDICATIONS: None ANESTHESIA/SEDATION: None FLUOROSCOPY TIME:  This fluoroscopy time was included with the mesenteric arterial intervention. See mesenteric arterial intervention report. COMPLICATIONS: None immediate. PROCEDURE: Right side of the neck was prepped and draped in sterile fashion. Maximal barrier sterile technique was utilized including caps, mask, sterile gowns, sterile gloves, sterile drape, hand hygiene and skin antiseptic. Ultrasound confirmed a patent right internal jugular vein. The skin was anesthetized with 1% lidocaine. 65 gauge needle was directed into the right  internal jugular vein with ultrasound guidance and a J wire was advanced into the SVC. Tract was dilated to accommodate a triple-lumen catheter. Catheter tip was placed at the superior cavoatrial junction. The three lumens aspirated and flushed well. Catheter was sutured to the skin. Fluoroscopic and ultrasound images were taken and saved for documentation. FINDINGS: Catheter tip at the superior cavoatrial junction. IMPRESSION: Placement of a right jugular central venous catheter with ultrasound and fluoroscopic guidance. Electronically Signed   By: Markus Daft M.D.   On: 10/23/2015 19:33   Dg Chest Port 1 View  2015-11-22  CLINICAL DATA:  Respiratory failure EXAM: PORTABLE CHEST 1 VIEW COMPARISON:  03/03/2015 FINDINGS: Endotracheal tube is 2.3 cm above the carina. Right jugular central line extends into the cavoatrial junction. No pneumothorax. The lungs are clear. No large effusion. Pulmonary vasculature is normal. Heart size is normal. IMPRESSION: Support equipment appears satisfactorily positioned. Lungs are grossly clear. Electronically Signed   By: Andreas Newport M.D.   On: 22-Nov-2015 06:01   Dg Abd 2 Views  10/14/2015  CLINICAL DATA:  Nausea, vomiting and diarrhea since yesterday. Generalized abdominal pain. History of diabetes. EXAM: ABDOMEN - 2 VIEW COMPARISON:  CT 10/20/2015.  Portable chest 03/03/2015. FINDINGS: The bowel gas pattern is normal. There is no free intraperitoneal air. Cholecystectomy clips and vascular stents are noted. There are no suspicious abdominal calcifications. Mild atelectasis is present at both lung bases. IMPRESSION: No active abdominal findings. Electronically Signed   By: Richardean Sale M.D.   On: 10/30/2015 13:38   Ct Angio Abd/pel W/ And/or W/o  10/23/2015  CLINICAL DATA:  65 year old with abdominal pain and known mesenteric atherosclerotic disease. Previous endovascular treatment to the celiac trunk and SMA with stent  placements. Evaluate for mesenteric ischemia.  EXAM: CT ANGIOGRAPHY ABDOMEN AND PELVIS TECHNIQUE: Multidetector CT imaging of the abdomen and pelvis was performed using the standard protocol during bolus administration of intravenous contrast. Multiplanar reconstructed images including MIPs were obtained and reviewed to evaluate the vascular anatomy. CONTRAST:  100 mL Omnipaque 350 COMPARISON:  10/28/2015 and 10/20/2015 FINDINGS: ARTERIAL FINDINGS: Aorta: Diffuse atherosclerotic disease involving the infrarenal abdominal aorta without aneurysm or dissection. The supraceliac aorta measures up to 2.4 cm. Celiac axis: There is a stent in the proximal celiac trunk that is occluded and there is no significant flow in the common hepatic artery or splenic artery. This is new from the exam on 10/20/2015. There is appears to be a small amount of flow in the left gastric artery. Superior mesenteric: There are stents in the proximal SMA and at the origin. SMA is occluded at the origin and there is some reconstitution near the distal aspect of the most distal stent. Previously, the most distal stent from was widely patent. Left renal: At least moderate stenosis at the origin of the left renal artery related to calcified plaque. Left renal artery is patent. Right renal: At least moderate stenosis involving the proximal right renal artery due to calcified plaque. Right renal artery is patent. Inferior mesenteric: Proximal IMA is patent although there is probably aortic mural thrombus or plaque near the origin. Left iliac: Mixed plaque in left iliac arteries. Mild stenosis in the proximal left common iliac artery. Mild stenosis in the left internal iliac artery. Left external iliac artery is patent. Proximal femoral arteries are patent. Right iliac: Mixed plaque in the right common iliac artery with mild stenosis. Mild stenosis in the right external iliac artery due to plaque. Proximal right femoral arteries are patent. Poor opacification of proximal femoral arteries due to  the phase of imaging. Venous findings: Portal veins are patent. No significant opacification of the splenic vein due to the compromised arterial flow. Mixing of contrast and blood in the SMV. Review of the MIP images confirms the above findings. NONVASCULAR FINDINGS: Lung bases are clear. Small amount of atelectasis in the right middle lobe. No pleural effusions. There is a marked change in the appearance of the right hepatic lobe. There are now large areas of low-density involving the right hepatic lobe which extend to the capsule. Based on the arterial findings, these changes are concerning for ischemia in the right hepatic lobe. Normal appearance of the left hepatic lobe. There is no significant enhancement in the spleen even on the delayed images. No gross abnormality to the pancreas. Mild fullness and stranding involving the left adrenal gland. Normal appearance of the right adrenal gland. Normal appearance of both kidneys without evidence of stranding or infarct. Intravenous contrast in the urinary bladder. Uterus is surgically absent. Stable appearance of the left ovary. There is a small hiatal hernia. There is dilatation of the proximal jejunum without significant wall thickening. Previously, there was concern for wall thickening in this area. Fluid-filled small bowel loops throughout the abdomen. No evidence for a bowel obstruction. No gross abnormality to the appendix. No acute abnormality to the colon. There is no evidence for abdominal ascites. No significant mesenteric edema. No suspicious lymphadenopathy in the abdomen or pelvis. No suspicious bone findings. IMPRESSION: New occlusion of the celiac trunk. No significant arterial flow in the common hepatic artery or splenic artery. Evidence for new edema in the right hepatic lobe which is suggestive for hepatic ischemia. In addition, there is no significant  arterial flow or enhancement in the spleen, even on the delayed images. Occlusion of the SMA at  the origin and involving the proximal SMA stents. There is distal reconstitution of the SMA near the distal stent. There appears to be increased thrombosis of the SMA stents compared to the recent comparison exam as described. Mild dilatation of the proximal jejunum. Changes of bowel ischemia cannot be excluded. However, there is no significant mesenteric edema or ascites. No evidence for bowel obstruction. This report was discussed with Dr. Alphonsa Overall on 10/23/2015 at approximately 9 a.m. Electronically Signed   By: Markus Daft M.D.   On: 10/23/2015 11:15    Anti-infectives: Anti-infectives    Start     Dose/Rate Route Frequency Ordered Stop   10/23/15 1800  piperacillin-tazobactam (ZOSYN) IVPB 3.375 g     3.375 g 12.5 mL/hr over 240 Minutes Intravenous Every 8 hours 10/23/15 0938     10/23/15 1400  cefTRIAXone (ROCEPHIN) 1 g in dextrose 5 % 50 mL IVPB  Status:  Discontinued     1 g 100 mL/hr over 30 Minutes Intravenous Every 24 hours 10/20/2015 1758 10/23/15 0926   10/23/15 1000  piperacillin-tazobactam (ZOSYN) IVPB 3.375 g     3.375 g 100 mL/hr over 30 Minutes Intravenous  Once 10/23/15 0926     11/04/2015 1345  cefTRIAXone (ROCEPHIN) 1 g in dextrose 5 % 50 mL IVPB     1 g 100 mL/hr over 30 Minutes Intravenous  Once 11/08/2015 1336 10/15/2015 1441      Assessment/Plan: Chronic mesenteric ischemia with acute occlusion of the SMA and celiac trunk Jeujunal thickening - ?ischemia Right 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  Lactate/BD likely related to liver ischemia. Will con't to follow at this time with serial abd exams.  If pt does require OR will likely need Vasc intervention as well. Will follow   LOS: 2 days    Rosario Jacks., [facility] 2015/11/08

## 2015-11-10 NOTE — Progress Notes (Signed)
ANTICOAGULATION CONSULT NOTE - Follow Up Consult  Pharmacy Consult for Heparin  Indication: Occluded SMA stent, s/p IR  No Known Allergies  Patient Measurements: Height: 5\' 3"  (160 cm) Weight: 165 lb 5.5 oz (75 kg) IBW/kg (Calculated) : 52.4  Vital Signs: Temp: 97.4 F (36.3 C) (04/14 0000) Temp Source: Axillary (04/14 0000) BP: 125/37 mmHg (04/14 0024) Pulse Rate: 96 (04/14 0024)  Labs:  Recent Labs  10/28/2015 1158 10/23/15 0543 10/23/15 0958 10/23/15 1906 10/23/15 1908 15-Nov-2015 0100  HGB 10.6* 10.5*  --  9.9* 8.3*  --   HCT 31.9* 32.6*  --  29.0* 28.5*  --   PLT 433* 371  --   --  264  --   LABPROT  --   --  26.8*  --   --   --   INR  --   --  2.51*  --   --   --   HEPARINUNFRC  --   --   --   --   --  0.20*  CREATININE 0.69 0.80  --  1.40* 1.44*  --   TROPONINI  --   --   --   --  0.37*  --     Estimated Creatinine Clearance: 38.3 mL/min (by C-G formula based on Cr of 1.44).   Assessment: Occluded SMA/celiac acix s/p re-stenting, INR 2.51 earlier today likely elevated secondary to liver dysfunction given markedly elevated LFTs.   Goal of Therapy:  Heparin level 0.3-0.7 units/ml Monitor platelets by anticoagulation protocol: Yes   Plan:  -Increase heparin to 1300 units/hr -1000 HL  Meosha Castanon 15-Nov-2015,1:51 AM

## 2015-11-10 NOTE — Progress Notes (Signed)
Initial Nutrition Assessment  DOCUMENTATION CODES:   Not applicable  INTERVENTION:   -If pt unable to extubate within 48 hours of intubation and pt/family requesting aggressive care, recommend:   Initiate Vital AF 1.2 @ 20 ml/hr  and increase by 10 ml every 4 hours to goal rate of 55 ml/hr.   Tube feeding regimen provides 1584 kcal (100% of needs), 99 grams of protein, and 1071 ml of H2O.   NUTRITION DIAGNOSIS:   Inadequate oral intake related to inability to eat as evidenced by NPO status.  GOAL:   Patient will meet greater than or equal to 90% of their needs  MONITOR:   Vent status, Weight trends, Skin, I & O's  REASON FOR ASSESSMENT:   Malnutrition Screening Tool, Ventilator    ASSESSMENT:   65 year old female admitted 4/12 with abdominal pain and SMA stenosis, worsened 4/13 with total occlusion. To IR for SMA stent and angioplasty. Post operatively to ICU on pressors. Also profound acidosis.   Patient is currently intubated on ventilator support.  MV: 13 L/min Temp (24hrs), Avg:95.9 F (35.5 C), Min:94.1 F (34.5 C), Max:97.5 F (36.4 C)  Propofol: n/a  Pt underwent SMA stenting by IR on 10/23/15. She developed VDRF, shock, and acidosis and was intubated on 11-02-2015.   Case discussed with RN. Pt with very poor prognosis and vascular surgery is recommending palliative care consult. Nutrition-focused physical exam deferred at this time.   Labs reviewed: CBGS: 29-140.  Diet Order:  Diet NPO time specified  Skin:  Reviewed, no issues  Last BM:  4/11  Height:   Ht Readings from Last 1 Encounters:  11/02/2015 5\' 3"  (1.6 m)    Weight:   Wt Readings from Last 1 Encounters:  11/05/2015 165 lb 5.5 oz (75 kg)    Ideal Body Weight:  52 kg  BMI:  Body mass index is 29.3 kg/(m^2).  Estimated Nutritional Needs:   Kcal:  1492.2  Protein:  95-105 grams  Fluid:  >1.4 L  EDUCATION NEEDS:   No education needs identified at this time  Pietro Bonura A. Jimmye Norman,  RD, LDN, CDE Pager: [phone] After hours Pager: [phone]

## 2015-11-10 NOTE — Accreditation Note (Signed)
o Restraints reported to CMS  Pursuant to regulation 482.13 (G) (3) use of restraints was logged and CMS was notified via email on 04.17.2017 at 0705 by Evette Cristal, RN, Patient Safety and Accreditation.

## 2015-11-10 NOTE — Progress Notes (Signed)
Neo-Synephrine @ 200 mcg/min, levophed @ 2 mcg/min at this time. Patient also currently receiving 519mL NS bolus. BP 110/38-40 with MAP <60. Dr. Jimmy Footman at [place] notified at this time.

## 2015-11-10 NOTE — Progress Notes (Signed)
ANTICOAGULATION CONSULT NOTE - Follow Up Consult  Pharmacy Consult for Heparin  Indication: Occluded SMA stent, s/p IR  No Known Allergies  Patient Measurements: Height: 5\' 3"  (160 cm) Weight: 165 lb 5.5 oz (75 kg) IBW/kg (Calculated) : 52.4  Vital Signs: Temp: 94.4 F (34.7 C) (04/14 1015) Temp Source: Oral (04/14 1015) BP: 120/37 mmHg (04/14 0715) Pulse Rate: 77 (04/14 1030)  Labs:  Recent Labs  10/23/15 0543 10/23/15 0958 10/23/15 1906 10/23/15 1908 10-26-2015 0100 Oct 26, 2015 0344 10/26/2015 0734 October 26, 2015 1050  HGB 10.5*  --  9.9* 8.3*  --  7.0*  --   --   HCT 32.6*  --  29.0* 28.5*  --  22.7*  --   --   PLT 371  --   --  264  --  250  --   --   LABPROT  --  26.8*  --   --   --   --   --   --   INR  --  2.51*  --   --   --   --   --   --   HEPARINUNFRC  --   --   --   --  0.20*  --   --  0.27*  CREATININE 0.80  --  1.40* 1.44*  --  2.16*  --   --   TROPONINI  --   --   --  0.37*  --  1.08* 1.78*  --     Estimated Creatinine Clearance: 25.5 mL/min (by C-G formula based on Cr of 2.16).   Assessment:  Occluded SMA/celiac acix s/p re-stenting, INR 2.51 4/13 likely elevated secondary to liver dysfunction given markedly elevated LFTs.   Heparin level 0.27 on 1300/hr. No bleeding issues per Rn discussion this am. Hgb continues to trend down to 7, 1 unit given. Will make small rate adjustment and follow up HL this evening.  Goal of Therapy:  Heparin level 0.3-0.7 units/ml Monitor platelets by anticoagulation protocol: Yes   Plan:  -Increase heparin to 1400 units/hr -1800 HL  Erin Hearing PharmD., BCPS Clinical Pharmacist Pager [phone] 26-Oct-2015 11:56 AM

## 2015-11-10 NOTE — Progress Notes (Signed)
PCCM Attending Note: Patient with worsening hypercarbic respiratory failure and acidosis despite maximal BiPAP support. I contacted patient's sister Sarah Alvarez to make her aware that with her worsening status we would recommend intubation if we were to be aggressive in her care. I also stressed that we could remove her tube and make her comfortable if she continued to decline despite our aggressive measures. She signified to go ahead and intubate the patient if necessary.   Sarah Alvarez Ashok Cordia, M.D. [facility] Pager:  [phone] After 3pm or if no response, call [phone] 5:19 AM 11-20-2015

## 2015-11-10 NOTE — Procedures (Signed)
Intubation Procedure Note ANELIE BOWDISH [id number] 07-Jun-1951  Procedure: Intubation Indications: Respiratory insufficiency  Procedure Details Consent: Unable to obtain consent because of altered level of consciousness. Time Out: Verified patient identification, verified procedure, site/side was marked, verified correct patient position, special equipment/implants available, medications/allergies/relevent history reviewed, required imaging and test results available.  Performed  Drugs:  100 mcg Fentanyl, 2 mg Versed, 10 mg Etomidate. VL x 1 with Glidescope #4 blade. Grade 1 view. 7.5 tube visualized passing through vocal cords. Following intubation:  positive color change on ETCO2, condensation seen in endotracheal tube, equal breath sounds bilaterally.  Evaluation Hemodynamic Status: BP stable throughout; O2 sats: stable throughout Patient's Current Condition: stable Complications: No apparent complications Patient did tolerate procedure well. Chest X-ray ordered to verify placement.  CXR: pending.   Montey Hora, [place] [facility] Medicine Pager: [phone]  or [phone] October 28, 2015, 5:28 AM

## 2015-11-10 NOTE — Procedures (Signed)
Extubation Procedure Note  Patient Details:   Name: Sarah Alvarez DOB: 07/29/1950 MRN: [id number]   Airway Documentation:     Evaluation  O2 sats: pt expired Complications: No apparent complications Patient expired. Bilateral Breath Sounds: Clear, Diminished   No   Pt was extubated as she expired at 1720. RN at bedside.    Jesse Sans 11-21-2015, 5:26 PM

## 2015-11-10 NOTE — Progress Notes (Signed)
[facility] Progress Note Patient Name: Sarah Alvarez DOB: Aug 04, 1950 MRN: [id number]   Date of Service  2015-11-04  HPI/Events of Note  Patient agitated, moving leg with sheath.  Also pulling at lines, etc.  eICU Interventions  Plan: Bilateral soft wirst and right ankle restraint for patient safety.     Intervention Category Major Interventions: Delirium, psychosis, severe agitation - evaluation and management  DETERDING,ELIZABETH 2015-11-04, 2:31 AM

## 2015-11-10 DEATH — deceased

## 2015-11-26 ENCOUNTER — Ambulatory Visit: Payer: Medicaid Other | Admitting: Family

## 2015-11-26 ENCOUNTER — Encounter (HOSPITAL_COMMUNITY): Payer: Medicaid Other

## 2015-12-15 ENCOUNTER — Encounter: Payer: Self-pay | Admitting: Cardiology

## 2016-07-14 ENCOUNTER — Encounter: Payer: Self-pay | Admitting: Interventional Radiology
